# Patient Record
Sex: Female | Born: 1955 | Race: White | Hispanic: No | Marital: Married | Smoking: Current some day smoker
Health system: Southern US, Community
[De-identification: ages and names within clinical notes are randomized; demographics above are authoritative.]

## PROBLEM LIST (undated history)

## (undated) DIAGNOSIS — E785 Hyperlipidemia, unspecified: Secondary | ICD-10-CM

## (undated) DIAGNOSIS — K7460 Unspecified cirrhosis of liver: Secondary | ICD-10-CM

## (undated) DIAGNOSIS — D509 Iron deficiency anemia, unspecified: Secondary | ICD-10-CM

## (undated) DIAGNOSIS — I10 Essential (primary) hypertension: Secondary | ICD-10-CM

## (undated) DIAGNOSIS — D739 Disease of spleen, unspecified: Secondary | ICD-10-CM

## (undated) DIAGNOSIS — K31819 Angiodysplasia of stomach and duodenum without bleeding: Secondary | ICD-10-CM

## (undated) DIAGNOSIS — E119 Type 2 diabetes mellitus without complications: Secondary | ICD-10-CM

## (undated) DIAGNOSIS — G629 Polyneuropathy, unspecified: Secondary | ICD-10-CM

## (undated) DIAGNOSIS — I8500 Esophageal varices without bleeding: Secondary | ICD-10-CM

## (undated) HISTORY — DX: Disease of spleen, unspecified: D73.9

## (undated) HISTORY — DX: Type 2 diabetes mellitus without complications: E11.9

## (undated) HISTORY — DX: Essential (primary) hypertension: I10

## (undated) HISTORY — DX: Esophageal varices without bleeding: I85.00

## (undated) HISTORY — DX: Unspecified cirrhosis of liver: K74.60

## (undated) HISTORY — PX: TRACHEOSTOMY: SUR1362

## (undated) HISTORY — PX: TRACHEOSTOMY CLOSURE: SHX458

## (undated) HISTORY — DX: Angiodysplasia of stomach and duodenum without bleeding: K31.819

## (undated) HISTORY — PX: RECONSTRUCTION OF NOSE: SHX2301

## (undated) HISTORY — DX: Iron deficiency anemia, unspecified: D50.9

---

## 2009-05-17 ENCOUNTER — Emergency Department (HOSPITAL_COMMUNITY): Admission: EM | Admit: 2009-05-17 | Discharge: 2009-05-17 | Payer: Self-pay | Admitting: Emergency Medicine

## 2010-10-22 LAB — POCT I-STAT, CHEM 8
BUN: 10 mg/dL (ref 6–23)
Calcium, Ion: 1.16 mmol/L (ref 1.12–1.32)
Chloride: 101 meq/L (ref 96–112)
Creatinine, Ser: 0.8 mg/dL (ref 0.4–1.2)
Glucose, Bld: 101 mg/dL — ABNORMAL HIGH (ref 70–99)
HCT: 42 % (ref 36.0–46.0)
Hemoglobin: 14.3 g/dL (ref 12.0–15.0)
Potassium: 4.2 mEq/L (ref 3.5–5.1)
Sodium: 137 meq/L (ref 135–145)
TCO2: 29 mmol/L (ref 0–100)

## 2010-10-22 LAB — CBC
HCT: 39.7 % (ref 36.0–46.0)
Hemoglobin: 13.8 g/dL (ref 12.0–15.0)
MCHC: 34.7 g/dL (ref 30.0–36.0)
Platelets: 210 10*3/uL (ref 150–400)
WBC: 9.7 10*3/uL (ref 4.0–10.5)

## 2010-10-22 LAB — POCT CARDIAC MARKERS
CKMB, poc: 2.9 ng/mL (ref 1.0–8.0)
Myoglobin, poc: 67.6 ng/mL (ref 12–200)
Troponin i, poc: <0.05 ng/mL (ref 0.00–0.09)

## 2010-10-22 LAB — D-DIMER, QUANTITATIVE: D-Dimer, Quant: 0.33 ug/mL-FEU (ref 0.00–0.48)

## 2010-10-22 LAB — DIFFERENTIAL: Eosinophils Absolute: 0.3 10*3/uL (ref 0.0–0.7)

## 2013-03-12 ENCOUNTER — Other Ambulatory Visit: Payer: Self-pay | Admitting: Nurse Practitioner

## 2013-03-12 DIAGNOSIS — R1011 Right upper quadrant pain: Secondary | ICD-10-CM

## 2013-03-12 DIAGNOSIS — R748 Abnormal levels of other serum enzymes: Secondary | ICD-10-CM

## 2013-03-20 ENCOUNTER — Ambulatory Visit
Admission: RE | Admit: 2013-03-20 | Discharge: 2013-03-20 | Disposition: A | Discharge status: Home / Self Care | Payer: BC Managed Care – PPO | Source: Ambulatory Visit | Attending: Nurse Practitioner | Admitting: Nurse Practitioner

## 2013-03-20 DIAGNOSIS — R1011 Right upper quadrant pain: Secondary | ICD-10-CM

## 2013-03-20 DIAGNOSIS — R748 Abnormal levels of other serum enzymes: Secondary | ICD-10-CM

## 2014-12-25 ENCOUNTER — Other Ambulatory Visit: Payer: Self-pay | Admitting: Internal Medicine

## 2014-12-25 DIAGNOSIS — Z1231 Encounter for screening mammogram for malignant neoplasm of breast: Secondary | ICD-10-CM

## 2015-01-07 ENCOUNTER — Ambulatory Visit
Admission: RE | Admit: 2015-01-07 | Discharge: 2015-01-07 | Disposition: A | Discharge status: Home / Self Care | Payer: 59 | Source: Ambulatory Visit | Attending: Internal Medicine | Admitting: Internal Medicine

## 2015-01-07 DIAGNOSIS — Z1231 Encounter for screening mammogram for malignant neoplasm of breast: Secondary | ICD-10-CM

## 2018-03-27 ENCOUNTER — Other Ambulatory Visit: Payer: Self-pay | Admitting: Internal Medicine

## 2018-03-27 DIAGNOSIS — Z1231 Encounter for screening mammogram for malignant neoplasm of breast: Secondary | ICD-10-CM

## 2020-08-27 ENCOUNTER — Emergency Department (HOSPITAL_COMMUNITY): Payer: 59

## 2020-08-27 ENCOUNTER — Other Ambulatory Visit: Payer: Self-pay

## 2020-08-27 ENCOUNTER — Emergency Department (HOSPITAL_COMMUNITY)
Admission: EM | Admit: 2020-08-27 | Discharge: 2020-08-27 | Disposition: A | Discharge status: Home / Self Care | Payer: 59 | Source: Home / Self Care

## 2020-08-27 ENCOUNTER — Encounter (HOSPITAL_COMMUNITY): Payer: Self-pay

## 2020-08-27 DIAGNOSIS — R0602 Shortness of breath: Secondary | ICD-10-CM | POA: Insufficient documentation

## 2020-08-27 DIAGNOSIS — Z5321 Procedure and treatment not carried out due to patient leaving prior to being seen by health care provider: Secondary | ICD-10-CM | POA: Insufficient documentation

## 2020-08-27 DIAGNOSIS — R079 Chest pain, unspecified: Secondary | ICD-10-CM | POA: Insufficient documentation

## 2020-08-27 DIAGNOSIS — D735 Infarction of spleen: Secondary | ICD-10-CM | POA: Diagnosis not present

## 2020-08-27 DIAGNOSIS — R1012 Left upper quadrant pain: Secondary | ICD-10-CM | POA: Diagnosis not present

## 2020-08-27 LAB — BASIC METABOLIC PANEL
Anion gap: 14 (ref 5–15)
BUN: 7 mg/dL — ABNORMAL LOW (ref 8–23)
CO2: 24 mmol/L (ref 22–32)
Calcium: 8.9 mg/dL (ref 8.9–10.3)
Chloride: 95 mmol/L — ABNORMAL LOW (ref 98–111)
Creatinine, Ser: 0.63 mg/dL (ref 0.44–1.00)
GFR, Estimated: >60 mL/min (ref >60)
Glucose, Bld: 123 mg/dL — ABNORMAL HIGH (ref 70–99)
Potassium: 4.3 mmol/L (ref 3.5–5.1)
Sodium: 133 mmol/L — ABNORMAL LOW (ref 135–145)

## 2020-08-27 LAB — TROPONIN I (HIGH SENSITIVITY): Troponin I (High Sensitivity): 9 ng/L (ref <18)

## 2020-08-27 LAB — CBC
HCT: 29.2 % — ABNORMAL LOW (ref 36.0–46.0)
Hemoglobin: 8.1 g/dL — ABNORMAL LOW (ref 12.0–15.0)
MCH: 21 pg — ABNORMAL LOW (ref 26.0–34.0)
MCHC: 27.7 g/dL — ABNORMAL LOW (ref 30.0–36.0)
MCV: 75.6 fL — ABNORMAL LOW (ref 80.0–100.0)
Platelets: 196 10*3/uL (ref 150–400)
RBC: 3.86 MIL/uL — ABNORMAL LOW (ref 3.87–5.11)
RDW: 18.7 % — ABNORMAL HIGH (ref 11.5–15.5)
WBC: 11.4 10*3/uL — ABNORMAL HIGH (ref 4.0–10.5)
nRBC: 0 % (ref 0.0–0.2)

## 2020-08-27 NOTE — ED Triage Notes (Signed)
Pt reports chest pain under her left breast and sob for the past 2 days. Tested negative for COVID and is fully vaccinated.  Resp e.u

## 2020-08-28 ENCOUNTER — Other Ambulatory Visit: Payer: Self-pay

## 2020-08-28 ENCOUNTER — Encounter (HOSPITAL_COMMUNITY): Payer: Self-pay | Admitting: Emergency Medicine

## 2020-08-28 ENCOUNTER — Ambulatory Visit (HOSPITAL_COMMUNITY)
Admission: EM | Admit: 2020-08-28 | Discharge: 2020-08-28 | Disposition: A | Discharge status: Home / Self Care | Payer: 59

## 2020-08-28 ENCOUNTER — Inpatient Hospital Stay (HOSPITAL_COMMUNITY)
Admission: EM | Admit: 2020-08-28 | Discharge: 2020-08-31 | DRG: 814 | Disposition: A | Discharge status: Home / Self Care | Payer: 59 | Attending: Internal Medicine | Admitting: Internal Medicine

## 2020-08-28 ENCOUNTER — Emergency Department (HOSPITAL_COMMUNITY): Payer: 59

## 2020-08-28 DIAGNOSIS — R1010 Upper abdominal pain, unspecified: Secondary | ICD-10-CM | POA: Diagnosis not present

## 2020-08-28 DIAGNOSIS — D735 Infarction of spleen: Principal | ICD-10-CM | POA: Diagnosis present

## 2020-08-28 DIAGNOSIS — R600 Localized edema: Secondary | ICD-10-CM | POA: Diagnosis present

## 2020-08-28 DIAGNOSIS — I8510 Secondary esophageal varices without bleeding: Secondary | ICD-10-CM | POA: Diagnosis present

## 2020-08-28 DIAGNOSIS — Z6837 Body mass index (BMI) 37.0-37.9, adult: Secondary | ICD-10-CM

## 2020-08-28 DIAGNOSIS — E669 Obesity, unspecified: Secondary | ICD-10-CM | POA: Diagnosis present

## 2020-08-28 DIAGNOSIS — E785 Hyperlipidemia, unspecified: Secondary | ICD-10-CM | POA: Diagnosis present

## 2020-08-28 DIAGNOSIS — Z20822 Contact with and (suspected) exposure to covid-19: Secondary | ICD-10-CM | POA: Diagnosis present

## 2020-08-28 DIAGNOSIS — R1084 Generalized abdominal pain: Secondary | ICD-10-CM | POA: Diagnosis not present

## 2020-08-28 DIAGNOSIS — K7030 Alcoholic cirrhosis of liver without ascites: Secondary | ICD-10-CM | POA: Diagnosis not present

## 2020-08-28 DIAGNOSIS — Z79899 Other long term (current) drug therapy: Secondary | ICD-10-CM

## 2020-08-28 DIAGNOSIS — R109 Unspecified abdominal pain: Secondary | ICD-10-CM

## 2020-08-28 DIAGNOSIS — J9601 Acute respiratory failure with hypoxia: Secondary | ICD-10-CM | POA: Diagnosis present

## 2020-08-28 DIAGNOSIS — G629 Polyneuropathy, unspecified: Secondary | ICD-10-CM | POA: Diagnosis present

## 2020-08-28 DIAGNOSIS — R0602 Shortness of breath: Secondary | ICD-10-CM

## 2020-08-28 DIAGNOSIS — M79662 Pain in left lower leg: Secondary | ICD-10-CM | POA: Diagnosis present

## 2020-08-28 DIAGNOSIS — D509 Iron deficiency anemia, unspecified: Secondary | ICD-10-CM | POA: Diagnosis present

## 2020-08-28 DIAGNOSIS — F17210 Nicotine dependence, cigarettes, uncomplicated: Secondary | ICD-10-CM | POA: Diagnosis present

## 2020-08-28 DIAGNOSIS — I10 Essential (primary) hypertension: Secondary | ICD-10-CM | POA: Diagnosis present

## 2020-08-28 DIAGNOSIS — R911 Solitary pulmonary nodule: Secondary | ICD-10-CM | POA: Diagnosis present

## 2020-08-28 DIAGNOSIS — K743 Primary biliary cirrhosis: Secondary | ICD-10-CM

## 2020-08-28 DIAGNOSIS — M1990 Unspecified osteoarthritis, unspecified site: Secondary | ICD-10-CM | POA: Diagnosis present

## 2020-08-28 DIAGNOSIS — K7290 Hepatic failure, unspecified without coma: Secondary | ICD-10-CM

## 2020-08-28 DIAGNOSIS — R1012 Left upper quadrant pain: Secondary | ICD-10-CM

## 2020-08-28 DIAGNOSIS — D500 Iron deficiency anemia secondary to blood loss (chronic): Secondary | ICD-10-CM | POA: Diagnosis not present

## 2020-08-28 DIAGNOSIS — K7460 Unspecified cirrhosis of liver: Secondary | ICD-10-CM | POA: Diagnosis present

## 2020-08-28 DIAGNOSIS — E8770 Fluid overload, unspecified: Secondary | ICD-10-CM | POA: Diagnosis present

## 2020-08-28 DIAGNOSIS — D649 Anemia, unspecified: Secondary | ICD-10-CM

## 2020-08-28 DIAGNOSIS — R52 Pain, unspecified: Secondary | ICD-10-CM | POA: Diagnosis not present

## 2020-08-28 HISTORY — DX: Hyperlipidemia, unspecified: E78.5

## 2020-08-28 HISTORY — DX: Polyneuropathy, unspecified: G62.9

## 2020-08-28 LAB — IRON AND TIBC
Iron: 20 ug/dL — ABNORMAL LOW (ref 28–170)
Saturation Ratios: 4 % — ABNORMAL LOW (ref 10.4–31.8)
TIBC: 466 ug/dL — ABNORMAL HIGH (ref 250–450)
UIBC: 446 ug/dL

## 2020-08-28 LAB — COMPREHENSIVE METABOLIC PANEL
ALT: 26 U/L (ref 0–44)
AST: 31 U/L (ref 15–41)
Albumin: 3.3 g/dL — ABNORMAL LOW (ref 3.5–5.0)
Alkaline Phosphatase: 148 U/L — ABNORMAL HIGH (ref 38–126)
Anion gap: 12 (ref 5–15)
BUN: 9 mg/dL (ref 8–23)
CO2: 26 mmol/L (ref 22–32)
Calcium: 9.2 mg/dL (ref 8.9–10.3)
Chloride: 98 mmol/L (ref 98–111)
Creatinine, Ser: 0.65 mg/dL (ref 0.44–1.00)
GFR, Estimated: >60 mL/min (ref >60)
Glucose, Bld: 141 mg/dL — ABNORMAL HIGH (ref 70–99)
Potassium: 3.8 mmol/L (ref 3.5–5.1)
Sodium: 136 mmol/L (ref 135–145)
Total Bilirubin: 0.8 mg/dL (ref 0.3–1.2)
Total Protein: 7 g/dL (ref 6.5–8.1)

## 2020-08-28 LAB — FERRITIN: Ferritin: 8 ng/mL — ABNORMAL LOW (ref 11–307)

## 2020-08-28 LAB — RETICULOCYTES
Immature Retic Fract: 31 % — ABNORMAL HIGH (ref 2.3–15.9)
RBC.: 3.83 MIL/uL — ABNORMAL LOW (ref 3.87–5.11)
Retic Count, Absolute: 125.8 10*3/uL (ref 19.0–186.0)
Retic Ct Pct: 3.3 % — ABNORMAL HIGH (ref 0.4–3.1)

## 2020-08-28 LAB — MONONUCLEOSIS SCREEN: Mono Screen: NEGATIVE

## 2020-08-28 LAB — FOLATE: Folate: 31.2 ng/mL (ref >5.9)

## 2020-08-28 LAB — RESP PANEL BY RT-PCR (FLU A&B, COVID) ARPGX2
Influenza A by PCR: NEGATIVE
Influenza B by PCR: NEGATIVE
SARS Coronavirus 2 by RT PCR: NEGATIVE

## 2020-08-28 LAB — CBC
HCT: 29.6 % — ABNORMAL LOW (ref 36.0–46.0)
Hemoglobin: 8 g/dL — ABNORMAL LOW (ref 12.0–15.0)
MCH: 20.7 pg — ABNORMAL LOW (ref 26.0–34.0)
MCHC: 27 g/dL — ABNORMAL LOW (ref 30.0–36.0)
MCV: 76.5 fL — ABNORMAL LOW (ref 80.0–100.0)
Platelets: 174 10*3/uL (ref 150–400)
RBC: 3.87 MIL/uL (ref 3.87–5.11)
RDW: 18.7 % — ABNORMAL HIGH (ref 11.5–15.5)
WBC: 8.6 10*3/uL (ref 4.0–10.5)
nRBC: 0 % (ref 0.0–0.2)

## 2020-08-28 LAB — VITAMIN B12: Vitamin B-12: 876 pg/mL (ref 180–914)

## 2020-08-28 LAB — TROPONIN I (HIGH SENSITIVITY)
Troponin I (High Sensitivity): 9 ng/L (ref <18)
Troponin I (High Sensitivity): 9 ng/L (ref <18)

## 2020-08-28 LAB — D-DIMER, QUANTITATIVE: D-Dimer, Quant: 2.43 ug/mL-FEU — ABNORMAL HIGH (ref 0.00–0.50)

## 2020-08-28 LAB — POC OCCULT BLOOD, ED: Fecal Occult Bld: NEGATIVE

## 2020-08-28 LAB — ABO/RH: ABO/RH(D): O POS

## 2020-08-28 LAB — BRAIN NATRIURETIC PEPTIDE: B Natriuretic Peptide: 57.4 pg/mL (ref 0.0–100.0)

## 2020-08-28 MED ORDER — IOHEXOL 300 MG/ML  SOLN
100.0000 mL | Freq: Once | INTRAMUSCULAR | Status: AC | PRN
  Administered 2020-08-28: 100 mL via INTRAVENOUS

## 2020-08-28 MED ORDER — IOHEXOL 350 MG/ML SOLN
50.0000 mL | Freq: Once | INTRAVENOUS | Status: AC | PRN
  Administered 2020-08-28: 50 mL via INTRAVENOUS

## 2020-08-28 MED ORDER — MORPHINE SULFATE (PF) 2 MG/ML IV SOLN
2.0000 mg | Freq: Once | INTRAVENOUS | Status: AC
Start: 2020-08-28 — End: 2020-08-28
  Administered 2020-08-28: 2 mg via INTRAVENOUS
  Filled 2020-08-28: qty 1

## 2020-08-28 NOTE — ED Triage Notes (Addendum)
Patient sent from Urgent Care for evaluation of possible GI bleed. Hgb 8.1 a few days ago. Patient also complains of lower extremity swelling. Patient denies having red stools and black tarry stools.

## 2020-08-28 NOTE — Discharge Instructions (Signed)
Please report to the emergency room now as I am concerned that you are having a severe gastrointestinal bleed causing a dramatic drop in your hemoglobin and hematocrit, shortness of breath and severe abdominal pain.

## 2020-08-28 NOTE — ED Triage Notes (Signed)
Pt presents with left side pain and pain under left breast xs 3-4 days. States also SOB that has been going on for " a couple of months"   Went to ED yesterday but LWBS.

## 2020-08-28 NOTE — ED Provider Notes (Signed)
[facility] EMERGENCY DEPARTMENT Provider Note   CSN: [id number] Arrival date & time: 08/28/20  1003     History Chief Complaint  Patient presents with  . Abdominal Pain    Sierra Logan is a 65 y.o. female.  HPI   Patient with significant medical history of PBC, esophagus varices, peptic duodenitis, lower limb edema presents with complaint of left upper quadrant pain and left chest pain.  Patient states symptoms started on Monday, and has stayed consistent.  She describes  intermittent pain which originally started in her left upper quadrant then radiated up into her left shoulder, she states the pain comes and goes, is worsened with movement and breathing, she denies becoming diaphoretic, nauseous, vomiting, lightheaded or dizziness.  Patient has no cardiac history, no history of DVTs or PEs, currently not on hormone therapy.  Abdominal pain is not associated with nausea or vomiting, no urinary symptoms, no constipation but does admit to diarrhea without dark tarry stools or blood in her stool.  Patient has no significant abdominal history. patient denies  alleviating factors.  Patient has headaches, fevers, chills, shortness of breath, nausea, vomiting, diarrhea, pedal edema.  Past Medical History:  Diagnosis Date  . Hyperlipemia   . Neuropathy     Patient Active Problem List   Diagnosis Date Noted  . Iron deficiency anemia 08/28/2020  . 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 Abdominal pain 08/28/2020  . Peripheral neuropathy 08/28/2020  . Cirrhosis of liver ([place]) 08/28/2020  . Lung nodule 08/28/2020    History reviewed. No pertinent surgical history.   OB History   No obstetric history on file.     History reviewed. No pertinent family history.  Social History   Tobacco Use  . Smoking status: Current Every Day Smoker  . Smokeless tobacco: Never Used  Substance Use Topics  . Alcohol use: Not Currently    Home Medications Prior to Admission  medications   Medication Sig Start Date End Date Taking? Authorizing Provider  acetaminophen (TYLENOL) 500 MG tablet Take 500-1,000 mg by mouth every 8 (eight) hours as needed for mild pain or headache.   Yes [provider]  atorvastatin (LIPITOR) 10 MG tablet Take 10 mg by mouth at bedtime. 07/16/20  Yes [provider]  BIOTIN PO Take 1 tablet by mouth daily with breakfast.   Yes [provider]  Calcium Carb-Cholecalciferol (CALCIUM+D3 PO) Take 1 tablet by mouth daily after breakfast.   Yes [provider]  furosemide (LASIX) 20 MG tablet Take 20 mg by mouth 2 (two) times daily. 08/13/20  Yes [provider]  gabapentin (NEURONTIN) 400 MG capsule Take 400-1,200 mg by mouth See admin instructions. Take 400 mg by mouth in the morning and 1,200 mg at bedtime 06/16/20  Yes [provider]  Multiple Vitamins-Calcium (ONE-A-DAY WOMENS FORMULA PO) Take 1 tablet by mouth daily with breakfast.   Yes [provider]  Omega-3 Fatty Acids (FISH OIL PO) Take 1 capsule by mouth daily with breakfast.   Yes [provider]  VITAMIN E PO Take 1 capsule by mouth daily with breakfast.   Yes [provider]    Allergies    Patient has no known allergies.  Review of Systems   Review of Systems  Constitutional: Negative for chills and fever.  HENT: Negative for congestion and sore throat.   Eyes: Negative for visual disturbance.  Respiratory: Positive for shortness of breath. Negative for cough.   Cardiovascular: Positive for chest pain.  Gastrointestinal: Positive for abdominal pain and diarrhea. Negative for blood in stool, nausea and vomiting.  Genitourinary: Negative for difficulty urinating, enuresis, vaginal bleeding and vaginal discharge.  Musculoskeletal: Negative for back pain.  Skin: Negative for rash.  Neurological: Negative for dizziness and headaches.  Hematological: Does not bruise/bleed easily.    Physical  Exam Updated Vital Signs BP (!) 142/47   Pulse (!) 101   Temp 97.9 F (36.6 C)   Resp 17   Ht $R'5\' 2"'Mc$  (1.575 m)   Wt 81.6 kg   SpO2 94%   BMI 32.92 kg/m   Physical Exam Vitals and nursing note reviewed.  Constitutional:      General: She is not in acute distress.    Appearance: She is not ill-appearing.  HENT:     Head: Normocephalic and atraumatic.     Nose: No congestion.  Eyes:     Conjunctiva/sclera: Conjunctivae normal.  Cardiovascular:     Rate and Rhythm: Normal rate and regular rhythm.     Pulses: Normal pulses.     Heart sounds: No murmur heard. No friction rub. No gallop.   Pulmonary:     Effort: No respiratory distress.     Breath sounds: No wheezing, rhonchi or rales.  Abdominal:     General: There is distension.     Palpations: Abdomen is soft.     Tenderness: There is abdominal tenderness. There is no right CVA tenderness or left CVA tenderness.     Comments: Patient abdomen is distended, normoactive bowel sounds, dull to percussion.  Patient had general abdominal tenderness, there is no rebound tenderness, no Murphy sign or McBurney point, no peritoneal sign present.  Negative CVA tenderness.  Musculoskeletal:     Right lower leg: Edema present.     Left lower leg: Edema present.     Comments: Patient has 1+ pitting edema to the shins.  Skin:    General: Skin is warm and dry.     Comments: Patient's lower extremities were visualized there is no lymph edema skin changes, no ulcers,  neurovascular fully intact  Neurological:     Mental Status: She is alert.  Psychiatric:        Mood and Affect: Mood normal.     ED Results / Procedures / Treatments   Labs (all labs ordered are listed, but only abnormal results are displayed) Labs Reviewed  COMPREHENSIVE METABOLIC PANEL - Abnormal; Notable for the following components:      Result Value   Glucose, Bld 141 (*)    Albumin 3.3 (*)    Alkaline Phosphatase 148 (*)    All other components within normal  limits  CBC - Abnormal; Notable for the following components:   Hemoglobin 8.0 (*)    HCT 29.6 (*)    MCV 76.5 (*)    MCH 20.7 (*)    MCHC 27.0 (*)    RDW 18.7 (*)    All other components within normal limits  IRON AND TIBC - Abnormal; Notable for the following components:   Iron 20 (*)    TIBC 466 (*)    Saturation Ratios 4 (*)    All other components within normal limits  FERRITIN - Abnormal; Notable for the following components:   Ferritin 8 (*)    All other components within normal limits  RETICULOCYTES - Abnormal; Notable for the following components:   Retic Ct Pct 3.3 (*)    RBC. 3.83 (*)    Immature Retic Fract 31.0 (*)  All other components within normal limits  D-DIMER, QUANTITATIVE (NOT AT [facility]) - Abnormal; Notable for the following components:   D-Dimer, Quant 2.43 (*)    All other components within normal limits  RESP PANEL BY RT-PCR (FLU A&B, COVID) ARPGX2  VITAMIN B12  FOLATE  BRAIN NATRIURETIC PEPTIDE  MONONUCLEOSIS SCREEN  HEPATITIS PANEL, ACUTE  URINALYSIS, ROUTINE W REFLEX MICROSCOPIC  PROTIME-INR  APTT  POC OCCULT BLOOD, ED  POC OCCULT BLOOD, ED  TYPE AND SCREEN  ABO/RH  TROPONIN I (HIGH SENSITIVITY)  TROPONIN I (HIGH SENSITIVITY)    EKG EKG Interpretation  Date/Time:  Thursday August 28 2020 10:13:39 EST Ventricular Rate:  96 PR Interval:  140 QRS Duration: 82 QT Interval:  374 QTC Calculation: 472 R Axis:   60 Text Interpretation: Normal sinus rhythm Nonspecific ST abnormality Confirmed by Lajean Saver [phone]) on 08/28/2020 5:02:46 PM   Radiology DG Chest 2 View  Result Date: 08/27/2020 CLINICAL DATA:  Chest pain EXAM: CHEST - 2 VIEW COMPARISON:  05/17/2009 FINDINGS: There is mild bibasilar atelectasis. No superimposed focal pulmonary infiltrate. No pneumothorax or pleural effusion. Cardiac size within normal limits. Pulmonary vascularity is normal. No acute bone abnormality. Subacute to remote left ninth rib fracture is identified  laterally. IMPRESSION: No active cardiopulmonary disease. Electronically Signed   By: Fidela Salisbury MD   On: 08/27/2020 13:19   CT Angio Chest PE W and/or Wo Contrast  Result Date: 08/28/2020 CLINICAL DATA:  65 year old female with elevated D-dimer. Concern for pulmonary embolism. EXAM: CT ANGIOGRAPHY CHEST WITH CONTRAST TECHNIQUE: Multidetector CT imaging of the chest was performed using the standard protocol during bolus administration of intravenous contrast. Multiplanar CT image reconstructions and MIPs were obtained to evaluate the vascular anatomy. CONTRAST:  67mL OMNIPAQUE IOHEXOL 350 MG/ML SOLN COMPARISON:  Chest radiograph dated 08/27/2020. FINDINGS: Cardiovascular: There is no cardiomegaly or pericardial effusion. Three-vessel coronary vascular calcification. There is calcification of the mitral annulus. Mild atherosclerotic calcification of the thoracic aorta. No aneurysmal dilatation or dissection. The origins of the great vessels of the aortic arch appear patent as visualized. Evaluation of the pulmonary arteries is limited due to respiratory motion artifact and suboptimal opacification and timing of the contrast. No definite large or central pulmonary artery embolus identified. Mediastinum/Nodes: Mild enlargement of bilateral hilar lymph nodes measuring up to 13 mm in short axis on the right. Subcarinal lymph node measures approximately 11 mm. The esophagus is grossly unremarkable. No mediastinal fluid collection. Lungs/Pleura: Minimal bibasilar dependent atelectasis. There is a 6 mm nodule in the superior segment of the right lower lobe (56/6). No focal consolidation, pleural effusion or pneumothorax. The central airways are patent. Upper Abdomen: Cirrhosis. A 3.4 x 2.7 cm ill-defined low attenuating area in the anterior spleen is not characterized but may represent a mass or a focal area of infarct. Musculoskeletal: Degenerative changes of the spine. No acute osseous pathology. Review of the MIP  images confirms the above findings. IMPRESSION: 1. No acute intrathoracic pathology. No CT evidence of central pulmonary artery embolus. 2. Cirrhosis. 3. A 3.4 x 2.7 cm ill-defined low attenuating area in the anterior spleen may represent a mass or a focal area of infarct. 4. There is a 6 mm nodule in the superior segment of the right lower lobe. Non-contrast chest CT at 6-12 months is recommended. If the nodule is stable at time of repeat CT, then future CT at 18-24 months (from today's scan) is considered optional for low-risk patients, but is recommended for high-risk patients. This  recommendation follows the consensus statement: Guidelines for Management of Incidental Pulmonary Nodules Detected on CT Images: From the Fleischner Society 2017; Radiology 2017; 284:228-243. 5. Aortic Atherosclerosis (ICD10-I70.0). Electronically Signed   By: Anner Crete M.D.   On: 08/28/2020 21:59   CT Abdomen Pelvis W Contrast  Result Date: 08/28/2020 CLINICAL DATA:  Left-sided abdominal pain over the last 3-4 days. Shortness of breath. EXAM: CT ABDOMEN AND PELVIS WITH CONTRAST TECHNIQUE: Multidetector CT imaging of the abdomen and pelvis was performed using the standard protocol following bolus administration of intravenous contrast. CONTRAST:  133mL OMNIPAQUE IOHEXOL 300 MG/ML  SOLN COMPARISON:  Ultrasound 03/20/2013 FINDINGS: Lower chest: Coronary artery calcification is noted. Globular calcification in the region of the mitral valve annulus. Heart size is normal. No pericardial effusion. There is a cluster of prominent lymph nodes the right of the heart in the pericardial fat, the largest node measuring 14 x 17 mm. There is mild atelectasis and or scarring at the left lung base and lingula. Hepatobiliary: Pronounced cirrhosis of the liver. No evidence of a mass lesion. No calcified gallstones. Pancreas: Normal Spleen: Splenomegaly. 4-5 cm hypoperfused area in the anterior superior corner of the spleen presumably  relating to a splenic infarction. This could explain the clinical presentation. Adrenals/Urinary Tract: Adrenal glands are normal. Kidneys are normal. Bladder is normal. Stomach/Bowel: The stomach and small intestine are normal. The appendix is normal. Diverticulosis of the left colon without evidence of diverticulitis. Vascular/Lymphatic: Aortic atherosclerosis. Maximal diameter of the infrarenal abdominal aorta measures 3.2 cm. The IVC is normal. Mild nodal prominence throughout the retroperitoneum and in the porta hepatis. Reproductive: No pelvic mass. Other: No free fluid or air. Musculoskeletal: Chronic degenerative changes at the L5-S1 disc space. IMPRESSION: 1. Advanced cirrhosis of the liver. Splenomegaly. 4-5 cm hypoperfused area in the anterior superior corner of the spleen presumably relating to a splenic infarction. This could explain the clinical presentation. 2. Aortic atherosclerosis. Maximal diameter of the infrarenal abdominal aorta measures 3.2 cm. Recommend follow-up ultrasound every 3 years. This recommendation follows ACR consensus guidelines: White Paper of the ACR Incidental Findings Committee II on Vascular Findings. J Am Coll Radiol 2013; 10:789-794. 3. Nodal prominence in the pericardial station, the upper abdomen and the retroperitoneum, likely secondary to lymphatic congestion associated with cirrhosis. Aortic Atherosclerosis (ICD10-I70.0). Electronically Signed   By: Nelson Chimes M.D.   On: 08/28/2020 20:11    Procedures Procedures   Medications Ordered in ED Medications  iohexol (OMNIPAQUE) 300 MG/ML solution 100 mL (100 mLs Intravenous Contrast Given 08/28/20 1957)  morphine 2 MG/ML injection 2 mg (2 mg Intravenous Given 08/28/20 2115)  iohexol (OMNIPAQUE) 350 MG/ML injection 50 mL (50 mLs Intravenous Contrast Given 08/28/20 2150)    ED Course  I have reviewed the triage vital signs and the nursing notes.  Pertinent labs & imaging results that were available during my care  of the patient were reviewed by me and considered in my medical decision making (see chart for details).    MDM Rules/Calculators/A&P                          Initial impression-patient presents with left upper quadrant pain left chest pain.  She is alert, does not appear in acute stress, vital signs reassuring.  Will obtain chest pain work-up, add on anemia panel, CT abdomen pelvis for further evaluation.  Work-up-CBC negative for leukocytosis, shows normocytic anemia at 8 decreased from baseline of 14, 11 months ago.  CMP shows hyperglycemia 141, elevated alk phos of 148, no anion gap present.  BMP 57.4, D-dimer elevated 2.43.  Hemoccult negative. CT abdomen pelvis shows advanced cirrhosis of the liver, splenomegaly there is a 4.5 cm hypodensity presumably splenic infarction.  Aortic arteriosclerosis measuring 3.2 cm recommends follow-up in 3 years for reevaluation.  EKG sinus without signs of ischemia no ST elevation depression noted.  CT angio negative for PE, does show mass in the anterior aspect of the spleen may represent mass or area of infarct.  Also shows 6 mm nodule in the superior segment of the right lung, recommends noncontrast CT next 6 to 12 months for reevaluation.  Consult due to acute splenic infarction of unclear etiology and worsening anemia will consult with hospitalist for further recommendations.  Spoke with Dr. Tonie Griffith of the hospitalist team he has agreed to accept the patient.  He will come down and assess them.  Spoke with oncology spoke with Dr. Lorenso Courier who recommends CT angio of abdomen for further evaluation, his team will come and evaluate the patient tomorrow.  Reassessment patient was reassessed, continues to have pain wwill provide her with morphine at this time.  Notify patient of findings, recommend admission for further evaluation.  Patient was agreeable to this.  Due to elevated D-dimer concern for possible PE will order CT angio for further evaluation.  Patient  has no pain at this time, vital signs remained stable.  Patient is agreeable for hospital admission.  Rule out-I have low suspicion for ACS as history is atypical, patient has no cardiac history, EKG sinus without signs of ischemia, negative delta troponins.  Low suspicion for PE as CT angio was negative.  Low suspicion for perforated stomach ulcer as abdomen is nontympanic, no peritoneal sign noted on exam, CT imaging negative for pneumoperitoneum.  Low suspicion for gallbladder abnormality  no elevation in liver enzymes, no acute findings seen on CT abdomen pelvis.  Low suspicion for UTI or pyelonephritis as patient denies urinary symptoms, no CVA tenderness on exam, no acute findings seen on CT abdomen pelvis.  Low suspicion for lower GI bleed has Hemoccult is negative.  Plan-patient has splenic infarction with unknown etiology anticipate patient will need further work-up as it is possible patient may have mesenteric infarction and need CT angio of the abdomen pelvis.  Patient care will be transferred to admitting team for further evaluation.     Final Clinical Impression(s) / ED Diagnoses Final diagnoses:  Splenic infarction  Left upper quadrant abdominal pain  Iron deficiency anemia, unspecified iron deficiency anemia type    Rx / DC Orders ED Discharge Orders    None       Marcello Fennel, PA-C 08/29/20 0002    Tegeler, Gwenyth Allegra, MD 09/02/20 0009

## 2020-08-28 NOTE — ED Notes (Signed)
Pt transported to radiology at this time °

## 2020-08-28 NOTE — ED Notes (Signed)
Pt back from CT

## 2020-08-28 NOTE — H&P (Signed)
History and Physical    Sierra Logan QQV:[id number] DOB: 05-11-1956 DOA: 08/28/2020  PCP: Patient, No Pcp Per   Patient coming from: Home  Chief Complaint: Abdominal pain  HPI: Sierra Logan is a 65 y.o. female with medical history significant for cirrhosis, esophagus varices, peptic duodenitis, lower limb edema presents with complaint of left upper quadrant pain and left chest pain.  Patient states symptoms started 4 days ago. Pain has been consistent and unchanging over the past few days. She describes  intermittent pain which originally started in her left upper quadrant then radiated up into her left shoulder, she states the pain comes and goes and is worsened with movement and deep breathing. She denies becoming diaphoretic, nauseous, vomiting, lightheaded or dizziness.  Patient has no cardiac history, no history of DVTs or Pes and is not on hormone therapy. She does smoke but has decreased from a pack a day to 6-7 cigarettes a day for the past week. Abdominal pain is not associated with nausea or vomiting, no urinary symptoms, no constipation.  She reports she has had 3 colonoscopies in her life with the last being 2 years ago in which a few benign polyps were removed.  She states she has been told she does not need to have another colonoscopy for 3 more years. Patient has not had abdominal injury or trauma.  Patient has chronic pedal edema and takes lasix. She has not had change in her medications lately. She reports she took the J&J Covid vaccine 6 months ago. She reports having low energy and gets SOB with exertion for the past year which has slowly gotten more pronounced.    Review of Systems:  General: Denies weakness, fever, chills, weight loss.  Denies dizziness.  Denies change in appetite HENT: Denies head trauma, headache, denies change in hearing, tinnitus.  Denies nasal congestion or bleeding.  Denies sore throat, sores in mouth.  Denies difficulty swallowing Eyes: Denies blurry  vision, pain in eye, drainage.  Denies discoloration of eyes. Neck: Denies pain.  Denies swelling.  Denies pain with movement. Cardiovascular: Denies chest pain, palpitations.  Denies edema.  Denies orthopnea Respiratory: Denies shortness of breath, cough.  Denies wheezing.  Denies sputum production Gastrointestinal: Reports abdominal pain. Denies nausea, vomiting, diarrhea.  Denies melena.  Denies hematemesis. Musculoskeletal: Denies limitation of movement.  Denies deformity or swelling. Denies arthralgias or myalgias. Genitourinary: Denies pelvic pain.  Denies urinary frequency or hesitancy.  Denies dysuria.  Skin: Denies rash.  Denies petechiae, purpura, ecchymosis. Neurological: Denies headache.  Denies syncope.  Denies seizure activity.  Denies weakness or paresthesia.  Denies slurred speech, drooping face.  Denies visual change. Psychiatric: Denies depression, anxiety.  Denies hallucinations.  Past Medical History:  Diagnosis Date  . Hyperlipemia   . Neuropathy     History reviewed. No pertinent surgical history.  Social History  reports that she has been smoking. She has never used smokeless tobacco. She reports previous alcohol use. No history on file for drug use.  No Known Allergies  History reviewed. No pertinent family history.   Prior to Admission medications   Medication Sig Start Date End Date Taking? Authorizing Provider  acetaminophen (TYLENOL) 500 MG tablet Take 500-1,000 mg by mouth every 8 (eight) hours as needed for mild pain or headache.   Yes [provider]  atorvastatin (LIPITOR) 10 MG tablet Take 10 mg by mouth at bedtime. 07/16/20  Yes [provider]  BIOTIN PO Take 1 tablet by mouth daily with  breakfast.   Yes [provider]  Calcium Carb-Cholecalciferol (CALCIUM+D3 PO) Take 1 tablet by mouth daily after breakfast.   Yes [provider]  furosemide (LASIX) 20 MG tablet Take 20 mg by mouth 2 (two) times daily. 08/13/20   Yes [provider]  gabapentin (NEURONTIN) 400 MG capsule Take 400-1,200 mg by mouth See admin instructions. Take 400 mg by mouth in the morning and 1,200 mg at bedtime 06/16/20  Yes [provider]  Multiple Vitamins-Calcium (ONE-A-DAY WOMENS FORMULA PO) Take 1 tablet by mouth daily with breakfast.   Yes [provider]  Omega-3 Fatty Acids (FISH OIL PO) Take 1 capsule by mouth daily with breakfast.   Yes [provider]  VITAMIN E PO Take 1 capsule by mouth daily with breakfast.   Yes [provider]    Physical Exam: Vitals:   08/28/20 1900 08/28/20 1931 08/28/20 2100 08/28/20 2115  BP: (!) 132/51  (!) 143/70 (!) 142/47  Pulse: 87  (!) 102 (!) 101  Resp: (!) 21  17 17   Temp:  98 F (36.7 C) 97.9 F (36.6 C)   TempSrc:  Oral    SpO2: 98%  96% 94%  Weight:      Height:        Constitutional: NAD, calm, comfortable Vitals:   08/28/20 1900 08/28/20 1931 08/28/20 2100 08/28/20 2115  BP: (!) 132/51  (!) 143/70 (!) 142/47  Pulse: 87  (!) 102 (!) 101  Resp: (!) 21  17 17   Temp:  98 F (36.7 C) 97.9 F (36.6 C)   TempSrc:  Oral    SpO2: 98%  96% 94%  Weight:      Height:       General: WDWN, Alert and oriented x3.  Eyes: EOMI, PERRL, conjunctivae normal.  Sclera nonicteric HENT:  /AT, external ears normal.  Nares patent without epistasis.  Mucous membranes are moist. Neck: Soft, normal range of motion, supple, no masses, no thyromegaly. Trachea midline Respiratory: clear to auscultation bilaterally, no wheezing, no crackles. Normal respiratory effort. No accessory muscle use.  Cardiovascular: Regular rate and rhythm, no murmurs / rubs / gallops. Mild pedal edema. 1+ pedal pulses. Abdomen: Soft, LUQ and central abdominal tenderness, nondistended, no rebound or guarding.  No masses palpated. Obese. Bowel sounds normoactive Musculoskeletal: FROM. no cyanosis. No joint deformity upper and lower extremities. Normal muscle tone.  Skin:  Warm, dry, intact no rashes, lesions, ulcers. No induration.  Neurologic:  CN 2-12 grossly intact. Normal speech. Normal sensation to touch in extremities. Strength 5/5 in all extremities.   Psychiatric: Normal judgment and insight.  Normal mood.    Labs on Admission: I have personally reviewed following labs and imaging studies  CBC: Recent Labs  Lab 08/27/20 1344 08/28/20 1033  WBC 11.4* 8.6  HGB 8.1* 8.0*  HCT 29.2* 29.6*  MCV 75.6* 76.5*  PLT 196 035    Basic Metabolic Panel: Recent Labs  Lab 08/27/20 1344 08/28/20 1033  NA 133* 136  K 4.3 3.8  CL 95* 98  CO2 24 26  GLUCOSE 123* 141*  BUN 7* 9  CREATININE 0.63 0.65  CALCIUM 8.9 9.2    GFR: Estimated Creatinine Clearance: 70.3 mL/min (by C-G formula based on SCr of 0.65 mg/dL).  Liver Function Tests: Recent Labs  Lab 08/28/20 1033  AST 31  ALT 26  ALKPHOS 148*  BILITOT 0.8  PROT 7.0  ALBUMIN 3.3*    Urine analysis: No results found for: COLORURINE, APPEARANCEUR, LABSPEC,  PHURINE, GLUCOSEU, HGBUR, BILIRUBINUR, KETONESUR, PROTEINUR, UROBILINOGEN, NITRITE, LEUKOCYTESUR  Radiological Exams on Admission: DG Chest 2 View  Result Date: 08/27/2020 CLINICAL DATA:  Chest pain EXAM: CHEST - 2 VIEW COMPARISON:  05/17/2009 FINDINGS: There is mild bibasilar atelectasis. No superimposed focal pulmonary infiltrate. No pneumothorax or pleural effusion. Cardiac size within normal limits. Pulmonary vascularity is normal. No acute bone abnormality. Subacute to remote left ninth rib fracture is identified laterally. IMPRESSION: No active cardiopulmonary disease. Electronically Signed   By: Fidela Salisbury MD   On: 08/27/2020 13:19   CT Angio Chest PE W and/or Wo Contrast  Result Date: 08/28/2020 CLINICAL DATA:  65 year old female with elevated D-dimer. Concern for pulmonary embolism. EXAM: CT ANGIOGRAPHY CHEST WITH CONTRAST TECHNIQUE: Multidetector CT imaging of the chest was performed using the standard protocol during bolus  administration of intravenous contrast. Multiplanar CT image reconstructions and MIPs were obtained to evaluate the vascular anatomy. CONTRAST:  62mL OMNIPAQUE IOHEXOL 350 MG/ML SOLN COMPARISON:  Chest radiograph dated 08/27/2020. FINDINGS: Cardiovascular: There is no cardiomegaly or pericardial effusion. Three-vessel coronary vascular calcification. There is calcification of the mitral annulus. Mild atherosclerotic calcification of the thoracic aorta. No aneurysmal dilatation or dissection. The origins of the great vessels of the aortic arch appear patent as visualized. Evaluation of the pulmonary arteries is limited due to respiratory motion artifact and suboptimal opacification and timing of the contrast. No definite large or central pulmonary artery embolus identified. Mediastinum/Nodes: Mild enlargement of bilateral hilar lymph nodes measuring up to 13 mm in short axis on the right. Subcarinal lymph node measures approximately 11 mm. The esophagus is grossly unremarkable. No mediastinal fluid collection. Lungs/Pleura: Minimal bibasilar dependent atelectasis. There is a 6 mm nodule in the superior segment of the right lower lobe (56/6). No focal consolidation, pleural effusion or pneumothorax. The central airways are patent. Upper Abdomen: Cirrhosis. A 3.4 x 2.7 cm ill-defined low attenuating area in the anterior spleen is not characterized but may represent a mass or a focal area of infarct. Musculoskeletal: Degenerative changes of the spine. No acute osseous pathology. Review of the MIP images confirms the above findings. IMPRESSION: 1. No acute intrathoracic pathology. No CT evidence of central pulmonary artery embolus. 2. Cirrhosis. 3. A 3.4 x 2.7 cm ill-defined low attenuating area in the anterior spleen may represent a mass or a focal area of infarct. 4. There is a 6 mm nodule in the superior segment of the right lower lobe. Non-contrast chest CT at 6-12 months is recommended. If the nodule is stable at  time of repeat CT, then future CT at 18-24 months (from today's scan) is considered optional for low-risk patients, but is recommended for high-risk patients. This recommendation follows the consensus statement: Guidelines for Management of Incidental Pulmonary Nodules Detected on CT Images: From the Fleischner Society 2017; Radiology 2017; 284:228-243. 5. Aortic Atherosclerosis (ICD10-I70.0). Electronically Signed   By: Anner Crete M.D.   On: 08/28/2020 21:59   CT Abdomen Pelvis W Contrast  Result Date: 08/28/2020 CLINICAL DATA:  Left-sided abdominal pain over the last 3-4 days. Shortness of breath. EXAM: CT ABDOMEN AND PELVIS WITH CONTRAST TECHNIQUE: Multidetector CT imaging of the abdomen and pelvis was performed using the standard protocol following bolus administration of intravenous contrast. CONTRAST:  158mL OMNIPAQUE IOHEXOL 300 MG/ML  SOLN COMPARISON:  Ultrasound 03/20/2013 FINDINGS: Lower chest: Coronary artery calcification is noted. Globular calcification in the region of the mitral valve annulus. Heart size is normal. No pericardial effusion. There is a cluster of prominent  lymph nodes the right of the heart in the pericardial fat, the largest node measuring 14 x 17 mm. There is mild atelectasis and or scarring at the left lung base and lingula. Hepatobiliary: Pronounced cirrhosis of the liver. No evidence of a mass lesion. No calcified gallstones. Pancreas: Normal Spleen: Splenomegaly. 4-5 cm hypoperfused area in the anterior superior corner of the spleen presumably relating to a splenic infarction. This could explain the clinical presentation. Adrenals/Urinary Tract: Adrenal glands are normal. Kidneys are normal. Bladder is normal. Stomach/Bowel: The stomach and small intestine are normal. The appendix is normal. Diverticulosis of the left colon without evidence of diverticulitis. Vascular/Lymphatic: Aortic atherosclerosis. Maximal diameter of the infrarenal abdominal aorta measures 3.2 cm.  The IVC is normal. Mild nodal prominence throughout the retroperitoneum and in the porta hepatis. Reproductive: No pelvic mass. Other: No free fluid or air. Musculoskeletal: Chronic degenerative changes at the L5-S1 disc space. IMPRESSION: 1. Advanced cirrhosis of the liver. Splenomegaly. 4-5 cm hypoperfused area in the anterior superior corner of the spleen presumably relating to a splenic infarction. This could explain the clinical presentation. 2. Aortic atherosclerosis. Maximal diameter of the infrarenal abdominal aorta measures 3.2 cm. Recommend follow-up ultrasound every 3 years. This recommendation follows ACR consensus guidelines: White Paper of the ACR Incidental Findings Committee II on Vascular Findings. J Am Coll Radiol 2013; 10:789-794. 3. Nodal prominence in the pericardial station, the upper abdomen and the retroperitoneum, likely secondary to lymphatic congestion associated with cirrhosis. Aortic Atherosclerosis (ICD10-I70.0). Electronically Signed   By: Nelson Chimes M.D.   On: 08/28/2020 20:11    EKG: Independently reviewed.  EKG shows normal sinus rhythm with no acute ST elevation or depression.  QTc 472  Assessment/Plan Principal Problem:   Splenic infarction Sierra Logan is admitted to Med/Surg floor. Presented with 4 day history of abdominal pain and found to have splenic infarct vs mass on CT scan. CTA abdomen/pelvis will be obtained tomorrow to rule out thrombosis or obstruction in mesenteric vessels as etiology. Pt did have J&J Covid vaccine 6 months ago she states, this vaccine has been associated with thrombosis disorders but thrombosis usually occurs in first few weeks after the vaccine.  Check PT, aPTT Hematology consulted for further workup.   Active Problems:   Iron deficiency anemia Started on Iron supplementation.     Abdominal pain Secondary to splenic infarct. Pain medication ordered overnight.     Cirrhosis Chronic.  Patient reports she had viral hepatitis panel  that was negative when she was diagnosed with cirrhosis last year    Lung nodule 6 mm lung nodule in the right superior lower lobe.  Patient will need repeat CT in 6 to 12 months for further evaluation unless oncology has different recommendation    Peripheral neuropathy Chronic.  Continue home dose of gabapentin.    DVT prophylaxis: Lovenox for DVT prophylaxis Code Status:   Full code Family Communication:  Diagnosis and plan discussed with patient.  Patient verbalized understanding agrees with plan.  Further recommendations to follow as clinically indicated Disposition Plan:   Patient is from:  Home  Anticipated DC to:  Home  Anticipated DC date:  Anticipate 2 midnight stay in the hospital  Anticipated DC barriers: No barriers to discharge identified this time  Consults called:  Hematology/Oncology; Dr. Synetta Fail by ER provider and will see pt in am.  Admission status:  Inpatient  Yevonne Aline Edilia Ghuman MD [facility]  How to contact the [facility] Attending or Consulting provider [place] or  covering provider during after hours [place], for this patient?   1. Check the care team in [facility] and look for a) attending/consulting [facility] provider listed and b) the [facility] team listed 2. Log into [email or web address] and use [place]'s universal password to access. If you do not have the password, please contact the hospital operator. 3. Locate the [facility] provider you are looking for under [facility] and page to a number that you can be directly reached. 4. If you still have difficulty reaching the provider, please page the [facility] (Director on Call) for the Hospitalists listed on amion for assistance.  08/28/2020, 11:10 PM

## 2020-08-28 NOTE — ED Notes (Signed)
Pt transported to CT ?

## 2020-08-28 NOTE — ED Provider Notes (Signed)
[place]   MRN: [id number] DOB: Dec 19, 1955  Subjective:   Sierra Logan is a 65 y.o. female presenting for 4-day history of acute onset left-sided abdominal pain worsening progressing into the lower abdomen and right flank side.  Patient has also had progressively worsening shortness of breath.  She has had some diarrhea but no bloody stools.  Denies fever, nausea, vomiting, constipation, dysuria, hematuria, urinary frequency, falls, trauma.  Patient actually went to the emergency room yesterday and had labs and imaging done but left without being seen.  She is a high risk and has regular colonoscopies.  Reports the last one was done a year ago and is due for follow-up again in 3 years.  States that she is only been told that she has polyps but not diverticulosis.  Denies history of diverticulitis.  No current facility-administered medications for this encounter.  Current Outpatient Medications:  .  atorvastatin (LIPITOR) 10 MG tablet, TAKE 1 TABLET(10 MG) BY MOUTH DAILY, Disp: , Rfl:  .  furosemide (LASIX) 20 MG tablet, TAKE 1 TABLET BY MOUTH EVERY DAY WITH SWELLING PRESENT, Disp: , Rfl:  .  gabapentin (NEURONTIN) 400 MG capsule, TAKE 1 CAPSULE IN THE MORNING AND 2 CAPSULES IN THE EVENING, Disp: , Rfl:    Not on File  Past Medical History:  Diagnosis Date  . Hyperlipemia   . Neuropathy      History reviewed. No pertinent surgical history.  History reviewed. No pertinent family history.  Social History   Tobacco Use  . Smoking status: Current Every Day Smoker  . Smokeless tobacco: Never Used  Substance Use Topics  . Alcohol use: Not Currently    ROS   Objective:   Vitals: BP 124/66 (BP Location: Right Arm)   Pulse 94   Temp 98.1 F (36.7 C) (Oral)   Resp 20   SpO2 96%   Physical Exam Constitutional:      General: She is not in acute distress.    Appearance: Normal appearance. She is well-developed and normal weight. She is not  ill-appearing, toxic-appearing or diaphoretic.  HENT:     Head: Normocephalic and atraumatic.     Right Ear: External ear normal.     Left Ear: External ear normal.     Nose: Nose normal.     Mouth/Throat:     Mouth: Mucous membranes are moist.     Pharynx: Oropharynx is clear.  Eyes:     General: No scleral icterus.    Extraocular Movements: Extraocular movements intact.     Pupils: Pupils are equal, round, and reactive to light.  Cardiovascular:     Rate and Rhythm: Normal rate and regular rhythm.     Heart sounds: Normal heart sounds. No murmur heard. No friction rub. No gallop.   Pulmonary:     Effort: Pulmonary effort is normal. No respiratory distress.     Breath sounds: Normal breath sounds. No stridor. No wheezing, rhonchi or rales.  Abdominal:     General: Bowel sounds are normal. There is distension.     Palpations: Abdomen is soft. There is no mass.     Tenderness: There is generalized abdominal tenderness (Throughout). There is guarding. There is no right CVA tenderness, left CVA tenderness or rebound.  Skin:    General: Skin is warm and dry.     Coloration: Skin is not pale.     Findings: No rash.  Neurological:     General: No focal deficit present.  Mental Status: She is alert and oriented to person, place, and time.  Psychiatric:        Mood and Affect: Mood normal.        Behavior: Behavior normal.        Thought Content: Thought content normal.        Judgment: Judgment normal.     Results for orders placed or performed during the hospital encounter of 08/27/20 (from the past 24 hour(s))  Basic metabolic panel     Status: Abnormal   Collection Time: 08/27/20  1:44 PM  Result Value Ref Range   Sodium 133 (L) 135 - 145 mmol/L   Potassium 4.3 3.5 - 5.1 mmol/L   Chloride 95 (L) 98 - 111 mmol/L   CO2 24 22 - 32 mmol/L   Glucose, Bld 123 (H) 70 - 99 mg/dL   BUN 7 (L) 8 - 23 mg/dL   Creatinine, Ser 0.63 0.44 - 1.00 mg/dL   Calcium 8.9 8.9 - 10.3 mg/dL    GFR, Estimated >60 >60 mL/min   Anion gap 14 5 - 15  CBC     Status: Abnormal   Collection Time: 08/27/20  1:44 PM  Result Value Ref Range   WBC 11.4 (H) 4.0 - 10.5 K/uL   RBC 3.86 (L) 3.87 - 5.11 MIL/uL   Hemoglobin 8.1 (L) 12.0 - 15.0 g/dL   HCT 29.2 (L) 36.0 - 46.0 %   MCV 75.6 (L) 80.0 - 100.0 fL   MCH 21.0 (L) 26.0 - 34.0 pg   MCHC 27.7 (L) 30.0 - 36.0 g/dL   RDW 18.7 (H) 11.5 - 15.5 %   Platelets 196 150 - 400 K/uL   nRBC 0.0 0.0 - 0.2 %  Troponin I (High Sensitivity)     Status: None   Collection Time: 08/27/20  1:44 PM  Result Value Ref Range   Troponin I (High Sensitivity) 9 <18 ng/L   DG Chest 2 View  Result Date: 08/27/2020 CLINICAL DATA:  Chest pain EXAM: CHEST - 2 VIEW COMPARISON:  05/17/2009 FINDINGS: There is mild bibasilar atelectasis. No superimposed focal pulmonary infiltrate. No pneumothorax or pleural effusion. Cardiac size within normal limits. Pulmonary vascularity is normal. No acute bone abnormality. Subacute to remote left ninth rib fracture is identified laterally. IMPRESSION: No active cardiopulmonary disease. Electronically Signed   By: Fidela Salisbury MD   On: 08/27/2020 13:19    Assessment and Plan :   PDMP not reviewed this encounter.  1. Generalized abdominal pain   2. Low hemoglobin and low hematocrit   3. Shortness of breath     Patient has a substantial drop in her hemoglobin and hematocrit compared to previous labs.  Given her abdominal pain I am concerned that she is having a gastrointestinal bleed.  Emphasized need for evaluation in the emergency room.  Low suspicion for cardiac source of her symptoms as she does not have chest pain and her EKG and troponin from yesterday were reassuring.  Patient contracts for safety, will have her husband drive her to the ER now.   Jaynee Eagles, PA-C 08/28/20 1010

## 2020-08-29 ENCOUNTER — Inpatient Hospital Stay (HOSPITAL_COMMUNITY): Payer: 59

## 2020-08-29 DIAGNOSIS — D735 Infarction of spleen: Principal | ICD-10-CM

## 2020-08-29 DIAGNOSIS — D500 Iron deficiency anemia secondary to blood loss (chronic): Secondary | ICD-10-CM

## 2020-08-29 DIAGNOSIS — R1012 Left upper quadrant pain: Secondary | ICD-10-CM

## 2020-08-29 DIAGNOSIS — D509 Iron deficiency anemia, unspecified: Secondary | ICD-10-CM | POA: Diagnosis not present

## 2020-08-29 DIAGNOSIS — R1010 Upper abdominal pain, unspecified: Secondary | ICD-10-CM

## 2020-08-29 LAB — URINALYSIS, ROUTINE W REFLEX MICROSCOPIC
Bilirubin Urine: NEGATIVE
Glucose, UA: NEGATIVE mg/dL
Hgb urine dipstick: NEGATIVE
Ketones, ur: NEGATIVE mg/dL
Leukocytes,Ua: NEGATIVE
Nitrite: NEGATIVE
Protein, ur: NEGATIVE mg/dL
Specific Gravity, Urine: >1.046 — ABNORMAL HIGH (ref 1.005–1.030)
pH: 6 (ref 5.0–8.0)

## 2020-08-29 LAB — CBC
HCT: 26.9 % — ABNORMAL LOW (ref 36.0–46.0)
HCT: 26.7 % — ABNORMAL LOW (ref 36.0–46.0)
Hemoglobin: 7.3 g/dL — ABNORMAL LOW (ref 12.0–15.0)
Hemoglobin: 7.3 g/dL — ABNORMAL LOW (ref 12.0–15.0)
MCH: 20.6 pg — ABNORMAL LOW (ref 26.0–34.0)
MCH: 20.7 pg — ABNORMAL LOW (ref 26.0–34.0)
MCHC: 27.1 g/dL — ABNORMAL LOW (ref 30.0–36.0)
MCHC: 27.3 g/dL — ABNORMAL LOW (ref 30.0–36.0)
MCV: 76 fL — ABNORMAL LOW (ref 80.0–100.0)
MCV: 75.9 fL — ABNORMAL LOW (ref 80.0–100.0)
Platelets: 184 10*3/uL (ref 150–400)
Platelets: 192 10*3/uL (ref 150–400)
RBC: 3.54 MIL/uL — ABNORMAL LOW (ref 3.87–5.11)
RBC: 3.52 MIL/uL — ABNORMAL LOW (ref 3.87–5.11)
RDW: 18.9 % — ABNORMAL HIGH (ref 11.5–15.5)
RDW: 18.8 % — ABNORMAL HIGH (ref 11.5–15.5)
WBC: 9.9 10*3/uL (ref 4.0–10.5)
WBC: 11.8 10*3/uL — ABNORMAL HIGH (ref 4.0–10.5)
nRBC: 0 % (ref 0.0–0.2)
nRBC: 0 % (ref 0.0–0.2)

## 2020-08-29 LAB — PREPARE RBC (CROSSMATCH)

## 2020-08-29 LAB — BASIC METABOLIC PANEL
Anion gap: 8 (ref 5–15)
BUN: 9 mg/dL (ref 8–23)
CO2: 28 mmol/L (ref 22–32)
Calcium: 8.6 mg/dL — ABNORMAL LOW (ref 8.9–10.3)
Chloride: 97 mmol/L — ABNORMAL LOW (ref 98–111)
Creatinine, Ser: 0.69 mg/dL (ref 0.44–1.00)
GFR, Estimated: >60 mL/min (ref >60)
Glucose, Bld: 99 mg/dL (ref 70–99)
Potassium: 4 mmol/L (ref 3.5–5.1)
Sodium: 133 mmol/L — ABNORMAL LOW (ref 135–145)

## 2020-08-29 LAB — CREATININE, SERUM
Creatinine, Ser: 0.75 mg/dL (ref 0.44–1.00)
GFR, Estimated: >60 mL/min (ref >60)

## 2020-08-29 LAB — HEPATITIS PANEL, ACUTE
HCV Ab: NONREACTIVE
Hep A IgM: NONREACTIVE
Hep B C IgM: NONREACTIVE
Hepatitis B Surface Ag: NONREACTIVE

## 2020-08-29 LAB — HIV ANTIBODY (ROUTINE TESTING W REFLEX): HIV Screen 4th Generation wRfx: NONREACTIVE

## 2020-08-29 LAB — LIPASE, BLOOD: Lipase: 41 U/L (ref 11–51)

## 2020-08-29 LAB — PROTIME-INR
INR: 1.1 (ref 0.8–1.2)
Prothrombin Time: 13.6 seconds (ref 11.4–15.2)

## 2020-08-29 LAB — APTT: aPTT: 29 seconds (ref 24–36)

## 2020-08-29 MED ORDER — ATORVASTATIN CALCIUM 10 MG PO TABS
10.0000 mg | ORAL_TABLET | Freq: Every day | ORAL | Status: DC
  Administered 2020-08-29 – 2020-08-30 (×3): 10 mg via ORAL
  Filled 2020-08-29 (×3): qty 1

## 2020-08-29 MED ORDER — GABAPENTIN 400 MG PO CAPS
400.0000 mg | ORAL_CAPSULE | Freq: Every day | ORAL | Status: DC
  Administered 2020-08-29 – 2020-08-31 (×3): 400 mg via ORAL
  Filled 2020-08-29 (×3): qty 1

## 2020-08-29 MED ORDER — ALBUTEROL SULFATE HFA 108 (90 BASE) MCG/ACT IN AERS: 2.0000 | INHALATION_SPRAY | RESPIRATORY_TRACT | Status: DC | PRN

## 2020-08-29 MED ORDER — ADULT MULTIVITAMIN W/MINERALS CH
1.0000 | ORAL_TABLET | Freq: Every day | ORAL | Status: DC
  Administered 2020-08-29 – 2020-08-31 (×3): 1 via ORAL
  Filled 2020-08-29 (×3): qty 1

## 2020-08-29 MED ORDER — ACETAMINOPHEN 325 MG PO TABS: 650.0000 mg | ORAL_TABLET | Freq: Four times a day (QID) | ORAL | Status: DC | PRN

## 2020-08-29 MED ORDER — ONDANSETRON HCL 4 MG PO TABS: 4.0000 mg | ORAL_TABLET | Freq: Four times a day (QID) | ORAL | Status: DC | PRN

## 2020-08-29 MED ORDER — NICOTINE 14 MG/24HR TD PT24
14.0000 mg | MEDICATED_PATCH | Freq: Every day | TRANSDERMAL | Status: DC
  Administered 2020-08-29 – 2020-08-31 (×4): 14 mg via TRANSDERMAL
  Filled 2020-08-29 (×4): qty 1

## 2020-08-29 MED ORDER — GABAPENTIN 400 MG PO CAPS: 400.0000 mg | ORAL_CAPSULE | ORAL | Status: DC

## 2020-08-29 MED ORDER — SODIUM CHLORIDE 0.9% IV SOLUTION: Freq: Once | INTRAVENOUS | Status: DC

## 2020-08-29 MED ORDER — FERROUS SULFATE 325 (65 FE) MG PO TABS
325.0000 mg | ORAL_TABLET | Freq: Two times a day (BID) | ORAL | Status: DC
  Administered 2020-08-29 – 2020-08-31 (×5): 325 mg via ORAL
  Filled 2020-08-29 (×5): qty 1

## 2020-08-29 MED ORDER — ACETAMINOPHEN 650 MG RE SUPP: 650.0000 mg | Freq: Four times a day (QID) | RECTAL | Status: DC | PRN

## 2020-08-29 MED ORDER — GABAPENTIN 400 MG PO CAPS
1200.0000 mg | ORAL_CAPSULE | Freq: Every day | ORAL | Status: DC
  Administered 2020-08-29 – 2020-08-30 (×3): 1200 mg via ORAL
  Filled 2020-08-29 (×3): qty 3

## 2020-08-29 MED ORDER — ENOXAPARIN SODIUM 40 MG/0.4ML ~~LOC~~ SOLN
40.0000 mg | Freq: Every day | SUBCUTANEOUS | Status: DC
  Administered 2020-08-29 – 2020-08-31 (×3): 40 mg via SUBCUTANEOUS
  Filled 2020-08-29 (×3): qty 0.4

## 2020-08-29 MED ORDER — HYDROMORPHONE HCL 1 MG/ML IJ SOLN
1.0000 mg | INTRAMUSCULAR | Status: AC | PRN
  Administered 2020-08-29 (×3): 1 mg via INTRAVENOUS
  Filled 2020-08-29 (×3): qty 1

## 2020-08-29 MED ORDER — LACTATED RINGERS IV SOLN: INTRAVENOUS | Status: DC

## 2020-08-29 MED ORDER — ONDANSETRON HCL 4 MG/2ML IJ SOLN: 4.0000 mg | Freq: Four times a day (QID) | INTRAMUSCULAR | Status: DC | PRN

## 2020-08-29 MED ORDER — FUROSEMIDE 20 MG PO TABS
20.0000 mg | ORAL_TABLET | Freq: Two times a day (BID) | ORAL | Status: DC
  Administered 2020-08-29 (×2): 20 mg via ORAL
  Filled 2020-08-29 (×2): qty 1

## 2020-08-29 NOTE — ED Notes (Signed)
Pt placed in transportation list

## 2020-08-29 NOTE — Progress Notes (Signed)
[facility]                                                                              Patient Demographics  Sierra Logan, is a 65 y.o. female, DOB - July 01, 1956, YDX:[id number]  Admit date - 08/28/2020   Admitting Physician Eben Burow, MD  Outpatient Primary MD for the patient is Patient, No Pcp Per  Outpatient specialists:   LOS - 1  days   Medical records reviewed and are as summarized below:    Chief Complaint  Patient presents with  . Abdominal Pain       Brief summary   Patient is a 65 year old female with history of cirrhosis, esophageal varices, peptic duodenitis, lower extremity edema presented to ED with LUQ pain and left-sided chest pain for 4 days.  Patient reported as intermittent pain, originally started in left upper quadrant, then radiated up to left shoulder.  No nausea vomiting, dizziness.  No history of CAD DVTs or PEs.  Patient takes Lasix for chronic pedal edema, no recent change in meds. Reported that she took the [employer] vaccine 6 months ago.  Has dyspnea on exertion for the past 1 year which has slowly gotten more pronounced. Chest x-ray unremarkable CT angio chest, showed no PE, cirrhosis.  3.4x 2.7 cm ill-defined area in the anterior spleen may represent mass or focal area of infarct.  6 mm nodule in the superior segment of the right lower lobe, noncontrast CT chest recommended 6 to 12 months. CT abdomen pelvis showed splenomegaly with 4 to 5 cm hypoperfused area presumably splenic infarction.  Assessment & Plan    Principal Problem: Acute abdominal pain likely secondary to splenic infarction -Presenting with acute left upper quadrant abdominal pain, left-sided chest pain -CT angio chest, CT abdomen pelvis with possibility of splenic infarct versus mass -Continue pain control, antiemetics, follow hypercoagulable panel, lipase -Hematology oncology consulted, recommended CT angio abdomen pelvis - will follow  recommendations  Active Problems:   Iron deficiency anemia, microcytic anemia -Continue iron supplementation.  Iron profile showed iron 20, ferritin 8, saturation ratio 4 - Hemoglobin 7.3.  Baseline hemoglobin 13-14 - will transfuse 1 unit packed RBCs, will benefit from IV iron, will await hematology recommendations -No reports of GI bleed, hematemesis, hematochezia or melena. last colonoscopy was 2 years ago and was told that next colonoscopy after 3 more years  History of liver cirrhosis -No history of liver cirrhosis with esophageal varices.  Continue to follow outpatient PCP, no decompensated liver cirrhosis currently  Lung nodule -CT angio chest showed 6 mm lung nodule in right lower lobe, patient will need repeat CT in 6 to 12 months for further evaluation  Peripheral neuropathy -Continue gabapentin  Peripheral edema, hypertension -BP currently soft, continue IV fluid hydration, hold Lasix for today  Hyperlipidemia Continue statin  Obesity Estimated body mass index is 32.92 kg/m as calculated from the following:   Height as of this encounter: 5\' 2"  (1.575 m).   Weight as of this encounter: 81.6 kg.  Code Status: Full CODE STATUS DVT Prophylaxis:  enoxaparin (LOVENOX) injection 40 mg Start: 08/29/20 1000   Level of Care:  Level of care: Med-Surg Family Communication: Discussed all imaging results, lab results, explained to the patient    Disposition Plan:     Status is: Inpatient  Remains inpatient appropriate because:Inpatient level of care appropriate due to severity of illness   Dispo: The patient is from: Home              Anticipated d/c is to: Home              Anticipated d/c date is: 2 days              Patient currently is not medically stable to d/c.   Difficult to place patient No      Time Spent in minutes   35 minutes  Procedures:  None  Consultants:   Hematology oncology  Antimicrobials:   Anti-infectives (From admission, onward)    None          Medications  Scheduled Meds: . atorvastatin  10 mg Oral QHS  . enoxaparin (LOVENOX) injection  40 mg Subcutaneous Daily  . ferrous sulfate  325 mg Oral BID WC  . furosemide  20 mg Oral BID  . gabapentin  400 mg Oral Daily   And  . gabapentin  1,200 mg Oral QHS  . multivitamin with minerals  1 tablet Oral Q breakfast  . nicotine  14 mg Transdermal Daily   Continuous Infusions: . lactated ringers 125 mL/hr at 08/29/20 0935   PRN Meds:.acetaminophen **OR** acetaminophen, albuterol, HYDROmorphone (DILAUDID) injection, ondansetron **OR** ondansetron (ZOFRAN) IV      Subjective:   Sierra Logan was seen and examined today.  Currently abdominal pain in the left upper quadrant 3/10, states controlled.  No chest pain.  No nausea vomiting or diarrhea.  Patient denies dizziness, new weakness, numbess, tingling. No acute events overnight.    Objective:   Vitals:   08/29/20 0300 08/29/20 0500 08/29/20 0600 08/29/20 0900  BP: 133/72 106/71 115/80 129/64  Pulse: 94 91 86 96  Resp: 15 (!) 21 19 (!) 24  Temp:      TempSrc:      SpO2: 95% 97% 96% 99%  Weight:      Height:       No intake or output data in the 24 hours ending 08/29/20 1107   Wt Readings from Last 3 Encounters:  08/28/20 81.6 kg     Exam  General: Alert and oriented x 3, NAD  Cardiovascular: S1 S2 auscultated, no murmurs, RRR  Respiratory: Clear to auscultation bilaterally, no wheezing, rales or rhonchi  Gastrointestinal: Soft, mild LUQ and flank TTP, nondistended, + bowel sounds  Ext: no pedal edema bilaterally  Neuro: no new deficits  Musculoskeletal: No digital cyanosis, clubbing  Skin: No rashes  Psych: Normal affect and demeanor, alert and oriented x3    Data Reviewed:  I have personally reviewed following labs and imaging studies  Micro Results Recent Results (from the past 240 hour(s))  Resp Panel by RT-PCR (Flu A&B, Covid) Nasopharyngeal Swab     Status: None    Collection Time: 08/28/20  9:05 PM   Specimen: Nasopharyngeal Swab; Nasopharyngeal(NP) swabs in vial transport medium  Result Value Ref Range Status   SARS Coronavirus 2 by RT PCR NEGATIVE NEGATIVE Final    Comment: (NOTE) SARS-CoV-2 target nucleic acids are NOT DETECTED.  The SARS-CoV-2 RNA is generally detectable in upper respiratory specimens during the acute phase of infection. The lowest concentration of SARS-CoV-2 viral copies this assay can detect is 138  copies/mL. A negative result does not preclude SARS-Cov-2 infection and should not be used as the sole basis for treatment or other patient management decisions. A negative result may occur with  improper specimen collection/handling, submission of specimen other than nasopharyngeal swab, presence of viral mutation(s) within the areas targeted by this assay, and inadequate number of viral copies(<138 copies/mL). A negative result must be combined with clinical observations, patient history, and epidemiological information. The expected result is Negative.  Fact Sheet for Patients:  EntrepreneurPulse.com.au  Fact Sheet for Healthcare Providers:  IncredibleEmployment.be  This test is no t yet approved or cleared by the [place] FDA and  has been authorized for detection and/or diagnosis of SARS-CoV-2 by FDA under an Emergency Use Authorization (EUA). This EUA will remain  in effect (meaning this test can be used) for the duration of the COVID-19 declaration under Section 564(b)(1) of the Act, 21 U.S.C.section 360bbb-3(b)(1), unless the authorization is terminated  or revoked sooner.       Influenza A by PCR NEGATIVE NEGATIVE Final   Influenza B by PCR NEGATIVE NEGATIVE Final    Comment: (NOTE) The Xpert Xpress SARS-CoV-2/FLU/RSV plus assay is intended as an aid in the diagnosis of influenza from Nasopharyngeal swab specimens and should not be used as a sole basis for treatment.  Nasal washings and aspirates are unacceptable for Xpert Xpress SARS-CoV-2/FLU/RSV testing.  Fact Sheet for Patients: EntrepreneurPulse.com.au  Fact Sheet for Healthcare Providers: IncredibleEmployment.be  This test is not yet approved or cleared by the [place] FDA and has been authorized for detection and/or diagnosis of SARS-CoV-2 by FDA under an Emergency Use Authorization (EUA). This EUA will remain in effect (meaning this test can be used) for the duration of the COVID-19 declaration under Section 564(b)(1) of the Act, 21 U.S.C. section 360bbb-3(b)(1), unless the authorization is terminated or revoked.  Performed at [facility], [place] [address], [place], [place] [address]     Radiology Reports DG Chest 2 View  Result Date: 08/27/2020 CLINICAL DATA:  Chest pain EXAM: CHEST - 2 VIEW COMPARISON:  05/17/2009 FINDINGS: There is mild bibasilar atelectasis. No superimposed focal pulmonary infiltrate. No pneumothorax or pleural effusion. Cardiac size within normal limits. Pulmonary vascularity is normal. No acute bone abnormality. Subacute to remote left ninth rib fracture is identified laterally. IMPRESSION: No active cardiopulmonary disease. Electronically Signed   By: Fidela Salisbury MD   On: 08/27/2020 13:19   CT Angio Chest PE W and/or Wo Contrast  Result Date: 08/28/2020 CLINICAL DATA:  65 year old female with elevated D-dimer. Concern for pulmonary embolism. EXAM: CT ANGIOGRAPHY CHEST WITH CONTRAST TECHNIQUE: Multidetector CT imaging of the chest was performed using the standard protocol during bolus administration of intravenous contrast. Multiplanar CT image reconstructions and MIPs were obtained to evaluate the vascular anatomy. CONTRAST:  17mL OMNIPAQUE IOHEXOL 350 MG/ML SOLN COMPARISON:  Chest radiograph dated 08/27/2020. FINDINGS: Cardiovascular: There is no cardiomegaly or pericardial effusion. Three-vessel coronary vascular  calcification. There is calcification of the mitral annulus. Mild atherosclerotic calcification of the thoracic aorta. No aneurysmal dilatation or dissection. The origins of the great vessels of the aortic arch appear patent as visualized. Evaluation of the pulmonary arteries is limited due to respiratory motion artifact and suboptimal opacification and timing of the contrast. No definite large or central pulmonary artery embolus identified. Mediastinum/Nodes: Mild enlargement of bilateral hilar lymph nodes measuring up to 13 mm in short axis on the right. Subcarinal lymph node measures approximately 11 mm. The esophagus is grossly  unremarkable. No mediastinal fluid collection. Lungs/Pleura: Minimal bibasilar dependent atelectasis. There is a 6 mm nodule in the superior segment of the right lower lobe (56/6). No focal consolidation, pleural effusion or pneumothorax. The central airways are patent. Upper Abdomen: Cirrhosis. A 3.4 x 2.7 cm ill-defined low attenuating area in the anterior spleen is not characterized but may represent a mass or a focal area of infarct. Musculoskeletal: Degenerative changes of the spine. No acute osseous pathology. Review of the MIP images confirms the above findings. IMPRESSION: 1. No acute intrathoracic pathology. No CT evidence of central pulmonary artery embolus. 2. Cirrhosis. 3. A 3.4 x 2.7 cm ill-defined low attenuating area in the anterior spleen may represent a mass or a focal area of infarct. 4. There is a 6 mm nodule in the superior segment of the right lower lobe. Non-contrast chest CT at 6-12 months is recommended. If the nodule is stable at time of repeat CT, then future CT at 18-24 months (from today's scan) is considered optional for low-risk patients, but is recommended for high-risk patients. This recommendation follows the consensus statement: Guidelines for Management of Incidental Pulmonary Nodules Detected on CT Images: From the Fleischner Society 2017; Radiology  2017; 284:228-243. 5. Aortic Atherosclerosis (ICD10-I70.0). Electronically Signed   By: Anner Crete M.D.   On: 08/28/2020 21:59   CT Abdomen Pelvis W Contrast  Result Date: 08/28/2020 CLINICAL DATA:  Left-sided abdominal pain over the last 3-4 days. Shortness of breath. EXAM: CT ABDOMEN AND PELVIS WITH CONTRAST TECHNIQUE: Multidetector CT imaging of the abdomen and pelvis was performed using the standard protocol following bolus administration of intravenous contrast. CONTRAST:  160mL OMNIPAQUE IOHEXOL 300 MG/ML  SOLN COMPARISON:  Ultrasound 03/20/2013 FINDINGS: Lower chest: Coronary artery calcification is noted. Globular calcification in the region of the mitral valve annulus. Heart size is normal. No pericardial effusion. There is a cluster of prominent lymph nodes the right of the heart in the pericardial fat, the largest node measuring 14 x 17 mm. There is mild atelectasis and or scarring at the left lung base and lingula. Hepatobiliary: Pronounced cirrhosis of the liver. No evidence of a mass lesion. No calcified gallstones. Pancreas: Normal Spleen: Splenomegaly. 4-5 cm hypoperfused area in the anterior superior corner of the spleen presumably relating to a splenic infarction. This could explain the clinical presentation. Adrenals/Urinary Tract: Adrenal glands are normal. Kidneys are normal. Bladder is normal. Stomach/Bowel: The stomach and small intestine are normal. The appendix is normal. Diverticulosis of the left colon without evidence of diverticulitis. Vascular/Lymphatic: Aortic atherosclerosis. Maximal diameter of the infrarenal abdominal aorta measures 3.2 cm. The IVC is normal. Mild nodal prominence throughout the retroperitoneum and in the porta hepatis. Reproductive: No pelvic mass. Other: No free fluid or air. Musculoskeletal: Chronic degenerative changes at the L5-S1 disc space. IMPRESSION: 1. Advanced cirrhosis of the liver. Splenomegaly. 4-5 cm hypoperfused area in the anterior  superior corner of the spleen presumably relating to a splenic infarction. This could explain the clinical presentation. 2. Aortic atherosclerosis. Maximal diameter of the infrarenal abdominal aorta measures 3.2 cm. Recommend follow-up ultrasound every 3 years. This recommendation follows ACR consensus guidelines: White Paper of the ACR Incidental Findings Committee II on Vascular Findings. J Am Coll Radiol 2013; 10:789-794. 3. Nodal prominence in the pericardial station, the upper abdomen and the retroperitoneum, likely secondary to lymphatic congestion associated with cirrhosis. Aortic Atherosclerosis (ICD10-I70.0). Electronically Signed   By: Nelson Chimes M.D.   On: 08/28/2020 20:11    Lab Data:  CBC:  Recent Labs  Lab 08/27/20 1344 08/28/20 1033 08/29/20 0054 08/29/20 0318  WBC 11.4* 8.6 9.9 11.8*  HGB 8.1* 8.0* 7.3* 7.3*  HCT 29.2* 29.6* 26.9* 26.7*  MCV 75.6* 76.5* 76.0* 75.9*  PLT 196 174 184 161   Basic Metabolic Panel: Recent Labs  Lab 08/27/20 1344 08/28/20 1033 08/29/20 0054 08/29/20 0318  NA 133* 136  --  133*  K 4.3 3.8  --  4.0  CL 95* 98  --  97*  CO2 24 26  --  28  GLUCOSE 123* 141*  --  99  BUN 7* 9  --  9  CREATININE 0.63 0.65 0.75 0.69  CALCIUM 8.9 9.2  --  8.6*   GFR: Estimated Creatinine Clearance: 70.3 mL/min (by C-G formula based on SCr of 0.69 mg/dL). Liver Function Tests: Recent Labs  Lab 08/28/20 1033  AST 31  ALT 26  ALKPHOS 148*  BILITOT 0.8  PROT 7.0  ALBUMIN 3.3*   No results for input(s): LIPASE, AMYLASE in the last 168 hours. No results for input(s): AMMONIA in the last 168 hours. Coagulation Profile: Recent Labs  Lab 08/28/20 2350  INR 1.1   Cardiac Enzymes: No results for input(s): CKTOTAL, CKMB, CKMBINDEX, TROPONINI in the last 168 hours. BNP (last 3 results) No results for input(s): PROBNP in the last 8760 hours. HbA1C: No results for input(s): HGBA1C in the last 72 hours. CBG: No results for input(s): GLUCAP in the last  168 hours. Lipid Profile: No results for input(s): CHOL, HDL, LDLCALC, TRIG, CHOLHDL, LDLDIRECT in the last 72 hours. Thyroid Function Tests: No results for input(s): TSH, T4TOTAL, FREET4, T3FREE, THYROIDAB in the last 72 hours. Anemia Panel: Recent Labs    08/28/20 1706  VITAMINB12 876  FOLATE 31.2  FERRITIN 8*  TIBC 466*  IRON 20*  RETICCTPCT 3.3*   Urine analysis:    Component Value Date/Time   COLORURINE YELLOW 08/29/2020 0020   APPEARANCEUR CLEAR 08/29/2020 0020   LABSPEC >1.046 (H) 08/29/2020 0020   PHURINE 6.0 08/29/2020 0020   GLUCOSEU NEGATIVE 08/29/2020 0020   HGBUR NEGATIVE 08/29/2020 0020   BILIRUBINUR NEGATIVE 08/29/2020 0020   KETONESUR NEGATIVE 08/29/2020 0020   PROTEINUR NEGATIVE 08/29/2020 0020   NITRITE NEGATIVE 08/29/2020 0020   LEUKOCYTESUR NEGATIVE 08/29/2020 0020     Cruz Bong M.D. [facility] 08/29/2020, 11:07 AM   Call night coverage person covering after 7pm

## 2020-08-29 NOTE — ED Notes (Signed)
  Patient placed on 2 L O2 via [place] for desaturations while asleep.  Patient states she has sleep apnea.

## 2020-08-29 NOTE — ED Notes (Signed)
Breakfast ordered 

## 2020-08-29 NOTE — ED Notes (Signed)
Dinner Tray Ordered @ 1710.

## 2020-08-29 NOTE — ED Notes (Signed)
Lunch Tray Ordered @ 1030. 

## 2020-08-29 NOTE — Consult Note (Signed)
[place] [phone]   [phone]  INITIAL CONSULT NOTE  Patient Care Team: Patient, No Pcp Per as PCP - General (General Practice)   CHIEF COMPLAINTS/PURPOSE OF CONSULTATION:  "Splenic Infarct "  HISTORY OF PRESENTING ILLNESS:  Sierra Logan 65 y.o. 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   On review of the previous records Sierra Logan presented to the [facility] Emergency Department on 08/28/2020 with 4 days of acute onset left-sided abdominal pain progressively worsening.  She notes that she had had some diarrhea but no bloody stools.  In emergency department she underwent a CT scan of the abdomen which showed advanced porosis of the liver with splenomegaly and a 4 to 5 cm hyperperfused area in the anterior superior corner of the spleen presumably caused by a splenic infarction.  She was admitted to the general medicine service and due to concern for this splenic infarction hematology was consulted for further evaluation.  On exam today Sierra Logan reports that her symptoms initially began on Monday of this week.  She reports that it is a sharp stabbing pain that is under her left ribs on the left side and shoots up to her left shoulder.  She reports that this has not been associated with any nausea, vomiting, or diarrhea.  She also has not had any issues with fevers, chills, sweats or recent weight loss.  She is never had pain like this before in the past.  She also notes that she has no personal history of VTE, PE, heart attack, or stroke.  Her family history is also not remarkable for any blood clots.  There is also no rheumatological history within the family.  She carries a diagnosis of cirrhosis and has baseline splenomegaly.  A full 10 point ROS is listed below.  MEDICAL HISTORY:  Past Medical History:  Diagnosis Date  . Hyperlipemia   .  Neuropathy     SURGICAL HISTORY: History reviewed. No pertinent surgical history.  SOCIAL HISTORY: Social History   Socioeconomic History  . Marital status: Married    Spouse name: Not on file  . Number of children: Not on file  . Years of education: Not on file  . Highest education level: Not on file  Occupational History  . Not on file  Tobacco Use  . Smoking status: Current Every Day Smoker  . Smokeless tobacco: Never Used  Substance and Sexual Activity  . Alcohol use: Not Currently  . Drug use: Not on file  . Sexual activity: Not on file  Other Topics Concern  . Not on file  Social History Narrative  . Not on file   Social Determinants of Health   Financial Resource Strain: Not on file  Food Insecurity: Not on file  Transportation Needs: Not on file  Physical Activity: Not on file  Stress: Not on file  Social Connections: Not on file  Intimate Partner Violence: Not on file    FAMILY HISTORY: History reviewed. No pertinent family history.  ALLERGIES:  has No Known Allergies.  MEDICATIONS:  Current Facility-Administered Medications  Medication Dose Route Frequency Provider Last Rate Last Admin  . acetaminophen (TYLENOL) tablet 650 mg  650 mg Oral Q6H PRN Chotiner, Yevonne Aline, MD       Or  . acetaminophen (TYLENOL) suppository 650 mg  650 mg Rectal Q6H PRN Chotiner, Yevonne Aline, MD      .  albuterol (VENTOLIN HFA) 108 (90 Base) MCG/ACT inhaler 2 puff  2 puff Inhalation Q4H PRN Chotiner, Yevonne Aline, MD      . atorvastatin (LIPITOR) tablet 10 mg  10 mg Oral QHS Chotiner, Yevonne Aline, MD   10 mg at 08/29/20 0048  . enoxaparin (LOVENOX) injection 40 mg  40 mg Subcutaneous Daily Chotiner, Yevonne Aline, MD   40 mg at 08/29/20 0937  . ferrous sulfate tablet 325 mg  325 mg Oral BID WC Chotiner, Yevonne Aline, MD   325 mg at 08/29/20 0936  . furosemide (LASIX) tablet 20 mg  20 mg Oral BID Chotiner, Yevonne Aline, MD   20 mg at 08/29/20 0937  . gabapentin (NEURONTIN) capsule 400 mg  400  mg Oral Daily Chotiner, Yevonne Aline, MD   400 mg at 08/29/20 1517   And  . gabapentin (NEURONTIN) capsule 1,200 mg  1,200 mg Oral QHS Chotiner, Yevonne Aline, MD   1,200 mg at 08/29/20 0048  . HYDROmorphone (DILAUDID) injection 1 mg  1 mg Intravenous Q4H PRN Chotiner, Yevonne Aline, MD   1 mg at 08/29/20 0109  . lactated ringers infusion   Intravenous Continuous Chotiner, Yevonne Aline, MD 125 mL/hr at 08/29/20 0935 New Bag at 08/29/20 0935  . multivitamin with minerals tablet 1 tablet  1 tablet Oral Q breakfast Chotiner, Yevonne Aline, MD   1 tablet at 08/29/20 0936  . nicotine (NICODERM CQ - dosed in mg/24 hours) patch 14 mg  14 mg Transdermal Daily Chotiner, Yevonne Aline, MD   14 mg at 08/29/20 1055  . ondansetron (ZOFRAN) tablet 4 mg  4 mg Oral Q6H PRN Chotiner, Yevonne Aline, MD       Or  . ondansetron (ZOFRAN) injection 4 mg  4 mg Intravenous Q6H PRN Chotiner, Yevonne Aline, MD       Current Outpatient Medications  Medication Sig Dispense Refill  . acetaminophen (TYLENOL) 500 MG tablet Take 500-1,000 mg by mouth every 8 (eight) hours as needed for mild pain or headache.    Marland Kitchen atorvastatin (LIPITOR) 10 MG tablet Take 10 mg by mouth at bedtime.    Marland Kitchen BIOTIN PO Take 1 tablet by mouth daily with breakfast.    . Calcium Carb-Cholecalciferol (CALCIUM+D3 PO) Take 1 tablet by mouth daily after breakfast.    . furosemide (LASIX) 20 MG tablet Take 20 mg by mouth 2 (two) times daily.    Marland Kitchen gabapentin (NEURONTIN) 400 MG capsule Take 400-1,200 mg by mouth See admin instructions. Take 400 mg by mouth in the morning and 1,200 mg at bedtime    . Multiple Vitamins-Calcium (ONE-A-DAY WOMENS FORMULA PO) Take 1 tablet by mouth daily with breakfast.    . Omega-3 Fatty Acids (FISH OIL PO) Take 1 capsule by mouth daily with breakfast.    . VITAMIN E PO Take 1 capsule by mouth daily with breakfast.      REVIEW OF SYSTEMS:   Constitutional: ( - ) fevers, ( - )  chills , ( - ) night sweats Eyes: ( - ) blurriness of vision, ( - ) double  vision, ( - ) watery eyes Ears, nose, mouth, throat, and face: ( - ) mucositis, ( - ) sore throat Respiratory: ( - ) cough, ( - ) dyspnea, ( - ) wheezes Cardiovascular: ( - ) palpitation, ( - ) chest discomfort, ( - ) lower extremity swelling Gastrointestinal:  ( - ) nausea, ( - ) heartburn, ( - ) change in bowel habits Skin: ( - )  abnormal skin rashes Lymphatics: ( - ) new lymphadenopathy, ( - ) easy bruising Neurological: ( - ) numbness, ( - ) tingling, ( - ) new weaknesses Behavioral/Psych: ( - ) mood change, ( - ) new changes  All other systems were reviewed with the patient and are negative.  PHYSICAL EXAMINATION:  Vitals:   08/29/20 0600 08/29/20 0900  BP: 115/80 129/64  Pulse: 86 96  Resp: 19 (!) 24  Temp:    SpO2: 96% 99%   Filed Weights   08/28/20 1800  Weight: 180 lb (81.6 kg)    GENERAL: well appearing middle aged Caucasian female in NAD  SKIN: skin color, texture, turgor are normal, no rashes or significant lesions EYES: conjunctiva are pink and non-injected, sclera clear LUNGS: clear to auscultation and percussion with normal breathing effort HEART: regular rate & rhythm and no murmurs and no lower extremity edema ABDOMEN: soft, non-tender, non-distended, normal bowel sounds Musculoskeletal: no cyanosis of digits and no clubbing  PSYCH: alert & oriented x 3, fluent speech NEURO: no focal motor/sensory deficits  LABORATORY DATA:  I have reviewed the data as listed CBC Latest Ref Rng & Units 08/29/2020 08/29/2020 08/28/2020  WBC 4.0 - 10.5 K/uL 11.8(H) 9.9 8.6  Hemoglobin 12.0 - 15.0 g/dL 7.3(L) 7.3(L) 8.0(L)  Hematocrit 36.0 - 46.0 % 26.7(L) 26.9(L) 29.6(L)  Platelets 150 - 400 K/uL 192 184 174    CMP Latest Ref Rng & Units 08/29/2020 08/29/2020 08/28/2020  Glucose 70 - 99 mg/dL 99 - 141(H)  BUN 8 - 23 mg/dL 9 - 9  Creatinine 0.44 - 1.00 mg/dL 0.69 0.75 0.65  Sodium 135 - 145 mmol/L 133(L) - 136  Potassium 3.5 - 5.1 mmol/L 4.0 - 3.8  Chloride 98 - 111 mmol/L  97(L) - 98  CO2 22 - 32 mmol/L 28 - 26  Calcium 8.9 - 10.3 mg/dL 8.6(L) - 9.2  Total Protein 6.5 - 8.1 g/dL - - 7.0  Total Bilirubin 0.3 - 1.2 mg/dL - - 0.8  Alkaline Phos 38 - 126 U/L - - 148(H)  AST 15 - 41 U/L - - 31  ALT 0 - 44 U/L - - 26     RADIOGRAPHIC STUDIES: DG Chest 2 View  Result Date: 08/27/2020 CLINICAL DATA:  Chest pain EXAM: CHEST - 2 VIEW COMPARISON:  05/17/2009 FINDINGS: There is mild bibasilar atelectasis. No superimposed focal pulmonary infiltrate. No pneumothorax or pleural effusion. Cardiac size within normal limits. Pulmonary vascularity is normal. No acute bone abnormality. Subacute to remote left ninth rib fracture is identified laterally. IMPRESSION: No active cardiopulmonary disease. Electronically Signed   By: Fidela Salisbury MD   On: 08/27/2020 13:19   CT Angio Chest PE W and/or Wo Contrast  Result Date: 08/28/2020 CLINICAL DATA:  65 year old female with elevated D-dimer. Concern for pulmonary embolism. EXAM: CT ANGIOGRAPHY CHEST WITH CONTRAST TECHNIQUE: Multidetector CT imaging of the chest was performed using the standard protocol during bolus administration of intravenous contrast. Multiplanar CT image reconstructions and MIPs were obtained to evaluate the vascular anatomy. CONTRAST:  54mL OMNIPAQUE IOHEXOL 350 MG/ML SOLN COMPARISON:  Chest radiograph dated 08/27/2020. FINDINGS: Cardiovascular: There is no cardiomegaly or pericardial effusion. Three-vessel coronary vascular calcification. There is calcification of the mitral annulus. Mild atherosclerotic calcification of the thoracic aorta. No aneurysmal dilatation or dissection. The origins of the great vessels of the aortic arch appear patent as visualized. Evaluation of the pulmonary arteries is limited due to respiratory motion artifact and suboptimal opacification and timing of the contrast.  No definite large or central pulmonary artery embolus identified. Mediastinum/Nodes: Mild enlargement of bilateral hilar  lymph nodes measuring up to 13 mm in short axis on the right. Subcarinal lymph node measures approximately 11 mm. The esophagus is grossly unremarkable. No mediastinal fluid collection. Lungs/Pleura: Minimal bibasilar dependent atelectasis. There is a 6 mm nodule in the superior segment of the right lower lobe (56/6). No focal consolidation, pleural effusion or pneumothorax. The central airways are patent. Upper Abdomen: Cirrhosis. A 3.4 x 2.7 cm ill-defined low attenuating area in the anterior spleen is not characterized but may represent a mass or a focal area of infarct. Musculoskeletal: Degenerative changes of the spine. No acute osseous pathology. Review of the MIP images confirms the above findings. IMPRESSION: 1. No acute intrathoracic pathology. No CT evidence of central pulmonary artery embolus. 2. Cirrhosis. 3. A 3.4 x 2.7 cm ill-defined low attenuating area in the anterior spleen may represent a mass or a focal area of infarct. 4. There is a 6 mm nodule in the superior segment of the right lower lobe. Non-contrast chest CT at 6-12 months is recommended. If the nodule is stable at time of repeat CT, then future CT at 18-24 months (from today's scan) is considered optional for low-risk patients, but is recommended for high-risk patients. This recommendation follows the consensus statement: Guidelines for Management of Incidental Pulmonary Nodules Detected on CT Images: From the Fleischner Society 2017; Radiology 2017; 284:228-243. 5. Aortic Atherosclerosis (ICD10-I70.0). Electronically Signed   By: Anner Crete M.D.   On: 08/28/2020 21:59   CT Abdomen Pelvis W Contrast  Result Date: 08/28/2020 CLINICAL DATA:  Left-sided abdominal pain over the last 3-4 days. Shortness of breath. EXAM: CT ABDOMEN AND PELVIS WITH CONTRAST TECHNIQUE: Multidetector CT imaging of the abdomen and pelvis was performed using the standard protocol following bolus administration of intravenous contrast. CONTRAST:  135mL  OMNIPAQUE IOHEXOL 300 MG/ML  SOLN COMPARISON:  Ultrasound 03/20/2013 FINDINGS: Lower chest: Coronary artery calcification is noted. Globular calcification in the region of the mitral valve annulus. Heart size is normal. No pericardial effusion. There is a cluster of prominent lymph nodes the right of the heart in the pericardial fat, the largest node measuring 14 x 17 mm. There is mild atelectasis and or scarring at the left lung base and lingula. Hepatobiliary: Pronounced cirrhosis of the liver. No evidence of a mass lesion. No calcified gallstones. Pancreas: Normal Spleen: Splenomegaly. 4-5 cm hypoperfused area in the anterior superior corner of the spleen presumably relating to a splenic infarction. This could explain the clinical presentation. Adrenals/Urinary Tract: Adrenal glands are normal. Kidneys are normal. Bladder is normal. Stomach/Bowel: The stomach and small intestine are normal. The appendix is normal. Diverticulosis of the left colon without evidence of diverticulitis. Vascular/Lymphatic: Aortic atherosclerosis. Maximal diameter of the infrarenal abdominal aorta measures 3.2 cm. The IVC is normal. Mild nodal prominence throughout the retroperitoneum and in the porta hepatis. Reproductive: No pelvic mass. Other: No free fluid or air. Musculoskeletal: Chronic degenerative changes at the L5-S1 disc space. IMPRESSION: 1. Advanced cirrhosis of the liver. Splenomegaly. 4-5 cm hypoperfused area in the anterior superior corner of the spleen presumably relating to a splenic infarction. This could explain the clinical presentation. 2. Aortic atherosclerosis. Maximal diameter of the infrarenal abdominal aorta measures 3.2 cm. Recommend follow-up ultrasound every 3 years. This recommendation follows ACR consensus guidelines: White Paper of the ACR Incidental Findings Committee II on Vascular Findings. J Am Coll Radiol 2013; 10:789-794. 3. Nodal prominence in the pericardial  station, the upper abdomen and the  retroperitoneum, likely secondary to lymphatic congestion associated with cirrhosis. Aortic Atherosclerosis (ICD10-I70.0). Electronically Signed   By: Nelson Chimes M.D.   On: 08/28/2020 20:11    ASSESSMENT & PLAN DIJON KOHLMAN 65 y.o. 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 --From hematological perspective would recommend ordering flow cytometry, protein C levels, protein levels, lupus anticoagulant panel, and lupus anticoagulant antibodies. --Often in these cases it is not possible to find the etiology causing the patient's splenic infarct. --Recommend supportive care with pain control and hydration.  No clear indication for therapeutic anticoagulation therapy at this time. --Hematology will continue to follow   All questions were answered. The patient knows to call the clinic with any problems, questions or concerns.  A total of more than 50 minutes were spent on this encounter and over half of that time was spent on counseling and coordination of care as outlined above.   Ledell Peoples, MD Department of Hematology/Oncology [place] [facility] Phone: [phone] Pager: [phone] Email: [email or web address]  08/29/2020 10:57 AM

## 2020-08-30 ENCOUNTER — Other Ambulatory Visit (HOSPITAL_COMMUNITY): Payer: 59

## 2020-08-30 DIAGNOSIS — D500 Iron deficiency anemia secondary to blood loss (chronic): Secondary | ICD-10-CM | POA: Diagnosis not present

## 2020-08-30 DIAGNOSIS — K7030 Alcoholic cirrhosis of liver without ascites: Secondary | ICD-10-CM | POA: Diagnosis not present

## 2020-08-30 DIAGNOSIS — D735 Infarction of spleen: Secondary | ICD-10-CM | POA: Diagnosis not present

## 2020-08-30 DIAGNOSIS — D509 Iron deficiency anemia, unspecified: Secondary | ICD-10-CM | POA: Diagnosis not present

## 2020-08-30 DIAGNOSIS — R911 Solitary pulmonary nodule: Secondary | ICD-10-CM

## 2020-08-30 LAB — COMPREHENSIVE METABOLIC PANEL
ALT: 22 U/L (ref 0–44)
AST: 31 U/L (ref 15–41)
Albumin: 2.7 g/dL — ABNORMAL LOW (ref 3.5–5.0)
Alkaline Phosphatase: 115 U/L (ref 38–126)
Anion gap: 9 (ref 5–15)
BUN: 12 mg/dL (ref 8–23)
CO2: 27 mmol/L (ref 22–32)
Calcium: 8.5 mg/dL — ABNORMAL LOW (ref 8.9–10.3)
Chloride: 97 mmol/L — ABNORMAL LOW (ref 98–111)
Creatinine, Ser: 0.67 mg/dL (ref 0.44–1.00)
GFR, Estimated: >60 mL/min (ref >60)
Glucose, Bld: 122 mg/dL — ABNORMAL HIGH (ref 70–99)
Potassium: 3.8 mmol/L (ref 3.5–5.1)
Sodium: 133 mmol/L — ABNORMAL LOW (ref 135–145)
Total Bilirubin: 0.9 mg/dL (ref 0.3–1.2)
Total Protein: 5.9 g/dL — ABNORMAL LOW (ref 6.5–8.1)

## 2020-08-30 LAB — CBC
HCT: 28.5 % — ABNORMAL LOW (ref 36.0–46.0)
Hemoglobin: 8 g/dL — ABNORMAL LOW (ref 12.0–15.0)
MCH: 22 pg — ABNORMAL LOW (ref 26.0–34.0)
MCHC: 28.1 g/dL — ABNORMAL LOW (ref 30.0–36.0)
MCV: 78.5 fL — ABNORMAL LOW (ref 80.0–100.0)
Platelets: 171 10*3/uL (ref 150–400)
RBC: 3.63 MIL/uL — ABNORMAL LOW (ref 3.87–5.11)
RDW: 20.8 % — ABNORMAL HIGH (ref 11.5–15.5)
WBC: 9.1 10*3/uL (ref 4.0–10.5)
nRBC: 0.2 % (ref 0.0–0.2)

## 2020-08-30 LAB — CARDIOLIPIN ANTIBODIES, IGG, IGM, IGA
Anticardiolipin IgA: <9 APL U/mL (ref 0–11)
Anticardiolipin IgG: <9 GPL U/mL (ref 0–14)
Anticardiolipin IgM: <9 MPL U/mL (ref 0–12)

## 2020-08-30 LAB — LUPUS ANTICOAGULANT PANEL
DRVVT: 32.2 s (ref 0.0–47.0)
PTT Lupus Anticoagulant: 40.7 s (ref 0.0–51.9)

## 2020-08-30 LAB — BRAIN NATRIURETIC PEPTIDE: B Natriuretic Peptide: 128.1 pg/mL — ABNORMAL HIGH (ref 0.0–100.0)

## 2020-08-30 LAB — PROTEIN S, TOTAL: Protein S Ag, Total: 73 % (ref 60–150)

## 2020-08-30 MED ORDER — OXYCODONE HCL 5 MG PO TABS
5.0000 mg | ORAL_TABLET | ORAL | Status: DC | PRN
  Administered 2020-08-30 – 2020-08-31 (×4): 5 mg via ORAL
  Filled 2020-08-30 (×4): qty 1

## 2020-08-30 MED ORDER — HYDROCODONE-ACETAMINOPHEN 5-325 MG PO TABS: 1.0000 | ORAL_TABLET | Freq: Four times a day (QID) | ORAL | Status: DC | PRN

## 2020-08-30 MED ORDER — HYDROMORPHONE HCL 1 MG/ML IJ SOLN
1.0000 mg | INTRAMUSCULAR | Status: DC | PRN
  Administered 2020-08-30: 1 mg via INTRAVENOUS
  Filled 2020-08-30: qty 1

## 2020-08-30 MED ORDER — FUROSEMIDE 40 MG PO TABS
40.0000 mg | ORAL_TABLET | Freq: Every day | ORAL | Status: DC
  Administered 2020-08-30 – 2020-08-31 (×2): 40 mg via ORAL
  Filled 2020-08-30 (×2): qty 1

## 2020-08-30 MED ORDER — POLYETHYLENE GLYCOL 3350 17 G PO PACK
17.0000 g | PACK | Freq: Once | ORAL | Status: AC
  Administered 2020-08-30: 17 g via ORAL
  Filled 2020-08-30: qty 1

## 2020-08-30 MED ORDER — HYDROMORPHONE HCL 1 MG/ML IJ SOLN: 1.0000 mg | INTRAMUSCULAR | Status: DC | PRN

## 2020-08-30 MED ORDER — DOCUSATE SODIUM 100 MG PO CAPS
100.0000 mg | ORAL_CAPSULE | Freq: Two times a day (BID) | ORAL | Status: DC
  Administered 2020-08-30 – 2020-08-31 (×3): 100 mg via ORAL
  Filled 2020-08-30 (×3): qty 1

## 2020-08-30 NOTE — Progress Notes (Signed)
Please see in addition to PT note:   SATURATION QUALIFICATIONS: (This note is used to comply with regulatory documentation for home oxygen)  Patient Saturations on Room Air at Rest = 85%  Patient Saturations on Room Air while Ambulating = DNT due to rapid desaturation at rest   Patient Saturations on 2 Liters of oxygen while Ambulating = 88%, able to recover back up to 90%   Please briefly explain why patient needs home oxygen:rapid O2 desaturation at rest and with activity on room air   [name], DPT, [place]    Pager [place] [phone]

## 2020-08-30 NOTE — Evaluation (Signed)
Physical Therapy Evaluation Patient Details Name: Sierra Logan MRN: [id number] DOB: 08/17/1955 Today's Date: 08/30/2020   History of Present Illness  65yo female c/o LLQ pain and L sided chest pain. 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 PMH neuropathy, chronic pedal edema, HLD  Clinical Impression   Patient received in recliner, very pleasant and cooperative with therapy. Quickly desats to 85% on room air at rest and was unable to recover to >90% without 2LPM O2. Able to mobilize on a min guard basis with no device but unsteady and would benefit from some external support- tells me her house is too small to get through door ways with a RW, so may potentially benefit from a cane moving forward. Left up in recliner with all needs met, RN aware of patient status. Will benefit from skilled OP PT f/u moving forward.     Follow Up Recommendations Outpatient PT    Equipment Recommendations  3in1 (PT);Cane (house is too small to use a walker per her report)    Recommendations for Other Services       Precautions / Restrictions Precautions Precautions: Fall;Other (comment) Precaution Comments: watch SPO2 and HR Restrictions Weight Bearing Restrictions: No      Mobility  Bed Mobility               General bed mobility comments: up in recliner upon entry    Transfers Overall transfer level: Needs assistance Equipment used: None Transfers: Sit to/from Stand Sit to Stand: Min guard         General transfer comment: increased time and effort  Ambulation/Gait Ambulation/Gait assistance: Min guard Gait Distance (Feet): 120 Feet Assistive device: None Gait Pattern/deviations: Step-through pattern;Trendelenburg;Antalgic;Drifts right/left;Trunk flexed Gait velocity: decreased   General Gait Details: slow and mildly unsteady without device, intermittently reaching out for railing or counters around her. One standing rest break due to pain. SpO2  drop to 88% on 2LPM but able to recover to 90% and above with rest and PLB on 2LPM O2. HR to as much as 113BPM with mobility.   Stairs            Wheelchair Mobility    Modified Rankin (Stroke Patients Only)       Balance Overall balance assessment: Needs assistance Sitting-balance support: Feet supported;No upper extremity supported Sitting balance-Leahy Scale: Good     Standing balance support: No upper extremity supported;During functional activity   Standing balance comment: mildly unsteady and reaching out for railings and counters around her                             Pertinent Vitals/Pain Pain Assessment: Faces Faces Pain Scale: Hurts little more Pain Location: L sided abdominal pain Pain Descriptors / Indicators: Aching;Discomfort;Sore Pain Intervention(s): Limited activity within patient's tolerance;Monitored during session;Repositioned;Premedicated before session    Home Living Family/patient expects to be discharged to:: Private residence Living Arrangements: Spouse/significant other Available Help at Discharge: Family;Available PRN/intermittently Type of Home: House Home Access: Stairs to enter Entrance Stairs-Rails: Left Entrance Stairs-Number of Steps: 3 small steps with L ascending rail Home Layout: One level Home Equipment: Shower seat Additional Comments: house is too small to use a walker; might have a shower seat but not sure where its at. Does not wear O2 at baseline.    Prior Function Level of Independence: Independent               Hand   Dominance        Extremity/Trunk Assessment   Upper Extremity Assessment Upper Extremity Assessment: Generalized weakness    Lower Extremity Assessment Lower Extremity Assessment: Generalized weakness    Cervical / Trunk Assessment Cervical / Trunk Assessment: Kyphotic  Communication   Communication: No difficulties  Cognition Arousal/Alertness: Awake/alert Behavior During  Therapy: WFL for tasks assessed/performed Overall Cognitive Status: Within Functional Limits for tasks assessed                                        General Comments General comments (skin integrity, edema, etc.): quickly drops to 85% on room air at rest; dropped to 88% on 2LPM with activity but recovered to 90% and above with seated rest/PLB on 2LPM    Exercises     Assessment/Plan    PT Assessment Patient needs continued PT services  PT Problem List Decreased strength;Decreased knowledge of use of DME;Decreased activity tolerance;Decreased balance;Decreased mobility;Cardiopulmonary status limiting activity       PT Treatment Interventions DME instruction;Balance training;Gait training;Stair training;Functional mobility training;Patient/family education;Therapeutic activities;Therapeutic exercise    PT Goals (Current goals can be found in the Care Plan section)  Acute Rehab PT Goals Patient Stated Goal: less pain, go home when medically ready PT Goal Formulation: With patient Time For Goal Achievement: 09/13/20 Potential to Achieve Goals: Good    Frequency Min 3X/week   Barriers to discharge        Co-evaluation               AM-PAC PT "6 Clicks" Mobility  Outcome Measure Help needed turning from your back to your side while in a flat bed without using bedrails?: A Little Help needed moving from lying on your back to sitting on the side of a flat bed without using bedrails?: A Little Help needed moving to and from a bed to a chair (including a wheelchair)?: A Little Help needed standing up from a chair using your arms (e.g., wheelchair or bedside chair)?: A Little Help needed to walk in hospital room?: A Little Help needed climbing 3-5 steps with a railing? : A Little 6 Click Score: 18    End of Session Equipment Utilized During Treatment: Gait belt Activity Tolerance: Patient tolerated treatment well Patient left: in chair;with call bell/phone  within reach Nurse Communication: Mobility status;Other (comment) (ambulatory sats) PT Visit Diagnosis: Unsteadiness on feet (R26.81);Difficulty in walking, not elsewhere classified (R26.2);Muscle weakness (generalized) (M62.81)    Time: 0017-4944 PT Time Calculation (min) (ACUTE ONLY): 23 min   Charges:   PT Evaluation $PT Eval Moderate Complexity: 1 Mod PT Treatments $Gait Training: 8-22 mins        [name], DPT, PN1   Supplemental Physical Therapist [place]    Pager [phone] Acute Rehab Office [phone]

## 2020-08-30 NOTE — Progress Notes (Signed)
[facility]                                                                              Patient Demographics  Sierra Logan, is a 65 y.o. female, DOB - [birth year], VOZ:[id number]  Admit date - 08/28/2020   Admitting Physician Eben Burow, MD  Outpatient Primary MD for the patient is Patient, No Pcp Per  Outpatient specialists:   LOS - 2  days   Medical records reviewed and are as summarized below:    Chief Complaint  Patient presents with  . Abdominal Pain       Brief summary   Patient is a 65 year old female with history of cirrhosis, esophageal varices, peptic duodenitis, lower extremity edema presented to ED with LUQ pain and left-sided chest pain for 4 days.  Patient reported as intermittent pain, originally started in left upper quadrant, then radiated up to left shoulder.  No nausea vomiting, dizziness.  No history of CAD DVTs or PEs.  Patient takes Lasix for chronic pedal edema, no recent change in meds. Reported that she took the [employer] vaccine 6 months ago.  Has dyspnea on exertion for the past 1 year which has slowly gotten more pronounced. Chest x-ray unremarkable CT angio chest, showed no PE, cirrhosis.  3.4x 2.7 cm ill-defined area in the anterior spleen may represent mass or focal area of infarct.  6 mm nodule in the superior segment of the right lower lobe, noncontrast CT chest recommended 6 to 12 months. CT abdomen pelvis showed splenomegaly with 4 to 5 cm hypoperfused area presumably splenic infarction.  Assessment & Plan    Principal Problem: Acute abdominal pain likely secondary to splenic infarction -Presenting with acute left upper quadrant abdominal pain, left-sided chest pain -CT angio chest, CT abdomen pelvis with possibility of splenic infarct versus mass -Continue pain control, patient was placed on IV fluid hydration.  Lipase 41 -Hematology was consulted, appreciate recommendation, seen by Dr. Lorenso Courier, follow hypercoagulable  panel and supportive treatment -Discussed with on-call radiology, Dr. Candise Che, reviewed patient's CT imagings and appears to be splenic infarct, likely due to her history of cirrhosis.  No mesenteric ischemia and CT angiogram of the abdomen and pelvis will not add more value.  Discontinued CT angio abdomen. -Continue pain control.  Increase mobility, out of bed to chair, PT evaluation  Active Problems:   Iron deficiency anemia, microcytic anemia -Continue iron supplementation.  Fe profile showed iron 20, ferritin 8, saturation ratio 4 - Baseline hemoglobin 13-14 -Transfused 1 unit packed RBCs on 2/11 for hemoglobin 7.3 -No reports of GI bleed, hematemesis, hematochezia or melena. last colonoscopy was 2 years ago and was told that next colonoscopy after 3 more years.  FOBT negative -Hemoglobin stable at 8.0 today  Acute respiratory failure with hypoxia likely due to fluid overload -Not on O2 at home.  Currently on 2 L, home O2 evaluation done, O2 sats on room air 85% at rest, recovered up to 90% on 2 L O2 while ambulating -IV fluids discontinued, patient is on Lasix 40 mg daily at home, resume  History of liver cirrhosis -No history of liver cirrhosis with  esophageal varices.  Continue to follow outpatient PCP, no decompensated liver cirrhosis currently  Lung nodule -CT angio chest showed 6 mm lung nodule in right lower lobe, patient will need repeat CT in 6 to 12 months for further evaluation  Peripheral neuropathy -Continue gabapentin  Peripheral edema, hypertension -Resume Lasix  Hyperlipidemia Continue statin  Obesity Estimated body mass index is 37.5 kg/m as calculated from the following:   Height as of this encounter: 5\' 2"  (1.575 m).   Weight as of this encounter: 93 kg.  Code Status: Full CODE STATUS DVT Prophylaxis:  enoxaparin (LOVENOX) injection 40 mg Start: 08/29/20 1000   Level of Care: Level of care: Med-Surg Family Communication: Discussed all imaging  results, lab results, explained to the patient    Disposition Plan:     Status is: Inpatient  Remains inpatient appropriate because:Inpatient level of care appropriate due to severity of illness   Dispo: The patient is from: Home              Anticipated d/c is to: Home              Anticipated d/c date is: 2 days              Patient currently is not medically stable to d/c.  Possible DC home in a.m. if hypoxia improves, pain controlled and close to her baseline   Difficult to place patient No      Time Spent in minutes   35 minutes  Procedures:  None  Consultants:   Hematology oncology  Antimicrobials:   Anti-infectives (From admission, onward)   None         Medications  Scheduled Meds: . sodium chloride   Intravenous Once  . atorvastatin  10 mg Oral QHS  . docusate sodium  100 mg Oral BID  . enoxaparin (LOVENOX) injection  40 mg Subcutaneous Daily  . ferrous sulfate  325 mg Oral BID WC  . furosemide  40 mg Oral Daily  . gabapentin  400 mg Oral Daily   And  . gabapentin  1,200 mg Oral QHS  . multivitamin with minerals  1 tablet Oral Q breakfast  . nicotine  14 mg Transdermal Daily   Continuous Infusions:  PRN Meds:.acetaminophen **OR** acetaminophen, albuterol, HYDROmorphone (DILAUDID) injection, ondansetron **OR** ondansetron (ZOFRAN) IV, oxyCODONE      Subjective:   [place] was seen and examined today.  Left flank pain, LUQ pain 5/10, worse on ambulation, movement.  No nausea or vomiting.  No chest pain.  On O2 2 L at the time of my examination.  Patient denies dizziness, new weakness, numbess, tingling.   Objective:   Vitals:   08/29/20 2021 08/29/20 2335 08/30/20 0416 08/30/20 1215  BP: 126/66 (!) 122/58 (!) 144/69 131/71  Pulse: 91 94 98 100  Resp: 18 16 19 16   Temp: 98.4 F (36.9 C) 98.7 F (37.1 C) 98.6 F (37 C) 98.3 F (36.8 C)  TempSrc: Oral Oral Oral Oral  SpO2: 91% 94% 92% 90%  Weight: 93 kg     Height: 5\' 2"  (1.575 m)        Intake/Output Summary (Last 24 hours) at 08/30/2020 1452 Last data filed at 08/30/2020 0810 Gross per 24 hour  Intake 1241.25 ml  Output -  Net 1241.25 ml     Wt Readings from Last 3 Encounters:  08/29/20 93 kg   Physical Exam  General: Alert and oriented x 3, NAD  Cardiovascular: S1 S2 clear, RRR.  No pedal edema b/l  Respiratory: Decreased breath sound at the bases  Gastrointestinal: Soft, mild TTP in the left flank area, nondistended  Ext: no pedal edema bilaterally  Neuro: no new deficits  Musculoskeletal: No cyanosis, clubbing  Skin: No rashes  Psych: Normal affect and demeanor, alert and oriented x3    Data Reviewed:  I have personally reviewed following labs and imaging studies  Micro Results Recent Results (from the past 240 hour(s))  Resp Panel by RT-PCR (Flu A&B, Covid) Nasopharyngeal Swab     Status: None   Collection Time: 08/28/20  9:05 PM   Specimen: Nasopharyngeal Swab; Nasopharyngeal(NP) swabs in vial transport medium  Result Value Ref Range Status   SARS Coronavirus 2 by RT PCR NEGATIVE NEGATIVE Final    Comment: (NOTE) SARS-CoV-2 target nucleic acids are NOT DETECTED.  The SARS-CoV-2 RNA is generally detectable in upper respiratory specimens during the acute phase of infection. The lowest concentration of SARS-CoV-2 viral copies this assay can detect is 138 copies/mL. A negative result does not preclude SARS-Cov-2 infection and should not be used as the sole basis for treatment or other patient management decisions. A negative result may occur with  improper specimen collection/handling, submission of specimen other than nasopharyngeal swab, presence of viral mutation(s) within the areas targeted by this assay, and inadequate number of viral copies(<138 copies/mL). A negative result must be combined with clinical observations, patient history, and epidemiological information. The expected result is Negative.  Fact Sheet for Patients:   EntrepreneurPulse.com.au  Fact Sheet for Healthcare Providers:  IncredibleEmployment.be  This test is no t yet approved or cleared by the [place] FDA and  has been authorized for detection and/or diagnosis of SARS-CoV-2 by FDA under an Emergency Use Authorization (EUA). This EUA will remain  in effect (meaning this test can be used) for the duration of the COVID-19 declaration under Section 564(b)(1) of the Act, 21 U.S.C.section 360bbb-3(b)(1), unless the authorization is terminated  or revoked sooner.       Influenza A by PCR NEGATIVE NEGATIVE Final   Influenza B by PCR NEGATIVE NEGATIVE Final    Comment: (NOTE) The Xpert Xpress SARS-CoV-2/FLU/RSV plus assay is intended as an aid in the diagnosis of influenza from Nasopharyngeal swab specimens and should not be used as a sole basis for treatment. Nasal washings and aspirates are unacceptable for Xpert Xpress SARS-CoV-2/FLU/RSV testing.  Fact Sheet for Patients: EntrepreneurPulse.com.au  Fact Sheet for Healthcare Providers: IncredibleEmployment.be  This test is not yet approved or cleared by the [place] FDA and has been authorized for detection and/or diagnosis of SARS-CoV-2 by FDA under an Emergency Use Authorization (EUA). This EUA will remain in effect (meaning this test can be used) for the duration of the COVID-19 declaration under Section 564(b)(1) of the Act, 21 U.S.C. section 360bbb-3(b)(1), unless the authorization is terminated or revoked.  Performed at [facility], [place] [address] [place], [place] [address]     Radiology Reports DG Chest 2 View  Result Date: 08/27/2020 CLINICAL DATA:  Chest pain EXAM: CHEST - 2 VIEW COMPARISON:  05/17/2009 FINDINGS: There is mild bibasilar atelectasis. No superimposed focal pulmonary infiltrate. No pneumothorax or pleural effusion. Cardiac size within normal limits. Pulmonary vascularity  is normal. No acute bone abnormality. Subacute to remote left ninth rib fracture is identified laterally. IMPRESSION: No active cardiopulmonary disease. Electronically Signed   By: Fidela Salisbury MD   On: 08/27/2020 13:19   CT Angio Chest PE W and/or Wo Contrast  Result Date: 08/28/2020 CLINICAL DATA:  65 year old female with elevated D-dimer. Concern for pulmonary embolism. EXAM: CT ANGIOGRAPHY CHEST WITH CONTRAST TECHNIQUE: Multidetector CT imaging of the chest was performed using the standard protocol during bolus administration of intravenous contrast. Multiplanar CT image reconstructions and MIPs were obtained to evaluate the vascular anatomy. CONTRAST:  29mL OMNIPAQUE IOHEXOL 350 MG/ML SOLN COMPARISON:  Chest radiograph dated 08/27/2020. FINDINGS: Cardiovascular: There is no cardiomegaly or pericardial effusion. Three-vessel coronary vascular calcification. There is calcification of the mitral annulus. Mild atherosclerotic calcification of the thoracic aorta. No aneurysmal dilatation or dissection. The origins of the great vessels of the aortic arch appear patent as visualized. Evaluation of the pulmonary arteries is limited due to respiratory motion artifact and suboptimal opacification and timing of the contrast. No definite large or central pulmonary artery embolus identified. Mediastinum/Nodes: Mild enlargement of bilateral hilar lymph nodes measuring up to 13 mm in short axis on the right. Subcarinal lymph node measures approximately 11 mm. The esophagus is grossly unremarkable. No mediastinal fluid collection. Lungs/Pleura: Minimal bibasilar dependent atelectasis. There is a 6 mm nodule in the superior segment of the right lower lobe (56/6). No focal consolidation, pleural effusion or pneumothorax. The central airways are patent. Upper Abdomen: Cirrhosis. A 3.4 x 2.7 cm ill-defined low attenuating area in the anterior spleen is not characterized but may represent a mass or a focal area of infarct.  Musculoskeletal: Degenerative changes of the spine. No acute osseous pathology. Review of the MIP images confirms the above findings. IMPRESSION: 1. No acute intrathoracic pathology. No CT evidence of central pulmonary artery embolus. 2. Cirrhosis. 3. A 3.4 x 2.7 cm ill-defined low attenuating area in the anterior spleen may represent a mass or a focal area of infarct. 4. There is a 6 mm nodule in the superior segment of the right lower lobe. Non-contrast chest CT at 6-12 months is recommended. If the nodule is stable at time of repeat CT, then future CT at 18-24 months (from today's scan) is considered optional for low-risk patients, but is recommended for high-risk patients. This recommendation follows the consensus statement: Guidelines for Management of Incidental Pulmonary Nodules Detected on CT Images: From the Fleischner Society 2017; Radiology 2017; 284:228-243. 5. Aortic Atherosclerosis (ICD10-I70.0). Electronically Signed   By: Anner Crete M.D.   On: 08/28/2020 21:59   CT Abdomen Pelvis W Contrast  Result Date: 08/28/2020 CLINICAL DATA:  Left-sided abdominal pain over the last 3-4 days. Shortness of breath. EXAM: CT ABDOMEN AND PELVIS WITH CONTRAST TECHNIQUE: Multidetector CT imaging of the abdomen and pelvis was performed using the standard protocol following bolus administration of intravenous contrast. CONTRAST:  174mL OMNIPAQUE IOHEXOL 300 MG/ML  SOLN COMPARISON:  Ultrasound 03/20/2013 FINDINGS: Lower chest: Coronary artery calcification is noted. Globular calcification in the region of the mitral valve annulus. Heart size is normal. No pericardial effusion. There is a cluster of prominent lymph nodes the right of the heart in the pericardial fat, the largest node measuring 14 x 17 mm. There is mild atelectasis and or scarring at the left lung base and lingula. Hepatobiliary: Pronounced cirrhosis of the liver. No evidence of a mass lesion. No calcified gallstones. Pancreas: Normal Spleen:  Splenomegaly. 4-5 cm hypoperfused area in the anterior superior corner of the spleen presumably relating to a splenic infarction. This could explain the clinical presentation. Adrenals/Urinary Tract: Adrenal glands are normal. Kidneys are normal. Bladder is normal. Stomach/Bowel: The stomach and small intestine are normal. The appendix is normal. Diverticulosis of the left  colon without evidence of diverticulitis. Vascular/Lymphatic: Aortic atherosclerosis. Maximal diameter of the infrarenal abdominal aorta measures 3.2 cm. The IVC is normal. Mild nodal prominence throughout the retroperitoneum and in the porta hepatis. Reproductive: No pelvic mass. Other: No free fluid or air. Musculoskeletal: Chronic degenerative changes at the L5-S1 disc space. IMPRESSION: 1. Advanced cirrhosis of the liver. Splenomegaly. 4-5 cm hypoperfused area in the anterior superior corner of the spleen presumably relating to a splenic infarction. This could explain the clinical presentation. 2. Aortic atherosclerosis. Maximal diameter of the infrarenal abdominal aorta measures 3.2 cm. Recommend follow-up ultrasound every 3 years. This recommendation follows ACR consensus guidelines: White Paper of the ACR Incidental Findings Committee II on Vascular Findings. J Am Coll Radiol 2013; 10:789-794. 3. Nodal prominence in the pericardial station, the upper abdomen and the retroperitoneum, likely secondary to lymphatic congestion associated with cirrhosis. Aortic Atherosclerosis (ICD10-I70.0). Electronically Signed   By: Nelson Chimes M.D.   On: 08/28/2020 20:11    Lab Data:  CBC: Recent Labs  Lab 08/27/20 1344 08/28/20 1033 08/29/20 0054 08/29/20 0318 08/30/20 0252  WBC 11.4* 8.6 9.9 11.8* 9.1  HGB 8.1* 8.0* 7.3* 7.3* 8.0*  HCT 29.2* 29.6* 26.9* 26.7* 28.5*  MCV 75.6* 76.5* 76.0* 75.9* 78.5*  PLT 196 174 184 192 389   Basic Metabolic Panel: Recent Labs  Lab 08/27/20 1344 08/28/20 1033 08/29/20 0054 08/29/20 0318  08/30/20 0252  NA 133* 136  --  133* 133*  K 4.3 3.8  --  4.0 3.8  CL 95* 98  --  97* 97*  CO2 24 26  --  28 27  GLUCOSE 123* 141*  --  99 122*  BUN 7* 9  --  9 12  CREATININE 0.63 0.65 0.75 0.69 0.67  CALCIUM 8.9 9.2  --  8.6* 8.5*   GFR: Estimated Creatinine Clearance: 75.5 mL/min (by C-G formula based on SCr of 0.67 mg/dL). Liver Function Tests: Recent Labs  Lab 08/28/20 1033 08/30/20 0252  AST 31 31  ALT 26 22  ALKPHOS 148* 115  BILITOT 0.8 0.9  PROT 7.0 5.9*  ALBUMIN 3.3* 2.7*   Recent Labs  Lab 08/29/20 1227  LIPASE 41   No results for input(s): AMMONIA in the last 168 hours. Coagulation Profile: Recent Labs  Lab 08/28/20 2350  INR 1.1   Cardiac Enzymes: No results for input(s): CKTOTAL, CKMB, CKMBINDEX, TROPONINI in the last 168 hours. BNP (last 3 results) No results for input(s): PROBNP in the last 8760 hours. HbA1C: No results for input(s): HGBA1C in the last 72 hours. CBG: No results for input(s): GLUCAP in the last 168 hours. Lipid Profile: No results for input(s): CHOL, HDL, LDLCALC, TRIG, CHOLHDL, LDLDIRECT in the last 72 hours. Thyroid Function Tests: No results for input(s): TSH, T4TOTAL, FREET4, T3FREE, THYROIDAB in the last 72 hours. Anemia Panel: Recent Labs    08/28/20 1706  VITAMINB12 876  FOLATE 31.2  FERRITIN 8*  TIBC 466*  IRON 20*  RETICCTPCT 3.3*   Urine analysis:    Component Value Date/Time   COLORURINE YELLOW 08/29/2020 0020   APPEARANCEUR CLEAR 08/29/2020 0020   LABSPEC >1.046 (H) 08/29/2020 0020   PHURINE 6.0 08/29/2020 0020   GLUCOSEU NEGATIVE 08/29/2020 0020   HGBUR NEGATIVE 08/29/2020 0020   BILIRUBINUR NEGATIVE 08/29/2020 0020   KETONESUR NEGATIVE 08/29/2020 0020   PROTEINUR NEGATIVE 08/29/2020 0020   NITRITE NEGATIVE 08/29/2020 0020   LEUKOCYTESUR NEGATIVE 08/29/2020 0020     Sabrine Patchen M.D. [facility] 08/30/2020, 2:52 PM  Call night coverage person covering after 7pm

## 2020-08-31 ENCOUNTER — Inpatient Hospital Stay (HOSPITAL_COMMUNITY): Payer: 59

## 2020-08-31 DIAGNOSIS — R52 Pain, unspecified: Secondary | ICD-10-CM

## 2020-08-31 DIAGNOSIS — K7030 Alcoholic cirrhosis of liver without ascites: Secondary | ICD-10-CM | POA: Diagnosis not present

## 2020-08-31 DIAGNOSIS — D735 Infarction of spleen: Secondary | ICD-10-CM | POA: Diagnosis not present

## 2020-08-31 DIAGNOSIS — D500 Iron deficiency anemia secondary to blood loss (chronic): Secondary | ICD-10-CM | POA: Diagnosis not present

## 2020-08-31 DIAGNOSIS — R1010 Upper abdominal pain, unspecified: Secondary | ICD-10-CM | POA: Diagnosis not present

## 2020-08-31 LAB — CBC
HCT: 28.6 % — ABNORMAL LOW (ref 36.0–46.0)
Hemoglobin: 8.1 g/dL — ABNORMAL LOW (ref 12.0–15.0)
MCH: 22.3 pg — ABNORMAL LOW (ref 26.0–34.0)
MCHC: 28.3 g/dL — ABNORMAL LOW (ref 30.0–36.0)
MCV: 78.6 fL — ABNORMAL LOW (ref 80.0–100.0)
Platelets: 169 10*3/uL (ref 150–400)
RBC: 3.64 MIL/uL — ABNORMAL LOW (ref 3.87–5.11)
RDW: 21.8 % — ABNORMAL HIGH (ref 11.5–15.5)
WBC: 8.7 10*3/uL (ref 4.0–10.5)
nRBC: 0 % (ref 0.0–0.2)

## 2020-08-31 LAB — BASIC METABOLIC PANEL
Anion gap: 9 (ref 5–15)
BUN: 9 mg/dL (ref 8–23)
CO2: 31 mmol/L (ref 22–32)
Calcium: 8.3 mg/dL — ABNORMAL LOW (ref 8.9–10.3)
Chloride: 95 mmol/L — ABNORMAL LOW (ref 98–111)
Creatinine, Ser: 0.75 mg/dL (ref 0.44–1.00)
GFR, Estimated: >60 mL/min (ref >60)
Glucose, Bld: 102 mg/dL — ABNORMAL HIGH (ref 70–99)
Potassium: 3.6 mmol/L (ref 3.5–5.1)
Sodium: 135 mmol/L (ref 135–145)

## 2020-08-31 LAB — BETA-2-GLYCOPROTEIN I ABS, IGG/M/A
Beta-2 Glyco I IgG: <9 GPI IgG units (ref 0–20)
Beta-2-Glycoprotein I IgA: <9 GPI IgA units (ref 0–25)
Beta-2-Glycoprotein I IgM: <9 GPI IgM units (ref 0–32)

## 2020-08-31 MED ORDER — ONDANSETRON 4 MG PO TBDP: 4.0000 mg | ORAL_TABLET | Freq: Three times a day (TID) | ORAL | 0 refills | Status: DC | PRN

## 2020-08-31 MED ORDER — LIDOCAINE 5 % EX PTCH: 1.0000 | MEDICATED_PATCH | CUTANEOUS | 0 refills | Status: DC

## 2020-08-31 MED ORDER — UNABLE TO FIND: 0 refills | Status: DC

## 2020-08-31 MED ORDER — FERROUS SULFATE 325 (65 FE) MG PO TABS: 325.0000 mg | ORAL_TABLET | Freq: Two times a day (BID) | ORAL | 3 refills | Status: DC

## 2020-08-31 MED ORDER — OXYCODONE HCL 5 MG PO TABS: 5.0000 mg | ORAL_TABLET | ORAL | 0 refills | Status: DC | PRN

## 2020-08-31 MED ORDER — UNABLE TO FIND
0 refills | Status: DC
Start: 2020-08-31 — End: 2020-08-31

## 2020-08-31 MED ORDER — SENNOSIDES-DOCUSATE SODIUM 8.6-50 MG PO TABS: 2.0000 | ORAL_TABLET | Freq: Every day | ORAL | 0 refills | Status: DC

## 2020-08-31 MED ORDER — FUROSEMIDE 20 MG PO TABS: 20.0000 mg | ORAL_TABLET | Freq: Two times a day (BID) | ORAL | 3 refills | Status: DC

## 2020-08-31 MED ORDER — LIDOCAINE 5 % EX PTCH
1.0000 | MEDICATED_PATCH | CUTANEOUS | Status: DC
  Administered 2020-08-31: 1 via TRANSDERMAL
  Filled 2020-08-31: qty 1

## 2020-08-31 NOTE — Progress Notes (Signed)
VASCULAR LAB    Bilateral lower extremity venous duplex has been performed.  See CV proc for preliminary results.   Amanie Mcculley, RVT 08/31/2020, 9:14 AM

## 2020-08-31 NOTE — Care Management (Signed)
08-31-20 Case Manager spoke with patient and she declines the cane and 3n1 for home. Patient wanted to be set up with the outpatient physical therapy-agreeable to outpatient at the [facility] location. Office will call the patient to schedule an appointment. No further needs from Case Manager at this time. Graves-Bigelow, Ocie Cornfield, RN, BSN Case Manager

## 2020-08-31 NOTE — TOC Initial Note (Signed)
Transition of Care [facility]) - Initial/Assessment Note    Patient Details  Name: Sierra Logan MRN: [id number] Date of Birth: 1955/12/17  [facility]) CM/SW Contact:    Bethena Roys, RN Phone Number: 08/31/2020, 10:52 AM  Clinical Narrative: Plan for patient to transition home. Patient states she has support of family. Outpatient physical therapy has been arranged and patient is in need of oxygen for home. Case Manager spoke with patient and she is agreeable to Case Manager calling [employer] for durable medical equipment (DME). Oxygen will be delivered to the room prior to transition home. Patient will have transportation home via private vehicle. No further needs from Case Manager at this time.                  Expected Discharge Plan: Home/Self Care (arranged outpatient physical therapy) Barriers to Discharge: No Barriers Identified   Patient Goals and CMS Choice Patient states their goals for this hospitalization and ongoing recovery are:: to return home   Choice offered to / list presented to : NA  Expected Discharge Plan and Services Expected Discharge Plan: Home/Self Care (arranged outpatient physical therapy) In-house Referral: NA Discharge Planning Services: CM Consult Post Acute Care Choice: NA Living arrangements for the past 2 months: Single Family Home Expected Discharge Date: 08/31/20               DME Arranged: Oxygen DME Agency: [employer] Date DME Agency Contacted: 08/31/20 Time DME Agency Contacted: 59 Representative spoke with at DME Agency: [place]: NA          Prior Living Arrangements/Services Living arrangements for the past 2 months: [place] Lives with:: Spouse Patient language and need for interpreter reviewed:: Yes Do you feel safe going back to the place where you live?: Yes      Need for Family Participation in Patient Care: Yes (Comment) Care giver support system in place?: Yes (comment)    Criminal Activity/Legal Involvement Pertinent to Current Situation/Hospitalization: No - Comment as needed  Activities of Daily Living      Permission Sought/Granted Permission sought to share information with : Case Manager,Facility Contact Representative,Family Supports Permission granted to share information with : Yes, Verbal Permission Granted     Permission granted to share info w AGENCY: [employer]        Emotional Assessment Appearance:: Appears stated age Attitude/Demeanor/Rapport: Engaged Affect (typically observed): Appropriate Orientation: : Oriented to  Time,Oriented to Place,Oriented to Self,Oriented to Situation Alcohol / Substance Use: Not Applicable Psych Involvement: No (comment)  Admission diagnosis:  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 Iron deficiency anemia 08/28/2020  . 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 Abdominal pain 08/28/2020  . Peripheral neuropathy 08/28/2020  . Cirrhosis of liver ([place]) 08/28/2020  . Lung nodule 08/28/2020   PCP:  Patient, No Pcp Per Pharmacy:   [facility], [place] [address] [place] [address] Phone: [phone] Fax: [phone]   Readmission Risk Interventions No flowsheet data found.

## 2020-08-31 NOTE — Progress Notes (Signed)
SATURATION QUALIFICATIONS: (This note is used to comply with regulatory documentation for home oxygen)  Patient Saturations on Room Air at Rest = 86%  Patient Saturations on Room Air while Ambulating = 84%  Patient Saturations on 3 Liters of oxygen while Ambulating = 90%  Please briefly explain why patient needs home oxygen: Patient oxygen sats low on RA and when ambulating

## 2020-08-31 NOTE — Discharge Summary (Signed)
Physician Discharge Summary   Patient ID: Sierra Logan MRN: [id number] DOB/AGE: 65-Sep-1957 65 y.o.  Admit date: 08/28/2020 Discharge date: 08/31/2020  Primary Care Physician:  Patient, No Pcp Per   Recommendations for Outpatient Follow-up:  1. Patient is in the process of establishing with new PCP 2. Recommended following up with Dr. Lorenso Courier in next 1 to 2 weeks, ambulatory referral sent 3. CT angio chest showed 6 mm lung nodule in the right lower lobe, will need repeat CT in 6 to 12 months  Home Health: Outpatient PT Equipment/Devices: Patient declined to 3 n1, cane  Discharge Condition: stable  CODE STATUS: FULL  Diet recommendation: Heart healthy diet  SATURATION QUALIFICATIONS: (This note is used to comply with regulatory documentation for home oxygen)  Patient Saturations on Room Air at Rest = 86%  Patient Saturations on Room Air while Ambulating = 84%  Patient Saturations on 3 Liters of oxygen while Ambulating = 90%  Discharge Diagnoses:   Acute abdominal pain secondary to splenic infarction Microcytic, iron deficiency anemia Acute respiratory failure with hypoxia History of liver cirrhosis Right lower lobe lung nodule Peripheral edema Hypertension Peripheral neuropathy Obesity  Consults: Hematology oncology, Dr. Lorenso Courier    Allergies:  No Known Allergies   DISCHARGE MEDICATIONS: Allergies as of 08/31/2020   No Known Allergies     Medication List    TAKE these medications   acetaminophen 500 MG tablet Commonly known as: TYLENOL Take 500-1,000 mg by mouth every 8 (eight) hours as needed for mild pain or headache.   atorvastatin 10 MG tablet Commonly known as: LIPITOR Take 10 mg by mouth at bedtime.   BIOTIN PO Take 1 tablet by mouth daily with breakfast.   CALCIUM+D3 PO Take 1 tablet by mouth daily after breakfast.   ferrous sulfate 325 (65 FE) MG tablet Take 1 tablet (325 mg total) by mouth 2 (two) times daily with a meal.   FISH OIL  PO Take 1 capsule by mouth daily with breakfast.   furosemide 20 MG tablet Commonly known as: LASIX Take 1 tablet (20 mg total) by mouth 2 (two) times daily.   gabapentin 400 MG capsule Commonly known as: NEURONTIN Take 400-1,200 mg by mouth See admin instructions. Take 400 mg by mouth in the morning and 1,200 mg at bedtime   lidocaine 5 % Commonly known as: LIDODERM Place 1 patch onto the skin daily. Remove & Discard patch within 12 hours or as directed by MD   ondansetron 4 MG disintegrating tablet Commonly known as: Zofran ODT Take 1 tablet (4 mg total) by mouth every 8 (eight) hours as needed for nausea or vomiting.   ONE-A-DAY WOMENS FORMULA PO Take 1 tablet by mouth daily with breakfast.   oxyCODONE 5 MG immediate release tablet Commonly known as: Oxy IR/ROXICODONE Take 1 tablet (5 mg total) by mouth every 4 (four) hours as needed for moderate pain or severe pain.   senna-docusate 8.6-50 MG tablet Commonly known as: Senokot S Take 2 tablets by mouth at bedtime. For constipation, also available over-the-counter   UNABLE TO FIND OUTPATIENT PHYSICAL STREET    Diagnosis: generalized debilty, arthritis   VITAMIN E PO Take 1 capsule by mouth daily with breakfast.            Durable Medical Equipment  (From admission, onward)         Start     Ordered   08/31/20 1050  For home use only DME oxygen  Once  Question Answer Comment  Length of Need 6 Months   Mode or (Route) Nasal cannula   Liters per Minute 3   Frequency Continuous (stationary and portable oxygen unit needed)   Oxygen delivery system Gas      08/31/20 1049   08/31/20 0820  For home use only DME 3 n 1  Once        08/31/20 0819   08/31/20 0820  For home use only DME Cane  Once        08/31/20 0819           Brief H and P: For complete details please refer to admission H and P, but in brief Patient is a 65 year old female with history of cirrhosis, esophageal varices, peptic  duodenitis, lower extremity edema presented to ED with LUQ pain and left-sided chest pain for 4 days.  Patient reported as intermittent pain, originally started in left upper quadrant, then radiated up to left shoulder.  No nausea vomiting, dizziness.  No history of CAD DVTs or PEs.  Patient takes Lasix for chronic pedal edema, no recent change in meds. Reported that she took the [employer] vaccine 6 months ago.  Has dyspnea on exertion for the past 1 year which has slowly gotten more pronounced. Chest x-ray unremarkable CT angio chest, showed no PE, cirrhosis.  3.4x 2.7 cm ill-defined area in the anterior spleen may represent mass or focal area of infarct.  6 mm nodule in the superior segment of the right lower lobe, noncontrast CT chest recommended 6 to 12 months. CT abdomen pelvis showed splenomegaly with 4 to 5 cm hypoperfused area presumably splenic infarction.  Hospital Course:  Acute abdominal pain likely secondary to splenic infarction -Patient presented with acute left upper quadrant abdominal pain, left-sided chest pain -CT angio chest, CT abdomen pelvis with possibility of splenic infarct versus mass -Continue pain control, patient was placed on IV fluid hydration.  Lipase 41 -Hematology was consulted, seen by Dr. Lorenso Courier, recommended supportive treatment, pain control. Hypercoagulable panel sent  -Discussed with on-call radiology, Dr. Candise Che, reviewed patient's CT imagings and appears to be splenic infarct, likely due to her history of cirrhosis.  No mesenteric ischemia and CT angiogram of the abdomen and pelvis will not add more value.  -Abdominal pain is improving. Venous Doppler of the lower extremities negative for DVT -Continue pain control outpatient follow-up with Dr. Lorenso Courier     Iron deficiency anemia, microcytic anemia -Continue iron supplementation.  Fe profile showed iron 20, ferritin 8, saturation ratio 4 - Baseline hemoglobin 13-14 -Transfused 1 unit packed RBCs on 2/11  for hemoglobin 7.3 -No reports of GI bleed, hematemesis, hematochezia or melena. last colonoscopy was 2 years ago and was told that next colonoscopy after 3 more years.  FOBT negative -Hemoglobin stable at 8.1  Acute respiratory failure with hypoxia likely due to fluid overload -Not on O2 at home. Possibly due to fluid overload, patient was resumed on Lasix 40 mg daily. -Home O2 sats were checked, needs 3 L O2 via nasal cannula  History of liver cirrhosis -No history of liver cirrhosis with esophageal varices.  Continue to follow outpatient PCP, no decompensated liver cirrhosis currently  Lung nodule -CT angio chest showed 6 mm lung nodule in right lower lobe, patient will need repeat CT in 6 to 12 months for further evaluation  Peripheral neuropathy -Continue gabapentin  Peripheral edema, hypertension -Resume Lasix  Hyperlipidemia Continue statin  Pain in the calf right lower extremity  -Also states  this is a chronic issue with arthritis pain however possibility of hypercoagulability hence Doppler ultrasound of the lower extremities were checked and negative for DVT  Obesity Estimated body mass index is 37.5 kg/m as calculated from the following:   Height as of this encounter: 5\' 2"  (1.575 m).   Weight as of this encounter: 93 kg.  Day of Discharge S: No acute complaints, abdominal pain is improving, no fevers or chills. Ambulating  BP (!) 123/56 (BP Location: Left Wrist)   Pulse 94   Temp 98.4 F (36.9 C) (Oral)   Resp 16   Ht 5\' 2"  (1.575 m)   Wt 93 kg   SpO2 96%   BMI 37.50 kg/m   Physical Exam: General: Alert and awake oriented x3 not in any acute distress. HEENT: anicteric sclera, pupils reactive to light and accommodation CVS: S1-S2 clear no murmur rubs or gallops Chest: clear to auscultation bilaterally, no wheezing rales or rhonchi Abdomen: soft mild TTP left flank nondistended, normal bowel sounds Extremities: no cyanosis, clubbing or edema noted  bilaterally Neuro: Cranial nerves II-XII intact, no focal neurological deficits    Get Medicines reviewed and adjusted: Please take all your medications with you for your next visit with your Primary MD  Please request your Primary MD to go over all hospital tests and procedure/radiological results at the follow up. Please ask your Primary MD to get all Hospital records sent to his/her office.  If you experience worsening of your admission symptoms, develop shortness of breath, life threatening emergency, suicidal or homicidal thoughts you must seek medical attention immediately by calling 911 or calling your MD immediately  if symptoms less severe.  You must read complete instructions/literature along with all the possible adverse reactions/side effects for all the Medicines you take and that have been prescribed to you. Take any new Medicines after you have completely understood and accept all the possible adverse reactions/side effects.   Do not drive when taking pain medications.   Do not take more than prescribed Pain, Sleep and Anxiety Medications  Special Instructions: If you have smoked or chewed Tobacco  in the last 2 yrs please stop smoking, stop any regular Alcohol  and or any Recreational drug use.  Wear Seat belts while driving.  Please note  You were cared for by a hospitalist during your hospital stay. Once you are discharged, your primary care physician will handle any further medical issues. Please note that NO REFILLS for any discharge medications will be authorized once you are discharged, as it is imperative that you return to your primary care physician (or establish a relationship with a primary care physician if you do not have one) for your aftercare needs so that they can reassess your need for medications and monitor your lab values.   The results of significant diagnostics from this hospitalization (including imaging, microbiology, ancillary and laboratory) are  listed below for reference.      Procedures/Studies:  DG Chest 2 View  Result Date: 08/27/2020 CLINICAL DATA:  Chest pain EXAM: CHEST - 2 VIEW COMPARISON:  05/17/2009 FINDINGS: There is mild bibasilar atelectasis. No superimposed focal pulmonary infiltrate. No pneumothorax or pleural effusion. Cardiac size within normal limits. Pulmonary vascularity is normal. No acute bone abnormality. Subacute to remote left ninth rib fracture is identified laterally. IMPRESSION: No active cardiopulmonary disease. Electronically Signed   By: Fidela Salisbury MD   On: 08/27/2020 13:19   CT Angio Chest PE W and/or Wo Contrast  Result Date: 08/28/2020  CLINICAL DATA:  65 year old female with elevated D-dimer. Concern for pulmonary embolism. EXAM: CT ANGIOGRAPHY CHEST WITH CONTRAST TECHNIQUE: Multidetector CT imaging of the chest was performed using the standard protocol during bolus administration of intravenous contrast. Multiplanar CT image reconstructions and MIPs were obtained to evaluate the vascular anatomy. CONTRAST:  46mL OMNIPAQUE IOHEXOL 350 MG/ML SOLN COMPARISON:  Chest radiograph dated 08/27/2020. FINDINGS: Cardiovascular: There is no cardiomegaly or pericardial effusion. Three-vessel coronary vascular calcification. There is calcification of the mitral annulus. Mild atherosclerotic calcification of the thoracic aorta. No aneurysmal dilatation or dissection. The origins of the great vessels of the aortic arch appear patent as visualized. Evaluation of the pulmonary arteries is limited due to respiratory motion artifact and suboptimal opacification and timing of the contrast. No definite large or central pulmonary artery embolus identified. Mediastinum/Nodes: Mild enlargement of bilateral hilar lymph nodes measuring up to 13 mm in short axis on the right. Subcarinal lymph node measures approximately 11 mm. The esophagus is grossly unremarkable. No mediastinal fluid collection. Lungs/Pleura: Minimal bibasilar  dependent atelectasis. There is a 6 mm nodule in the superior segment of the right lower lobe (56/6). No focal consolidation, pleural effusion or pneumothorax. The central airways are patent. Upper Abdomen: Cirrhosis. A 3.4 x 2.7 cm ill-defined low attenuating area in the anterior spleen is not characterized but may represent a mass or a focal area of infarct. Musculoskeletal: Degenerative changes of the spine. No acute osseous pathology. Review of the MIP images confirms the above findings. IMPRESSION: 1. No acute intrathoracic pathology. No CT evidence of central pulmonary artery embolus. 2. Cirrhosis. 3. A 3.4 x 2.7 cm ill-defined low attenuating area in the anterior spleen may represent a mass or a focal area of infarct. 4. There is a 6 mm nodule in the superior segment of the right lower lobe. Non-contrast chest CT at 6-12 months is recommended. If the nodule is stable at time of repeat CT, then future CT at 18-24 months (from today's scan) is considered optional for low-risk patients, but is recommended for high-risk patients. This recommendation follows the consensus statement: Guidelines for Management of Incidental Pulmonary Nodules Detected on CT Images: From the Fleischner Society 2017; Radiology 2017; 284:228-243. 5. Aortic Atherosclerosis (ICD10-I70.0). Electronically Signed   By: Anner Crete M.D.   On: 08/28/2020 21:59   CT Abdomen Pelvis W Contrast  Result Date: 08/28/2020 CLINICAL DATA:  Left-sided abdominal pain over the last 3-4 days. Shortness of breath. EXAM: CT ABDOMEN AND PELVIS WITH CONTRAST TECHNIQUE: Multidetector CT imaging of the abdomen and pelvis was performed using the standard protocol following bolus administration of intravenous contrast. CONTRAST:  168mL OMNIPAQUE IOHEXOL 300 MG/ML  SOLN COMPARISON:  Ultrasound 03/20/2013 FINDINGS: Lower chest: Coronary artery calcification is noted. Globular calcification in the region of the mitral valve annulus. Heart size is normal. No  pericardial effusion. There is a cluster of prominent lymph nodes the right of the heart in the pericardial fat, the largest node measuring 14 x 17 mm. There is mild atelectasis and or scarring at the left lung base and lingula. Hepatobiliary: Pronounced cirrhosis of the liver. No evidence of a mass lesion. No calcified gallstones. Pancreas: Normal Spleen: Splenomegaly. 4-5 cm hypoperfused area in the anterior superior corner of the spleen presumably relating to a splenic infarction. This could explain the clinical presentation. Adrenals/Urinary Tract: Adrenal glands are normal. Kidneys are normal. Bladder is normal. Stomach/Bowel: The stomach and small intestine are normal. The appendix is normal. Diverticulosis of the left colon without evidence  of diverticulitis. Vascular/Lymphatic: Aortic atherosclerosis. Maximal diameter of the infrarenal abdominal aorta measures 3.2 cm. The IVC is normal. Mild nodal prominence throughout the retroperitoneum and in the porta hepatis. Reproductive: No pelvic mass. Other: No free fluid or air. Musculoskeletal: Chronic degenerative changes at the L5-S1 disc space. IMPRESSION: 1. Advanced cirrhosis of the liver. Splenomegaly. 4-5 cm hypoperfused area in the anterior superior corner of the spleen presumably relating to a splenic infarction. This could explain the clinical presentation. 2. Aortic atherosclerosis. Maximal diameter of the infrarenal abdominal aorta measures 3.2 cm. Recommend follow-up ultrasound every 3 years. This recommendation follows ACR consensus guidelines: White Paper of the ACR Incidental Findings Committee II on Vascular Findings. J Am Coll Radiol 2013; 10:789-794. 3. Nodal prominence in the pericardial station, the upper abdomen and the retroperitoneum, likely secondary to lymphatic congestion associated with cirrhosis. Aortic Atherosclerosis (ICD10-I70.0). Electronically Signed   By: Nelson Chimes M.D.   On: 08/28/2020 20:11   VAS [place] LOWER EXTREMITY VENOUS  (DVT)  Result Date: 08/31/2020  Lower Venous DVT Study Indications: Pain.  Comparison Study: No prior study on file Performing Technologist: Sharion Dove RVS  Examination Guidelines: A complete evaluation includes B-mode imaging, spectral Doppler, color Doppler, and power Doppler as needed of all accessible portions of each vessel. Bilateral testing is considered an integral part of a complete examination. Limited examinations for reoccurring indications may be performed as noted. The reflux portion of the exam is performed with the patient in reverse Trendelenburg.  +---------+---------------+---------+-----------+----------+--------------+ RIGHT    CompressibilityPhasicitySpontaneityPropertiesThrombus Aging +---------+---------------+---------+-----------+----------+--------------+ CFV      Full           Yes      Yes                                 +---------+---------------+---------+-----------+----------+--------------+ SFJ      Full                                                        +---------+---------------+---------+-----------+----------+--------------+ FV Prox  Full                                                        +---------+---------------+---------+-----------+----------+--------------+ FV Mid   Full                                                        +---------+---------------+---------+-----------+----------+--------------+ FV DistalFull                                                        +---------+---------------+---------+-----------+----------+--------------+ PFV      Full                                                        +---------+---------------+---------+-----------+----------+--------------+  POP      Full           Yes      Yes                                 +---------+---------------+---------+-----------+----------+--------------+ PTV      Full                                                         +---------+---------------+---------+-----------+----------+--------------+ PERO     Full                                                        +---------+---------------+---------+-----------+----------+--------------+   +---------+---------------+---------+-----------+----------+--------------+ LEFT     CompressibilityPhasicitySpontaneityPropertiesThrombus Aging +---------+---------------+---------+-----------+----------+--------------+ CFV      Full           Yes      Yes                                 +---------+---------------+---------+-----------+----------+--------------+ SFJ      Full                                                        +---------+---------------+---------+-----------+----------+--------------+ FV Prox  Full                                                        +---------+---------------+---------+-----------+----------+--------------+ FV Mid   Full                                                        +---------+---------------+---------+-----------+----------+--------------+ FV DistalFull                                                        +---------+---------------+---------+-----------+----------+--------------+ PFV      Full                                                        +---------+---------------+---------+-----------+----------+--------------+ POP      Full           Yes      Yes                                 +---------+---------------+---------+-----------+----------+--------------+  PTV      Full                                                        +---------+---------------+---------+-----------+----------+--------------+ PERO     Full                                                        +---------+---------------+---------+-----------+----------+--------------+     Summary: BILATERAL: - No evidence of deep vein thrombosis seen in the lower extremities, bilaterally. -   *See table(s)  above for measurements and observations.    Preliminary        LAB RESULTS: Basic Metabolic Panel: Recent Labs  Lab 08/30/20 0252 08/31/20 0618  NA 133* 135  K 3.8 3.6  CL 97* 95*  CO2 27 31  GLUCOSE 122* 102*  BUN 12 9  CREATININE 0.67 0.75  CALCIUM 8.5* 8.3*   Liver Function Tests: Recent Labs  Lab 08/28/20 1033 08/30/20 0252  AST 31 31  ALT 26 22  ALKPHOS 148* 115  BILITOT 0.8 0.9  PROT 7.0 5.9*  ALBUMIN 3.3* 2.7*   Recent Labs  Lab 08/29/20 1227  LIPASE 41   No results for input(s): AMMONIA in the last 168 hours. CBC: Recent Labs  Lab 08/30/20 0252 08/31/20 0618  WBC 9.1 8.7  HGB 8.0* 8.1*  HCT 28.5* 28.6*  MCV 78.5* 78.6*  PLT 171 169   Cardiac Enzymes: No results for input(s): CKTOTAL, CKMB, CKMBINDEX, TROPONINI in the last 168 hours. BNP: Invalid input(s): POCBNP CBG: No results for input(s): GLUCAP in the last 168 hours.     Disposition and Follow-up: Discharge Instructions    Ambulatory referral to Hematology / Oncology   Complete by: As directed    Admitted with splenic infarct, seen by Dr. Lorenso Courier while inpatient.  Need follow-up appointment in 2 weeks   Ambulatory referral to Physical Therapy   Complete by: As directed    Physical Therapy evaluation and treatment   Diet - low sodium heart healthy   Complete by: As directed    Increase activity slowly   Complete by: As directed        DISPOSITION: Home   DISCHARGE FOLLOW-UP  Follow-up Information    Orson Slick, MD. Schedule an appointment as soon as possible for a visit in 2 week(s).   Specialty: Hematology and Oncology Why: referral has been sent Contact information: [address] [address] [phone]        [facility] Follow up.   Specialty: Rehabilitation Why: Office will call you with an appointment- If the office does not call within 2 weeks, please call them.  Contact information: [address] [id number] [facility] Follow up.   Why: Oxygen- portable oxygen tank to be delivered to the room prior to transition home.  Contact information: [place] [address] [phone]                Time coordinating discharge:  40 minutes  Signed:   Estill Cotta M.D. [facility] 08/31/2020, 10:50 AM

## 2020-08-31 NOTE — Progress Notes (Signed)
Nsg Discharge Note  Patient discharging home. Husband present for teaching. Oxygen delivered to room. Script for outpatient PT given.  Admit Date:  08/28/2020 Discharge date: 08/31/2020   Sierra Logan to be D/C'd Home per MD order.  AVS completed.  Copy for chart, and copy for patient signed, and dated. Patient/caregiver able to verbalize understanding.  Discharge Medication: Allergies as of 08/31/2020   No Known Allergies     Medication List    TAKE these medications   acetaminophen 500 MG tablet Commonly known as: TYLENOL Take 500-1,000 mg by mouth every 8 (eight) hours as needed for mild pain or headache.   atorvastatin 10 MG tablet Commonly known as: LIPITOR Take 10 mg by mouth at bedtime.   BIOTIN PO Take 1 tablet by mouth daily with breakfast.   CALCIUM+D3 PO Take 1 tablet by mouth daily after breakfast.   ferrous sulfate 325 (65 FE) MG tablet Take 1 tablet (325 mg total) by mouth 2 (two) times daily with a meal.   FISH OIL PO Take 1 capsule by mouth daily with breakfast.   furosemide 20 MG tablet Commonly known as: LASIX Take 1 tablet (20 mg total) by mouth 2 (two) times daily.   gabapentin 400 MG capsule Commonly known as: NEURONTIN Take 400-1,200 mg by mouth See admin instructions. Take 400 mg by mouth in the morning and 1,200 mg at bedtime   lidocaine 5 % Commonly known as: LIDODERM Place 1 patch onto the skin daily. Remove & Discard patch within 12 hours or as directed by MD   ondansetron 4 MG disintegrating tablet Commonly known as: Zofran ODT Take 1 tablet (4 mg total) by mouth every 8 (eight) hours as needed for nausea or vomiting.   ONE-A-DAY WOMENS FORMULA PO Take 1 tablet by mouth daily with breakfast.   oxyCODONE 5 MG immediate release tablet Commonly known as: Oxy IR/ROXICODONE Take 1 tablet (5 mg total) by mouth every 4 (four) hours as needed for moderate pain or severe pain.   senna-docusate 8.6-50 MG tablet Commonly known as: Senokot  S Take 2 tablets by mouth at bedtime. For constipation, also available over-the-counter   UNABLE TO FIND OUTPATIENT PHYSICAL STREET    Diagnosis: generalized debilty, arthritis   VITAMIN E PO Take 1 capsule by mouth daily with breakfast.            Durable Medical Equipment  (From admission, onward)         Start     Ordered   08/31/20 1050  For home use only DME oxygen  Once       Question Answer Comment  Length of Need 6 Months   Mode or (Route) Nasal cannula   Liters per Minute 3   Frequency Continuous (stationary and portable oxygen unit needed)   Oxygen delivery system Gas      08/31/20 1049   08/31/20 0820  For home use only DME 3 n 1  Once        08/31/20 0819   08/31/20 0820  For home use only DME Cane  Once        08/31/20 0819          Discharge Assessment: Vitals:   08/30/20 2317 08/31/20 0505  BP: 131/68 (!) 123/56  Pulse: 86 94  Resp: 18 16  Temp: 98.6 F (37 C) 98.4 F (36.9 C)  SpO2: 95% 96%   Skin clean, dry and intact without evidence of skin break down, no evidence of  skin tears noted. IV catheter discontinued intact. Site without signs and symptoms of complications - no redness or edema noted at insertion site, patient denies c/o pain - only slight tenderness at site.  Dressing with slight pressure applied.  D/c Instructions-Education: Discharge instructions given to patient/family with verbalized understanding. D/c education completed with patient/family including follow up instructions, medication list, d/c activities limitations if indicated, with other d/c instructions as indicated by MD - patient able to verbalize understanding, all questions fully answered. Patient instructed to return to ED, call 911, or call MD for any changes in condition.  Patient escorted via [place], and D/C home via private auto.  Erasmo Leventhal, RN 08/31/2020 11:59 AM

## 2020-08-31 NOTE — Progress Notes (Signed)
Physical Therapy Treatment Patient Details Name: Sierra Logan MRN: [id number] DOB: 06/11/1956 Today's Date: 08/31/2020    History of Present Illness 65yo female c/o LLQ pain and L sided chest pain. 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 PMH neuropathy, chronic pedal edema, HLD    PT Comments    Pt tolerates treatment well progressing to stair negotiation this session. Pt ambulates with similar gait pattern with use of cane or without device, reports feeling more comfortable without device. Pt will benefit from continued gait and balance training in an effort to restore her prior level of function. PT continues to recommend outpatient PT services, no current DME needs.   Follow Up Recommendations  Outpatient PT     Equipment Recommendations  None recommended by PT    Recommendations for Other Services       Precautions / Restrictions Precautions Precautions: Fall;Other (comment) Precaution Comments: watch SPO2 and HR Restrictions Weight Bearing Restrictions: No    Mobility  Bed Mobility               General bed mobility comments: pt received and left sitting at edge of bed    Transfers Overall transfer level: Needs assistance Equipment used: None Transfers: Sit to/from Stand Sit to Stand: Supervision            Ambulation/Gait Ambulation/Gait assistance: Min guard (progressing to close supervision) Gait Distance (Feet): 120 Feet Assistive device: None Gait Pattern/deviations: Step-through pattern;Wide base of support Gait velocity: decreased Gait velocity interpretation: <1.8 ft/sec, indicate of risk for recurrent falls General Gait Details: pt ambulates initially with use of cane with increased sway, then attempts ambulation without cane, demonstrates similar gait pattern with widened BOS and increased sway but no losses of balance   Stairs Stairs: Yes Stairs assistance: Min guard Stair Management: Step to pattern;One  rail Right Number of Stairs: 3     Wheelchair Mobility    Modified Rankin (Stroke Patients Only)       Balance Overall balance assessment: Needs assistance Sitting-balance support: No upper extremity supported;Feet supported Sitting balance-Leahy Scale: Good     Standing balance support: No upper extremity supported Standing balance-Leahy Scale: Good                              Cognition Arousal/Alertness: Awake/alert Behavior During Therapy: WFL for tasks assessed/performed Overall Cognitive Status: Within Functional Limits for tasks assessed                                        Exercises      General Comments General comments (skin integrity, edema, etc.): pt received on 3L [place], ambulates with sats in mid-90s on 3L. PT reduces O2 to 2L and pt desats to 89% with activity. At rest pt maintains sats in mid 90s on 2L [place].      Pertinent Vitals/Pain Pain Assessment: Faces Faces Pain Scale: Hurts little more Pain Location: L abdominal pain Pain Descriptors / Indicators: Aching Pain Intervention(s): Monitored during session    Home Living                      Prior Function            PT Goals (current goals can now be found in the care plan section) Acute Rehab PT Goals  Patient Stated Goal: less pain, go home when medically ready Progress towards PT goals: Progressing toward goals    Frequency    Min 3X/week      PT Plan Current plan remains appropriate    Co-evaluation              AM-PAC PT "6 Clicks" Mobility   Outcome Measure  Help needed turning from your back to your side while in a flat bed without using bedrails?: A Little Help needed moving from lying on your back to sitting on the side of a flat bed without using bedrails?: A Little Help needed moving to and from a bed to a chair (including a wheelchair)?: A Little Help needed standing up from a chair using your arms (e.g., wheelchair or bedside  chair)?: A Little Help needed to walk in hospital room?: A Little Help needed climbing 3-5 steps with a railing? : A Little 6 Click Score: 18    End of Session   Activity Tolerance: Patient tolerated treatment well Patient left: in bed;with call bell/phone within reach;with family/visitor present Nurse Communication: Mobility status PT Visit Diagnosis: Unsteadiness on feet (R26.81);Difficulty in walking, not elsewhere classified (R26.2);Muscle weakness (generalized) (M62.81)     Time: 2831-5176 PT Time Calculation (min) (ACUTE ONLY): 12 min  Charges:  $Gait Training: 8-22 mins                     Zenaida Niece, PT, DPT Acute Rehabilitation Pager: [phone]    Zenaida Niece 08/31/2020, 12:19 PM

## 2020-09-01 ENCOUNTER — Telehealth: Payer: Self-pay | Admitting: Hematology

## 2020-09-01 LAB — TYPE AND SCREEN
ABO/RH(D): O POS
Antibody Screen: POSITIVE
PT AG Type: NEGATIVE
Unit division: 0
Unit division: 0

## 2020-09-01 LAB — BPAM RBC
Blood Product Expiration Date: 202203122359
Blood Product Expiration Date: 202203122359
ISSUE DATE / TIME: 202202111241
Unit Type and Rh: 5100
Unit Type and Rh: 5100

## 2020-09-01 LAB — FLOW CYTOMETRY REQUEST - FLUID (INPATIENT)

## 2020-09-01 LAB — SURGICAL PATHOLOGY

## 2020-09-01 NOTE — Telephone Encounter (Signed)
Received a new hem referral from Dr. Tana Coast at [facility] for splenic infaction. Sierra Logan has been cld and scheduled to see Dr. Irene Limbo on 2/24 at 1pm. Pt aware to arrive 20 minutes early.

## 2020-09-03 LAB — PROTEIN C, TOTAL: Protein C, Total: 76 % (ref 60–150)

## 2020-09-11 ENCOUNTER — Encounter: Payer: 59 | Admitting: Hematology

## 2020-09-11 NOTE — Progress Notes (Signed)
Sierra [phone]   [phone]  PROGRESS NOTE  Patient Care Team: Patient, No Pcp Per as PCP - General (General Practice)  Hematological/Oncological History # Splenic Infarct  08/27/2020: patient presented to [facility] ED with abdominal pain. CT abdomen showed advanced cirrhosis of the liver. Splenomegaly. 4-5 cm hypoperfused area in the anterior superior corner of the spleen presumably relating to a splenic infarction  #Iron Deficiency Anemia of Unclear Etiology, Likely GI Bleed 08/27/2020: WBC 11.4, Hgb 8.1, MCV 75.6, Plt 196 08/28/2020: Iron 20, TIBC 466, Sat 4%, Ferritin 8  Interval History:  Sierra Logan 65 y.o. female with medical history significant for splenic infarct and iron deficiency anemia who presents for a follow up visit. The patient was last seen on 08/29/2020 in the [facility] ED . In the interim since the last visit she has received 1 unit of packed red blood cells for hemoglobin 7.3 and was discharged from the hospital in stable condition.  On exam today Sierra Logan notes that she has had some residual left-sided discomfort since her discharge from the hospital.  She reports it has an occasional 7 out of 10 in severity.  She also does have some occasional discomfort on the right side of her abdomen.  This is not associated with any nausea, vomiting, or diarrhea.  She also denies any fevers, chills, sweats.  On further discussion she notes that she is never had an issue with bleeding or iron deficiency before in the past.  She was found to have varices on EGD performed at [facility] in the past.  She previously been followed by Dr. Earlean Shawl but is in wristed in transferring care to our GI.  She is currently on 3 L of oxygen.  She is currently taking p.o. iron twice daily and notes that she was having some diarrhea when she initially started this.  A full 10 point ROS is listed below.  MEDICAL HISTORY:  Past Medical  History:  Diagnosis Date  . Hyperlipemia   . Neuropathy     SURGICAL HISTORY: No past surgical history on file.  SOCIAL HISTORY: Social History   Socioeconomic History  . Marital status: Married    Spouse name: Not on file  . Number of children: Not on file  . Years of education: Not on file  . Highest education level: Not on file  Occupational History  . Not on file  Tobacco Use  . Smoking status: Current Every Day Smoker  . Smokeless tobacco: Never Used  Substance and Sexual Activity  . Alcohol use: Not Currently  . Drug use: Not on file  . Sexual activity: Not on file  Other Topics Concern  . Not on file  Social History Narrative  . Not on file   Social Determinants of Health   Financial Resource Strain: Not on file  Food Insecurity: Not on file  Transportation Needs: Not on file  Physical Activity: Not on file  Stress: Not on file  Social Connections: Not on file  Intimate Partner Violence: Not on file    FAMILY HISTORY: No family history on file.  ALLERGIES:  has No Known Allergies.  MEDICATIONS:  Current Outpatient Medications  Medication Sig Dispense Refill  . acetaminophen (TYLENOL) 500 MG tablet Take 500-1,000 mg by mouth every 8 (eight) hours as needed for mild pain or headache.    Marland Kitchen atorvastatin (LIPITOR) 10 MG tablet Take 10 mg by mouth at bedtime.    Marland Kitchen BIOTIN  PO Take 1 tablet by mouth daily with breakfast.    . Calcium Carb-Cholecalciferol (CALCIUM+D3 PO) Take 1 tablet by mouth daily after breakfast.    . ferrous sulfate 325 (65 FE) MG tablet Take 1 tablet (325 mg total) by mouth 2 (two) times daily with a meal. 60 tablet 3  . furosemide (LASIX) 20 MG tablet Take 1 tablet (20 mg total) by mouth 2 (two) times daily. 60 tablet 3  . gabapentin (NEURONTIN) 400 MG capsule Take 400-1,200 mg by mouth See admin instructions. Take 400 mg by mouth in the morning and 1,200 mg at bedtime    . lidocaine (LIDODERM) 5 % Place 1 patch onto the skin daily. Remove  & Discard patch within 12 hours or as directed by MD 30 patch 0  . Multiple Vitamins-Calcium (ONE-A-DAY WOMENS FORMULA PO) Take 1 tablet by mouth daily with breakfast.    . Omega-3 Fatty Acids (FISH OIL PO) Take 1 capsule by mouth daily with breakfast.    . UNABLE TO FIND OUTPATIENT PHYSICAL STREET    Diagnosis: generalized debilty, arthritis 1 Mutually Defined 0  . VITAMIN E PO Take 1 capsule by mouth daily with breakfast.    . ondansetron (ZOFRAN ODT) 4 MG disintegrating tablet Take 1 tablet (4 mg total) by mouth every 8 (eight) hours as needed for nausea or vomiting. (Patient not taking: Reported on 09/12/2020) 20 tablet 0  . oxyCODONE (OXY IR/ROXICODONE) 5 MG immediate release tablet Take 1 tablet (5 mg total) by mouth every 4 (four) hours as needed for moderate pain or severe pain. (Patient not taking: Reported on 09/12/2020) 30 tablet 0  . senna-docusate (SENOKOT S) 8.6-50 MG tablet Take 2 tablets by mouth at bedtime. For constipation, also available over-the-counter (Patient not taking: Reported on 09/12/2020) 60 tablet 0   No current facility-administered medications for this visit.    REVIEW OF SYSTEMS:   Constitutional: ( - ) fevers, ( - )  chills , ( - ) night sweats Eyes: ( - ) blurriness of vision, ( - ) double vision, ( - ) watery eyes Ears, nose, mouth, throat, and face: ( - ) mucositis, ( - ) sore throat Respiratory: ( - ) cough, ( - ) dyspnea, ( - ) wheezes Cardiovascular: ( - ) palpitation, ( - ) chest discomfort, ( - ) lower extremity swelling Gastrointestinal:  ( - ) nausea, ( - ) heartburn, ( - ) change in bowel habits Skin: ( - ) abnormal skin rashes Lymphatics: ( - ) new lymphadenopathy, ( - ) easy bruising Neurological: ( - ) numbness, ( - ) tingling, ( - ) new weaknesses Behavioral/Psych: ( - ) mood change, ( - ) new changes  All other systems were reviewed with the patient and are negative.  PHYSICAL EXAMINATION: ECOG PERFORMANCE STATUS: 1 - Symptomatic but  completely ambulatory  Vitals:   09/12/20 1254  BP: (!) 122/56  Pulse: 97  Resp: 18  Temp: 99.2 F (37.3 C)  SpO2: 100%   Filed Weights   09/12/20 1254  Weight: 202 lb 4.8 oz (91.8 kg)    GENERAL: well appearing  alert, no distress and comfortable SKIN: skin color, texture, turgor are normal, no rashes or significant lesions EYES: conjunctiva are pink and non-injected, sclera clear LUNGS: clear to auscultation and percussion with normal breathing effort HEART: regular rate & rhythm and no murmurs and no lower extremity edema Musculoskeletal: no cyanosis of digits and no clubbing  PSYCH: alert & oriented x 3, fluent  speech NEURO: no focal motor/sensory deficits  LABORATORY DATA:  I have reviewed the data as listed CBC Latest Ref Rng & Units 08/31/2020 08/30/2020 08/29/2020  WBC 4.0 - 10.5 K/uL 8.7 9.1 11.8(H)  Hemoglobin 12.0 - 15.0 g/dL 8.1(L) 8.0(L) 7.3(L)  Hematocrit 36.0 - 46.0 % 28.6(L) 28.5(L) 26.7(L)  Platelets 150 - 400 K/uL 169 171 192    CMP Latest Ref Rng & Units 08/31/2020 08/30/2020 08/29/2020  Glucose 70 - 99 mg/dL 102(H) 122(H) 99  BUN 8 - 23 mg/dL 9 12 9   Creatinine 0.44 - 1.00 mg/dL 0.75 0.67 0.69  Sodium 135 - 145 mmol/L 135 133(L) 133(L)  Potassium 3.5 - 5.1 mmol/L 3.6 3.8 4.0  Chloride 98 - 111 mmol/L 95(L) 97(L) 97(L)  CO2 22 - 32 mmol/L 31 27 28   Calcium 8.9 - 10.3 mg/dL 8.3(L) 8.5(L) 8.6(L)  Total Protein 6.5 - 8.1 g/dL - 5.9(L) -  Total Bilirubin 0.3 - 1.2 mg/dL - 0.9 -  Alkaline Phos 38 - 126 U/L - 115 -  AST 15 - 41 U/L - 31 -  ALT 0 - 44 U/L - 22 -    RADIOGRAPHIC STUDIES: DG Chest 2 View  Result Date: 08/27/2020 CLINICAL DATA:  Chest pain EXAM: CHEST - 2 VIEW COMPARISON:  05/17/2009 FINDINGS: There is mild bibasilar atelectasis. No superimposed focal pulmonary infiltrate. No pneumothorax or pleural effusion. Cardiac size within normal limits. Pulmonary vascularity is normal. No acute bone abnormality. Subacute to remote left ninth rib fracture  is identified laterally. IMPRESSION: No active cardiopulmonary disease. Electronically Signed   By: Fidela Salisbury MD   On: 08/27/2020 13:19   CT Angio Chest PE W and/or Wo Contrast  Result Date: 08/28/2020 CLINICAL DATA:  65 year old female with elevated D-dimer. Concern for pulmonary embolism. EXAM: CT ANGIOGRAPHY CHEST WITH CONTRAST TECHNIQUE: Multidetector CT imaging of the chest was performed using the standard protocol during bolus administration of intravenous contrast. Multiplanar CT image reconstructions and MIPs were obtained to evaluate the vascular anatomy. CONTRAST:  56mL OMNIPAQUE IOHEXOL 350 MG/ML SOLN COMPARISON:  Chest radiograph dated 08/27/2020. FINDINGS: Cardiovascular: There is no cardiomegaly or pericardial effusion. Three-vessel coronary vascular calcification. There is calcification of the mitral annulus. Mild atherosclerotic calcification of the thoracic aorta. No aneurysmal dilatation or dissection. The origins of the great vessels of the aortic arch appear patent as visualized. Evaluation of the pulmonary arteries is limited due to respiratory motion artifact and suboptimal opacification and timing of the contrast. No definite large or central pulmonary artery embolus identified. Mediastinum/Nodes: Mild enlargement of bilateral hilar lymph nodes measuring up to 13 mm in short axis on the right. Subcarinal lymph node measures approximately 11 mm. The esophagus is grossly unremarkable. No mediastinal fluid collection. Lungs/Pleura: Minimal bibasilar dependent atelectasis. There is a 6 mm nodule in the superior segment of the right lower lobe (56/6). No focal consolidation, pleural effusion or pneumothorax. The central airways are patent. Upper Abdomen: Cirrhosis. A 3.4 x 2.7 cm ill-defined low attenuating area in the anterior spleen is not characterized but may represent a mass or a focal area of infarct. Musculoskeletal: Degenerative changes of the spine. No acute osseous pathology.  Review of the MIP images confirms the above findings. IMPRESSION: 1. No acute intrathoracic pathology. No CT evidence of central pulmonary artery embolus. 2. Cirrhosis. 3. A 3.4 x 2.7 cm ill-defined low attenuating area in the anterior spleen may represent a mass or a focal area of infarct. 4. There is a 6 mm nodule in the superior  segment of the right lower lobe. Non-contrast chest CT at 6-12 months is recommended. If the nodule is stable at time of repeat CT, then future CT at 18-24 months (from today's scan) is considered optional for low-risk patients, but is recommended for high-risk patients. This recommendation follows the consensus statement: Guidelines for Management of Incidental Pulmonary Nodules Detected on CT Images: From the Fleischner Society 2017; Radiology 2017; 284:228-243. 5. Aortic Atherosclerosis (ICD10-I70.0). Electronically Signed   By: Anner Crete M.D.   On: 08/28/2020 21:59   CT Abdomen Pelvis W Contrast  Result Date: 08/28/2020 CLINICAL DATA:  Left-sided abdominal pain over the last 3-4 days. Shortness of breath. EXAM: CT ABDOMEN AND PELVIS WITH CONTRAST TECHNIQUE: Multidetector CT imaging of the abdomen and pelvis was performed using the standard protocol following bolus administration of intravenous contrast. CONTRAST:  173mL OMNIPAQUE IOHEXOL 300 MG/ML  SOLN COMPARISON:  Ultrasound 03/20/2013 FINDINGS: Lower chest: Coronary artery calcification is noted. Globular calcification in the region of the mitral valve annulus. Heart size is normal. No pericardial effusion. There is a cluster of prominent lymph nodes the right of the heart in the pericardial fat, the largest node measuring 14 x 17 mm. There is mild atelectasis and or scarring at the left lung base and lingula. Hepatobiliary: Pronounced cirrhosis of the liver. No evidence of a mass lesion. No calcified gallstones. Pancreas: Normal Spleen: Splenomegaly. 4-5 cm hypoperfused area in the anterior superior corner of the spleen  presumably relating to a splenic infarction. This could explain the clinical presentation. Adrenals/Urinary Tract: Adrenal glands are normal. Kidneys are normal. Bladder is normal. Stomach/Bowel: The stomach and small intestine are normal. The appendix is normal. Diverticulosis of the left colon without evidence of diverticulitis. Vascular/Lymphatic: Aortic atherosclerosis. Maximal diameter of the infrarenal abdominal aorta measures 3.2 cm. The IVC is normal. Mild nodal prominence throughout the retroperitoneum and in the porta hepatis. Reproductive: No pelvic mass. Other: No free fluid or air. Musculoskeletal: Chronic degenerative changes at the L5-S1 disc space. IMPRESSION: 1. Advanced cirrhosis of the liver. Splenomegaly. 4-5 cm hypoperfused area in the anterior superior corner of the spleen presumably relating to a splenic infarction. This could explain the clinical presentation. 2. Aortic atherosclerosis. Maximal diameter of the infrarenal abdominal aorta measures 3.2 cm. Recommend follow-up ultrasound every 3 years. This recommendation follows ACR consensus guidelines: White Paper of the ACR Incidental Findings Committee II on Vascular Findings. J Am Coll Radiol 2013; 10:789-794. 3. Nodal prominence in the pericardial station, the upper abdomen and the retroperitoneum, likely secondary to lymphatic congestion associated with cirrhosis. Aortic Atherosclerosis (ICD10-I70.0). Electronically Signed   By: Nelson Chimes M.D.   On: 08/28/2020 20:11   VAS [place] LOWER EXTREMITY VENOUS (DVT)  Result Date: 08/31/2020  Lower Venous DVT Study Indications: Pain.  Comparison Study: No prior study on file Performing Technologist: Sharion Dove RVS  Examination Guidelines: A complete evaluation includes B-mode imaging, spectral Doppler, color Doppler, and power Doppler as needed of all accessible portions of each vessel. Bilateral testing is considered an integral part of a complete examination. Limited examinations for  reoccurring indications may be performed as noted. The reflux portion of the exam is performed with the patient in reverse Trendelenburg.  +---------+---------------+---------+-----------+----------+--------------+ RIGHT    CompressibilityPhasicitySpontaneityPropertiesThrombus Aging +---------+---------------+---------+-----------+----------+--------------+ CFV      Full           Yes      Yes                                 +---------+---------------+---------+-----------+----------+--------------+  SFJ      Full                                                        +---------+---------------+---------+-----------+----------+--------------+ FV Prox  Full                                                        +---------+---------------+---------+-----------+----------+--------------+ FV Mid   Full                                                        +---------+---------------+---------+-----------+----------+--------------+ FV DistalFull                                                        +---------+---------------+---------+-----------+----------+--------------+ PFV      Full                                                        +---------+---------------+---------+-----------+----------+--------------+ POP      Full           Yes      Yes                                 +---------+---------------+---------+-----------+----------+--------------+ PTV      Full                                                        +---------+---------------+---------+-----------+----------+--------------+ PERO     Full                                                        +---------+---------------+---------+-----------+----------+--------------+   +---------+---------------+---------+-----------+----------+--------------+ LEFT     CompressibilityPhasicitySpontaneityPropertiesThrombus Aging  +---------+---------------+---------+-----------+----------+--------------+ CFV      Full           Yes      Yes                                 +---------+---------------+---------+-----------+----------+--------------+ SFJ      Full                                                        +---------+---------------+---------+-----------+----------+--------------+  FV Prox  Full                                                        +---------+---------------+---------+-----------+----------+--------------+ FV Mid   Full                                                        +---------+---------------+---------+-----------+----------+--------------+ FV DistalFull                                                        +---------+---------------+---------+-----------+----------+--------------+ PFV      Full                                                        +---------+---------------+---------+-----------+----------+--------------+ POP      Full           Yes      Yes                                 +---------+---------------+---------+-----------+----------+--------------+ PTV      Full                                                        +---------+---------------+---------+-----------+----------+--------------+ PERO     Full                                                        +---------+---------------+---------+-----------+----------+--------------+     Summary: BILATERAL: - No evidence of deep vein thrombosis seen in the lower extremities, bilaterally. -   *See table(s) above for measurements and observations. Electronically signed by Harold Barban MD on 08/31/2020 at 7:19:20 PM.    Final     ASSESSMENT & PLAN Sierra Logan 65 y.o. female with medical history significant for splenic infarct and iron deficiency anemia who presents for a follow up visit.   After review the labs, the records, schedule the patient the findings most consistent  with a splenic infarct of unclear etiology and iron deficiency anemia.  The most likely etiology of the patient's iron deficiency anemia is GI bleeding.  Given her history of cirrhosis I am concerned that this may be secondary to oozing of the variceal veins in her esophagus.  I have recommended that she establish care with [facility] given that she would like to transfer care to a local GI provider.   In regards to her splenic infarct the etiology is unclear  but may be secondary to the splenomegaly due to cirrhosis.  We performed a hypercoagulable work-up which was negative and therefore adenopathy there is any specific interventions required.  I would recommend continual symptom management.  Patient voiced understanding of this plan moving forward.  # Splenic Infarct  --hypercoagulable workup was negative, no evidence of a hypercoagulable disease --recommend best supportive care at this time --can consider repeat imaging in approximately 3 months time.   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  --RTC for labs in 4 weeks and clinic visit in 3 months.   Orders Placed This Encounter  Procedures  . Ambulatory referral to Gastroenterology    Referral Priority:   Urgent    Referral Type:   Consultation    Referral Reason:   Specialty Services Required    Number of Visits Requested:   1    All questions were answered. The patient knows to call the clinic with any problems, questions or concerns.  A total of more than 30 minutes were spent on this encounter and over half of that time was spent on counseling and coordination of care as outlined above.   Ledell Peoples, MD Department of  Hematology/Oncology [place] [facility] Phone: [phone] Pager: [phone] Email: [email or web address]  09/12/2020 4:34 PM

## 2020-09-12 ENCOUNTER — Encounter: Payer: Self-pay | Admitting: Hematology and Oncology

## 2020-09-12 ENCOUNTER — Other Ambulatory Visit: Payer: Self-pay

## 2020-09-12 ENCOUNTER — Inpatient Hospital Stay: Payer: 59 | Attending: Hematology and Oncology | Admitting: Hematology and Oncology

## 2020-09-12 VITALS — BP 122/56 | HR 97 | Temp 99.2°F | Resp 18 | Ht 62.0 in | Wt 202.3 lb

## 2020-09-12 DIAGNOSIS — D509 Iron deficiency anemia, unspecified: Secondary | ICD-10-CM | POA: Diagnosis present

## 2020-09-12 DIAGNOSIS — D735 Infarction of spleen: Secondary | ICD-10-CM | POA: Insufficient documentation

## 2020-09-12 DIAGNOSIS — D500 Iron deficiency anemia secondary to blood loss (chronic): Secondary | ICD-10-CM

## 2020-09-12 DIAGNOSIS — R911 Solitary pulmonary nodule: Secondary | ICD-10-CM | POA: Diagnosis not present

## 2020-09-12 DIAGNOSIS — F17210 Nicotine dependence, cigarettes, uncomplicated: Secondary | ICD-10-CM | POA: Diagnosis not present

## 2020-09-12 DIAGNOSIS — E785 Hyperlipidemia, unspecified: Secondary | ICD-10-CM | POA: Insufficient documentation

## 2020-09-12 DIAGNOSIS — Z79899 Other long term (current) drug therapy: Secondary | ICD-10-CM | POA: Diagnosis not present

## 2020-09-16 ENCOUNTER — Telehealth: Payer: Self-pay | Admitting: Hematology and Oncology

## 2020-09-16 NOTE — Telephone Encounter (Signed)
Scheduled per 02/25 los, patient has been called and notified.

## 2020-09-18 ENCOUNTER — Telehealth: Payer: Self-pay

## 2020-09-18 NOTE — Telephone Encounter (Signed)
Hey Dr Rush Landmark, this pt is being referred to see [place] for Iron deficiency anemia due to chronic blood loss from the [facility] but it looks like she saw Dr Earlean Shawl [facility]) on 05/2020, records are in epic for review, please advise on scheduling. (She is requesting [place] due to insurance purposes)

## 2020-09-19 NOTE — Telephone Encounter (Signed)
Pt scheduled to see [name] 09/30/20 at 10:50am. Please call pt with appt.

## 2020-09-19 NOTE — Telephone Encounter (Signed)
Patient's chart reviewed. 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  She has been on ursodiol for years it seems. Has had prior endoscopic work-up with an EGD and colonoscopy with last EGD within a year and that showed varices and peptic duodenitis as well as a colonoscopy in 2016 with a TA. Her anemia is significant with iron deficiency. I am happy to meet the patient.  She can be scheduled on one of my upcoming clinic visits in the next 2 to 3 weeks.  She can be scheduled at a 930/1050/250 PM slot that is usually held for me but I am okay with using this. It looks like she is on oxygen, so any endoscopic evaluation including an EGD/colonoscopy will need to be performed in the hospital due to O2 requirements. If she wants to remain with [facility] that is fine as well but let me know what she decides and move forward with getting her scheduled in my clinic.  Thanks. GM

## 2020-09-22 ENCOUNTER — Ambulatory Visit: Payer: 59 | Admitting: Physical Therapy

## 2020-09-24 ENCOUNTER — Ambulatory Visit: Payer: 59 | Attending: Internal Medicine | Admitting: Physical Therapy

## 2020-09-24 ENCOUNTER — Encounter: Payer: Self-pay | Admitting: Physical Therapy

## 2020-09-24 ENCOUNTER — Other Ambulatory Visit: Payer: Self-pay

## 2020-09-24 DIAGNOSIS — Z7409 Other reduced mobility: Secondary | ICD-10-CM | POA: Diagnosis present

## 2020-09-25 NOTE — Patient Instructions (Signed)
Access Code: [email or web address]: [email or web address]: 03/10/2022Prepared by: Anderson Malta PaaExercises  Supine Bridge - 1 x daily - 7 x weekly - 2 sets - 10 reps - 5 hold  Supine Active Straight Leg Raise - 1 x daily - 7 x weekly - 2 sets - 10 reps - 5 hold  Supine Lower Trunk Rotation - 1 x daily - 7 x weekly - 2 sets - 10 reps - 10 hold  Sit to Stand - 1 x daily - 7 x weekly - 3 sets - 10 reps - 5 hold  Ankle Pumps in Elevation - 1 x daily - 7 x weekly - 2 sets - 10 reps - 30 hold

## 2020-09-25 NOTE — Therapy (Signed)
[place], [place], [address] Phone: [phone]   Fax:  [phone]  Physical Therapy Evaluation  Patient Details  Name: Sierra Logan MRN: [id number] Date of Birth: Aug 14, 1955 Referring Provider (PT): Dr. Tana Coast, Ripudeep   Encounter Date: 09/24/2020   PT End of Session - 09/25/20 0740    Visit Number 1    PT Start Time 2947    PT Stop Time 1120    PT Time Calculation (min) 35 min    Equipment Utilized During Treatment Oxygen    Activity Tolerance Patient tolerated treatment well    Behavior During Therapy [facility] for tasks assessed/performed           Past Medical History:  Diagnosis Date  . Hyperlipemia   . Neuropathy     History reviewed. No pertinent surgical history.  There were no vitals filed for this visit.    Subjective Assessment - 09/24/20 1056    Subjective Patient was admitted and discharged patient home.  She is now on O2 at home for a temporary time. She denies weakness, difficulty with ADLs.  She is not quite sure why she is here but she is agreeable to initial PT eval.  She has no pain.  Denies balance issues.    Currently in Pain? No/denies              [facility] PT Assessment - 09/25/20 0001      Assessment   Medical Diagnosis splenic infarction with deconditioning    Referring Provider (PT) Dr. 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    Leisure only 1-2 days work per week, limited  leisure      Cognition   Overall Cognitive Status Within Functional Limits for tasks assessed      Observation/Other Assessments   Focus on Therapeutic Outcomes (FOTO)  NT , 1 time visit      Observation/Other Assessments-Edema    Edema --   ankle swelling, chronic 2 yrs     Sensation   Light Touch Appears Intact    Additional Comments reports neuropathy in feet, swelling      Coordination   Gross Motor Movements are Fluid and Coordinated Not tested      Posture/Postural Control   Posture/Postural Control Postural limitations    Postural Limitations Rounded Shoulders;Forward head;Increased thoracic kyphosis      AROM   Overall AROM Comments WFL in hips, knees, shoulders      Strength   Overall Strength Comments WFL throughout      Flexibility   Soft Tissue Assessment /Muscle Length --   mild stiffness in hips bilateral     Transfers   Five time sit to stand comments  16 sec      6 minute walk test results    Endurance additional comments 2 min walk  test 343, HR 120's, no distress, O2 sats 95-98%                      Objective measurements completed on examination: See above findings.       [facility] Adult PT Treatment/Exercise - 09/25/20 0001      Self-Care   Other Self-Care Comments  HEP, performed 5-10 reps of HEP given today, breathing for recovery , importance of exercise, LE swelling , elevation                  PT Education - 09/25/20 0739    Education Details PT/POC, HEP , basic endurance    Person(s) Educated Patient    Methods Explanation;Handout    Comprehension Verbalized understanding;Returned demonstration            PT Short Term Goals - 09/25/20 0741      PT SHORT TERM GOAL #1   Title Pt will be given HEP and info on basic exercise, endurance    Time 1    Period Days    Status New    Target Date 09/24/20                     Plan - 09/25/20 0741    Clinical Impression Statement Patient here for low  complexity eval due to recent hospitalization from splenic infarction.  She reports she is back to her baseline of mobility prior to this issue.  Her strength is [facility] and has no pain.  She is on O2 at this time but expects to be off that in about 2 weeks.  She was given basic HEP for hip strength and LE swelling.  No further PT warranted at this time.    Personal Factors and Comorbidities Comorbidity 1    Comorbidities neuropathy    Stability/Clinical Decision Making Stable/Uncomplicated    Clinical Decision Making Low    Rehab Potential Excellent    PT Frequency One time visit    PT Next Visit Plan NA    PT Home Exercise Plan Access Code: [email or web address]  URL: [email or web address]  Date: 09/25/2020  Prepared by: Raeford Razor    Exercises  .Supine Bridge - 1 x daily - 7 x weekly - 2 sets - 10 reps - 5 hold  .Supine Active Straight Leg Raise - 1 x daily - 7 x weekly - 2 sets - 10 reps - 5 hold  .Supine Lower Trunk Rotation - 1 x daily - 7 x weekly - 2 sets - 10 reps - 10 hold  .Sit to Stand - 1 x daily - 7 x weekly - 3 sets - 10 reps - 5 hold  .Ankle Pumps in Elevation - 1 x daily - 7 x weekly - 2 sets - 10 reps - 30 hold    Consulted and Agree with Plan of Care Patient           Patient will benefit from skilled therapeutic intervention in order to improve the following deficits and impairments:  Other (comment) (reports no difficulty)  Visit Diagnosis: Decreased functional mobility and endurance     Problem List Patient Active Problem List   Diagnosis Date Noted  . Iron deficiency anemia 08/28/2020  . 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 Abdominal pain 08/28/2020  . Peripheral neuropathy 08/28/2020  . Cirrhosis of liver ([place]) 08/28/2020  . Lung nodule 08/28/2020    Karalynn Cottone 09/25/2020, 7:51 AM  [place]  [place], [place], [address] Phone: [phone]   Fax:  [phone]  Name: NAHDIA DOUCET MRN:  [id number] Date of Birth: [birth year]   Raeford Razor, PT 09/25/20 7:51 AM Phone: [phone] Fax: [phone]

## 2020-09-30 ENCOUNTER — Encounter: Payer: Self-pay | Admitting: Gastroenterology

## 2020-09-30 ENCOUNTER — Other Ambulatory Visit (INDEPENDENT_AMBULATORY_CARE_PROVIDER_SITE_OTHER): Payer: 59

## 2020-09-30 ENCOUNTER — Ambulatory Visit (INDEPENDENT_AMBULATORY_CARE_PROVIDER_SITE_OTHER): Payer: 59 | Admitting: Gastroenterology

## 2020-09-30 VITALS — BP 120/64 | HR 100 | Ht 61.75 in | Wt 205.4 lb

## 2020-09-30 DIAGNOSIS — R0902 Hypoxemia: Secondary | ICD-10-CM

## 2020-09-30 DIAGNOSIS — K7030 Alcoholic cirrhosis of liver without ascites: Secondary | ICD-10-CM

## 2020-09-30 DIAGNOSIS — K743 Primary biliary cirrhosis: Secondary | ICD-10-CM

## 2020-09-30 DIAGNOSIS — K7681 Hepatopulmonary syndrome: Secondary | ICD-10-CM | POA: Diagnosis not present

## 2020-09-30 DIAGNOSIS — R195 Other fecal abnormalities: Secondary | ICD-10-CM

## 2020-09-30 DIAGNOSIS — D509 Iron deficiency anemia, unspecified: Secondary | ICD-10-CM

## 2020-09-30 LAB — PROTIME-INR
INR: 1.2 ratio — ABNORMAL HIGH (ref 0.8–1.0)
Prothrombin Time: 13.3 s — ABNORMAL HIGH (ref 9.6–13.1)

## 2020-09-30 LAB — COMPREHENSIVE METABOLIC PANEL
ALT: 31 U/L (ref 0–35)
AST: 41 U/L — ABNORMAL HIGH (ref 0–37)
Albumin: 3.6 g/dL (ref 3.5–5.2)
Alkaline Phosphatase: 135 U/L — ABNORMAL HIGH (ref 39–117)
BUN: 5 mg/dL — ABNORMAL LOW (ref 6–23)
CO2: 35 mEq/L — ABNORMAL HIGH (ref 19–32)
Calcium: 9.4 mg/dL (ref 8.4–10.5)
Chloride: 96 mEq/L (ref 96–112)
Creatinine, Ser: 0.71 mg/dL (ref 0.40–1.20)
GFR: 89.83 mL/min (ref >60.00)
Glucose, Bld: 102 mg/dL — ABNORMAL HIGH (ref 70–99)
Potassium: 3.4 mEq/L — ABNORMAL LOW (ref 3.5–5.1)
Sodium: 139 mEq/L (ref 135–145)
Total Bilirubin: 0.6 mg/dL (ref 0.2–1.2)
Total Protein: 7.2 g/dL (ref 6.0–8.3)

## 2020-09-30 LAB — CBC
HCT: 35.5 % — ABNORMAL LOW (ref 36.0–46.0)
Hemoglobin: 11.5 g/dL — ABNORMAL LOW (ref 12.0–15.0)
MCHC: 32.4 g/dL (ref 30.0–36.0)
MCV: 81.4 fl (ref 78.0–100.0)
Platelets: 137 10*3/uL — ABNORMAL LOW (ref 150.0–400.0)
RBC: 4.37 Mil/uL (ref 3.87–5.11)
RDW: 24.6 % — ABNORMAL HIGH (ref 11.5–15.5)
WBC: 8.3 10*3/uL (ref 4.0–10.5)

## 2020-09-30 LAB — TSH: TSH: 6.9 u[IU]/mL — ABNORMAL HIGH (ref 0.35–4.50)

## 2020-09-30 LAB — IBC + FERRITIN
Ferritin: 24.7 ng/mL (ref 10.0–291.0)
Iron: 44 ug/dL (ref 42–145)
Saturation Ratios: 12.6 % — ABNORMAL LOW (ref 20.0–50.0)
Transferrin: 249 mg/dL (ref 212.0–360.0)

## 2020-09-30 MED ORDER — SUPREP BOWEL PREP KIT 17.5-3.13-1.6 GM/177ML PO SOLN: 1.0000 | ORAL | 0 refills | Status: DC

## 2020-09-30 NOTE — H&P (View-Only) (Signed)
[place] VISIT   Primary Care Provider Pieter Partridge, [place] [address] [place] [address] [phone]  Referring Provider Dr. Lorenso Courier  Patient Profile: Sierra Logan is a 64 y.o. 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  The patient presents to the [facility] for an evaluation and management of problem(s) noted below:  Problem List 1. Iron deficiency anemia, unspecified iron deficiency anemia type   2. Primary biliary cholangitis ([place])   3. 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 Hypoxemia   5. Concern for possible hepatopulmonary syndrome (HCC)   6. Dark stools     History of Present Illness This is the patient's first visit the [facility].  Patient was previously followed by Dr. Richmond Campbell of [place].  Reading through the chart, the patient has an established diagnosis of PBC based on liver biopsy.  She has evidence of portal hypertension manifested with esophageal varices in the setting of a endoscopy in April 2021.  She was initiated on ursodiol with slight improvement in her alkaline phosphatase and maintained on twice daily ursodiol.  Patient had been following with Dr. Earlean Shawl for abdominal pain that would come and go of unclear etiology.  The plan was to continue ursodiol and repeat evaluation in 1 year and to perform a FibroScan at 1 year to see how she would do.  Plan for colonoscopy follow-up in 2024 per report.  The patient was admitted in February 2 [facility] with a splenic infarct.  She followed up with Dr. Lorenso Courier in hematology for evaluation of the splenic infarct as well as iron deficiency anemia.  She required packed RBC transfusion during  her hospital stay as well as after.  Upon her evaluation the patient was still having abdominal pain in the left side as well as her chronic right-sided abdominal pain.  She is taking twice daily oral iron.  It was felt that the splenic infarct was of an unclear etiology.  Concern for anemia was felt to be GI bleeding in the patient wanted to establish with a new gastroenterologist.  The patient states that she has episodes of abdominal discomfort that occur in the mid epigastrium as well as right upper quadrant at times.  They come and go.  Etiology has been unremarkable for years.  In September and October of last year she developed episodes of black and tarry stools on a few occasions.  She was only recently initiated on iron after her hospital stay.  She denies any bright red blood per rectum.  She has a bowel movement twice daily.  Stool was formed.  The patient denies any issues with jaundice, scleral icterus, generalized pruritus, darkened/amber urine, clay-colored stools, LE edema, hematemesis, coffee-ground emesis, abdominal distention, confusion.  The biggest issue currently is her continued oxygen use for which she remains on 3 L of O2 and has not down titrated that and does not have follow-up with pulmonary either.  GI Review of Systems Positive as above including infrequent episodes of pyrosis Negative for dysphagia, odynophagia, bloating, hematochezia  Review of Systems General: Denies fevers/chills/weight loss unintentionally HEENT: Denies oral lesions Cardiovascular: Denies chest pain/palpitations Pulmonary: Positive for shortness of breath/cough though she feels stronger than when she was hospitalized but remains on oxygen and is not  sure when she will be able to come off of this Gastroenterological: See HPI Genitourinary: Denies darkened urine Hematological: Denies easy bruising/bleeding Endocrine: Denies temperature intolerance Dermatological: Denies jaundice Psychological:  Mood is stable   Medications Current Outpatient Medications  Medication Sig Dispense Refill  . acetaminophen (TYLENOL) 500 MG tablet Take 500-1,000 mg by mouth every 8 (eight) hours as needed for mild pain or headache.    Marland Kitchen atorvastatin (LIPITOR) 10 MG tablet Take 10 mg by mouth at bedtime.    Marland Kitchen BIOTIN PO Take 1 tablet by mouth daily with breakfast.    . Calcium Carb-Cholecalciferol (CALCIUM+D3 PO) Take 1 tablet by mouth daily after breakfast.    . Cholecalciferol (VITAMIN D3 PO) Take 1 tablet by mouth daily.    . ferrous sulfate 325 (65 FE) MG tablet Take 1 tablet (325 mg total) by mouth 2 (two) times daily with a meal. 60 tablet 3  . furosemide (LASIX) 20 MG tablet Take 1 tablet (20 mg total) by mouth 2 (two) times daily. 60 tablet 3  . gabapentin (NEURONTIN) 400 MG capsule Take 400-1,200 mg by mouth See admin instructions. Take 400 mg by mouth in the morning and 1,200 mg at bedtime    . Multiple Vitamins-Calcium (ONE-A-DAY WOMENS FORMULA PO) Take 1 tablet by mouth daily with breakfast.    . Na Sulfate-K Sulfate-Mg Sulf (SUPREP BOWEL PREP KIT) 17.5-3.13-1.6 GM/177ML SOLN Take 1 kit by mouth as directed. For colonoscopy prep 354 mL 0  . Omega-3 Fatty Acids (FISH OIL PO) Take 1 capsule by mouth daily with breakfast.    . ondansetron (ZOFRAN ODT) 4 MG disintegrating tablet Take 1 tablet (4 mg total) by mouth every 8 (eight) hours as needed for nausea or vomiting. 20 tablet 0  . oxyCODONE (OXY IR/ROXICODONE) 5 MG immediate release tablet Take 1 tablet (5 mg total) by mouth every 4 (four) hours as needed for moderate pain or severe pain. 30 tablet 0  . OXYGEN Inhale 3 L/min into the lungs continuous.    Marland Kitchen UNABLE TO FIND OUTPATIENT PHYSICAL STREET    Diagnosis: generalized debilty, arthritis 1 Mutually Defined 0  . VITAMIN E PO Take 1 capsule by mouth daily with breakfast.    . ursodiol (ACTIGALL) 500 MG tablet Take 500 mg by mouth in the morning and at bedtime. (Patient not taking: Reported on  09/30/2020)     No current facility-administered medications for this visit.    Allergies No Known Allergies  Histories Past Medical History:  Diagnosis Date  . Cirrhosis ([place])   . Hyperlipemia   . Hypertension   . Neuropathy   . Spleen disease    Past Surgical History:  Procedure Laterality Date  . RECONSTRUCTION OF NOSE    . TRACHEOSTOMY    . TRACHEOSTOMY CLOSURE     Social History   Socioeconomic History  . Marital status: Married    Spouse name: Not on file  . Number of children: 0  . Years of education: Not on file  . Highest education level: Not on file  Occupational History  . Occupation: part time courier  Tobacco Use  . Smoking status: Former Smoker    Types: Cigarettes    Quit date: 09/02/2020    Years since quitting: 0.0  . Smokeless tobacco: Never Used  Vaping Use  . Vaping Use: Never used  Substance and Sexual Activity  . Alcohol use: Not Currently  . Drug use: Never  . Sexual activity: Not on file  Other Topics  Concern  . Not on file  Social History Narrative  . Not on file   Social Determinants of Health   Financial Resource Strain: Not on file  Food Insecurity: Not on file  Transportation Needs: Not on file  Physical Activity: Not on file  Stress: Not on file  Social Connections: Not on file  Intimate Partner Violence: Not on file   Family History  Problem Relation Age of Onset  . GER disease Mother   . Heart disease Mother   . Diabetes Father   . Hypertension Sister   . Hypertension Brother   . Prostate cancer Maternal Grandfather   . Hypertension Brother   . Hypertension Brother   . Hypertension Brother   . Colon cancer Neg Hx   . Esophageal cancer Neg Hx   . Kidney disease Neg Hx   . Liver disease Neg Hx   . Pancreatic cancer Neg Hx   . Rectal cancer Neg Hx   . Stomach cancer Neg Hx    I have reviewed her medical, social, and family history in detail and updated the electronic medical record as necessary.    PHYSICAL  EXAMINATION  BP 120/64 (BP Location: Left Arm, Patient Position: Sitting, Cuff Size: Normal)   Pulse 100   Ht 5' 1.75" (1.568 m) Comment: height measured without shoes  Wt 205 lb 6 oz (93.2 kg)   BMI 37.87 kg/m  Wt Readings from Last 3 Encounters:  09/30/20 205 lb 6 oz (93.2 kg)  09/12/20 202 lb 4.8 oz (91.8 kg)  08/29/20 205 lb 0.4 oz (93 kg)  GEN: NAD, appears older than stated age and chronically ill, but is nontoxic  PSYCH: Cooperative, without pressured speech EYE: Conjunctivae pink, sclerae anicteric ENT: MMM, without oral ulcers CV: Nontachycardic RESP: No audible wheezing GI: NABS, soft, protuberant abdomen, mild tenderness to palpation in midepigastrium and left upper quadrant, hepatosplenomegaly is appreciated today, no rebound MSK/EXT: Trace bilateral lower extremity edema is present SKIN: Spider angiomata noted on upper thorax; no jaundice NEURO:  Alert & Oriented x 3, no focal deficits, no evidence of asterixis   REVIEW OF DATA  I reviewed the following data at the time of this encounter:  GI Procedures and Studies  Reported 2016 colonoscopy with tubular adenoma with plan for a 5-year interval in 2024 is not clear why that would be the case but is documented in the chart  April 2021 EGD Grade 1 esophageal varices, distal esophagitis, reactive gastropathy, peptic duodenitis Pathology A. DUODENUM, BIOPSY:       Duodenal mucosa with foveolar metaplasia, Brunner gland  hyperplasia and chronic inflammation     consistent with peptic  duodenitis.       Negative for malignancy.  B. STOMACH, ANTRUM, BIOPSY:       Reactive gastropathy.       Negative for Helicobacter Pylori on routine H&E stain.       Negative for intestinal metaplasia or malignancy.   February 2021 liver biopsy FINAL PATHOLOGIC DIAGNOSIS  MICROSCOPIC EXAMINATION AND DIAGNOSIS   LIVER, BIOPSY:       Focal portal chronic inflammation with interface activity,  active bile duct injury, and bile  ductular proliferation.       Mild steatohepatitis.       Focal portal and periportal fibrosis.       See comment and microscopic description.   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   It is noted that the patient also had elevated ANA (1:640,  05/08/2019) raising the possibility of a coexisting autoimmune  component although the smooth muscle antibody was negative  (05/23/2019) and ALT/AST were normal (11/42020).  Correlation with  serum IgG4 and quantitative serum test might be considered to  rule out a coexisting IgG4 disease or autoimmune hepatitis.   Laboratory Studies  Reviewed those in epic and care everywhere  August 2021 labs AST/ALT 29/19 Alk phos 151 Total bili 0.3 Albumin 3.7 Total protein 7.2 Sodium 126  2/21 labs INR 1.0  2/21 labs Hemoglobin/hematocrit 12.1/35.4 MCV 89.4 Platelets 192 WBC 9.21 May 2019 labs ANA 1-6 40 speckled pattern Hepatitis B surface antibody nonreactive Hepatitis B core antibody reactive Hepatitis B surface antigen nonreactive Hepatitis C antibody negative Hepatitis A IgG and IgM negative Copper 1.71 mcg/mL (1.45 upper limit normal) Ceruloplasmin 38.8 (upper limit of normal 51.0) AMA M2 greater than 3 Alpha-1 antitrypsin normal Anti-smooth muscle antibody negative Anti-LK M1 antibody negative  Imaging Studies  February 2022 CT abdomen pelvis with contrast IMPRESSION: 1. Advanced cirrhosis of the liver. Splenomegaly. 4-5 cm hypoperfused area in the anterior superior corner of the spleen presumably relating to a splenic infarction. This could explain the clinical presentation. 2. Aortic atherosclerosis. Maximal diameter of the infrarenal abdominal aorta measures 3.2 cm. Recommend follow-up ultrasound every 3 years. This  recommendation follows ACR consensus guidelines: White Paper of the ACR Incidental Findings Committee II on Vascular Findings. J Am Coll Radiol 2013; 10:789-794. 3. Nodal prominence in the pericardial station, the upper abdomen and the retroperitoneum, likely secondary to lymphatic congestion associated with cirrhosis.  Aortic Atherosclerosis (ICD10-I70.0).  February 2022 CT chest PE protocol IMPRESSION: 1. No acute intrathoracic pathology. No CT evidence of central pulmonary artery embolus. 2. Cirrhosis. 3. A 3.4 x 2.7 cm ill-defined low attenuating area in the anterior spleen may represent a mass or a focal area of infarct. 4. There is a 6 mm nodule in the superior segment of the right lower lobe. Non-contrast chest CT at 6-12 months is recommended. If the nodule is stable at time of repeat CT, then future CT at 18-24 months (from today's scan) is considered optional for low-risk patients, but is recommended for high-risk patients. This recommendation follows the consensus statement: Guidelines for Management of Incidental Pulmonary Nodules Detected on CT Images: From the Fleischner Society 2017; Radiology 2017; 284:228-243. 5. Aortic Atherosclerosis (ICD10-I70.0).   ASSESSMENT  Ms. Pudwill is a 65 y.o. 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  The patient is seen today for evaluation and management of:  1. Iron deficiency anemia, unspecified iron deficiency anemia type   2. Primary biliary cholangitis ([place])   3. 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 Hypoxemia   5. Concern for possible hepatopulmonary syndrome (HCC)   6. Dark stools    The patient is hemodynamically stable at this time.  However, she does have evidence of advanced liver disease.  When  speaking with her, she states this is the first time that anyone has truly discussed and discussed it her underlying liver disease to this extent.  Patient feels like she has a better understanding but is also very concerned.  Patient has significant issues in regards  to anemia and iron deficiency.  With known prior esophageal varices and portal hypertension she is at risk for etiologies of iron deficiency anemia occurring.  She may be at risk of bleeding from EVs or from portal gastropathy or the development of GAVE or AVMs.  The potential for other underlying etiologies in the colon and small bowel must also be considered.  Diagnostic upper and lower endoscopy at minimum are to be considered.  We will need to do these procedures in the hospital-based setting because of the persistence of her hypoxemia.  During her clinic visit, we had her stop her oxygen for 10 minutes and she did have mild stability in the low 90-91 range but as soon as she got up to walk to the chair from the examination table her O2 sats dropped into the 80s.  No overt platypnea or orthopnea were noted.  However, the significant persistence of hypoxemia is concerning.  A TTE with bubble study to rule out hepatopulmonary syndrome should be considered and is ordered.  She will continue her ursodiol at this time but if her alkaline phosphatase remains elevated we may need to consider other therapies or potential referral to one of the quaternary centers.  It is not clear that she is a candidate for liver transplantation nor is she sure she would ever want that at this time but is we are barely establishing her currently that is something we will need to think about in the future.  We will get laboratories from an anemia perspective as well and see how her liver tests look currently.  The risks and benefits of endoscopic evaluation were discussed with the patient; these include but are not limited to the risk of perforation, infection, bleeding,  missed lesions, lack of diagnosis, severe illness requiring hospitalization, as well as anesthesia and sedation related illnesses.  The patient is agreeable to proceed.  All patient questions were answered to the best of my ability, and the patient agrees to the aforementioned plan of action with follow-up as indicated.   PLAN  Laboratories as outlined below TTE with bubble study to be obtained Pulmonary referral to be placed Diagnostic EGD/colonoscopy to be performed in hospital-based setting  #ESLD Management Volume -No diuretics currently -Obtain daily standing weight -1500-2000 mg Na diet Infection -No evidence of SBP currently Bleeding -We will plan to proceed with EGD for variceal evaluation/follow-up Encephalopathy -No evidence of at this time Screening -Up-to-date based on 2/22 CT abdomen/pelvis and will need repeat evaluation by August 2022 Transplant -Not clear that she needs or has a high enough meld or even wants at this time Vaccination -She will be recommended HAV and HBV vaccination/immunization in the near future after follow-up Other -Promote intake of 1.5 g/kg/day of Protein (Ensures/Boosts at each meal)    Orders Placed This Encounter  Procedures  . Procedural/ Surgical Case Request: COLONOSCOPY WITH PROPOFOL, ESOPHAGOGASTRODUODENOSCOPY (EGD) WITH PROPOFOL  . CBC  . Comp Met (CMET)  . TSH  . IBC + Ferritin  . Tissue transglutaminase, IgA  . INR/PT  . Mitochondrial antibodies  . IgA  . Ambulatory referral to Gastroenterology  . Ambulatory referral to Pulmonology  . ECHOCARDIOGRAM COMPLETE BUBBLE STUDY    New Prescriptions   NA SULFATE-K SULFATE-MG SULF (SUPREP BOWEL PREP KIT) 17.5-3.13-1.6 GM/177ML SOLN    Take 1 kit by mouth as directed. For colonoscopy prep   Modified Medications   No medications on file    Planned Follow Up No follow-ups on file.  Total Time in Face-to-Face and in Coordination of Care for patient including  independent/personal interpretation/review of prior testing, medical history, examination, medication adjustment, communicating results with the patient directly, and documentation with the EHR is 50 minutes.   Justice Britain, MD [facility] Office # [phone]

## 2020-09-30 NOTE — Patient Instructions (Signed)
If you are age 65 or older, your body mass index should be between 23-30. Your Body mass index is 37.87 kg/m. If this is out of the aforementioned range listed, please consider follow up with your Primary Care Provider.  If you are age 7 or younger, your body mass index should be between 19-25. Your Body mass index is 37.87 kg/m. If this is out of the aformentioned range listed, please consider follow up with your Primary Care Provider.    You have been scheduled for an endoscopy and colonoscopy. Please follow the written instructions given to you at your visit today. Please pick up your prep supplies at the pharmacy within the next 1-3 days. If you use inhalers (even only as needed), please bring them with you on the day of your procedure.   We have sent the following medications to your pharmacy for you to pick up at your convenience: [place] provider has requested that you go to the basement level for lab work before leaving today. Press "B" on the elevator. The lab is located at the first door on the left as you exit the elevator.  Due to recent changes in healthcare laws, you may see the results of your imaging and laboratory studies on MyChart before your provider has had a chance to review them.  We understand that in some cases there may be results that are confusing or concerning to you. Not all laboratory results come back in the same time frame and the provider may be waiting for multiple results in order to interpret others.  Please give [place] 48 hours in order for your provider to thoroughly review all the results before contacting the office for clarification of your results.   You have been referred to [facility]- Their office will contact you to scheduled an appointment. If you have not heard from them within 1 week, please contact their office.   We have also placed referral for TEE with Bubble.Office will contact you with an appointment. IF you have not heard from  office within 1 week, please call [phone] ask for Russell Quinney.   Thank you for choosing me and [facility].  [name]

## 2020-09-30 NOTE — Progress Notes (Signed)
[facility] VISIT   Primary Care Provider Pieter Partridge, [place] [address] [place] [address] [phone]  Referring Provider Dr. Lorenso Courier  Patient Profile: Sierra Logan is a 65 y.o. 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  The patient presents to the [facility] for an evaluation and management of problem(s) noted below:  Problem List 1. Iron deficiency anemia, unspecified iron deficiency anemia type   2. Primary biliary cholangitis ([place])   3. 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 Hypoxemia   5. Concern for possible hepatopulmonary syndrome (HCC)   6. Dark stools     History of Present Illness This is the patient's first visit the outpatient [facility] clinic.  Patient was previously followed by Dr. Richmond Campbell of [facility].  Reading through the chart, the patient has an established diagnosis of PBC based on liver biopsy.  She has evidence of portal hypertension manifested with esophageal varices in the setting of a endoscopy in April 2021.  She was initiated on ursodiol with slight improvement in her alkaline phosphatase and maintained on twice daily ursodiol.  Patient had been following with Dr. Earlean Shawl for abdominal pain that would come and go of unclear etiology.  The plan was to continue ursodiol and repeat evaluation in 1 year and to perform a FibroScan at 1 year to see how she would do.  Plan for colonoscopy follow-up in 2024 per report.  The patient was admitted in February 2 [facility] with a splenic infarct.  She followed up with Dr. Lorenso Courier in hematology for evaluation of the splenic infarct as well as iron deficiency anemia.  She required packed RBC transfusion during  her hospital stay as well as after.  Upon her evaluation the patient was still having abdominal pain in the left side as well as her chronic right-sided abdominal pain.  She is taking twice daily oral iron.  It was felt that the splenic infarct was of an unclear etiology.  Concern for anemia was felt to be GI bleeding in the patient wanted to establish with a new gastroenterologist.  The patient states that she has episodes of abdominal discomfort that occur in the mid epigastrium as well as right upper quadrant at times.  They come and go.  Etiology has been unremarkable for years.  In September and October of last year she developed episodes of black and tarry stools on a few occasions.  She was only recently initiated on iron after her hospital stay.  She denies any bright red blood per rectum.  She has a bowel movement twice daily.  Stool was formed.  The patient denies any issues with jaundice, scleral icterus, generalized pruritus, darkened/amber urine, clay-colored stools, LE edema, hematemesis, coffee-ground emesis, abdominal distention, confusion.  The biggest issue currently is her continued oxygen use for which she remains on 3 L of O2 and has not down titrated that and does not have follow-up with pulmonary either.  GI Review of Systems Positive as above including infrequent episodes of pyrosis Negative for dysphagia, odynophagia, bloating, hematochezia  Review of Systems General: Denies fevers/chills/weight loss unintentionally HEENT: Denies oral lesions Cardiovascular: Denies chest pain/palpitations Pulmonary: Positive for shortness of breath/cough though she feels stronger than when she was hospitalized but remains on oxygen and is not  sure when she will be able to come off of this Gastroenterological: See HPI Genitourinary: Denies darkened urine Hematological: Denies easy bruising/bleeding Endocrine: Denies temperature intolerance Dermatological: Denies jaundice Psychological:  Mood is stable   Medications Current Outpatient Medications  Medication Sig Dispense Refill   acetaminophen (TYLENOL) 500 MG tablet Take 500-1,000 mg by mouth every 8 (eight) hours as needed for mild pain or headache.     atorvastatin (LIPITOR) 10 MG tablet Take 10 mg by mouth at bedtime.     BIOTIN PO Take 1 tablet by mouth daily with breakfast.     Calcium Carb-Cholecalciferol (CALCIUM+D3 PO) Take 1 tablet by mouth daily after breakfast.     Cholecalciferol (VITAMIN D3 PO) Take 1 tablet by mouth daily.     ferrous sulfate 325 (65 FE) MG tablet Take 1 tablet (325 mg total) by mouth 2 (two) times daily with a meal. 60 tablet 3   furosemide (LASIX) 20 MG tablet Take 1 tablet (20 mg total) by mouth 2 (two) times daily. 60 tablet 3   gabapentin (NEURONTIN) 400 MG capsule Take 400-1,200 mg by mouth See admin instructions. Take 400 mg by mouth in the morning and 1,200 mg at bedtime     Multiple Vitamins-Calcium (ONE-A-DAY WOMENS FORMULA PO) Take 1 tablet by mouth daily with breakfast.     Na Sulfate-K Sulfate-Mg Sulf (SUPREP BOWEL PREP KIT) 17.5-3.13-1.6 GM/177ML SOLN Take 1 kit by mouth as directed. For colonoscopy prep 354 mL 0   Omega-3 Fatty Acids (FISH OIL PO) Take 1 capsule by mouth daily with breakfast.     ondansetron (ZOFRAN ODT) 4 MG disintegrating tablet Take 1 tablet (4 mg total) by mouth every 8 (eight) hours as needed for nausea or vomiting. 20 tablet 0   oxyCODONE (OXY IR/ROXICODONE) 5 MG immediate release tablet Take 1 tablet (5 mg total) by mouth every 4 (four) hours as needed for moderate pain or severe pain. 30 tablet 0   OXYGEN Inhale 3 L/min into the lungs continuous.     UNABLE TO FIND OUTPATIENT PHYSICAL STREET    Diagnosis: generalized debilty, arthritis 1 Mutually Defined 0   VITAMIN E PO Take 1 capsule by mouth daily with breakfast.     ursodiol (ACTIGALL) 500 MG tablet Take 500 mg by mouth in the morning and at bedtime. (Patient not taking: Reported on  09/30/2020)     No current facility-administered medications for this visit.    Allergies No Known Allergies  Histories Past Medical History:  Diagnosis Date   Cirrhosis (HCC)    Hyperlipemia    Hypertension    Neuropathy    Spleen disease    Past Surgical History:  Procedure Laterality Date   RECONSTRUCTION OF NOSE     TRACHEOSTOMY     TRACHEOSTOMY CLOSURE     Social History   Socioeconomic History   Marital status: Married    Spouse name: Not on file   Number of children: 0   Years of education: Not on file   Highest education level: Not on file  Occupational History   Occupation: part time courier  Tobacco Use   Smoking status: Former Smoker    Types: Cigarettes    Quit date: 09/02/2020    Years since quitting: 0.0   Smokeless tobacco: Never Used  Vaping Use   Vaping Use: Never used  Substance and Sexual Activity   Alcohol use: Not Currently   Drug use: Never   Sexual activity: Not on file  Other Topics  Concern   Not on file  Social History Narrative   Not on file   Social Determinants of Health   Financial Resource Strain: Not on file  Food Insecurity: Not on file  Transportation Needs: Not on file  Physical Activity: Not on file  Stress: Not on file  Social Connections: Not on file  Intimate Partner Violence: Not on file   Family History  Problem Relation Age of Onset   GER disease Mother    Heart disease Mother    Diabetes Father    Hypertension Sister    Hypertension Brother    Prostate cancer Maternal Grandfather    Hypertension Brother    Hypertension Brother    Hypertension Brother    Colon cancer Neg Hx    Esophageal cancer Neg Hx    Kidney disease Neg Hx    Liver disease Neg Hx    Pancreatic cancer Neg Hx    Rectal cancer Neg Hx    Stomach cancer Neg Hx    I have reviewed her medical, social, and family history in detail and updated the electronic medical record as necessary.    PHYSICAL  EXAMINATION  BP 120/64 (BP Location: Left Arm, Patient Position: Sitting, Cuff Size: Normal)    Pulse 100    Ht 5' 1.75" (1.568 m) Comment: height measured without shoes   Wt 205 lb 6 oz (93.2 kg)    BMI 37.87 kg/m  Wt Readings from Last 3 Encounters:  09/30/20 205 lb 6 oz (93.2 kg)  09/12/20 202 lb 4.8 oz (91.8 kg)  08/29/20 205 lb 0.4 oz (93 kg)  GEN: NAD, appears older than stated age and chronically ill, but is nontoxic  PSYCH: Cooperative, without pressured speech EYE: Conjunctivae pink, sclerae anicteric ENT: MMM, without oral ulcers CV: Nontachycardic RESP: No audible wheezing GI: NABS, soft, protuberant abdomen, mild tenderness to palpation in midepigastrium and left upper quadrant, hepatosplenomegaly is appreciated today, no rebound MSK/EXT: Trace bilateral lower extremity edema is present SKIN: Spider angiomata noted on upper thorax; no jaundice NEURO:  Alert & Oriented x 3, no focal deficits, no evidence of asterixis   REVIEW OF DATA  I reviewed the following data at the time of this encounter:  GI Procedures and Studies  Reported 2016 colonoscopy with tubular adenoma with plan for a 5-year interval in 2024 is not clear why that would be the case but is documented in the chart  April 2021 EGD Grade 1 esophageal varices, distal esophagitis, reactive gastropathy, peptic duodenitis Pathology A. DUODENUM, BIOPSY:       Duodenal mucosa with foveolar metaplasia, Brunner gland  hyperplasia and chronic inflammation     consistent with peptic  duodenitis.       Negative for malignancy.  B. STOMACH, ANTRUM, BIOPSY:       Reactive gastropathy.       Negative for Helicobacter Pylori on routine H&E stain.       Negative for intestinal metaplasia or malignancy.   February 2021 liver biopsy FINAL PATHOLOGIC DIAGNOSIS  MICROSCOPIC EXAMINATION AND DIAGNOSIS   LIVER, BIOPSY:       Focal portal chronic inflammation with interface activity,  active bile duct injury, and bile  ductular proliferation.       Mild steatohepatitis.       Focal portal and periportal fibrosis.       See comment and microscopic description.   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   It is noted that the patient also had elevated ANA (1:640,  05/08/2019) raising the possibility of a coexisting autoimmune  component although the smooth muscle antibody was negative  (05/23/2019) and ALT/AST were normal (11/42020).  Correlation with  serum IgG4 and quantitative serum test might be considered to  rule out a coexisting IgG4 disease or autoimmune hepatitis.   Laboratory Studies  Reviewed those in epic and care everywhere  August 2021 labs AST/ALT 29/19 Alk phos 151 Total bili 0.3 Albumin 3.7 Total protein 7.2 Sodium 126  2/21 labs INR 1.0  2/21 labs Hemoglobin/hematocrit 12.1/35.4 MCV 89.4 Platelets 192 WBC 9.21 May 2019 labs ANA 1-6 40 speckled pattern Hepatitis B surface antibody nonreactive Hepatitis B core antibody reactive Hepatitis B surface antigen nonreactive Hepatitis C antibody negative Hepatitis A IgG and IgM negative Copper 1.71 mcg/mL (1.45 upper limit normal) Ceruloplasmin 38.8 (upper limit of normal 51.0) AMA M2 greater than 3 Alpha-1 antitrypsin normal Anti-smooth muscle antibody negative Anti-LK M1 antibody negative  Imaging Studies  February 2022 CT abdomen pelvis with contrast IMPRESSION: 1. Advanced cirrhosis of the liver. Splenomegaly. 4-5 cm hypoperfused area in the anterior superior corner of the spleen presumably relating to a splenic infarction. This could explain the clinical presentation. 2. Aortic atherosclerosis. Maximal diameter of the infrarenal abdominal aorta measures 3.2 cm. Recommend follow-up ultrasound every 3 years. This  recommendation follows ACR consensus guidelines: White Paper of the ACR Incidental Findings Committee II on Vascular Findings. J Am Coll Radiol 2013; 10:789-794. 3. Nodal prominence in the pericardial station, the upper abdomen and the retroperitoneum, likely secondary to lymphatic congestion associated with cirrhosis.  Aortic Atherosclerosis (ICD10-I70.0).  February 2022 CT chest PE protocol IMPRESSION: 1. No acute intrathoracic pathology. No CT evidence of central pulmonary artery embolus. 2. Cirrhosis. 3. A 3.4 x 2.7 cm ill-defined low attenuating area in the anterior spleen may represent a mass or a focal area of infarct. 4. There is a 6 mm nodule in the superior segment of the right lower lobe. Non-contrast chest CT at 6-12 months is recommended. If the nodule is stable at time of repeat CT, then future CT at 18-24 months (from today's scan) is considered optional for low-risk patients, but is recommended for high-risk patients. This recommendation follows the consensus statement: Guidelines for Management of Incidental Pulmonary Nodules Detected on CT Images: From the Fleischner Society 2017; Radiology 2017; 284:228-243. 5. Aortic Atherosclerosis (ICD10-I70.0).   ASSESSMENT  Ms. Brownley is a 65 y.o. 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  The patient is seen today for evaluation and management of:  1. Iron deficiency anemia, unspecified iron deficiency anemia type   2. Primary biliary cholangitis ([place])   3. 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 Hypoxemia   5. Concern for possible hepatopulmonary syndrome (HCC)   6. Dark stools    The patient is hemodynamically stable at this time.  However, she does have evidence of advanced liver disease.  When  speaking with her, she states this is the first time that anyone has truly discussed and discussed it her underlying liver disease to this extent.  Patient feels like she has a better understanding but is also very concerned.  Patient has  significant issues in regards to anemia and iron deficiency.  With known prior esophageal varices and portal hypertension she is at risk for etiologies of iron deficiency anemia occurring.  She may be at risk of bleeding from EVs or from portal gastropathy or the development of GAVE or AVMs.  The potential for other underlying etiologies in the colon and small bowel must also be considered.  Diagnostic upper and lower endoscopy at minimum are to be considered.  We will need to do these procedures in the hospital-based setting because of the persistence of her hypoxemia.  During her clinic visit, we had her stop her oxygen for 10 minutes and she did have mild stability in the low 90-91 range but as soon as she got up to walk to the chair from the examination table her O2 sats dropped into the 80s.  No overt platypnea or orthopnea were noted.  However, the significant persistence of hypoxemia is concerning.  A TTE with bubble study to rule out hepatopulmonary syndrome should be considered and is ordered.  She will continue her ursodiol at this time but if her alkaline phosphatase remains elevated we may need to consider other therapies or potential referral to one of the quaternary centers.  It is not clear that she is a candidate for liver transplantation nor is she sure she would ever want that at this time but is we are barely establishing her currently that is something we will need to think about in the future.  We will get laboratories from an anemia perspective as well and see how her liver tests look currently.  The risks and benefits of endoscopic evaluation were discussed with the patient; these include but are not limited to the risk of perforation, infection, bleeding,  missed lesions, lack of diagnosis, severe illness requiring hospitalization, as well as anesthesia and sedation related illnesses.  The patient is agreeable to proceed.  All patient questions were answered to the best of my ability, and the patient agrees to the aforementioned plan of action with follow-up as indicated.   PLAN  Laboratories as outlined below TTE with bubble study to be obtained Pulmonary referral to be placed Diagnostic EGD/colonoscopy to be performed in hospital-based setting  #ESLD Management Volume -No diuretics currently -Obtain daily standing weight -1500-2000 mg Na diet Infection -No evidence of SBP currently Bleeding -We will plan to proceed with EGD for variceal evaluation/follow-up Encephalopathy -No evidence of at this time Screening -Up-to-date based on 2/22 CT abdomen/pelvis and will need repeat evaluation by August 2022 Transplant -Not clear that she needs or has a high enough meld or even wants at this time Vaccination -She will be recommended HAV and HBV vaccination/immunization in the near future after follow-up Other -Promote intake of 1.5 g/kg/day of Protein (Ensures/Boosts at each meal)    Orders Placed This Encounter  Procedures   Procedural/ Surgical Case Request: COLONOSCOPY WITH PROPOFOL, ESOPHAGOGASTRODUODENOSCOPY (EGD) WITH PROPOFOL   CBC   Comp Met (CMET)   TSH   IBC + Ferritin   Tissue transglutaminase, IgA   INR/PT   Mitochondrial antibodies   IgA   Ambulatory referral to Gastroenterology   Ambulatory referral to Pulmonology   ECHOCARDIOGRAM COMPLETE BUBBLE STUDY    New Prescriptions   NA SULFATE-K SULFATE-MG SULF (SUPREP BOWEL PREP KIT) 17.5-3.13-1.6 GM/177ML SOLN    Take 1 kit by mouth as directed. For colonoscopy prep   Modified Medications   No medications on file    Planned Follow Up No follow-ups  on file.   Total Time in Face-to-Face and in Coordination of Care for patient including  independent/personal interpretation/review of prior testing, medical history, examination, medication adjustment, communicating results with the patient directly, and documentation with the EHR is 50 minutes.   Justice Britain, MD [facility] Advanced Endoscopy Office # [phone]

## 2020-10-01 ENCOUNTER — Encounter: Payer: Self-pay | Admitting: Gastroenterology

## 2020-10-03 LAB — TISSUE TRANSGLUTAMINASE, IGA: (tTG) Ab, IgA: <1 U/mL

## 2020-10-03 LAB — IGA: Immunoglobulin A: 200 mg/dL (ref 70–320)

## 2020-10-03 LAB — MITOCHONDRIAL ANTIBODIES: Mitochondrial M2 Ab, IgG: 140.2 U — ABNORMAL HIGH

## 2020-10-07 DIAGNOSIS — R0902 Hypoxemia: Secondary | ICD-10-CM | POA: Insufficient documentation

## 2020-10-07 DIAGNOSIS — K7681 Hepatopulmonary syndrome: Secondary | ICD-10-CM | POA: Insufficient documentation

## 2020-10-07 DIAGNOSIS — R195 Other fecal abnormalities: Secondary | ICD-10-CM | POA: Insufficient documentation

## 2020-10-07 DIAGNOSIS — J9611 Chronic respiratory failure with hypoxia: Secondary | ICD-10-CM | POA: Insufficient documentation

## 2020-10-07 DIAGNOSIS — K743 Primary biliary cirrhosis: Secondary | ICD-10-CM | POA: Insufficient documentation

## 2020-10-08 ENCOUNTER — Telehealth: Payer: Self-pay

## 2020-10-08 NOTE — Telephone Encounter (Signed)
Dr Neita Garnet available appointment for TTE with bubble study is 11/11/20 with [facility]. Pt is scheduled for endoscopies at the hospital on 10/20/20. Please advise.

## 2020-10-09 ENCOUNTER — Telehealth: Payer: Self-pay | Admitting: *Deleted

## 2020-10-09 NOTE — Telephone Encounter (Signed)
TCT patient regarding lab appt for tomorrow. No answer but was able to leave message for patient that her lab appt tomorrow is cancelled. Dr. Lorenso Courier has recent lab results from her GI doctor.

## 2020-10-09 NOTE — Telephone Encounter (Signed)
-----   Message from Orson Slick, MD sent at 10/09/2020  8:14 AM EDT ----- Regarding: FW: no lab order Please call patient to cancel this lab visit. GI ordered all the labs I need about 1 week ago. No need for repeat. Hgb is improving nicely.   ----- Message ----- From: Docia Chuck Sent: 10/08/2020  12:18 PM EDT To: Orson Slick, MD, [facility] Subject: no lab order                                   This patient does not have lab orders in Epic for his/her next lab appointment. Please enter orders before 3/25 @1130

## 2020-10-09 NOTE — Telephone Encounter (Signed)
Since procedures are scheduled in the hospital-based setting, she will have full anesthesia services available to her.  Since her O2 is not at a severely elevated level, I am okay date and is available by cardiology.  Thanks. GM

## 2020-10-10 ENCOUNTER — Inpatient Hospital Stay: Payer: 59

## 2020-10-13 NOTE — Telephone Encounter (Signed)
Pt returned call. Pt has been informed of TTE w/ bubble study on 10/17/20 @ [place]. Pt will be contacted by hospital with further instructions. Pt voiced understanding.

## 2020-10-13 NOTE — Telephone Encounter (Addendum)
I was able to get pt scheduled prior to endoscopies. Pt scheduled for TEE w/ bubble study on 10/17/20 @ [place] 12:30pm. Called and left detailed message for pt.

## 2020-10-14 ENCOUNTER — Other Ambulatory Visit: Payer: 59

## 2020-10-15 ENCOUNTER — Other Ambulatory Visit: Payer: Self-pay

## 2020-10-15 ENCOUNTER — Telehealth: Payer: Self-pay | Admitting: Gastroenterology

## 2020-10-15 ENCOUNTER — Encounter (HOSPITAL_COMMUNITY): Payer: Self-pay | Admitting: Gastroenterology

## 2020-10-15 NOTE — Progress Notes (Signed)
Attempted to obtain medical history via telephone, unable to reach at this time. I left a voicemail to return pre surgical testing department's phone call.  

## 2020-10-16 ENCOUNTER — Other Ambulatory Visit (HOSPITAL_COMMUNITY)
Admission: RE | Admit: 2020-10-16 | Discharge: 2020-10-16 | Disposition: A | Discharge status: Home / Self Care | Payer: 59 | Source: Ambulatory Visit | Attending: Gastroenterology | Admitting: Gastroenterology

## 2020-10-16 DIAGNOSIS — Z01812 Encounter for preprocedural laboratory examination: Secondary | ICD-10-CM | POA: Insufficient documentation

## 2020-10-16 DIAGNOSIS — Z20822 Contact with and (suspected) exposure to covid-19: Secondary | ICD-10-CM | POA: Diagnosis not present

## 2020-10-16 LAB — SARS CORONAVIRUS 2 (TAT 6-24 HRS): SARS Coronavirus 2: NEGATIVE

## 2020-10-17 ENCOUNTER — Ambulatory Visit (HOSPITAL_BASED_OUTPATIENT_CLINIC_OR_DEPARTMENT_OTHER): Payer: 59

## 2020-10-17 ENCOUNTER — Other Ambulatory Visit (HOSPITAL_COMMUNITY): Payer: Self-pay | Admitting: Internal Medicine

## 2020-10-17 ENCOUNTER — Encounter (HOSPITAL_COMMUNITY): Payer: Self-pay | Admitting: Certified Registered Nurse Anesthetist

## 2020-10-17 ENCOUNTER — Ambulatory Visit (HOSPITAL_COMMUNITY)
Admission: RE | Admit: 2020-10-17 | Discharge: 2020-10-17 | Disposition: A | Discharge status: Home / Self Care | Payer: 59 | Attending: Internal Medicine | Admitting: Internal Medicine

## 2020-10-17 ENCOUNTER — Encounter (HOSPITAL_COMMUNITY)
Admission: RE | Disposition: A | Discharge status: Home / Self Care | Payer: Self-pay | Source: Home / Self Care | Attending: Internal Medicine

## 2020-10-17 DIAGNOSIS — R9431 Abnormal electrocardiogram [ECG] [EKG]: Secondary | ICD-10-CM

## 2020-10-17 DIAGNOSIS — K7681 Hepatopulmonary syndrome: Secondary | ICD-10-CM | POA: Insufficient documentation

## 2020-10-17 DIAGNOSIS — R0602 Shortness of breath: Secondary | ICD-10-CM | POA: Diagnosis not present

## 2020-10-17 HISTORY — PX: INTRAOPERATIVE TRANSTHORACIC ECHOCARDIOGRAM: SHX6523

## 2020-10-17 SURGERY — ECHOCARDIOGRAM, TRANSTHORACIC

## 2020-10-17 NOTE — Progress Notes (Signed)
Pt scheduled incorrectly for TEE when pt supposed to be scheduled for outpatient TTE.  2D Echo performed by Rich L at bedside in Endoscopy.  Patient discharged home with family.  Vista Lawman, RN

## 2020-10-19 ENCOUNTER — Encounter (HOSPITAL_COMMUNITY): Payer: Self-pay | Admitting: Internal Medicine

## 2020-10-19 NOTE — Anesthesia Preprocedure Evaluation (Addendum)
Anesthesia Evaluation  Patient identified by MRN, date of birth, ID band Patient awake    Reviewed: Allergy & Precautions, NPO status , Patient's Chart, lab work & pertinent test results  Airway Mallampati: III  TM Distance: >3 FB Neck ROM: Full    Dental no notable dental hx. (+) Teeth Intact, Dental Advisory Given   Pulmonary shortness of breath, Patient abstained from smoking., former smoker,  ? hepatopulm syndrome  On 2L [place]   Pulmonary exam normal breath sounds clear to auscultation       Cardiovascular hypertension, Pt. on medications Normal cardiovascular exam Rhythm:Regular Rate:Normal     Neuro/Psych    GI/Hepatic (+) Cirrhosis       , Primary billiary cirrhosis   Endo/Other    Renal/GU      Musculoskeletal negative musculoskeletal ROS (+)   Abdominal (+) + obese,   Peds  Hematology  (+) anemia , Lab Results      Component                Value               Date                      WBC                      8.3                 09/30/2020                HGB                      11.5 (L)            09/30/2020                HCT                      35.5 (L)            09/30/2020                MCV                      81.4                09/30/2020                PLT                      137.0 (L)           09/30/2020           Hx of fe deficiecy anenia   Anesthesia Other Findings   Reproductive/Obstetrics                            Anesthesia Physical Anesthesia Plan  ASA: IV  Anesthesia Plan: MAC   Post-op Pain Management:    Induction: Intravenous  PONV Risk Score and Plan: Treatment may vary due to age or medical condition  Airway Management Planned: Nasal Cannula and Natural Airway  Additional Equipment: None  Intra-op Plan:   Post-operative Plan:   Informed Consent: I have reviewed the patients History and Physical, chart, labs and discussed the procedure  including the risks, benefits and alternatives for the proposed anesthesia with the  patient or authorized representative who has indicated his/her understanding and acceptance.     Dental advisory given  Plan Discussed with: CRNA  Anesthesia Plan Comments: (For colonoscopy)       Anesthesia Quick Evaluation

## 2020-10-20 ENCOUNTER — Ambulatory Visit (HOSPITAL_COMMUNITY): Payer: 59 | Admitting: Certified Registered"

## 2020-10-20 ENCOUNTER — Encounter (HOSPITAL_COMMUNITY)
Admission: RE | Disposition: A | Discharge status: Home / Self Care | Payer: Self-pay | Source: Home / Self Care | Attending: Gastroenterology

## 2020-10-20 ENCOUNTER — Ambulatory Visit (HOSPITAL_COMMUNITY)
Admission: RE | Admit: 2020-10-20 | Discharge: 2020-10-20 | Disposition: A | Discharge status: Home / Self Care | Payer: 59 | Attending: Gastroenterology | Admitting: Gastroenterology

## 2020-10-20 ENCOUNTER — Other Ambulatory Visit: Payer: Self-pay

## 2020-10-20 ENCOUNTER — Encounter (HOSPITAL_COMMUNITY): Payer: Self-pay | Admitting: Gastroenterology

## 2020-10-20 DIAGNOSIS — D123 Benign neoplasm of transverse colon: Secondary | ICD-10-CM | POA: Insufficient documentation

## 2020-10-20 DIAGNOSIS — I8510 Secondary esophageal varices without bleeding: Secondary | ICD-10-CM

## 2020-10-20 DIAGNOSIS — Z9981 Dependence on supplemental oxygen: Secondary | ICD-10-CM | POA: Diagnosis not present

## 2020-10-20 DIAGNOSIS — L539 Erythematous condition, unspecified: Secondary | ICD-10-CM | POA: Diagnosis not present

## 2020-10-20 DIAGNOSIS — I700 Atherosclerosis of aorta: Secondary | ICD-10-CM | POA: Insufficient documentation

## 2020-10-20 DIAGNOSIS — I8500 Esophageal varices without bleeding: Secondary | ICD-10-CM | POA: Diagnosis not present

## 2020-10-20 DIAGNOSIS — I10 Essential (primary) hypertension: Secondary | ICD-10-CM | POA: Diagnosis not present

## 2020-10-20 DIAGNOSIS — K5730 Diverticulosis of large intestine without perforation or abscess without bleeding: Secondary | ICD-10-CM | POA: Diagnosis not present

## 2020-10-20 DIAGNOSIS — Z79899 Other long term (current) drug therapy: Secondary | ICD-10-CM | POA: Insufficient documentation

## 2020-10-20 DIAGNOSIS — K31811 Angiodysplasia of stomach and duodenum with bleeding: Secondary | ICD-10-CM | POA: Insufficient documentation

## 2020-10-20 DIAGNOSIS — D509 Iron deficiency anemia, unspecified: Secondary | ICD-10-CM

## 2020-10-20 DIAGNOSIS — Z87891 Personal history of nicotine dependence: Secondary | ICD-10-CM | POA: Diagnosis not present

## 2020-10-20 DIAGNOSIS — K641 Second degree hemorrhoids: Secondary | ICD-10-CM | POA: Diagnosis not present

## 2020-10-20 DIAGNOSIS — K31A19 Gastric intestinal metaplasia without dysplasia, unspecified site: Secondary | ICD-10-CM | POA: Diagnosis not present

## 2020-10-20 DIAGNOSIS — K743 Primary biliary cirrhosis: Secondary | ICD-10-CM | POA: Diagnosis not present

## 2020-10-20 DIAGNOSIS — K644 Residual hemorrhoidal skin tags: Secondary | ICD-10-CM | POA: Diagnosis not present

## 2020-10-20 DIAGNOSIS — E785 Hyperlipidemia, unspecified: Secondary | ICD-10-CM | POA: Diagnosis not present

## 2020-10-20 DIAGNOSIS — K7581 Nonalcoholic steatohepatitis (NASH): Secondary | ICD-10-CM | POA: Insufficient documentation

## 2020-10-20 DIAGNOSIS — R0902 Hypoxemia: Secondary | ICD-10-CM

## 2020-10-20 DIAGNOSIS — K635 Polyp of colon: Secondary | ICD-10-CM

## 2020-10-20 DIAGNOSIS — R109 Unspecified abdominal pain: Secondary | ICD-10-CM | POA: Diagnosis present

## 2020-10-20 DIAGNOSIS — K3189 Other diseases of stomach and duodenum: Secondary | ICD-10-CM | POA: Insufficient documentation

## 2020-10-20 DIAGNOSIS — Z8719 Personal history of other diseases of the digestive system: Secondary | ICD-10-CM | POA: Diagnosis not present

## 2020-10-20 DIAGNOSIS — K2980 Duodenitis without bleeding: Secondary | ICD-10-CM | POA: Diagnosis not present

## 2020-10-20 DIAGNOSIS — K766 Portal hypertension: Secondary | ICD-10-CM | POA: Diagnosis not present

## 2020-10-20 HISTORY — PX: ESOPHAGOGASTRODUODENOSCOPY (EGD) WITH PROPOFOL: SHX5813

## 2020-10-20 HISTORY — PX: RADIOFREQUENCY ABLATION: SHX2290

## 2020-10-20 HISTORY — PX: COLONOSCOPY WITH PROPOFOL: SHX5780

## 2020-10-20 HISTORY — PX: POLYPECTOMY: SHX5525

## 2020-10-20 HISTORY — PX: BIOPSY: SHX5522

## 2020-10-20 SURGERY — COLONOSCOPY WITH PROPOFOL
Anesthesia: Monitor Anesthesia Care

## 2020-10-20 MED ORDER — PROPOFOL 500 MG/50ML IV EMUL
INTRAVENOUS | Status: DC | PRN
  Administered 2020-10-20: 125 ug/kg/min via INTRAVENOUS

## 2020-10-20 MED ORDER — LIDOCAINE 2% (20 MG/ML) 5 ML SYRINGE
INTRAMUSCULAR | Status: DC | PRN
  Administered 2020-10-20: 60 mg via INTRAVENOUS

## 2020-10-20 MED ORDER — LACTATED RINGERS IV SOLN
INTRAVENOUS | Status: AC | PRN
  Administered 2020-10-20: 1000 mL via INTRAVENOUS

## 2020-10-20 MED ORDER — SODIUM CHLORIDE 0.9 % IV SOLN
INTRAVENOUS | Status: DC
Start: 2020-10-20 — End: 2020-10-20

## 2020-10-20 MED ORDER — PROPOFOL 1000 MG/100ML IV EMUL
INTRAVENOUS | Status: AC
  Filled 2020-10-20: qty 100

## 2020-10-20 MED ORDER — OMEPRAZOLE 40 MG PO CPDR: 40.0000 mg | DELAYED_RELEASE_CAPSULE | Freq: Two times a day (BID) | ORAL | 6 refills | Status: DC

## 2020-10-20 MED ORDER — URSODIOL 300 MG PO CAPS: 600.0000 mg | ORAL_CAPSULE | Freq: Two times a day (BID) | ORAL | 6 refills | Status: AC

## 2020-10-20 MED ORDER — SUCRALFATE 1 GM/10ML PO SUSP: 1.0000 g | Freq: Three times a day (TID) | ORAL | 2 refills | Status: DC

## 2020-10-20 MED ORDER — PROPOFOL 10 MG/ML IV BOLUS
INTRAVENOUS | Status: DC | PRN
  Administered 2020-10-20: 10 mg via INTRAVENOUS
  Administered 2020-10-20: 20 mg via INTRAVENOUS

## 2020-10-20 SURGICAL SUPPLY — 25 items

## 2020-10-20 NOTE — Op Note (Addendum)
[facility] Patient Name: Sierra Logan Procedure Date: 10/20/2020 MRN: [id number] Attending MD: Justice Britain , MD Date of Birth: Oct 31, 1955 CSN: [id number] Age: 65 Admit Type: Outpatient Procedure:                Colonoscopy Indications:              Abdominal pain, Iron deficiency anemia Providers:                Justice Britain, MD, Kary Kos RN, RN, Cletis Athens, Technician, Jefm Miles CRNA Referring MD:             Real Cons. Sharlet Salina MD, MD, Ledell Peoples Iv Medicines:                Monitored Anesthesia Care Complications:            No immediate complications. Estimated Blood Loss:     Estimated blood loss was minimal. Procedure:                Pre-Anesthesia Assessment:                           - Prior to the procedure, a History and Physical                            was performed, and patient medications and                            allergies were reviewed. The patient's tolerance of                            previous anesthesia was also reviewed. The risks                            and benefits of the procedure and the sedation                            options and risks were discussed with the patient.                            All questions were answered, and informed consent                            was obtained. Prior Anticoagulants: The patient has                            taken no previous anticoagulant or antiplatelet                            agents. ASA Grade Assessment: III - A patient with                            severe systemic disease. After reviewing the risks  and benefits, the patient was deemed in                            satisfactory condition to undergo the procedure.                           After obtaining informed consent, the colonoscope                            was passed under direct vision. Throughout the                            procedure,  the patient's blood pressure, pulse, and                            oxygen saturations were monitored continuously. The                            [id number]-H190DL ([id number] Olympus pediatric colonscope                            was introduced through the anus and advanced to the                            the cecum, identified by appendiceal orifice and                            ileocecal valve. The colonoscopy was performed                            without difficulty. The patient tolerated the                            procedure. The quality of the bowel preparation was                            fair. The ileocecal valve, appendiceal orifice, and                            rectum were photographed. Scope In: 1:35:38 PM Scope Out: 1:52:37 PM Scope Withdrawal Time: 0 hours 13 minutes 13 seconds  Total Procedure Duration: 0 hours 16 minutes 59 seconds  Findings:      The perianal exam findings include perianal erythema.      The digital rectal exam findings include hemorrhoids. Pertinent       negatives include no palpable rectal lesions.      A 6 mm polyp was found in the transverse colon. The polyp was sessile.       The polyp was removed with a cold snare. Resection and retrieval were       complete.      Extensive amounts of liquid and semi-liquid stool was found in the       entire colon, interfering with visualization. Lavage of the area was       performed using copious amounts, resulting in clearance with fair  visualization.      Multiple small-mouthed diverticula were found in the recto-sigmoid       colon, sigmoid colon and descending colon.      Non-bleeding non-thrombosed external and internal hemorrhoids were found       during retroflexion, during perianal exam and during digital exam. The       hemorrhoids were Grade II (internal hemorrhoids that prolapse but reduce       spontaneously). Impression:               - Preparation of the colon was fair even after                             copious lavage.                           - Perianal erythema found on perianal exam.                            Hemorrhoids found on digital rectal exam.                           - One 6 mm polyp in the transverse colon, removed                            with a cold snare. Resected and retrieved.                           - Stool in the entire examined colon.                           - Diverticulosis in the recto-sigmoid colon, in the                            sigmoid colon and in the descending colon.                           - Non-bleeding non-thrombosed external and internal                            hemorrhoids. Moderate Sedation:      Not Applicable - Patient had care per Anesthesia. Recommendation:           - The patient will be observed post-procedure,                            until all discharge criteria are met.                           - Discharge patient to home.                           - Patient has a contact number available for                            emergencies. The signs and symptoms of potential  delayed complications were discussed with the                            patient. Return to normal activities tomorrow.                            Written discharge instructions were provided to the                            patient.                           - High fiber diet.                           - Continue present medications.                           - Await pathology results.                           - Repeat colonoscopy within 1 year because the                            bowel preparation was poor (will need a 2-day                            preparation but could consider with upcoming follow                            up EGD if she desires).                           - The findings and recommendations were discussed                            with the patient.                           - The  findings and recommendations were discussed                            with the patient's family. Procedure Code(s):        --- Professional ---                           [phone], Colonoscopy, flexible; with removal of                            tumor(s), polyp(s), or other lesion(s) by snare                            technique Diagnosis Code(s):        --- Professional ---                           K64.1, Second  degree hemorrhoids                           R21, Rash and other nonspecific skin eruption                           K63.5, Polyp of colon                           R10.9, Unspecified abdominal pain                           D50.9, Iron deficiency anemia, unspecified                           K57.30, Diverticulosis of large intestine without                            perforation or abscess without bleeding CPT copyright 2019 American [employer] Association. All rights reserved. The codes documented in this report are preliminary and upon coder review may  be revised to meet current compliance requirements. Justice Britain, MD 10/20/2020 2:19:59 PM Number of Addenda: 0

## 2020-10-20 NOTE — Transfer of Care (Signed)
Immediate Anesthesia Transfer of Care Note  Patient: Sierra Logan  Procedure(s) Performed: COLONOSCOPY WITH PROPOFOL (N/A ) ESOPHAGOGASTRODUODENOSCOPY (EGD) WITH PROPOFOL (N/A ) BIOPSY RADIO FREQUENCY ABLATION POLYPECTOMY  Patient Location: PACU and Endoscopy Unit  Anesthesia Type:MAC  Level of Consciousness: awake, alert  and patient cooperative  Airway & Oxygen Therapy: Patient Spontanous Breathing and Patient connected to face mask oxygen  Post-op Assessment: Report given to RN and Post -op Vital signs reviewed and stable  Post vital signs: Reviewed and stable  Last Vitals:  Vitals Value Taken Time  BP 110/51 10/20/20 1401  Temp    Pulse 94 10/20/20 1404  Resp 16 10/20/20 1405  SpO2 98 % 10/20/20 1404  Vitals shown include unvalidated device data.  Last Pain:  Vitals:   10/20/20 1146  TempSrc: Oral  PainSc: 0-No pain         Complications: No complications documented.

## 2020-10-20 NOTE — Anesthesia Postprocedure Evaluation (Signed)
Anesthesia Post Note  Patient: Sierra Logan  Procedure(s) Performed: COLONOSCOPY WITH PROPOFOL (N/A ) ESOPHAGOGASTRODUODENOSCOPY (EGD) WITH PROPOFOL (N/A ) BIOPSY RADIO FREQUENCY ABLATION POLYPECTOMY     Patient location during evaluation: Endoscopy Anesthesia Type: MAC Level of consciousness: awake and alert Pain management: pain level controlled Vital Signs Assessment: post-procedure vital signs reviewed and stable Respiratory status: spontaneous breathing, nonlabored ventilation, respiratory function stable and patient connected to nasal cannula oxygen Cardiovascular status: blood pressure returned to baseline and stable Postop Assessment: no apparent nausea or vomiting Anesthetic complications: no   No complications documented.  Last Vitals:  Vitals:   10/20/20 1417 10/20/20 1421  BP: (!) 113/51 (!) 112/51  Pulse: 94 87  Resp: 16 15  Temp:    SpO2: (!) 87% 92%    Last Pain:  Vitals:   10/20/20 1402  TempSrc: Oral  PainSc: 0-No pain                 Barnet Glasgow

## 2020-10-20 NOTE — Interval H&P Note (Signed)
History and Physical Interval Note:  10/20/2020 11:53 AM  Sierra Logan  has presented today for surgery, with the diagnosis of IDA, Cirrhosis, Hypoxemic Resp Failure.  The various methods of treatment have been discussed with the patient and family. After consideration of risks, benefits and other options for treatment, the patient has consented to  Procedure(s): COLONOSCOPY WITH PROPOFOL (N/A) ESOPHAGOGASTRODUODENOSCOPY (EGD) WITH PROPOFOL (N/A) as a surgical intervention.  The patient's history has been reviewed, patient examined, no change in status, stable for surgery.  I have reviewed the patient's chart and labs.  Questions were answered to the patient's satisfaction.     [employer]

## 2020-10-20 NOTE — Discharge Instructions (Signed)
YOU HAD AN ENDOSCOPIC PROCEDURE TODAY: Refer to the procedure report and other information in the discharge instructions given to you for any specific questions about what was found during the examination. If this information does not answer your questions, please call [place] office at [phone] to clarify.  ° °YOU SHOULD EXPECT: Some feelings of bloating in the abdomen. Passage of more gas than usual. Walking can help get rid of the air that was put into your GI tract during the procedure and reduce the bloating. If you had a lower endoscopy (such as a colonoscopy or flexible sigmoidoscopy) you may notice spotting of blood in your stool or on the toilet paper. Some abdominal soreness may be present for a day or two, also. ° °DIET: Your first meal following the procedure should be a light meal and then it is ok to progress to your normal diet. A half-sandwich or bowl of soup is an example of a good first meal. Heavy or fried foods are harder to digest and may make you feel nauseous or bloated. Drink plenty of fluids but you should avoid alcoholic beverages for 24 hours. If you had a esophageal dilation, please see attached instructions for diet.   ° °ACTIVITY: Your care partner should take you home directly after the procedure. You should plan to take it easy, moving slowly for the rest of the day. You can resume normal activity the day after the procedure however YOU SHOULD NOT DRIVE, use power tools, machinery or perform tasks that involve climbing or major physical exertion for 24 hours (because of the sedation medicines used during the test).  ° °SYMPTOMS TO REPORT IMMEDIATELY: °A gastroenterologist can be reached at any hour. Please call [phone]  for any of the following symptoms:  °Following lower endoscopy (colonoscopy, flexible sigmoidoscopy) °Excessive amounts of blood in the stool  °Significant tenderness, worsening of abdominal pains  °Swelling of the abdomen that is new, acute  °Fever of 100° or  higher  °Following upper endoscopy (EGD, EUS, ERCP, esophageal dilation) °Vomiting of blood or coffee ground material  °New, significant abdominal pain  °New, significant chest pain or pain under the shoulder blades  °Painful or persistently difficult swallowing  °New shortness of breath  °Black, tarry-looking or red, bloody stools ° °FOLLOW UP:  °If any biopsies were taken you will be contacted by phone or by letter within the next 1-3 weeks. Call [phone]  if you have not heard about the biopsies in 3 weeks.  °Please also call with any specific questions about appointments or follow up tests. ° °

## 2020-10-20 NOTE — Anesthesia Procedure Notes (Signed)
Procedure Name: MAC Date/Time: 10/20/2020 12:47 PM Performed by: Eben Burow, CRNA Pre-anesthesia Checklist: Patient identified, Emergency Drugs available, Suction available, Patient being monitored and Timeout performed Oxygen Delivery Method: Simple face mask Placement Confirmation: positive ETCO2

## 2020-10-20 NOTE — Op Note (Signed)
[facility] Patient Name: Sierra Logan Procedure Date: 10/20/2020 MRN: [id number] Attending MD: Justice Britain , MD Date of Birth: May 09, 1956 CSN: [id number] Age: 65 Admit Type: Outpatient Procedure:                Upper GI endoscopy Indications:              Diagnostic procedure, Iron deficiency anemia Providers:                Justice Britain, MD, Kary Kos RN, RN, Cletis Athens, Technician, Jefm Miles CRNA Referring MD:             Sherlon Handing Crawford MD, MD Medicines:                Monitored Anesthesia Care Complications:            No immediate complications. Estimated Blood Loss:     Estimated blood loss was minimal. Procedure:                Pre-Anesthesia Assessment:                           - Prior to the procedure, a History and Physical                            was performed, and patient medications and                            allergies were reviewed. The patient's tolerance of                            previous anesthesia was also reviewed. The risks                            and benefits of the procedure and the sedation                            options and risks were discussed with the patient.                            All questions were answered, and informed consent                            was obtained. Prior Anticoagulants: The patient has                            taken no previous anticoagulant or antiplatelet                            agents except for aspirin. ASA Grade Assessment:                            III - A patient with severe systemic disease. After  reviewing the risks and benefits, the patient was                            deemed in satisfactory condition to undergo the                            procedure.                           After obtaining informed consent, the endoscope was                            passed under direct  vision. Throughout the                            procedure, the patient's blood pressure, pulse, and                            oxygen saturations were monitored continuously. The                            GIF-H190 ([id number]) was introduced through the                            mouth, and advanced to the second part of duodenum.                            The upper GI endoscopy was accomplished without                            difficulty. The patient tolerated the procedure. Scope In: Scope Out: Findings:      No gross lesions were noted in the proximal esophagus and in the mid       esophagus.      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      The Z-line was regular and was found 40 cm from the incisors.      Diffuse moderately erythematous mucosa without bleeding was found in the       cardia.      Mild portal hypertensive gastropathy was found in the entire examined       stomach.      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 After the       rest of the EGD was complete, focal radiofrequency ablation of gastric       antral vascular ectasia in the gastric antrum/prepyloric region of the       stomach was performed. With the endoscope in place, the position and       extent of the abnormal mucosa and appropriate anatomic landmarks were       noted. The abnormal mucosa was irrigated with water. Gastric contents       were suctioned. The endoscope was then removed from the patient. The       Barrx-90 radiofrequency ablation catheter was attached to the tip of the  endoscope. The endoscope with the attached radiofrequency ablation       catheter was then passed under direct vision and advanced to the areas       of abnormal mucosa. The radiofrequency ablation catheter was placed in       contact with the surface of the abnormal mucosa under direct       visualization and  energy was applied twice at 12 J/cm2. Ablation was       repeated in a likewise fashion to all visible abnormal mucosa. The areas       where abnormal mucosa had been ablated were examined. Areas of abnormal       mucosa appeared completely ablated. Whitish changes of ablated mucosa       were present. The total number of energy applications for all mucosal       sites treated was 44.      Biopsies were taken with a cold forceps in the cardia, on the greater       curvature of the gastric body, on the lesser curvature of the gastric       body, at the incisura and in the gastric antrum for histology and       Helicobacter pylori testing.      Nodular mucosa was found in the duodenal bulb, in the first portion of       the duodenum and in the second portion of the duodenum - not clearly       adenomatous (query peptic duodenitis). Biopsies were taken with a cold       forceps for histology. Impression:               - No gross lesions in esophagus proximally. Grade                            II esophageal varices distally.                           - Z-line regular, 40 cm from the incisors.                           - Erythematous mucosa in the cardia. Portal                            hypertensive gastropathy throughout.                           - Gastric antral vascular ectasia with bleeding.                            Treated with radiofrequency ablation.                           - Biopsies were taken with a cold forceps for                            histology and Helicobacter pylori testing.                           - Nodular mucosa in the duodenal bulb and in the  second portion of the duodenum. Biopsied. Moderate Sedation:      Not Applicable - Patient had care per Anesthesia. Recommendation:           - Proceed to scheduled colonoscopy.                           - Initiate Omeprazole 40 mg twice daily                            (indefinitely  currently).                           - Start Carafate 1 g with each meal and at bedtime                            (take no other medications within 1 hour of taking                            Carafate) - should continue to take this for at                            least 1 month.                           - Await pathology results.                           - Repeat upper endoscopy in 6-8 weeks for                            retreatment of any remaining GAVE.                           - If GAVE persists after 3-4 treatments, then will                            need to consider TIPS.                           - If BP allows, then would like to initiate                            Carvedilol or Nadolol or Propanolol. We will have                            patient take BPs over the next 2-weeks and if she                            has sustained HRs>60 and BPs>100 then would                            initiate low-dose beta-blockade in effort of  decreasing risk of bleeding in future from EVs and                            PHG and GAVE.                           - The findings and recommendations were discussed                            with the patient.                           - The findings and recommendations were discussed                            with the patient's family. Procedure Code(s):        --- Professional ---                           [id number], Esophagogastroduodenoscopy, flexible,                            transoral; with biopsy, single or multiple Diagnosis Code(s):        --- Professional ---                           I85.00, Esophageal varices without bleeding                           K31.89, Other diseases of stomach and duodenum                           K76.6, Portal hypertension                           K31.811, Angiodysplasia of stomach and duodenum                            with bleeding                           D50.9, Iron  deficiency anemia, unspecified CPT copyright 2019 American Medical Association. All rights reserved. The codes documented in this report are preliminary and upon coder review may  be revised to meet current compliance requirements. Justice Britain, MD 10/20/2020 2:12:43 PM Number of Addenda: 0

## 2020-10-21 LAB — SURGICAL PATHOLOGY

## 2020-10-22 ENCOUNTER — Encounter (HOSPITAL_COMMUNITY): Payer: Self-pay | Admitting: Gastroenterology

## 2020-10-24 ENCOUNTER — Encounter: Payer: Self-pay | Admitting: Gastroenterology

## 2020-10-27 ENCOUNTER — Telehealth: Payer: Self-pay | Admitting: Gastroenterology

## 2020-10-27 MED ORDER — SUCRALFATE 1 GM/10ML PO SUSP: 1.0000 g | Freq: Three times a day (TID) | ORAL | 2 refills | Status: DC

## 2020-10-27 NOTE — Telephone Encounter (Signed)
Prescription resent pt aware

## 2020-10-27 NOTE — Telephone Encounter (Signed)
Patient called to advise that [facility] does not have her Carafate medication please resend.

## 2020-10-30 ENCOUNTER — Telehealth: Payer: Self-pay | Admitting: Gastroenterology

## 2020-11-03 MED ORDER — SUCRALFATE 1 G PO TABS: 1.0000 g | ORAL_TABLET | Freq: Three times a day (TID) | ORAL | 0 refills | Status: DC

## 2020-11-03 NOTE — Telephone Encounter (Signed)
I have sent the tablet form and advised the pt to make a "slurry".  She will call if insurance is still not covering it.

## 2020-11-05 ENCOUNTER — Telehealth: Payer: Self-pay | Admitting: Gastroenterology

## 2020-11-05 NOTE — Telephone Encounter (Signed)
Inbound call from patient stating her legs and ankles are swollen/red and believe it could be digestion problem. Patient is requesting a call back at [phone].

## 2020-11-05 NOTE — Telephone Encounter (Signed)
The pt states she has some edema of the legs and ankles.  She states that they are red/warm. She has been advised to call her PCP and make them aware and get evaluated. If she is not able I advised her to go to urgent care.  The pt has been advised of the information and verbalized understanding.

## 2020-11-19 ENCOUNTER — Other Ambulatory Visit: Payer: Self-pay

## 2020-11-19 ENCOUNTER — Encounter: Payer: Self-pay | Admitting: Internal Medicine

## 2020-11-19 ENCOUNTER — Ambulatory Visit: Payer: 59 | Admitting: Internal Medicine

## 2020-11-19 DIAGNOSIS — R911 Solitary pulmonary nodule: Secondary | ICD-10-CM | POA: Diagnosis not present

## 2020-11-19 DIAGNOSIS — K7681 Hepatopulmonary syndrome: Secondary | ICD-10-CM

## 2020-11-19 DIAGNOSIS — K743 Primary biliary cirrhosis: Secondary | ICD-10-CM | POA: Diagnosis not present

## 2020-11-19 DIAGNOSIS — J9611 Chronic respiratory failure with hypoxia: Secondary | ICD-10-CM

## 2020-11-19 DIAGNOSIS — I700 Atherosclerosis of aorta: Secondary | ICD-10-CM

## 2020-11-19 MED ORDER — FUROSEMIDE 40 MG PO TABS: 40.0000 mg | ORAL_TABLET | Freq: Two times a day (BID) | ORAL | 3 refills | Status: DC

## 2020-11-19 NOTE — Progress Notes (Signed)
Subjective:   Patient ID: Sierra Logan, female    DOB: [birth year], 65 y.o.   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   PMH, [facility], social history reviewed and updated  Review of Systems  Constitutional: Positive for activity change, appetite change and fatigue.  HENT: Negative.   Eyes: Negative.   Respiratory: Positive for shortness of breath. Negative for cough, chest tightness and wheezing.   Cardiovascular: Positive for leg swelling. Negative for chest pain and palpitations.  Gastrointestinal: Positive for abdominal distention and abdominal pain. Negative for anal bleeding, blood in stool, constipation, diarrhea, nausea, rectal pain and vomiting.  Musculoskeletal: Negative.   Skin: Negative.   Neurological: Negative.   Psychiatric/Behavioral: Negative.      Objective:  Physical Exam Constitutional:      Appearance: She is well-developed. She is obese.  HENT:     Head: Normocephalic and atraumatic.  Cardiovascular:     Rate and Rhythm: Normal rate and regular rhythm.  Pulmonary:     Effort: Pulmonary effort is normal. No respiratory distress.     Breath sounds: Rhonchi present. No wheezing or rales.     Comments: Nasal canula on but she has turned off oxygen during our visit, O2 normal with sitting without oxygen, smelling of tobacco smoke Abdominal:     General: Bowel sounds are normal. There is distension.     Palpations: Abdomen is soft.     Tenderness: There is abdominal tenderness. There is no rebound.     Comments: Pain left upper abdomen, without rebound, distended, hepatosplenomegaly noted  Musculoskeletal:     Cervical back: Normal range of motion.     Right lower leg: Edema present.     Left lower leg: Edema present.     Comments: 2+ edema pitting to the knees, no weeping or ulcers  Skin:    General: Skin is warm and dry.  Neurological:     Mental Status: She is alert and oriented to person, place, and time.     Coordination: Coordination normal.     Vitals:   11/19/20 1043  BP: 132/76  Pulse: (!) 57  Temp: 97.9 F (36.6 C)  TempSrc: Oral  SpO2: 99%  Weight: 202 lb 9.6 oz (91.9 kg)  Height: 5\' 2"  (1.575 m)  This visit occurred during the SARS-CoV-2 public health emergency.  Safety protocols were in place, including screening questions prior to the visit, additional usage of staff PPE, and extensive cleaning of exam room while observing appropriate contact time as indicated for disinfecting solutions.   Assessment & Plan:

## 2020-11-19 NOTE — Patient Instructions (Addendum)
We will have you take 2 lasix in the morning and 1 in the evening. We have sent in a new prescription for 40 mg lasix so when you get the new one take 1 pill twice a day.   Call us in 2-3 days and let us know how this is working for the swelling.  I will check on the ultrasound of the heart to see why this has not been read.

## 2020-11-20 ENCOUNTER — Ambulatory Visit (INDEPENDENT_AMBULATORY_CARE_PROVIDER_SITE_OTHER): Payer: 59 | Admitting: Pulmonary Disease

## 2020-11-20 ENCOUNTER — Encounter: Payer: Self-pay | Admitting: Pulmonary Disease

## 2020-11-20 DIAGNOSIS — R911 Solitary pulmonary nodule: Secondary | ICD-10-CM | POA: Diagnosis not present

## 2020-11-20 DIAGNOSIS — Z720 Tobacco use: Secondary | ICD-10-CM | POA: Diagnosis not present

## 2020-11-20 DIAGNOSIS — I700 Atherosclerosis of aorta: Secondary | ICD-10-CM | POA: Insufficient documentation

## 2020-11-20 DIAGNOSIS — J9611 Chronic respiratory failure with hypoxia: Secondary | ICD-10-CM

## 2020-11-20 NOTE — Assessment & Plan Note (Signed)
We did discuss this finding and that recent imaging did not evaluate severity of plaque/blockages. We talked about how smoking cessation can help lower risk for heart attack and stroke. She is taking lipitor which we will continue.

## 2020-11-20 NOTE — Assessment & Plan Note (Signed)
Seeing GI and taking ursidiol. Explained to patient the rationale behind this as she did seem a little confused about the significance of a lot of her recent diagnoses.

## 2020-11-20 NOTE — Progress Notes (Signed)
Subjective:    Patient ID: Sierra Logan, female    DOB: 1955-10-30, 65 y.o.   MRN: [id number]  HPI  65 year old smoker presents to establish care for chronic hypoxic respiratory failure and evaluate for the possibility of hepatopulmonary syndrome.  Reviewed GI and primary care evaluation. 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  She underwent TTE with bubble study  She was hospitalized 2/22 for abdominal pain, which was attributed to splenic infarct, she was noted to be hypoxic and discharged on 3 L oxygen.  Subsequently she has weaned herself to 2 L which he uses only during ambulation and sleep, she does not use this while resting. Interestingly, she denies shortness of breath, wheezing, frequent chest colds. Her main complaint is bipedal edema, she was on 20 mg of Lasix and this was increased by PCP to 40 mg  I note that her weight was 205 on 3/15 and is 201 today Labs from 3/15 [place] normal LFTs, low ferritin with low iron saturation No leukocytosis, hemoglobin improved from 8 to 11 .5, platelets 137K  Significant tests/ events reviewed 05/2019 ANA 1-6 40 speckled pattern AMA M2 greater than 3 Alpha-1 antitrypsin normal Anti-smooth muscle antibody negative Anti-LK M1 antibody negative  10/2020 echo >> LVEF nml, RV not well visualised, no PFO on bubble study  CTA chest 08/2020 >> neg PE ,6 mm nodule in the superior segment of the right lower lobe, Mild enlargement of bilateral hilar lymph nodes measuring up to 13 mm in short axis on the right. Subcarinal lymph node measures approximately 11 mm.   Past Medical History:  Diagnosis Date  . Cirrhosis ([place])   . Hyperlipemia   . Hypertension   . Neuropathy   . Spleen disease    Past Surgical  History:  Procedure Laterality Date  . BIOPSY  10/20/2020   Procedure: BIOPSY;  Surgeon: Irving Copas., MD;  Location: [facility];  Service: Gastroenterology;;  . COLONOSCOPY WITH PROPOFOL N/A 10/20/2020   Procedure: COLONOSCOPY WITH PROPOFOL;  Surgeon: Irving Copas., MD;  Location: [facility];  Service: Gastroenterology;  Laterality: N/A;  . ESOPHAGOGASTRODUODENOSCOPY (EGD) WITH PROPOFOL N/A 10/20/2020   Procedure: ESOPHAGOGASTRODUODENOSCOPY (EGD) WITH PROPOFOL;  Surgeon: Rush Landmark Telford Nab., MD;  Location: [facility];  Service: Gastroenterology;  Laterality: N/A;  . INTRAOPERATIVE TRANSTHORACIC ECHOCARDIOGRAM  10/17/2020   Procedure: TRANSTHORACIC ECHOCARDIOGRAM;  Surgeon: Fay Records, MD;  Location: [facility];  Service: Cardiovascular;;  . POLYPECTOMY  10/20/2020   Procedure: POLYPECTOMY;  Surgeon: Irving Copas., MD;  Location: [facility];  Service: Gastroenterology;;  . RADIOFREQUENCY ABLATION  10/20/2020   Procedure: RADIO FREQUENCY ABLATION;  Surgeon: Irving Copas., MD;  Location: [facility];  Service: Gastroenterology;;  . RECONSTRUCTION OF NOSE    . TRACHEOSTOMY    . TRACHEOSTOMY CLOSURE      No Known Allergies  Social History   Socioeconomic History  . Marital status: Married    Spouse name: Not on file  . Number of children: 0  . Years of education: Not on file  . Highest education level: Not on file  Occupational History  . Occupation: part time courier  Tobacco Use  . Smoking status: Current Every Day Smoker    Types: Cigarettes  . Smokeless tobacco: Never Used  . Tobacco comment: smokes  6 cigarettes per day 11/20/20  Vaping Use  . Vaping Use: Never used  Substance and Sexual Activity  . Alcohol use: Not Currently  . Drug use: Never  . Sexual activity: Not on file  Other Topics Concern  . Not on file  Social History Narrative  . Not on file   Social Determinants of Health   Financial Resource Strain: Not on file   Food Insecurity: Not on file  Transportation Needs: Not on file  Physical Activity: Not on file  Stress: Not on file  Social Connections: Not on file  Intimate Partner Violence: Not on file     Family History  Problem Relation Age of Onset  . GER disease Mother   . Heart disease Mother   . Diabetes Father   . Hypertension Sister   . Hypertension Brother   . Prostate cancer Maternal Grandfather   . Hypertension Brother   . Hypertension Brother   . Hypertension Brother   . Colon cancer Neg Hx   . Esophageal cancer Neg Hx   . Kidney disease Neg Hx   . Liver disease Neg Hx   . Pancreatic cancer Neg Hx   . Rectal cancer Neg Hx   . Stomach cancer Neg Hx      Review of Systems  Constitutional: negative for anorexia, fevers and sweats  Eyes: negative for irritation, redness and visual disturbance  Ears, nose, mouth, throat, and face: negative for earaches, epistaxis, nasal congestion and sore throat  Respiratory: negative for cough, dyspnea on exertion, sputum and wheezing  Cardiovascular: negative for chest pain, dyspnea, lower extremity edema, orthopnea, palpitations and syncope  Gastrointestinal: negative for abdominal pain, constipation, diarrhea, melena, nausea and vomiting  Genitourinary:negative for dysuria, frequency and hematuria  Hematologic/lymphatic: negative for bleeding, easy bruising and lymphadenopathy  Musculoskeletal:negative for arthralgias, muscle weakness and stiff joints  Neurological: negative for coordination problems, gait problems, headaches and weakness  Endocrine: negative for diabetic symptoms including polydipsia, polyuria and weight loss     Objective:   Physical Exam  Gen. Pleasant, obese, in no distress, normal affect ENT - no pallor,icterus, no post nasal drip, class 2 airway Neck: No JVD, no thyromegaly, no carotid bruits Lungs: no use of accessory muscles, no dullness to percussion, decreased without rales or rhonchi  Cardiovascular:  Rhythm regular, heart sounds  normal, no murmurs or gallops, 2+ peripheral edema Abdomen: soft and non-tender, no hepatosplenomegaly, BS normal. Musculoskeletal: No deformities, no cyanosis or clubbing Neuro:  alert, non focal, no tremors       Assessment & Plan:

## 2020-11-20 NOTE — Patient Instructions (Addendum)
Schedule PFTs. This will tell us whether you have COPD related to smoking.  You have to quit cigarettes completely.  You can try to stay off oxygen for longer periods We will assess nocturnal oximetry  Ct chest WO COn aug 2022

## 2020-11-20 NOTE — Assessment & Plan Note (Addendum)
No signs of encephalopathy. GI is following and explained to her the etiology of her leg fluid and need for low sodium diet to help. Increase lasix to 40 mg BID to see if this will help with diuresis. She was aware of the varices but not the GAVE and explained to her reason for repeat EGD.

## 2020-11-20 NOTE — Addendum Note (Signed)
Addended by: Merrilee Seashore on: 11/20/2020 02:56 PM   Modules accepted: Orders

## 2020-11-20 NOTE — Assessment & Plan Note (Signed)
6 mm detected Feb 2022 and given ongoing smoking I would recommend follow up 6 months.

## 2020-11-20 NOTE — Assessment & Plan Note (Signed)
I have had our referrals department call cardiology to get the echo read as this was taken but never read back on 10/17/20. It is unclear if her hypoxia is related to this or perhaps undetected COPD given rhonchi on lung exam and likely ongoing smoking.

## 2020-11-20 NOTE — Assessment & Plan Note (Signed)
Seeing pulmonary tomorrow and echo not read which we did contact cardiology to read this. My suspicion is that she could have underlying COPD/lung disease which may be also impacted by her relatively new cirrhosis.

## 2020-11-20 NOTE — Assessment & Plan Note (Signed)
On ambulation oxygen saturation dropped from 97 to 94%, heart rate increased from 96-112  We will schedule PFTs to assess for COPD this is the most likely cause of her transient hypoxia which seems to have improved. We will schedule nocturnal oximetry to look for nocturnal desaturation and also to check for sleep disordered breathing

## 2020-11-20 NOTE — Assessment & Plan Note (Addendum)
We will arrange for a 64-month follow-up CT chest without contrast in this high-risk patient -

## 2020-11-20 NOTE — Assessment & Plan Note (Signed)
Smoking cessation was emphasized as the most important intervention

## 2020-11-21 ENCOUNTER — Telehealth: Payer: Self-pay | Admitting: Pulmonary Disease

## 2020-11-21 NOTE — Telephone Encounter (Signed)
ATC Patient.  LM to call back when available.  

## 2020-11-21 NOTE — Telephone Encounter (Signed)
Pt stated that a nurse called her yesterday in regards to a sleeping device to monitor her breathing. Did not see a note made in reference but I do see that from the [place] the pt had yesterday Dr. Elsworth Soho wrote: " We will schedule nocturnal oximetry to look for nocturnal desaturation and also to check for sleep disordered breathing" Not sure if that is what the pt is referencing. Pls regard; [phone]

## 2020-11-21 NOTE — Telephone Encounter (Signed)
Patient returned call.  Patient asked if Dr. Elsworth Soho ordered her a ONO test and when that would happen?  Advised Patient ONO was ordered at [place] 11/20/20, with Dr.Alva, and someone should contact her in the next week or two for scheduling.  Understanding stated.  Nothing further at this time.

## 2020-11-24 ENCOUNTER — Telehealth: Payer: Self-pay

## 2020-11-24 NOTE — Telephone Encounter (Signed)
-----   Message from Timothy Lasso, RN sent at 11/21/2020 10:50 AM EDT ----- Needs to set up EUS 2 weeks after MRI make sure MRI has been scheduled

## 2020-11-25 NOTE — Telephone Encounter (Signed)
The pt has not been set up for MRI as of yet. Message has been sent to the schedulers to set up.  Call placed to pt to make her aware and left a message to have her call our office.

## 2020-11-25 NOTE — Telephone Encounter (Signed)
The pt will call schedulers to set up MRI appt.  I will then set up EUS

## 2020-11-27 NOTE — Telephone Encounter (Signed)
Dr Rush Landmark can you please help me figure out why this pt needs MRI and EUS? I think I may have mistakenly sent myself a message to set MRI up and then 2 weeks later EUS on this pt in error.I believe it was another pt that I did in fact set up EUS 2 weeks after MRI.   Does this pt need MRI or EUS?  Sorry for the confusion

## 2020-11-28 ENCOUNTER — Other Ambulatory Visit: Payer: Self-pay

## 2020-11-28 DIAGNOSIS — D509 Iron deficiency anemia, unspecified: Secondary | ICD-10-CM

## 2020-11-28 NOTE — Telephone Encounter (Signed)
Left message on machine to call back  The pt has an appt on 5/17 Labs entered will try to reach pt to set up procedure and have her come in for labs

## 2020-11-28 NOTE — Telephone Encounter (Signed)
Sierra Logan, No worries. Do not recall an EUS or MRI be necessary. She does need an EGD and follow-up from her last for further GAVE treatment.  So we can move forward and make sure she has that scheduled that would be great. Lets have the patient come in for a CBC/iron/TIBC/ferritin the day prior to her clinic visit next week. Thanks. GM

## 2020-12-01 ENCOUNTER — Other Ambulatory Visit (INDEPENDENT_AMBULATORY_CARE_PROVIDER_SITE_OTHER): Payer: 59

## 2020-12-01 DIAGNOSIS — D509 Iron deficiency anemia, unspecified: Secondary | ICD-10-CM

## 2020-12-01 LAB — CBC WITH DIFFERENTIAL/PLATELET
Basophils Absolute: 0 10*3/uL (ref 0.0–0.1)
Basophils Relative: 0.5 % (ref 0.0–3.0)
Eosinophils Absolute: 0.1 10*3/uL (ref 0.0–0.7)
Eosinophils Relative: 1.4 % (ref 0.0–5.0)
HCT: 38.4 % (ref 36.0–46.0)
Hemoglobin: 13.1 g/dL (ref 12.0–15.0)
Lymphocytes Relative: 23.7 % (ref 12.0–46.0)
Lymphs Abs: 2 10*3/uL (ref 0.7–4.0)
MCHC: 34.2 g/dL (ref 30.0–36.0)
MCV: 87.8 fl (ref 78.0–100.0)
Monocytes Absolute: 0.6 10*3/uL (ref 0.1–1.0)
Monocytes Relative: 7.2 % (ref 3.0–12.0)
Neutro Abs: 5.6 10*3/uL (ref 1.4–7.7)
Neutrophils Relative %: 67.2 % (ref 43.0–77.0)
Platelets: 156 10*3/uL (ref 150.0–400.0)
RBC: 4.37 Mil/uL (ref 3.87–5.11)
RDW: 16.3 % — ABNORMAL HIGH (ref 11.5–15.5)
WBC: 8.3 10*3/uL (ref 4.0–10.5)

## 2020-12-01 LAB — IBC + FERRITIN
Ferritin: 27.2 ng/mL (ref 10.0–291.0)
Iron: 45 ug/dL (ref 42–145)
Saturation Ratios: 10.8 % — ABNORMAL LOW (ref 20.0–50.0)
Transferrin: 299 mg/dL (ref 212.0–360.0)

## 2020-12-01 NOTE — Telephone Encounter (Signed)
Spoke with the pt and she has been advised of the appt tomorrow with Dr Rush Landmark. She will try and come in for labs today.  The order has been entered.

## 2020-12-02 ENCOUNTER — Telehealth: Payer: Self-pay | Admitting: *Deleted

## 2020-12-02 ENCOUNTER — Ambulatory Visit (INDEPENDENT_AMBULATORY_CARE_PROVIDER_SITE_OTHER): Payer: 59 | Admitting: Gastroenterology

## 2020-12-02 ENCOUNTER — Other Ambulatory Visit: Payer: Self-pay

## 2020-12-02 ENCOUNTER — Encounter: Payer: Self-pay | Admitting: Gastroenterology

## 2020-12-02 VITALS — BP 120/60 | HR 100 | Ht 61.5 in | Wt 197.0 lb

## 2020-12-02 DIAGNOSIS — D509 Iron deficiency anemia, unspecified: Secondary | ICD-10-CM

## 2020-12-02 DIAGNOSIS — K31819 Angiodysplasia of stomach and duodenum without bleeding: Secondary | ICD-10-CM

## 2020-12-02 DIAGNOSIS — R197 Diarrhea, unspecified: Secondary | ICD-10-CM

## 2020-12-02 DIAGNOSIS — K7460 Unspecified cirrhosis of liver: Secondary | ICD-10-CM

## 2020-12-02 DIAGNOSIS — R0902 Hypoxemia: Secondary | ICD-10-CM

## 2020-12-02 DIAGNOSIS — I8510 Secondary esophageal varices without bleeding: Secondary | ICD-10-CM | POA: Diagnosis not present

## 2020-12-02 DIAGNOSIS — K743 Primary biliary cirrhosis: Secondary | ICD-10-CM | POA: Diagnosis not present

## 2020-12-02 DIAGNOSIS — K7681 Hepatopulmonary syndrome: Secondary | ICD-10-CM

## 2020-12-02 NOTE — Telephone Encounter (Signed)
Received vm message from patient . She had some labs done @ Dr. Donneta Romberg office on 12/01/20- CBC , IBC and Ferritin. She wants to know if you will still need labs when she sees you on 12/04/20.  Please advise

## 2020-12-02 NOTE — Patient Instructions (Signed)
If you are age 65 or younger, your body mass index should be between 19-25. Your Body mass index is 36.62 kg/m. If this is out of the aformentioned range listed, please consider follow up with your Primary Care Provider.   You have been scheduled for an endoscopy. Please follow written instructions given to you at your visit today. If you use inhalers (even only as needed), please bring them with you on the day of your procedure.  Due to recent changes in healthcare laws, you may see the results of your imaging and laboratory studies on MyChart before your provider has had a chance to review them.  We understand that in some cases there may be results that are confusing or concerning to you. Not all laboratory results come back in the same time frame and the provider may be waiting for multiple results in order to interpret others.  Please give [place] 48 hours in order for your provider to thoroughly review all the results before contacting the office for clarification of your results.   You may take Iodium 1mg  tablet by mouth 1-2 times daily as needed for acute diarrhea.   Your provider has requested that you go to the basement level for lab work before leaving today. Press "B" on the elevator. The lab is located at the first door on the left as you exit the elevator.  Due to recent changes in healthcare laws, you may see the results of your imaging and laboratory studies on MyChart before your provider has had a chance to review them.  We understand that in some cases there may be results that are confusing or concerning to you. Not all laboratory results come back in the same time frame and the provider may be waiting for multiple results in order to interpret others.  Please give [place] 48 hours in order for your provider to thoroughly review all the results before contacting the office for clarification of your results.   Thank you for choosing me and [facility].  [name]

## 2020-12-02 NOTE — Progress Notes (Signed)
[name] VISIT   Primary Care Provider Sierra Koch, MD [address] [place] [address] [phone]   Patient Profile: Sierra Logan is a 65 y.o. 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  The patient presents to the [facility] for an evaluation and management of problem(s) noted below:  Problem List 1. Primary biliary cholangitis ([place])   2. 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 Secondary esophageal varices without bleeding ([place])   4. GAVE (gastric antral vascular ectasia)   5. Iron deficiency anemia, unspecified iron deficiency anemia type     History of Present Illness Please see initial consultation note for full details of HPI.  Interval History The patient returns for scheduled follow-up.  Since our last visit we underwent endoscopic evaluation from above and below.  She was found to have stable esophageal varices without stigmata as well as evidence of gastritis and GAVE which was treated with RFA therapy.  Tubular adenomas were found on colonoscopy but the preparation was inadequate for true screening purposes.  Patient has undergone repeat labs showing improvement in blood counts though iron deficiency does still persist.  Patient has felt stronger since her last procedure.  She remains on ursodiol at this time.  She remains on PPI therapy at this time.   The patient denies any issues with jaundice, scleral icterus, generalized pruritus, darkened/amber urine, clay-colored stools, LE edema, hematemesis, coffee-ground emesis, abdominal distention, confusion.  The patient is followed up with our referral to our pulmonary colleagues and she is now off oxygen.  She is happy about this.  She  is ready for the next steps in her evaluation and she just wants to remain healthy and not have any progression of her liver disease if possible.  She does have some chronic loose bowel movements that occurs at times.  No blood in her stools.  No incontinence.  GI Review of Systems Positive as above including at times darker stool which she attributes to her iron Negative for odynophagia, dysphagia, bloating, hematochezia   Review of Systems General: Denies fevers/chills/weight loss unintentionally Cardiovascular: Denies chest pain/palpitations Pulmonary: Shortness of breath continues to improve Gastroenterological: See HPI Genitourinary: Denies darkened urine Hematological: Denies easy bruising/bleeding Dermatological: Denies jaundice Psychological: Mood is stable   Medications Current Outpatient Medications  Medication Sig Dispense Refill  . acetaminophen (TYLENOL) 500 MG tablet Take 500-1,000 mg by mouth every 8 (eight) hours as needed for mild pain or headache.    Marland Kitchen atorvastatin (LIPITOR) 10 MG tablet Take 10 mg by mouth at bedtime.    Marland Kitchen BIOTIN PO Take 1 tablet by mouth daily with breakfast.    . Calcium Carb-Cholecalciferol (CALCIUM+D3 PO) Take 1 tablet by mouth daily after breakfast.    . Cholecalciferol (VITAMIN D3 PO) Take 1 tablet by mouth daily.    . ferrous sulfate 325 (65 FE) MG tablet Take 1 tablet (325 mg total) by mouth 2 (two) times daily with a meal. 60 tablet 3  . furosemide (LASIX) 40 MG tablet Take 1 tablet (40 mg total) by mouth 2 (two) times daily. 60 tablet 3  . gabapentin (NEURONTIN) 400 MG capsule Take 400-800 mg by mouth See admin instructions. Take 400 mg by mouth in the morning and 800 mg at bedtime    . Omega-3 Fatty  Acids (FISH OIL PO) Take 1 capsule by mouth daily with breakfast.    . omeprazole (PRILOSEC) 40 MG capsule Take 1 capsule (40 mg total) by mouth 2 (two) times daily before a meal. 60 capsule 6  . sucralfate (CARAFATE) 1 g tablet Take 1 tablet (1  g total) by mouth 4 (four) times daily -  with meals and at bedtime. 120 tablet 0  . ursodiol (ACTIGALL) 300 MG capsule Take 2 capsules (600 mg total) by mouth 2 (two) times daily before a meal. 120 capsule 6  . VITAMIN E PO Take 1 capsule by mouth daily with breakfast.    . sucralfate (CARAFATE) 1 GM/10ML suspension Take 10 mLs (1 g total) by mouth 4 (four) times daily -  with meals and at bedtime. 420 mL 2   No current facility-administered medications for this visit.    Allergies No Known Allergies  Histories Past Medical History:  Diagnosis Date  . Cirrhosis ([place])   . Hyperlipemia   . Hypertension   . Neuropathy   . Spleen disease    Past Surgical History:  Procedure Laterality Date  . BIOPSY  10/20/2020   Procedure: BIOPSY;  Surgeon: Irving Copas., MD;  Location: [facility];  Service: Gastroenterology;;  . COLONOSCOPY WITH PROPOFOL N/A 10/20/2020   Procedure: COLONOSCOPY WITH PROPOFOL;  Surgeon: Irving Copas., MD;  Location: [facility];  Service: Gastroenterology;  Laterality: N/A;  . ESOPHAGOGASTRODUODENOSCOPY (EGD) WITH PROPOFOL N/A 10/20/2020   Procedure: ESOPHAGOGASTRODUODENOSCOPY (EGD) WITH PROPOFOL;  Surgeon: Rush Landmark Telford Nab., MD;  Location: [facility];  Service: Gastroenterology;  Laterality: N/A;  . INTRAOPERATIVE TRANSTHORACIC ECHOCARDIOGRAM  10/17/2020   Procedure: TRANSTHORACIC ECHOCARDIOGRAM;  Surgeon: Fay Records, MD;  Location: [facility];  Service: Cardiovascular;;  . POLYPECTOMY  10/20/2020   Procedure: POLYPECTOMY;  Surgeon: Irving Copas., MD;  Location: [facility];  Service: Gastroenterology;;  . RADIOFREQUENCY ABLATION  10/20/2020   Procedure: RADIO FREQUENCY ABLATION;  Surgeon: Irving Copas., MD;  Location: [facility];  Service: Gastroenterology;;  . RECONSTRUCTION OF NOSE    . TRACHEOSTOMY    . TRACHEOSTOMY CLOSURE     Social History   Socioeconomic History  . Marital status: Married    Spouse name:  Not on file  . Number of children: 0  . Years of education: Not on file  . Highest education level: Not on file  Occupational History  . Occupation: part time courier  Tobacco Use  . Smoking status: Current Every Day Smoker    Types: Cigarettes  . Smokeless tobacco: Never Used  . Tobacco comment: smokes 6 cigarettes per day 11/20/20  Vaping Use  . Vaping Use: Never used  Substance and Sexual Activity  . Alcohol use: Not Currently  . Drug use: Never  . Sexual activity: Not on file  Other Topics Concern  . Not on file  Social History Narrative  . Not on file   Social Determinants of Health   Financial Resource Strain: Not on file  Food Insecurity: Not on file  Transportation Needs: Not on file  Physical Activity: Not on file  Stress: Not on file  Social Connections: Not on file  Intimate Partner Violence: Not on file   Family History  Problem Relation Age of Onset  . GER disease Mother   . Heart disease Mother   . Diabetes Father   . Hypertension Sister   . Hypertension Brother   . Prostate cancer Maternal Grandfather   . Hypertension Brother   .  Hypertension Brother   . Hypertension Brother   . Colon cancer Neg Hx   . Esophageal cancer Neg Hx   . Kidney disease Neg Hx   . Liver disease Neg Hx   . Pancreatic cancer Neg Hx   . Rectal cancer Neg Hx   . Stomach cancer Neg Hx    I have reviewed her medical, social, and family history in detail and updated the electronic medical record as necessary.    PHYSICAL EXAMINATION  BP 120/60   Pulse 100   Ht 5' 1.5" (1.562 m)   Wt 197 lb (89.4 kg)   BMI 36.62 kg/m  Wt Readings from Last 3 Encounters:  12/02/20 197 lb (89.4 kg)  11/20/20 201 lb 3.2 oz (91.3 kg)  11/19/20 202 lb 9.6 oz (91.9 kg)  GEN: NAD, appears stated age, does not appear chronically ill as she has previously, nontoxic PSYCH: Cooperative, without pressured speech EYE: Conjunctivae pink, sclerae anicteric ENT: Masked CV: Nontachycardic RESP: No  audible wheezing GI: NABS, soft, protuberant abdomen, nontender, no rebound or guarding MSK/EXT: Trace bilateral lower extremity edema is present SKIN: Spider angiomata noted on upper thorax; no jaundice NEURO:  Alert & Oriented x 3, no focal deficits, no evidence of asterixis   REVIEW OF DATA  I reviewed the following data at the time of this encounter:  GI Procedures and Studies  April 2022 EGD - No gross lesions in esophagus proximally. Grade II esophageal varices distally. - Z-line regular, 40 cm from the incisors. - Erythematous mucosa in the cardia. Portal hypertensive gastropathy throughout. - Gastric antral vascular ectasia with bleeding. Treated with radiofrequency ablation. - Biopsies were taken with a cold forceps for histology and Helicobacter pylori testing. - Nodular mucosa in the duodenal bulb and in the second portion of the duodenum. Biopsied.  April 2022 colonoscopy - Preparation of the colon was fair even after copious lavage. - Perianal erythema found on perianal exam. Hemorrhoids found on digital rectal exam. - One 6 mm polyp in the transverse colon, removed with a cold snare. Resected and retrieved. - Stool in the entire examined colon. - Diverticulosis in the recto-sigmoid colon, in the sigmoid colon and in the descending colon. - Non-bleeding non-thrombosed external and internal hemorrhoids.  Pathology FINAL MICROSCOPIC DIAGNOSIS:  A. STOMACH, BIOPSY:  - Gastric antral mucosa showing marked nonspecific reactive gastropathy  with changes suggestive of adjacent erosion  - Gastric oxyntic mucosa with no specific histopathologic changes  - Warthin Starry stain is negative for Helicobacter pylori  B. DUODENUM, NODULES, BIOPSY:  - Polypoid duodenal mucosa showing chronic duodenitis with extensive  surface gastric foveolar metaplasia, see comment  - Negative for dysplasia or malignancy  C. COLON, TRANSVERSE, POLYPECTOMY:  - Tubular adenoma  - Negative for  high-grade dysplasia or malignancy   Laboratory Studies  Reviewed those in epic Imaging Studies  No new imaging studies to review   ASSESSMENT  Ms. Bakken is a 65 y.o. 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  The patient is seen today for evaluation and management of:  1. Primary biliary cholangitis ([place])   2. 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 Secondary esophageal varices without bleeding ([place])   4. GAVE (gastric antral vascular ectasia)   5. Iron deficiency anemia, unspecified iron deficiency anemia type    The patient  is hemodynamically and clinically stable at this time.  It looks like we have made some progress in her iron deficiency with her recent endoscopy and findings of GAVE status post RFA treatment.  We need to repeat this to see if any additional therapy is required.  If the GAVE is being driven by portal hypertension then potential initiation of beta-blockade could help [place] as well.  I have asked the patient to take her blood pressure and heart rates over the course of the next few weeks and then update [place] with a journal log to see if we can initiate a nonselective beta-blocker or carvedilol for primary prophylaxis against variceal bleeding versus if she has too low of a blood pressure or heart rate consideration of endoscopic band ligation therapy.  The patient will continue on her ursodiol.  We will monitor her alkaline phosphatase and liver biochemical testing.  If alkaline phosphatase does not normalize we will need to consider other medications or referral to a quaternary center for further evaluation.  It is not clear that she is a candidate for liver transplantation nor is she sure she would ever want that we will need to keep this in her mind.   The risks and benefits of endoscopic evaluation were discussed with the patient; these include but are not limited to the risk of perforation, infection, bleeding, missed lesions, lack of diagnosis, severe illness requiring hospitalization, as well as anesthesia and sedation related illnesses.  I offered the patient to undergo repeat colonoscopy with 2-day preparation at time of next EGD but she defers on this (she has a recall for within a 1 year time period).  The patient is agreeable to proceed.  All patient questions were answered to the best of my ability, and the patient agrees to the aforementioned plan of action with follow-up as indicated.   PLAN  Laboratories as outlined below Stool studies as outlined below Fecal elastase to be obtained Consider SIBO breath testing in future EGD with repeat RFA availability in coming weeks Colonoscopy within 1 year time.  For true colon cancer screening with 2-day preparation Appreciate follow-up with hematology in regards to any additional therapies or infusions that may be required though hopefully she is stable and further RFA if necessary will help prevent her from having recurrence Check heart rate and blood pressures a few times per week and bring journal so that we may decide whether you may be candidate for beta-blockade  #ESLD Management Volume -No diuretics currently -Obtain daily standing weight -2000 mg Na diet Infection -No evidence of SBP currently Bleeding -We will see if will be able to initiate beta-blockade versus need for EVL Encephalopathy -No evidence of at this time Screening -Up-to-date based on 2/22 CT abdomen/pelvis and will need repeat evaluation by August 2022 Transplant -Not clear that she needs or has a high enough meld or even wants at this time Vaccination -Recommended HAV and HBV vaccination/immunization Other -Promote intake of 1.5 g/kg/day of Protein (Ensures/Boosts at each meal)   Orders Placed This  Encounter  Procedures  . Procedural/ Surgical Case Request: ESOPHAGOGASTRODUODENOSCOPY (EGD), GI RADIOFREQUENCY ABLATION  . Stool Culture  . Ova and parasite examination  . Clostridium difficile Toxin B, Qualitative, Real-Time PCR  . Pancreatic elastase, fecal  . Ambulatory referral to Gastroenterology    New Prescriptions   No medications on file   Modified Medications   No medications on file    Planned Follow Up No follow-ups on file.   Total Time  in [place] and in [place] for patient including independent/personal interpretation/review of prior testing, medical history, examination, medication adjustment, communicating results with the patient directly, and documentation with the EHR is 30 minutes.   Justice Britain, MD [facility] Office # [phone]

## 2020-12-02 NOTE — H&P (View-Only) (Signed)
[name]ISIT   Primary Care Provider Hoyt Koch, MD [address] [place] [address] [phone]   Patient Profile: Sierra Logan is a 65 y.o. 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  The patient presents to the [facility] for an evaluation and management of problem(s) noted below:  Problem List 1. Primary biliary cholangitis ([place])   2. 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 Secondary esophageal varices without bleeding ([place])   4. GAVE (gastric antral vascular ectasia)   5. Iron deficiency anemia, unspecified iron deficiency anemia type     History of Present Illness Please see initial consultation note for full details of HPI.  Interval History The patient returns for scheduled follow-up.  Since our last visit we underwent endoscopic evaluation from above and below.  She was found to have stable esophageal varices without stigmata as well as evidence of gastritis and GAVE which was treated with RFA therapy.  Tubular adenomas were found on colonoscopy but the preparation was inadequate for true screening purposes.  Patient has undergone repeat labs showing improvement in blood counts though iron deficiency does still persist.  Patient has felt stronger since her last procedure.  She remains on ursodiol at this time.  She remains on PPI therapy at this time.   The patient denies any issues with jaundice, scleral icterus, generalized pruritus, darkened/amber urine, clay-colored stools, LE edema, hematemesis, coffee-ground emesis, abdominal distention, confusion.  The patient is followed up with our referral to our pulmonary colleagues and she is now off oxygen.  She is happy about this.  She  is ready for the next steps in her evaluation and she just wants to remain healthy and not have any progression of her liver disease if possible.  She does have some chronic loose bowel movements that occurs at times.  No blood in her stools.  No incontinence.  GI Review of Systems Positive as above including at times darker stool which she attributes to her iron Negative for odynophagia, dysphagia, bloating, hematochezia   Review of Systems General: Denies fevers/chills/weight loss unintentionally Cardiovascular: Denies chest pain/palpitations Pulmonary: Shortness of breath continues to improve Gastroenterological: See HPI Genitourinary: Denies darkened urine Hematological: Denies easy bruising/bleeding Dermatological: Denies jaundice Psychological: Mood is stable   Medications Current Outpatient Medications  Medication Sig Dispense Refill  . acetaminophen (TYLENOL) 500 MG tablet Take 500-1,000 mg by mouth every 8 (eight) hours as needed for mild pain or headache.    Marland Kitchen atorvastatin (LIPITOR) 10 MG tablet Take 10 mg by mouth at bedtime.    Marland Kitchen BIOTIN PO Take 1 tablet by mouth daily with breakfast.    . Calcium Carb-Cholecalciferol (CALCIUM+D3 PO) Take 1 tablet by mouth daily after breakfast.    . Cholecalciferol (VITAMIN D3 PO) Take 1 tablet by mouth daily.    . ferrous sulfate 325 (65 FE) MG tablet Take 1 tablet (325 mg total) by mouth 2 (two) times daily with a meal. 60 tablet 3  . furosemide (LASIX) 40 MG tablet Take 1 tablet (40 mg total) by mouth 2 (two) times daily. 60 tablet 3  . gabapentin (NEURONTIN) 400 MG capsule Take 400-800 mg by mouth See admin instructions. Take 400 mg by mouth in the morning and 800 mg at bedtime    . Omega-3 Fatty  Acids (FISH OIL PO) Take 1 capsule by mouth daily with breakfast.    . omeprazole (PRILOSEC) 40 MG capsule Take 1 capsule (40 mg total) by mouth 2 (two) times daily before a meal. 60 capsule 6  . sucralfate (CARAFATE) 1 g tablet Take 1 tablet (1  g total) by mouth 4 (four) times daily -  with meals and at bedtime. 120 tablet 0  . ursodiol (ACTIGALL) 300 MG capsule Take 2 capsules (600 mg total) by mouth 2 (two) times daily before a meal. 120 capsule 6  . VITAMIN E PO Take 1 capsule by mouth daily with breakfast.    . sucralfate (CARAFATE) 1 GM/10ML suspension Take 10 mLs (1 g total) by mouth 4 (four) times daily -  with meals and at bedtime. 420 mL 2   No current facility-administered medications for this visit.    Allergies No Known Allergies  Histories Past Medical History:  Diagnosis Date  . Cirrhosis ([place])   . Hyperlipemia   . Hypertension   . Neuropathy   . Spleen disease    Past Surgical History:  Procedure Laterality Date  . BIOPSY  10/20/2020   Procedure: BIOPSY;  Surgeon: Irving Copas., MD;  Location: [facility];  Service: Gastroenterology;;  . COLONOSCOPY WITH PROPOFOL N/A 10/20/2020   Procedure: COLONOSCOPY WITH PROPOFOL;  Surgeon: Irving Copas., MD;  Location: [facility];  Service: Gastroenterology;  Laterality: N/A;  . ESOPHAGOGASTRODUODENOSCOPY (EGD) WITH PROPOFOL N/A 10/20/2020   Procedure: ESOPHAGOGASTRODUODENOSCOPY (EGD) WITH PROPOFOL;  Surgeon: Rush Landmark Telford Nab., MD;  Location: [facility];  Service: Gastroenterology;  Laterality: N/A;  . INTRAOPERATIVE TRANSTHORACIC ECHOCARDIOGRAM  10/17/2020   Procedure: TRANSTHORACIC ECHOCARDIOGRAM;  Surgeon: Fay Records, MD;  Location: [place];  Service: Cardiovascular;;  . POLYPECTOMY  10/20/2020   Procedure: POLYPECTOMY;  Surgeon: Irving Copas., MD;  Location: [facility];  Service: Gastroenterology;;  . RADIOFREQUENCY ABLATION  10/20/2020   Procedure: RADIO FREQUENCY ABLATION;  Surgeon: Irving Copas., MD;  Location: [facility];  Service: Gastroenterology;;  . RECONSTRUCTION OF NOSE    . TRACHEOSTOMY    . TRACHEOSTOMY CLOSURE     Social History   Socioeconomic History  . Marital status: Married    Spouse name:  Not on file  . Number of children: 0  . Years of education: Not on file  . Highest education level: Not on file  Occupational History  . Occupation: part time courier  Tobacco Use  . Smoking status: Current Every Day Smoker    Types: Cigarettes  . Smokeless tobacco: Never Used  . Tobacco comment: smokes 6 cigarettes per day 11/20/20  Vaping Use  . Vaping Use: Never used  Substance and Sexual Activity  . Alcohol use: Not Currently  . Drug use: Never  . Sexual activity: Not on file  Other Topics Concern  . Not on file  Social History Narrative  . Not on file   Social Determinants of Health   Financial Resource Strain: Not on file  Food Insecurity: Not on file  Transportation Needs: Not on file  Physical Activity: Not on file  Stress: Not on file  Social Connections: Not on file  Intimate Partner Violence: Not on file   Family History  Problem Relation Age of Onset  . GER disease Mother   . Heart disease Mother   . Diabetes Father   . Hypertension Sister   . Hypertension Brother   . Prostate cancer Maternal Grandfather   . Hypertension Brother   .  Hypertension Brother   . Hypertension Brother   . Colon cancer Neg Hx   . Esophageal cancer Neg Hx   . Kidney disease Neg Hx   . Liver disease Neg Hx   . Pancreatic cancer Neg Hx   . Rectal cancer Neg Hx   . Stomach cancer Neg Hx    I have reviewed her medical, social, and family history in detail and updated the electronic medical record as necessary.    PHYSICAL EXAMINATION  BP 120/60   Pulse 100   Ht 5' 1.5" (1.562 m)   Wt 197 lb (89.4 kg)   BMI 36.62 kg/m  Wt Readings from Last 3 Encounters:  12/02/20 197 lb (89.4 kg)  11/20/20 201 lb 3.2 oz (91.3 kg)  11/19/20 202 lb 9.6 oz (91.9 kg)  GEN: NAD, appears stated age, does not appear chronically ill as she has previously, nontoxic PSYCH: Cooperative, without pressured speech EYE: Conjunctivae pink, sclerae anicteric ENT: Masked CV: Nontachycardic RESP: No  audible wheezing GI: NABS, soft, protuberant abdomen, nontender, no rebound or guarding MSK/EXT: Trace bilateral lower extremity edema is present SKIN: Spider angiomata noted on upper thorax; no jaundice NEURO:  Alert & Oriented x 3, no focal deficits, no evidence of asterixis   REVIEW OF DATA  I reviewed the following data at the time of this encounter:  GI Procedures and Studies  April 2022 EGD - No gross lesions in esophagus proximally. Grade II esophageal varices distally. - Z-line regular, 40 cm from the incisors. - Erythematous mucosa in the cardia. Portal hypertensive gastropathy throughout. - Gastric antral vascular ectasia with bleeding. Treated with radiofrequency ablation. - Biopsies were taken with a cold forceps for histology and Helicobacter pylori testing. - Nodular mucosa in the duodenal bulb and in the second portion of the duodenum. Biopsied.  April 2022 colonoscopy - Preparation of the colon was fair even after copious lavage. - Perianal erythema found on perianal exam. Hemorrhoids found on digital rectal exam. - One 6 mm polyp in the transverse colon, removed with a cold snare. Resected and retrieved. - Stool in the entire examined colon. - Diverticulosis in the recto-sigmoid colon, in the sigmoid colon and in the descending colon. - Non-bleeding non-thrombosed external and internal hemorrhoids.  Pathology FINAL MICROSCOPIC DIAGNOSIS:  A. STOMACH, BIOPSY:  - Gastric antral mucosa showing marked nonspecific reactive gastropathy  with changes suggestive of adjacent erosion  - Gastric oxyntic mucosa with no specific histopathologic changes  - Warthin Starry stain is negative for Helicobacter pylori  B. DUODENUM, NODULES, BIOPSY:  - Polypoid duodenal mucosa showing chronic duodenitis with extensive  surface gastric foveolar metaplasia, see comment  - Negative for dysplasia or malignancy  C. COLON, TRANSVERSE, POLYPECTOMY:  - Tubular adenoma  - Negative for  high-grade dysplasia or malignancy   Laboratory Studies  Reviewed those in epic Imaging Studies  No new imaging studies to review   ASSESSMENT  Ms. Bakken is a 65 y.o. 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  The patient is seen today for evaluation and management of:  1. Primary biliary cholangitis ([place])   2. 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 Secondary esophageal varices without bleeding ([place])   4. GAVE (gastric antral vascular ectasia)   5. Iron deficiency anemia, unspecified iron deficiency anemia type    The patient  is hemodynamically and clinically stable at this time.  It looks like we have made some progress in her iron deficiency with her recent endoscopy and findings of GAVE status post RFA treatment.  We need to repeat this to see if any additional therapy is required.  If the GAVE is being driven by portal hypertension then potential initiation of beta-blockade could help [place] as well.  I have asked the patient to take her blood pressure and heart rates over the course of the next few weeks and then update [place] with a journal log to see if we can initiate a nonselective beta-blocker or carvedilol for primary prophylaxis against variceal bleeding versus if she has too low of a blood pressure or heart rate consideration of endoscopic band ligation therapy.  The patient will continue on her ursodiol.  We will monitor her alkaline phosphatase and liver biochemical testing.  If alkaline phosphatase does not normalize we will need to consider other medications or referral to a quaternary center for further evaluation.  It is not clear that she is a candidate for liver transplantation nor is she sure she would ever want that we will need to keep this in her mind.   The risks and benefits of endoscopic evaluation were discussed with the patient; these include but are not limited to the risk of perforation, infection, bleeding, missed lesions, lack of diagnosis, severe illness requiring hospitalization, as well as anesthesia and sedation related illnesses.  I offered the patient to undergo repeat colonoscopy with 2-day preparation at time of next EGD but she defers on this (she has a recall for within a 1 year time period).  The patient is agreeable to proceed.  All patient questions were answered to the best of my ability, and the patient agrees to the aforementioned plan of action with follow-up as indicated.   PLAN  Laboratories as outlined below Stool studies as outlined below Fecal elastase to be obtained Consider SIBO breath testing in future EGD with repeat RFA availability in coming weeks Colonoscopy within 1 year time.  For true colon cancer screening with 2-day preparation Appreciate follow-up with hematology in regards to any additional therapies or infusions that may be required though hopefully she is stable and further RFA if necessary will help prevent her from having recurrence Check heart rate and blood pressures a few times per week and bring journal so that we may decide whether you may be candidate for beta-blockade  #ESLD Management Volume -No diuretics currently -Obtain daily standing weight -2000 mg Na diet Infection -No evidence of SBP currently Bleeding -We will see if will be able to initiate beta-blockade versus need for EVL Encephalopathy -No evidence of at this time Screening -Up-to-date based on 2/22 CT abdomen/pelvis and will need repeat evaluation by August 2022 Transplant -Not clear that she needs or has a high enough meld or even wants at this time Vaccination -Recommended HAV and HBV vaccination/immunization Other -Promote intake of 1.5 g/kg/day of Protein (Ensures/Boosts at each meal)   Orders Placed This  Encounter  Procedures  . Procedural/ Surgical Case Request: ESOPHAGOGASTRODUODENOSCOPY (EGD), GI RADIOFREQUENCY ABLATION  . Stool Culture  . Ova and parasite examination  . Clostridium difficile Toxin B, Qualitative, Real-Time PCR  . Pancreatic elastase, fecal  . Ambulatory referral to Gastroenterology    New Prescriptions   No medications on file   Modified Medications   No medications on file    Planned Follow Up No follow-ups on file.   Total Time  in [place] and in [place] for patient including independent/personal interpretation/review of prior testing, medical history, examination, medication adjustment, communicating results with the patient directly, and documentation with the EHR is 30 minutes.   Justice Britain, MD [facility] Office # [phone]

## 2020-12-02 NOTE — Progress Notes (Signed)
Order release - no longer needed.

## 2020-12-03 ENCOUNTER — Encounter: Payer: Self-pay | Admitting: Gastroenterology

## 2020-12-03 DIAGNOSIS — K31819 Angiodysplasia of stomach and duodenum without bleeding: Secondary | ICD-10-CM | POA: Insufficient documentation

## 2020-12-03 DIAGNOSIS — I8510 Secondary esophageal varices without bleeding: Secondary | ICD-10-CM | POA: Insufficient documentation

## 2020-12-04 ENCOUNTER — Encounter: Payer: Self-pay | Admitting: Hematology and Oncology

## 2020-12-04 ENCOUNTER — Ambulatory Visit: Payer: 59 | Admitting: Hematology and Oncology

## 2020-12-04 ENCOUNTER — Other Ambulatory Visit: Payer: Self-pay

## 2020-12-04 ENCOUNTER — Inpatient Hospital Stay: Payer: 59

## 2020-12-04 ENCOUNTER — Inpatient Hospital Stay: Payer: 59 | Attending: Hematology and Oncology | Admitting: Hematology and Oncology

## 2020-12-04 ENCOUNTER — Other Ambulatory Visit: Payer: 59

## 2020-12-04 VITALS — BP 128/63 | HR 99 | Temp 99.3°F | Resp 18 | Ht 61.0 in | Wt 195.2 lb

## 2020-12-04 DIAGNOSIS — D500 Iron deficiency anemia secondary to blood loss (chronic): Secondary | ICD-10-CM | POA: Diagnosis not present

## 2020-12-04 DIAGNOSIS — Z79899 Other long term (current) drug therapy: Secondary | ICD-10-CM | POA: Insufficient documentation

## 2020-12-04 DIAGNOSIS — D735 Infarction of spleen: Secondary | ICD-10-CM

## 2020-12-04 DIAGNOSIS — F17210 Nicotine dependence, cigarettes, uncomplicated: Secondary | ICD-10-CM | POA: Insufficient documentation

## 2020-12-04 DIAGNOSIS — D509 Iron deficiency anemia, unspecified: Secondary | ICD-10-CM | POA: Insufficient documentation

## 2020-12-04 NOTE — Progress Notes (Signed)
[place] [phone]   [phone]  PROGRESS NOTE  Patient Care Team: Hoyt Koch, MD as PCP - General (Internal Medicine)  Hematological/Oncological History # Splenic Infarct  08/27/2020: patient presented to [facility] ED with abdominal pain. CT abdomen showed advanced cirrhosis of the liver. Splenomegaly. 4-5 cm hypoperfused area in the anterior superior corner of the spleen presumably relating to a splenic infarction  #Iron Deficiency Anemia of Unclear Etiology, Likely GI Bleed 08/27/2020: WBC 11.4, Hgb 8.1, MCV 75.6, Plt 196 08/28/2020: Iron 20, TIBC 466, Sat 4%, Ferritin 8 12/01/2020: WBC 8.3, Hgb 13.1, MCV 87.8, Plt 156. Ferritin 27.2  Interval History:  Sierra Logan 65 y.o. female with medical history significant for splenic infarct and iron deficiency anemia who presents for a follow up visit. The patient was last seen on 09/12/2020. In the interim since the last visit she has established with GI was diagnosed with primary biliary cirrhosis.  On exam today Ms.Davern reports she has been well in the interim since her last visit.  She reports that her energy is "exactly the same as it was".  She still remains tired though she reports she does not sleep well.  She has not been having any issues with bleeding, bruising, or dark stools.  She also denies having issues with lightheadedness or dizziness.  She is been tolerating the p.o. iron pills well without any constipation or stomach upset.  She also denies any fevers, chills, sweats, nausea, vomiting, or diarrhea.  A full 10 point ROS is listed below.  MEDICAL HISTORY:  Past Medical History:  Diagnosis Date  . Cirrhosis ([place])   . Hyperlipemia   . Hypertension   . Neuropathy   . Spleen disease     SURGICAL HISTORY: Past Surgical History:  Procedure Laterality Date  . BIOPSY  10/20/2020   Procedure: BIOPSY;  Surgeon: Irving Copas., MD;  Location: [facility];  Service:  Gastroenterology;;  . COLONOSCOPY WITH PROPOFOL N/A 10/20/2020   Procedure: COLONOSCOPY WITH PROPOFOL;  Surgeon: Irving Copas., MD;  Location: [facility];  Service: Gastroenterology;  Laterality: N/A;  . ESOPHAGOGASTRODUODENOSCOPY (EGD) WITH PROPOFOL N/A 10/20/2020   Procedure: ESOPHAGOGASTRODUODENOSCOPY (EGD) WITH PROPOFOL;  Surgeon: Rush Landmark Telford Nab., MD;  Location: [facility];  Service: Gastroenterology;  Laterality: N/A;  . INTRAOPERATIVE TRANSTHORACIC ECHOCARDIOGRAM  10/17/2020   Procedure: TRANSTHORACIC ECHOCARDIOGRAM;  Surgeon: Fay Records, MD;  Location: [place];  Service: Cardiovascular;;  . POLYPECTOMY  10/20/2020   Procedure: POLYPECTOMY;  Surgeon: Irving Copas., MD;  Location: [facility];  Service: Gastroenterology;;  . RADIOFREQUENCY ABLATION  10/20/2020   Procedure: RADIO FREQUENCY ABLATION;  Surgeon: Irving Copas., MD;  Location: [facility];  Service: Gastroenterology;;  . RECONSTRUCTION OF NOSE    . TRACHEOSTOMY    . TRACHEOSTOMY CLOSURE      SOCIAL HISTORY: Social History   Socioeconomic History  . Marital status: Married    Spouse name: Not on file  . Number of children: 0  . Years of education: Not on file  . Highest education level: Not on file  Occupational History  . Occupation: part time courier  Tobacco Use  . Smoking status: Current Every Day Smoker    Types: Cigarettes  . Smokeless tobacco: Never Used  . Tobacco comment: smokes 6 cigarettes per day 11/20/20  Vaping Use  . Vaping Use: Never used  Substance and Sexual Activity  . Alcohol use: Not Currently  . Drug use: Never  . Sexual activity:  Not on file  Other Topics Concern  . Not on file  Social History Narrative  . Not on file   Social Determinants of Health   Financial Resource Strain: Not on file  Food Insecurity: Not on file  Transportation Needs: Not on file  Physical Activity: Not on file  Stress: Not on file  Social Connections: Not on file   Intimate Partner Violence: Not on file    FAMILY HISTORY: Family History  Problem Relation Age of Onset  . GER disease Mother   . Heart disease Mother   . Diabetes Father   . Hypertension Sister   . Hypertension Brother   . Prostate cancer Maternal Grandfather   . Hypertension Brother   . Hypertension Brother   . Hypertension Brother   . Colon cancer Neg Hx   . Esophageal cancer Neg Hx   . Kidney disease Neg Hx   . Liver disease Neg Hx   . Pancreatic cancer Neg Hx   . Rectal cancer Neg Hx   . Stomach cancer Neg Hx     ALLERGIES:  has No Known Allergies.  MEDICATIONS:  Current Outpatient Medications  Medication Sig Dispense Refill  . acetaminophen (TYLENOL) 500 MG tablet Take 500-1,000 mg by mouth every 8 (eight) hours as needed for mild pain or headache.    Marland Kitchen atorvastatin (LIPITOR) 10 MG tablet Take 10 mg by mouth at bedtime.    Marland Kitchen BIOTIN PO Take 1 tablet by mouth daily with breakfast.    . Calcium Carb-Cholecalciferol (CALCIUM+D3 PO) Take 1 tablet by mouth daily after breakfast.    . Cholecalciferol (VITAMIN D3 PO) Take 1 tablet by mouth daily.    . ferrous sulfate 325 (65 FE) MG tablet Take 1 tablet (325 mg total) by mouth 2 (two) times daily with a meal. 60 tablet 3  . furosemide (LASIX) 40 MG tablet Take 1 tablet (40 mg total) by mouth 2 (two) times daily. 60 tablet 3  . gabapentin (NEURONTIN) 400 MG capsule Take 400-800 mg by mouth See admin instructions. Take 400 mg by mouth in the morning and 800 mg at bedtime    . Omega-3 Fatty Acids (FISH OIL PO) Take 1 capsule by mouth daily with breakfast.    . omeprazole (PRILOSEC) 40 MG capsule Take 1 capsule (40 mg total) by mouth 2 (two) times daily before a meal. 60 capsule 6  . sucralfate (CARAFATE) 1 g tablet Take 1 tablet (1 g total) by mouth 4 (four) times daily -  with meals and at bedtime. 120 tablet 0  . sucralfate (CARAFATE) 1 GM/10ML suspension Take 10 mLs (1 g total) by mouth 4 (four) times daily -  with meals and at  bedtime. 420 mL 2  . ursodiol (ACTIGALL) 300 MG capsule Take 2 capsules (600 mg total) by mouth 2 (two) times daily before a meal. 120 capsule 6  . VITAMIN E PO Take 1 capsule by mouth daily with breakfast.     No current facility-administered medications for this visit.    REVIEW OF SYSTEMS:   Constitutional: ( - ) fevers, ( - )  chills , ( - ) night sweats Eyes: ( - ) blurriness of vision, ( - ) double vision, ( - ) watery eyes Ears, nose, mouth, throat, and face: ( - ) mucositis, ( - ) sore throat Respiratory: ( - ) cough, ( - ) dyspnea, ( - ) wheezes Cardiovascular: ( - ) palpitation, ( - ) chest discomfort, ( - )  lower extremity swelling Gastrointestinal:  ( - ) nausea, ( - ) heartburn, ( - ) change in bowel habits Skin: ( - ) abnormal skin rashes Lymphatics: ( - ) new lymphadenopathy, ( - ) easy bruising Neurological: ( - ) numbness, ( - ) tingling, ( - ) new weaknesses Behavioral/Psych: ( - ) mood change, ( - ) new changes  All other systems were reviewed with the patient and are negative.  PHYSICAL EXAMINATION: ECOG PERFORMANCE STATUS: 1 - Symptomatic but completely ambulatory  Vitals:   12/04/20 1443  BP: 128/63  Pulse: 99  Resp: 18  Temp: 99.3 F (37.4 C)  SpO2: 94%   Filed Weights   12/04/20 1443  Weight: 195 lb 3.2 oz (88.5 kg)    GENERAL: well appearing  alert, no distress and comfortable SKIN: skin color, texture, turgor are normal, no rashes or significant lesions EYES: conjunctiva are pink and non-injected, sclera clear LUNGS: clear to auscultation and percussion with normal breathing effort HEART: regular rate & rhythm and no murmurs and no lower extremity edema Musculoskeletal: no cyanosis of digits and no clubbing  PSYCH: alert & oriented x 3, fluent speech NEURO: no focal motor/sensory deficits  LABORATORY DATA:  I have reviewed the data as listed CBC Latest Ref Rng & Units 12/01/2020 09/30/2020 08/31/2020  WBC 4.0 - 10.5 K/uL 8.3 8.3 8.7  Hemoglobin  12.0 - 15.0 g/dL 13.1 11.5(L) 8.1(L)  Hematocrit 36.0 - 46.0 % 38.4 35.5(L) 28.6(L)  Platelets 150.0 - 400.0 K/uL 156.0 137.0(L) 169    CMP Latest Ref Rng & Units 09/30/2020 08/31/2020 08/30/2020  Glucose 70 - 99 mg/dL 102(H) 102(H) 122(H)  BUN 6 - 23 mg/dL 5(L) 9 12  Creatinine 0.40 - 1.20 mg/dL 0.71 0.75 0.67  Sodium 135 - 145 mEq/L 139 135 133(L)  Potassium 3.5 - 5.1 mEq/L 3.4(L) 3.6 3.8  Chloride 96 - 112 mEq/L 96 95(L) 97(L)  CO2 19 - 32 mEq/L 35(H) 31 27  Calcium 8.4 - 10.5 mg/dL 9.4 8.3(L) 8.5(L)  Total Protein 6.0 - 8.3 g/dL 7.2 - 5.9(L)  Total Bilirubin 0.2 - 1.2 mg/dL 0.6 - 0.9  Alkaline Phos 39 - 117 U/L 135(H) - 115  AST 0 - 37 U/L 41(H) - 31  ALT 0 - 35 U/L 31 - 22    RADIOGRAPHIC STUDIES: No results found.  ASSESSMENT & PLAN Sierra Logan 65 y.o. female with medical history significant for splenic infarct and iron deficiency anemia who presents for a follow up visit.   After review the labs, the records, schedule the patient the findings most consistent with a splenic infarct of unclear etiology and iron deficiency anemia.  The most likely etiology of the patient's iron deficiency anemia is GI bleeding.  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   The patient has responded markedly well to PO iron therapy. Her Hgb has risen to normal levels but her iron stores remain low. The most likely reason for this is her body's rapid use of the ingested iron. With continued PO therapy we anticipate her iron stores will improve.   In regards to her splenic infarct the etiology is unclear but may be secondary to the splenomegaly due to cirrhosis.  We performed a hypercoagulable work-up which was negative and therefore there is not any specific interventions required.  I would recommend continual symptom management.  Patient voiced understanding of this plan moving forward.  # Splenic Infarct   --  hypercoagulable workup was negative, no evidence of a hypercoagulable disease --recommend best supportive care at this time  # Iron Deficiency Anemia of Unclear Etiology #Esophageal Varices in the Setting of Cirrhosis --most likely 2/2 to GI bleed, potentially oozing from the varices noted during prior GI workup --continue PO ferrous sulfate 325mg  daily with a source of vitamin C --patient currently connected with [facility] for management of her cirrhosis.  --last iron studies show Ferritin 27.2, Sat 10.8%. This is a modest improvement from prior -- Hgb improved robustly to 13.1 from 8.1 at prior visit. --RTC in 3 months.   No orders of the defined types were placed in this encounter.   All questions were answered. The patient knows to call the clinic with any problems, questions or concerns.  A total of more than 30 minutes were spent on this encounter and over half of that time was spent on counseling and coordination of care as outlined above.   Ledell Peoples, MD Department of Hematology/Oncology [place] [facility] Phone: [phone] Pager: [phone] Email: [email or web address]  12/04/2020 5:23 PM

## 2020-12-07 ENCOUNTER — Other Ambulatory Visit: Payer: Self-pay | Admitting: Gastroenterology

## 2020-12-08 ENCOUNTER — Telehealth: Payer: Self-pay | Admitting: Hematology and Oncology

## 2020-12-08 NOTE — Telephone Encounter (Signed)
Scheduled per los. Called and left msg. Mailed printout  °

## 2020-12-10 ENCOUNTER — Telehealth: Payer: Self-pay | Admitting: Adult Health

## 2020-12-10 ENCOUNTER — Ambulatory Visit (HOSPITAL_COMMUNITY): Payer: 59 | Admitting: Physician Assistant

## 2020-12-10 ENCOUNTER — Other Ambulatory Visit: Payer: Self-pay | Admitting: Gastroenterology

## 2020-12-10 NOTE — Anesthesia Preprocedure Evaluation (Deleted)
Anesthesia Evaluation  Patient identified by MRN, date of birth, ID band Patient awake    Reviewed: Allergy & Precautions, H&P , NPO status , Patient's Chart, lab work & pertinent test results  Airway Mallampati: III  TM Distance: >3 FB Neck ROM: Full    Dental no notable dental hx. (+) Dental Advisory Given, Teeth Intact   Pulmonary Current Smoker and Patient abstained from smoking.,    Pulmonary exam normal breath sounds clear to auscultation       Cardiovascular hypertension,  Rhythm:Regular Rate:Normal     Neuro/Psych negative neurological ROS  negative psych ROS   GI/Hepatic negative GI ROS, (+) Cirrhosis       , Hepatitis -  Endo/Other  Morbid obesity  Renal/GU negative Renal ROS  negative genitourinary   Musculoskeletal   Abdominal   Peds  Hematology  (+) Blood dyscrasia, anemia ,   Anesthesia Other Findings   Reproductive/Obstetrics negative OB ROS                            Anesthesia Physical Anesthesia Plan  ASA: III  Anesthesia Plan: MAC   Post-op Pain Management:    Induction: Intravenous  PONV Risk Score and Plan: 1 and Propofol infusion  Airway Management Planned: Nasal Cannula  Additional Equipment:   Intra-op Plan:   Post-operative Plan:   Informed Consent: I have reviewed the patients History and Physical, chart, labs and discussed the procedure including the risks, benefits and alternatives for the proposed anesthesia with the patient or authorized representative who has indicated his/her understanding and acceptance.     Dental advisory given  Plan Discussed with: CRNA  Anesthesia Plan Comments: (PAT note written 12/10/2020 by Myra Gianotti, PA-C. Case cancelled due to hypokalemia. )      Anesthesia Quick Evaluation

## 2020-12-10 NOTE — Telephone Encounter (Signed)
Obtain ONO report please

## 2020-12-10 NOTE — Telephone Encounter (Signed)
Pt called today and is requesting that an order be sent over to [employer] so that she can turn in her oxygen.  Pt stated that she is not using this now and needs to get the tanks returned.  RA please advise. Thanks

## 2020-12-10 NOTE — Progress Notes (Signed)
After several attempts, unable to reach patient via phone.  Left a detailed message on machine with instructions for DOS.  PCP - Adrienne Mocha, PA-C Cardiologist - n/a  CT Chest x-ray - 08/28/20 EKG - 08/27/20 Stress Test - n/a ECHO - 12/02/20 Cardiac Cath - n/a  Anesthesia review: Yes  STOP now taking any Aspirin (unless otherwise instructed by your surgeon), Aleve, Naproxen, Ibuprofen, Motrin, Advil, Goody's, BC's, all herbal medications, fish oil, and all vitamins.   Coronavirus Screening - assess on DOS.

## 2020-12-10 NOTE — Progress Notes (Signed)
Anesthesia Chart Review: Sierra Logan   Case: [id number] Date/Time: 12/11/20 0915   Procedures:      ESOPHAGOGASTRODUODENOSCOPY (EGD) (N/A )     GI RADIOFREQUENCY ABLATION (N/A )   Anesthesia type: Monitor Anesthesia Care   Pre-op diagnosis: Cirrhosis, PBCholangitis, Acute Diarrhea   Location: [facility]   Surgeons: Mansouraty, Telford Nab., MD      DISCUSSION: Patient is a 65 year old female scheduled for the above procedure. S/p EGD 10/20/20 with GAVE with bleeding s/p radiofrequency ablation  History includes smoking, HLD, neuropathy, HTN, splenic infarct (08/2020), cirrhosis (attributed to primary biliary cholangitis; grade II esophageal varices & GAVE with bleeding s/p radiofrequency ablation 10/20/20), LE edema, prior tracheostomy (decannulated), obesity.  - [facility] Admission 08/28/20-08/31/20 for LUQ pain radiating to left shoulder, worse with movement and deep breathing. CT imaging showed splenic infarct versus mass. Also reported worsening dyspnea for past year. CTA negative for PE but incidental finding of RLL lung nodule with repeat chest CT in 6-12 months recommended. CT Required O2 3L for ambulation and home O2 arranged. Hematology consulted for probable splenic infarct with differential including "hypercoagulable states, thromboembolic disorders, infectious etiologies, and hematological malignancies" and often no cause is found. Hypercoagulable work-up recommended along with supportive care and hydration. Out-patient follow-up arrianged.   Last HEM-ONC follow-up with Dr. Lorenso Courier was on 12/04/20 for follow-up IDA and splenic infarct. Anemia felt likely due to GI bleed in setting of varices/cirrhosis with good results from iron and ongoing GI evaluateion. In regards to splenic infarct, he wrote, "the etiology is unclear but may be secondary to the splenomegaly due to cirrhosis.  We performed a hypercoagulable work-up which was negative and therefore there is not any  specific interventions required.  I would recommend continual symptom management."  Following February 2022 admission, she was referred to GI [name]. (Previous GI Dr. Earlean Shawl with [facility], but she requested transfer due to insurance purposes.) By notes, cirrhosis attributed to primary biliary cholangitis that had been treated ursodiol for years. First evaluated by [name] on 09/30/20. He arranged for EGD and colonoscopy (done 10/20/20, see below OTHER). He also referred her to pulmonology and ordered an echo with bubble study to evaluate for hepatopulmonary syndrome. Per cardiologist Dorris Carnes, MD (who did not see patient, but reviewed findings), "No obvious intracardiac shunt on TTE.Marland KitchenMarland KitchenWould need TEE to full exclude   Defer to PCP". [name] said he would hold off on repeat TTE or TEE with bubble study unless she is unable to come off oxygen in the coming months.    Last pulmonology evaluation with Dr. Elsworth Soho on 11/20/20. He notes, on ambulation O2 dropped from 97->94%. PFTs ordered "to assess for COPD this is the most likely cause of her transient hypoxia which seems to have improved." Nocturnal oximetry also planned for evaluate for nocturnal desaturation. Follow-up chest CT planned in six months.   Anesthesia team to evaluate on the day of procedure.    VS:  BP Readings from Last 3 Encounters:  12/04/20 128/63  12/02/20 120/60  11/20/20 122/68   Pulse Readings from Last 3 Encounters:  12/04/20 99  12/02/20 100  11/20/20 95    PROVIDERS: Hoyt Koch, MD is PCP  Sheran Spine, MD is HEM-ONC Mansouraty, Valarie Merino, MD is GI Kara Mead, MD is pulmonology   LABS: Day of procedure as indicated. As of 12/01/20, H/H 13.1/38.4, PLT 156K. Cr 0.71, AST 41, ALT 31, PT 13.3 and INR 1.2  on 09/30/20.    OTHER: Colonoscopy 10/20/20: Impression: - Preparation of the colon was fair even after copious lavage. - Perianal erythema found on perianal exam. Hemorrhoids found on  digital rectal exam. - One 6 mm polyp in the transverse colon, removed with a cold snare. Resected and retrieved. - Stool in the entire examined colon. - Diverticulosis in the recto-sigmoid colon, in the sigmoid colon and in the descending colon. - Non-bleeding non-thrombosed external and internal hemorrhoids. (Tranverse polyp + tubular adenoma, negative for high grade dysplasia or malignancy. Repeat colonoscopy in 1 year recommended due to poor bowel preparation.)  EGD 10/20/20: Impression: - No gross lesions in esophagus proximally. Grade II esophageal varices distally. - Z-line regular, 40 cm from the incisors. - Erythematous mucosa in the cardia. Portal hypertensive gastropathy throughout. - Gastric antral vascular ectasia with bleeding. Treated with radiofrequency ablation. - Biopsies were taken with a cold forceps for histology and Helicobacter pylori testing. [pathology: gastric antral mucosa with marked nonspecific reactive gastropathy suggestive of adjacent erosion, nonspecific histopathologic changed, negative for H. Pylori] - Nodular mucosa in the duodenal bulb and in the second portion of the duodenum. Biopsied. [pathology: Polypoid duodenal mucosa showing chronic duodenitis with extensive surface gastric foveolar metaplasia, negative for dysplasia or malignancy]  Recommendations included:  - Initiate Omeprazole 40 mg twice daily - Start Carafate 1 g with each meal and at bedtime  - Repeat upper endoscopy in 6-8 weeks for retreatment of any remaining GAVE. If GAVE persists after 3-4 treatments, then will need to consider TIPS. - If BP allows, then would like to initiate Carvedilol or Nadolol or Propanolol.   IMAGES: CTA Chest/Abd/Pelvis 08/28/20: IMPRESSION: 1. No acute intrathoracic pathology. No CT evidence of central pulmonary artery embolus. 2. Cirrhosis. 3. A 3.4 x 2.7 cm ill-defined low attenuating area in the anterior spleen may represent a mass or a focal area of  infarct. 4. There is a 6 mm nodule in the superior segment of the right lower lobe. Non-contrast chest CT at 6-12 months is recommended. If the nodule is stable at time of repeat CT, then future CT at 18-24 months (from today's scan) is considered optional for low-risk patients, but is recommended for high-risk patients. This recommendation follows the consensus statement: Guidelines for Management of Incidental Pulmonary Nodules Detected on CT Images: From the Fleischner Society 2017; Radiology 2017; 284:228-243. 5. Aortic Atherosclerosis (ICD10-I70.0).   EKG: 08/28/20: Normal sinus rhythm Nonspecific ST abnormality Confirmed by Lajean Saver [phone]) on 08/28/2020 5:02:46 PM   CV: Echo (limited Bubble Study) 10/17/20: IMPRESSIONS  1. Left ventricular ejection fraction, by estimation, is 65 to 70%. The  left ventricle has normal function.  2. Right ventricular systolic function was not well visualized.  3. The mitral valve was not well visualized. No evidence of mitral valve  regurgitation. Moderate mitral annular calcification.  4. The aortic valve was not well visualized.  - Conclusion(s)/Recommendation(s): No intracardiac source of embolism  detected on this transthoracic study. A transesophageal echocardiogram is  recommended to exclude cardiac source of embolism if clinically indicated.    BLE Venous US 08/31/20: Summary:  BILATERAL:  - No evidence of deep vein thrombosis seen in the lower extremities,  bilaterally.    Past Medical History:  Diagnosis Date  . Cirrhosis ([place])   . Hyperlipemia   . Hypertension   . Neuropathy   . Spleen disease     Past Surgical History:  Procedure Laterality Date  . BIOPSY  10/20/2020   Procedure: BIOPSY;  Surgeon: Irving Copas., MD;  Location: [facility];  Service: Gastroenterology;;  . COLONOSCOPY WITH PROPOFOL N/A 10/20/2020   Procedure: COLONOSCOPY WITH PROPOFOL;  Surgeon: Irving Copas., MD;  Location: [facility];  Service: Gastroenterology;  Laterality: N/A;  . ESOPHAGOGASTRODUODENOSCOPY (EGD) WITH PROPOFOL N/A 10/20/2020   Procedure: ESOPHAGOGASTRODUODENOSCOPY (EGD) WITH PROPOFOL;  Surgeon: Rush Landmark Telford Nab., MD;  Location: [facility];  Service: Gastroenterology;  Laterality: N/A;  . INTRAOPERATIVE TRANSTHORACIC ECHOCARDIOGRAM  10/17/2020   Procedure: TRANSTHORACIC ECHOCARDIOGRAM;  Surgeon: Fay Records, MD;  Location: [facility];  Service: Cardiovascular;;  . POLYPECTOMY  10/20/2020   Procedure: POLYPECTOMY;  Surgeon: Irving Copas., MD;  Location: [facility];  Service: Gastroenterology;;  . RADIOFREQUENCY ABLATION  10/20/2020   Procedure: RADIO FREQUENCY ABLATION;  Surgeon: Irving Copas., MD;  Location: [facility];  Service: Gastroenterology;;  . RECONSTRUCTION OF NOSE    . TRACHEOSTOMY    . TRACHEOSTOMY CLOSURE      MEDICATIONS: No current facility-administered medications for this encounter.   Marland Kitchen acetaminophen (TYLENOL) 500 MG tablet  . atorvastatin (LIPITOR) 10 MG tablet  . BIOTIN PO  . Calcium Carb-Cholecalciferol (CALCIUM+D3 PO)  . ferrous sulfate 325 (65 FE) MG tablet  . furosemide (LASIX) 40 MG tablet  . gabapentin (NEURONTIN) 400 MG capsule  . nicotine (NICODERM CQ - DOSED IN MG/24 HOURS) 21 mg/24hr patch  . Omega-3 Fatty Acids (FISH OIL PO)  . omeprazole (PRILOSEC) 40 MG capsule  . sucralfate (CARAFATE) 1 GM/10ML suspension  . ursodiol (ACTIGALL) 300 MG capsule  . VITAMIN E PO  . sucralfate (CARAFATE) 1 g tablet    Myra Gianotti, PA-C Surgical Short Stay/Anesthesiology [facility] Phone [phone] [facility] Phone [phone] 12/10/2020 5:41 PM

## 2020-12-11 ENCOUNTER — Encounter (HOSPITAL_COMMUNITY)
Admission: RE | Disposition: A | Discharge status: Home / Self Care | Payer: Self-pay | Source: Home / Self Care | Attending: Emergency Medicine

## 2020-12-11 ENCOUNTER — Ambulatory Visit (HOSPITAL_COMMUNITY)
Admission: RE | Admit: 2020-12-11 | Discharge: 2020-12-11 | Disposition: A | Discharge status: Home / Self Care | Payer: 59 | Attending: Emergency Medicine | Admitting: Emergency Medicine

## 2020-12-11 ENCOUNTER — Encounter (HOSPITAL_COMMUNITY): Payer: Self-pay | Admitting: Gastroenterology

## 2020-12-11 ENCOUNTER — Telehealth: Payer: Self-pay

## 2020-12-11 ENCOUNTER — Other Ambulatory Visit: Payer: Self-pay

## 2020-12-11 DIAGNOSIS — Z86010 Personal history of colonic polyps: Secondary | ICD-10-CM | POA: Insufficient documentation

## 2020-12-11 DIAGNOSIS — Z8639 Personal history of other endocrine, nutritional and metabolic disease: Secondary | ICD-10-CM

## 2020-12-11 DIAGNOSIS — I10 Essential (primary) hypertension: Secondary | ICD-10-CM | POA: Diagnosis not present

## 2020-12-11 DIAGNOSIS — K648 Other hemorrhoids: Secondary | ICD-10-CM | POA: Diagnosis not present

## 2020-12-11 DIAGNOSIS — Z79899 Other long term (current) drug therapy: Secondary | ICD-10-CM | POA: Diagnosis not present

## 2020-12-11 DIAGNOSIS — K31811 Angiodysplasia of stomach and duodenum with bleeding: Secondary | ICD-10-CM | POA: Diagnosis not present

## 2020-12-11 DIAGNOSIS — E876 Hypokalemia: Secondary | ICD-10-CM | POA: Insufficient documentation

## 2020-12-11 DIAGNOSIS — K5730 Diverticulosis of large intestine without perforation or abscess without bleeding: Secondary | ICD-10-CM | POA: Insufficient documentation

## 2020-12-11 DIAGNOSIS — Z8379 Family history of other diseases of the digestive system: Secondary | ICD-10-CM | POA: Insufficient documentation

## 2020-12-11 DIAGNOSIS — R0902 Hypoxemia: Secondary | ICD-10-CM

## 2020-12-11 DIAGNOSIS — Z833 Family history of diabetes mellitus: Secondary | ICD-10-CM | POA: Insufficient documentation

## 2020-12-11 DIAGNOSIS — K743 Primary biliary cirrhosis: Secondary | ICD-10-CM | POA: Insufficient documentation

## 2020-12-11 DIAGNOSIS — I8510 Secondary esophageal varices without bleeding: Secondary | ICD-10-CM | POA: Diagnosis not present

## 2020-12-11 DIAGNOSIS — K644 Residual hemorrhoidal skin tags: Secondary | ICD-10-CM | POA: Diagnosis not present

## 2020-12-11 DIAGNOSIS — F17210 Nicotine dependence, cigarettes, uncomplicated: Secondary | ICD-10-CM | POA: Diagnosis not present

## 2020-12-11 DIAGNOSIS — D509 Iron deficiency anemia, unspecified: Secondary | ICD-10-CM | POA: Diagnosis not present

## 2020-12-11 DIAGNOSIS — K766 Portal hypertension: Secondary | ICD-10-CM | POA: Diagnosis not present

## 2020-12-11 DIAGNOSIS — Z5309 Procedure and treatment not carried out because of other contraindication: Secondary | ICD-10-CM | POA: Insufficient documentation

## 2020-12-11 DIAGNOSIS — Z8042 Family history of malignant neoplasm of prostate: Secondary | ICD-10-CM | POA: Diagnosis not present

## 2020-12-11 DIAGNOSIS — Z8249 Family history of ischemic heart disease and other diseases of the circulatory system: Secondary | ICD-10-CM | POA: Diagnosis not present

## 2020-12-11 LAB — POCT I-STAT, CHEM 8
BUN: 6 mg/dL — ABNORMAL LOW (ref 8–23)
BUN: 6 mg/dL — ABNORMAL LOW (ref 8–23)
Calcium, Ion: 0.99 mmol/L — ABNORMAL LOW (ref 1.15–1.40)
Calcium, Ion: 1.03 mmol/L — ABNORMAL LOW (ref 1.15–1.40)
Chloride: 87 mmol/L — ABNORMAL LOW (ref 98–111)
Chloride: 88 mmol/L — ABNORMAL LOW (ref 98–111)
Creatinine, Ser: 0.7 mg/dL (ref 0.44–1.00)
Creatinine, Ser: 0.7 mg/dL (ref 0.44–1.00)
Glucose, Bld: 135 mg/dL — ABNORMAL HIGH (ref 70–99)
Glucose, Bld: 137 mg/dL — ABNORMAL HIGH (ref 70–99)
HCT: 36 % (ref 36.0–46.0)
HCT: 36 % (ref 36.0–46.0)
Hemoglobin: 12.2 g/dL (ref 12.0–15.0)
Hemoglobin: 12.2 g/dL (ref 12.0–15.0)
Potassium: 2.1 mmol/L — CL (ref 3.5–5.1)
Potassium: 2.3 mmol/L — CL (ref 3.5–5.1)
Sodium: 135 mmol/L (ref 135–145)
Sodium: 136 mmol/L (ref 135–145)
TCO2: 36 mmol/L — ABNORMAL HIGH (ref 22–32)
TCO2: 38 mmol/L — ABNORMAL HIGH (ref 22–32)

## 2020-12-11 LAB — CBC WITH DIFFERENTIAL/PLATELET
Abs Immature Granulocytes: 0.02 10*3/uL (ref 0.00–0.07)
Basophils Absolute: 0.1 10*3/uL (ref 0.0–0.1)
Basophils Relative: 1 %
Eosinophils Absolute: 0 10*3/uL (ref 0.0–0.5)
Eosinophils Relative: 0 %
HCT: 38.9 % (ref 36.0–46.0)
Hemoglobin: 12.6 g/dL (ref 12.0–15.0)
Immature Granulocytes: 0 %
Lymphocytes Relative: 27 %
Lymphs Abs: 2 10*3/uL (ref 0.7–4.0)
MCH: 29.9 pg (ref 26.0–34.0)
MCHC: 32.4 g/dL (ref 30.0–36.0)
MCV: 92.4 fL (ref 80.0–100.0)
Monocytes Absolute: 0.6 10*3/uL (ref 0.1–1.0)
Monocytes Relative: 8 %
Neutro Abs: 4.8 10*3/uL (ref 1.7–7.7)
Neutrophils Relative %: 64 %
Platelets: 145 10*3/uL — ABNORMAL LOW (ref 150–400)
RBC: 4.21 MIL/uL (ref 3.87–5.11)
RDW: 15.1 % (ref 11.5–15.5)
WBC: 7.4 10*3/uL (ref 4.0–10.5)
nRBC: 0 % (ref 0.0–0.2)

## 2020-12-11 LAB — BASIC METABOLIC PANEL
Anion gap: 12 (ref 5–15)
BUN: 7 mg/dL — ABNORMAL LOW (ref 8–23)
CO2: 35 mmol/L — ABNORMAL HIGH (ref 22–32)
Calcium: 8.5 mg/dL — ABNORMAL LOW (ref 8.9–10.3)
Chloride: 88 mmol/L — ABNORMAL LOW (ref 98–111)
Creatinine, Ser: 0.69 mg/dL (ref 0.44–1.00)
GFR, Estimated: >60 mL/min (ref >60)
Glucose, Bld: 121 mg/dL — ABNORMAL HIGH (ref 70–99)
Potassium: 2.5 mmol/L — CL (ref 3.5–5.1)
Sodium: 135 mmol/L (ref 135–145)

## 2020-12-11 LAB — MAGNESIUM: Magnesium: 1.8 mg/dL (ref 1.7–2.4)

## 2020-12-11 SURGERY — CANCELLED PROCEDURE
Anesthesia: Monitor Anesthesia Care

## 2020-12-11 MED ORDER — SUCRALFATE 1 GM/10ML PO SUSP: 1.0000 g | Freq: Two times a day (BID) | ORAL | 2 refills | Status: DC

## 2020-12-11 MED ORDER — POTASSIUM CHLORIDE CRYS ER 20 MEQ PO TBCR
40.0000 meq | EXTENDED_RELEASE_TABLET | ORAL | Status: AC
  Administered 2020-12-11: 40 meq via ORAL
  Filled 2020-12-11: qty 2

## 2020-12-11 MED ORDER — POTASSIUM CHLORIDE CRYS ER 20 MEQ PO TBCR
40.0000 meq | EXTENDED_RELEASE_TABLET | Freq: Once | ORAL | Status: AC
  Administered 2020-12-11: 40 meq via ORAL
  Filled 2020-12-11: qty 2

## 2020-12-11 MED ORDER — POTASSIUM CHLORIDE CRYS ER 20 MEQ PO TBCR: 40.0000 meq | EXTENDED_RELEASE_TABLET | Freq: Every day | ORAL | 0 refills | Status: DC

## 2020-12-11 MED ORDER — POTASSIUM CHLORIDE 10 MEQ/100ML IV SOLN
10.0000 meq | Freq: Once | INTRAVENOUS | Status: AC
  Administered 2020-12-11: 10 meq via INTRAVENOUS
  Filled 2020-12-11: qty 100

## 2020-12-11 MED ORDER — FUROSEMIDE 40 MG PO TABS: 40.0000 mg | ORAL_TABLET | Freq: Every day | ORAL | 3 refills | Status: DC

## 2020-12-11 MED ORDER — SODIUM CHLORIDE 0.9 % IV SOLN: INTRAVENOUS | Status: DC

## 2020-12-11 MED ORDER — SPIRONOLACTONE 25 MG PO TABS: 25.0000 mg | ORAL_TABLET | Freq: Every day | ORAL | 11 refills | Status: DC

## 2020-12-11 MED ORDER — POTASSIUM CHLORIDE 10 MEQ/100ML IV SOLN: 10.0000 meq | INTRAVENOUS | Status: DC

## 2020-12-11 NOTE — Telephone Encounter (Signed)
ATC, no answer, left VM. Patient is currently in ED. Will try again later

## 2020-12-11 NOTE — Progress Notes (Signed)
Patient received 40 mEQ KCl PO and transferred to ED for additional tx for Hypokalemia. See Dr.Mansourati note.Dr.Mansourati spoke to patient and explained POC. Patient verbalized understanding

## 2020-12-11 NOTE — Discharge Instructions (Signed)
Start taking the potassium supplements prescribed to you. Decreased her Lasix from twice daily to once daily. Start taking the spironolactone prescribed to you. Follow-up with your normal doctor and nephrology. Follow-up with gastroenterology to reschedule scope.

## 2020-12-11 NOTE — ED Triage Notes (Signed)
Pt arrives from endoscopy lab. Was about to have upper endoscopy and labs showed low potassium of 2.1. Pt denies pain, weakness, SOB or CP. VSS. NAD at present.

## 2020-12-11 NOTE — ED Provider Notes (Signed)
[facility] Provider Note   CSN: [id number] Arrival date & time: 12/11/20  1013     History Chief Complaint  Patient presents with  . Abnormal Lab    Sierra Logan is a 65 y.o. female.  HPI 65 year old female with history of hypertension, hyperlipidemia, cirrhosis, GAVE, PBC presents emergency department for hypokalemia.  Patient presented to the endoscopy lab today for evaluation of GAVE with gastroenterology.  Preop labs showed potassium of 2.1.  This was repeated and still found to be 2.3.  Patient was given 40 mg of oral potassium.  Additionally, gastroenterologist sent oral potassium replacement and spironolactone prescription to outpatient pharmacy.  He also lowered Lasix from twice daily to once daily.  Patient was sent here for IV potassium and monitoring.  EKG in the preoperative setting showed new acute changes.  Patient does states she has a history of hypokalemia and is supposed to be on oral potassium supplements.  States she was unable to find the potassium she normally takes, so has not been taking it for an unknown period of time.  Nothing is made this better.  States she is in her normal state of health, no acute symptoms or concerns at this time.    Past Medical History:  Diagnosis Date  . Cirrhosis ([place])   . Hyperlipemia   . Hypertension   . Neuropathy   . Spleen disease     Patient Active Problem List   Diagnosis Date Noted  . 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  . Secondary esophageal varices without bleeding ([place]) 12/03/2020  . Aortic atherosclerosis ([place]) 11/20/2020  . Tobacco abuse 11/20/2020  . Concern for possible hepatopulmonary syndrome ([place]) 10/07/2020  . Chronic respiratory failure with hypoxia ([place]) 10/07/2020  . 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 Dark stools 10/07/2020  . Iron deficiency anemia 08/28/2020  . 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 Abdominal pain 08/28/2020  .  Peripheral neuropathy 08/28/2020  . Cirrhosis of liver ([place]) 08/28/2020  . Lung nodule 08/28/2020    Past Surgical History:  Procedure Laterality Date  . BIOPSY  10/20/2020   Procedure: BIOPSY;  Surgeon: Irving Copas., MD;  Location: [facility];  Service: Gastroenterology;;  . COLONOSCOPY WITH PROPOFOL N/A 10/20/2020   Procedure: COLONOSCOPY WITH PROPOFOL;  Surgeon: Irving Copas., MD;  Location: [facility];  Service: Gastroenterology;  Laterality: N/A;  . ESOPHAGOGASTRODUODENOSCOPY (EGD) WITH PROPOFOL N/A 10/20/2020   Procedure: ESOPHAGOGASTRODUODENOSCOPY (EGD) WITH PROPOFOL;  Surgeon: Rush Landmark Telford Nab., MD;  Location: [facility];  Service: Gastroenterology;  Laterality: N/A;  . INTRAOPERATIVE TRANSTHORACIC ECHOCARDIOGRAM  10/17/2020   Procedure: TRANSTHORACIC ECHOCARDIOGRAM;  Surgeon: Fay Records, MD;  Location: [facility];  Service: Cardiovascular;;  . POLYPECTOMY  10/20/2020   Procedure: POLYPECTOMY;  Surgeon: Irving Copas., MD;  Location: [facility];  Service: Gastroenterology;;  . RADIOFREQUENCY ABLATION  10/20/2020   Procedure: RADIO FREQUENCY ABLATION;  Surgeon: Irving Copas., MD;  Location: [facility];  Service: Gastroenterology;;  . RECONSTRUCTION OF NOSE    . TRACHEOSTOMY    . TRACHEOSTOMY CLOSURE       OB History   No obstetric history on file.     Family History  Problem Relation Age of Onset  . GER disease Mother   . Heart disease Mother   . Diabetes Father   . Hypertension Sister   . Hypertension Brother   . Prostate cancer Maternal Grandfather   . Hypertension Brother   . Hypertension Brother   .  Hypertension Brother   . Colon cancer Neg Hx   . Esophageal cancer Neg Hx   . Kidney disease Neg Hx   . Liver disease Neg Hx   . Pancreatic cancer Neg Hx   . Rectal cancer Neg Hx   . Stomach cancer Neg Hx     Social History   Tobacco Use  . Smoking status: Current Every Day Smoker    Types: Cigarettes  .  Smokeless tobacco: Never Used  . Tobacco comment: smokes 6 cigarettes per day 11/20/20  Vaping Use  . Vaping Use: Never used  Substance Use Topics  . Alcohol use: Not Currently  . Drug use: Never    Home Medications Prior to Admission medications   Medication Sig Start Date End Date Taking? Authorizing Provider  acetaminophen (TYLENOL) 500 MG tablet Take 1,000 mg by mouth every 8 (eight) hours as needed for moderate pain or headache.   Yes [provider]  atorvastatin (LIPITOR) 10 MG tablet Take 10 mg by mouth at bedtime. 07/16/20  Yes [provider]  BIOTIN PO Take 1 tablet by mouth daily with breakfast.   Yes [provider]  Calcium Carb-Cholecalciferol (CALCIUM+D3 PO) Take 1 tablet by mouth daily after breakfast.   Yes [provider]  ferrous sulfate 325 (65 FE) MG tablet Take 1 tablet (325 mg total) by mouth 2 (two) times daily with a meal. Patient taking differently: Take 325 mg by mouth daily. 08/31/20  Yes Rai, Ripudeep K, MD  gabapentin (NEURONTIN) 400 MG capsule Take 400-800 mg by mouth See admin instructions. Take 400 mg by mouth in the morning and 800 mg at bedtime 06/16/20  Yes [provider]  nicotine (NICODERM CQ - DOSED IN MG/24 HOURS) 21 mg/24hr patch Place 21 mg onto the skin daily.   Yes [provider]  Omega-3 Fatty Acids (FISH OIL PO) Take 1 capsule by mouth daily with breakfast.   Yes [provider]  omeprazole (PRILOSEC) 40 MG capsule Take 1 capsule (40 mg total) by mouth 2 (two) times daily before a meal. 10/20/20  Yes Mansouraty, Telford Nab., MD  potassium chloride SA (KLOR-CON) 20 MEQ tablet Take 2 tablets (40 mEq total) by mouth daily. 12/11/20  Yes Mansouraty, Telford Nab., MD  spironolactone (ALDACTONE) 25 MG tablet Take 1 tablet (25 mg total) by mouth daily. 12/11/20 12/11/21 Yes Mansouraty, Telford Nab., MD  sucralfate (CARAFATE) 1 g tablet TAKE 1 TABLET(1 GRAM) BY MOUTH FOUR TIMES DAILY AT BEDTIME WITH  MEALS 12/10/20  Yes Mansouraty, Telford Nab., MD  ursodiol (ACTIGALL) 300 MG capsule Take 2 capsules (600 mg total) by mouth 2 (two) times daily before a meal. 10/20/20 05/18/21 Yes Mansouraty, Telford Nab., MD  VITAMIN E PO Take 1 capsule by mouth daily with breakfast.   Yes [provider]  furosemide (LASIX) 40 MG tablet Take 1 tablet (40 mg total) by mouth daily. 12/11/20   Mansouraty, Telford Nab., MD  sucralfate (CARAFATE) 1 GM/10ML suspension Take 10 mLs (1 g total) by mouth 2 (two) times daily. 12/11/20 01/12/21  Mansouraty, Telford Nab., MD    Allergies    Patient has no known allergies.  Review of Systems   Review of Systems  Constitutional: Negative for chills and fever.  HENT: Negative for ear pain and sore throat.   Eyes: Negative for pain and visual disturbance.  Respiratory: Negative for cough and shortness of breath.   Cardiovascular: Negative for chest pain and palpitations.  Gastrointestinal: Negative for abdominal pain  and vomiting.  Genitourinary: Negative for dysuria and hematuria.  Musculoskeletal: Negative for arthralgias and back pain.  Skin: Negative for color change and rash.  Neurological: Negative for seizures and syncope.  All other systems reviewed and are negative.   Physical Exam Updated Vital Signs BP (!) 94/58   Pulse 84   Temp 98.4 F (36.9 C) (Oral)   Resp 14   Ht 5\' 1"  (1.549 m)   Wt 87.1 kg   SpO2 96%   BMI 36.28 kg/m   Physical Exam Vitals and nursing note reviewed.  Constitutional:      General: She is not in acute distress.    Appearance: She is well-developed.  HENT:     Head: Normocephalic and atraumatic.  Eyes:     Extraocular Movements: Extraocular movements intact.     Conjunctiva/sclera: Conjunctivae normal.  Cardiovascular:     Rate and Rhythm: Normal rate and regular rhythm.     Heart sounds: Normal heart sounds. No murmur heard.   Pulmonary:     Effort: Pulmonary effort is normal. No respiratory distress.      Breath sounds: Normal breath sounds.  Abdominal:     Palpations: Abdomen is soft.     Tenderness: There is no guarding.  Musculoskeletal:        General: Normal range of motion.     Cervical back: Neck supple.     Right lower leg: No edema.     Left lower leg: No edema.  Skin:    General: Skin is warm and dry.     Capillary Refill: Capillary refill takes less than 2 seconds.  Neurological:     General: No focal deficit present.     Mental Status: She is alert. Mental status is at baseline.  Psychiatric:        Mood and Affect: Mood normal.        Behavior: Behavior normal.     ED Results / Procedures / Treatments   Labs (all labs ordered are listed, but only abnormal results are displayed) Labs Reviewed  CBC WITH DIFFERENTIAL/PLATELET - Abnormal; Notable for the following components:      Result Value   Platelets 145 (*)    All other components within normal limits  BASIC METABOLIC PANEL - Abnormal; Notable for the following components:   Potassium 2.5 (*)    Chloride 88 (*)    CO2 35 (*)    Glucose, Bld 121 (*)    BUN 7 (*)    Calcium 8.5 (*)    All other components within normal limits  POCT I-STAT, CHEM 8 - Abnormal; Notable for the following components:   Potassium 2.1 (*)    Chloride 87 (*)    BUN 6 (*)    Glucose, Bld 135 (*)    Calcium, Ion 0.99 (*)    TCO2 36 (*)    All other components within normal limits  POCT I-STAT, CHEM 8 - Abnormal; Notable for the following components:   Potassium 2.3 (*)    Chloride 88 (*)    BUN 6 (*)    Glucose, Bld 137 (*)    Calcium, Ion 1.03 (*)    TCO2 38 (*)    All other components within normal limits  MAGNESIUM    EKG EKG Interpretation  Date/Time:  Thursday Dec 11 2020 10:36:04 EDT Ventricular Rate:  95 PR Interval:  142 QRS Duration: 90 QT Interval:  414 QTC Calculation: 520 R Axis:   13 Text Interpretation:  Normal sinus rhythm Anterior infarct , age undetermined Prolonged QT Abnormal ECG Confirmed by  Carmin Muskrat [phone]) on 12/11/2020 11:03:52 AM   Radiology No results found.  Procedures Procedures   Medications Ordered in ED Medications  potassium chloride SA (KLOR-CON) CR tablet 40 mEq (40 mEq Oral Given 12/11/20 0958)  potassium chloride 10 mEq in 100 mL IVPB (0 mEq Intravenous Stopped 12/11/20 1403)  potassium chloride 10 mEq in 100 mL IVPB (10 mEq Intravenous New Bag/Given 12/11/20 1415)  potassium chloride SA (KLOR-CON) CR tablet 40 mEq (40 mEq Oral Given 12/11/20 1415)    ED Course  I have reviewed the triage vital signs and the nursing notes.  Pertinent labs & imaging results that were available during my care of the patient were reviewed by me and considered in my medical decision making (see chart for details).    MDM Rules/Calculators/A&P                         65 year old female presents emergency department for hypokalemia.  Upon evaluation, she is asymptomatic and vital signs are stable.  Not in acute distress  Exam with no acute findings.  Abdomen is soft with no guarding or rebound.  Heart regular rate and rhythm.  No peripheral edema.  She is mentating normally.  Reviewed GI note and i-STAT results.  We will repeat with BMP and CBC for further evaluation.  Patient given oral potassium, will give IV potassium here in the emergency department.  Suspect she is truly hypokalemic as she has not been taking her potassium supplementation as prescribed and is on Lasix.  Thankfully, EKG shows no signs of hyperkalemia.  BMP does show potassium of 2.5.  Given second dose of IV potassium 10 milligrams and 40 mg potassium tablet.  CBC shows stable hemoglobin.  Upon reassessment, patient is still asymptomatic and feels well.  Patient feels comfortable going home.  We discussed symptomatic management and return precautions.  Recommended close PCP follow-up.  She states she will also follow-up with gastroenterology to reschedule procedure.  Plan is to decrease Lasix dose, start  taking oral potassium at home, and start spironolactone.  She voiced understanding and agreement with plan.  Discharged in stable condition.  Final Clinical Impression(s) / ED Diagnoses Final diagnoses:  Hypokalemia    Rx / DC Orders ED Discharge Orders         Ordered    potassium chloride SA (KLOR-CON) 20 MEQ tablet  Daily        12/11/20 0934    spironolactone (ALDACTONE) 25 MG tablet  Daily        12/11/20 0934    furosemide (LASIX) 40 MG tablet  Daily        12/11/20 0934    sucralfate (CARAFATE) 1 GM/10ML suspension  2 times daily        12/11/20 [place], Marielle Mantione, DO 12/11/20 1541    Carmin Muskrat, MD 12/16/20 [phone]

## 2020-12-11 NOTE — Interval H&P Note (Signed)
History and Physical Interval Note:  12/11/2020 8:40 AM  Delana Meyer  has presented today for surgery, with the diagnosis of Cirrhosis, PBCholangitis, Acute Diarrhea.  The various methods of treatment have been discussed with the patient and family. After consideration of risks, benefits and other options for treatment, the patient has consented to  Procedure(s): ESOPHAGOGASTRODUODENOSCOPY (EGD) (N/A) GI RADIOFREQUENCY ABLATION (N/A) as a surgical intervention.  The patient's history has been reviewed, patient examined, no change in status, stable for surgery.  I have reviewed the patient's chart and labs.  Questions were answered to the patient's satisfaction.     [employer]

## 2020-12-11 NOTE — Progress Notes (Addendum)
Unfortunately, the patient was found on preprocedure labs to have hypokalemia down to 2.1. Case discussed with anesthesia and it is felt from a safety perspective that she should not have procedure today which I agree with. Seeing that her hemoglobin had stabilized, we have time to wait on repeat EGD with RFA ablation of GAVE at this time so we will work on rescheduling this.  I am going to adjust the diuretics that she is on and have her PCP updated so that labs can be obtained to ensure that she does not become more significantly hypokalemic.  Today she will receive 10 mEq IV potassium before leaving the unit. She will be given 40 mill equivalents oral potassium before leaving the unit. I am changing her Lasix from twice daily 40 mg to once daily 40 mg. I am initiating her on spironolactone 25 mg daily as a potassium sparing diuretic. I am going to give her 10 days worth of Lasix at a dose of 40 mEq (only 20 mEq potassium pills are available for prescription).  I will have my team try to have the patient come in for labs early next week (Tuesday or Wednesday to see how her potassium is doing.  Ideally patient's potassium will need to be 2.8 or higher prior to any endoscopic evaluation.  I will forward this to her PCP as well so that she will be able to work on her potassium as well in the near future.  After discussion with my endoscopy team, it is felt that due to staffing issues we would be unable to adequately give her IV potassium and monitor her.  As such she will be sent to the emergency department so that she can get her IV potassium and have it rechecked as per emergency department protocol.  I have already sent the prescription for her oral potassium and sent a message to my team as an outpatient to have her have her labs rechecked next week.   Justice Britain, MD [facility] Office # [phone]

## 2020-12-11 NOTE — Telephone Encounter (Signed)
Sierra Logan, Sierra Nab., MD  Timothy Lasso, RN; Hoyt Koch, MD Alencia Gordon,  I had to cancel our procedure today due to the patient's significant hypokalemia.  I have sent a prescription for 40 mEq daily x 10-days.  She should have repeat labs on Tuesday or Wednesday to see and ensure she is doing better overall (I suspect she will).  She may need to go to the ED for discharge due to the hypokalemia, but we will see.   Dr. Thurston Pounds, if you would be able to follow up on this potassium issue in the coming weeks for our patient in case she needs daily supplemental Potassium moving forward.   Thanks GM

## 2020-12-16 ENCOUNTER — Other Ambulatory Visit: Payer: Self-pay

## 2020-12-16 ENCOUNTER — Other Ambulatory Visit (INDEPENDENT_AMBULATORY_CARE_PROVIDER_SITE_OTHER): Payer: 59

## 2020-12-16 DIAGNOSIS — Z8639 Personal history of other endocrine, nutritional and metabolic disease: Secondary | ICD-10-CM | POA: Diagnosis not present

## 2020-12-16 DIAGNOSIS — R0902 Hypoxemia: Secondary | ICD-10-CM | POA: Diagnosis not present

## 2020-12-16 LAB — BASIC METABOLIC PANEL
BUN: 7 mg/dL (ref 6–23)
CO2: 33 mEq/L — ABNORMAL HIGH (ref 19–32)
Calcium: 9.1 mg/dL (ref 8.4–10.5)
Chloride: 97 mEq/L (ref 96–112)
Creatinine, Ser: 0.66 mg/dL (ref 0.40–1.20)
GFR: 92.54 mL/min (ref >60.00)
Glucose, Bld: 122 mg/dL — ABNORMAL HIGH (ref 70–99)
Potassium: 2.9 mEq/L — ABNORMAL LOW (ref 3.5–5.1)
Sodium: 138 mEq/L (ref 135–145)

## 2020-12-16 LAB — I-STAT CHEM 8, ED
BUN: 6 mg/dL — ABNORMAL LOW (ref 8–23)
Calcium, Ion: 1.03 mmol/L — ABNORMAL LOW (ref 1.15–1.40)
Chloride: 88 mmol/L — ABNORMAL LOW (ref 98–111)
Creatinine, Ser: 0.7 mg/dL (ref 0.44–1.00)
Glucose, Bld: 137 mg/dL — ABNORMAL HIGH (ref 70–99)
HCT: 36 % (ref 36.0–46.0)
Hemoglobin: 12.2 g/dL (ref 12.0–15.0)
Potassium: 2.3 mmol/L — CL (ref 3.5–5.1)
Sodium: 135 mmol/L (ref 135–145)
TCO2: 38 mmol/L — ABNORMAL HIGH (ref 22–32)

## 2020-12-16 NOTE — Progress Notes (Signed)
Lab order entered for today. Pt will go to lab this afternoon to have repeat BMET.

## 2020-12-16 NOTE — Telephone Encounter (Signed)
Patient came by office this afternoon. Order placed in epic for repeat BMET today, pt went to basement for labs. Pt states that she did start k+ over the weekend. Pt will be called with results.

## 2020-12-17 ENCOUNTER — Other Ambulatory Visit: Payer: Self-pay

## 2020-12-17 MED ORDER — SPIRONOLACTONE 25 MG PO TABS: 50.0000 mg | ORAL_TABLET | Freq: Every day | ORAL | 0 refills | Status: DC

## 2020-12-17 MED ORDER — POTASSIUM CHLORIDE CRYS ER 20 MEQ PO TBCR
40.0000 meq | EXTENDED_RELEASE_TABLET | Freq: Every day | ORAL | 0 refills | Status: DC
Start: 2020-12-17 — End: 2021-01-16

## 2020-12-19 ENCOUNTER — Other Ambulatory Visit: Payer: Self-pay | Admitting: Gastroenterology

## 2020-12-22 ENCOUNTER — Other Ambulatory Visit: Payer: Self-pay

## 2020-12-23 ENCOUNTER — Encounter: Payer: Self-pay | Admitting: Internal Medicine

## 2020-12-23 ENCOUNTER — Ambulatory Visit: Payer: 59 | Admitting: Internal Medicine

## 2020-12-23 VITALS — BP 124/80 | HR 84 | Temp 98.7°F | Resp 18 | Ht 61.0 in | Wt 196.0 lb

## 2020-12-23 DIAGNOSIS — D500 Iron deficiency anemia secondary to blood loss (chronic): Secondary | ICD-10-CM

## 2020-12-23 DIAGNOSIS — E876 Hypokalemia: Secondary | ICD-10-CM | POA: Diagnosis not present

## 2020-12-23 LAB — COMPREHENSIVE METABOLIC PANEL
ALT: 36 U/L — ABNORMAL HIGH (ref 0–35)
AST: 78 U/L — ABNORMAL HIGH (ref 0–37)
Albumin: 3.5 g/dL (ref 3.5–5.2)
Alkaline Phosphatase: 156 U/L — ABNORMAL HIGH (ref 39–117)
BUN: 7 mg/dL (ref 6–23)
CO2: 31 mEq/L (ref 19–32)
Calcium: 8.5 mg/dL (ref 8.4–10.5)
Chloride: 93 mEq/L — ABNORMAL LOW (ref 96–112)
Creatinine, Ser: 0.85 mg/dL (ref 0.40–1.20)
GFR: 72.26 mL/min (ref >60.00)
Glucose, Bld: 166 mg/dL — ABNORMAL HIGH (ref 70–99)
Potassium: 3.1 mEq/L — ABNORMAL LOW (ref 3.5–5.1)
Sodium: 134 mEq/L — ABNORMAL LOW (ref 135–145)
Total Bilirubin: 0.7 mg/dL (ref 0.2–1.2)
Total Protein: 7.2 g/dL (ref 6.0–8.3)

## 2020-12-23 LAB — CBC
HCT: 36.9 % (ref 36.0–46.0)
Hemoglobin: 12.4 g/dL (ref 12.0–15.0)
MCHC: 33.6 g/dL (ref 30.0–36.0)
MCV: 89.4 fl (ref 78.0–100.0)
Platelets: 150 10*3/uL (ref 150.0–400.0)
RBC: 4.13 Mil/uL (ref 3.87–5.11)
RDW: 16.3 % — ABNORMAL HIGH (ref 11.5–15.5)
WBC: 6.2 10*3/uL (ref 4.0–10.5)

## 2020-12-23 NOTE — Progress Notes (Signed)
   Subjective:   Patient ID: Sierra Logan, female    DOB: [birth year], 65 y.o.   MRN: [id number]  HPI The patient is a 65 YO female coming in for concerns about low potassium level. This did cause cancellation of her EGD with GI. She was sent to ER and given treatment. She is taking 40 mEq K daily and they did add spironolactone and recently increased dose to 50 mg daily (about 1 week ago). They did also reduce her lasix to 40 mg daily (from BID previous to that).   Review of Systems  Constitutional: Negative.   HENT:  Positive for congestion.   Eyes: Negative.   Respiratory:  Negative for cough, chest tightness and shortness of breath.   Cardiovascular:  Negative for chest pain, palpitations and leg swelling.  Gastrointestinal:  Negative for abdominal distention, abdominal pain, constipation, diarrhea, nausea and vomiting.  Musculoskeletal: Negative.   Skin: Negative.   Neurological: Negative.   Psychiatric/Behavioral: Negative.     Objective:  Physical Exam Constitutional:      Appearance: She is well-developed.  HENT:     Head: Normocephalic and atraumatic.  Cardiovascular:     Rate and Rhythm: Normal rate and regular rhythm.  Pulmonary:     Effort: Pulmonary effort is normal. No respiratory distress.     Breath sounds: Normal breath sounds. No wheezing or rales.  Abdominal:     General: Bowel sounds are normal. There is no distension.     Palpations: Abdomen is soft.     Tenderness: There is no abdominal tenderness. There is no rebound.  Musculoskeletal:     Cervical back: Normal range of motion.  Skin:    General: Skin is warm and dry.  Neurological:     Mental Status: She is alert and oriented to person, place, and time.     Coordination: Coordination normal.    Vitals:   12/23/20 1111  BP: 124/80  Pulse: 84  Resp: 18  Temp: 98.7 F (37.1 C)  TempSrc: Oral  SpO2: 94%  Weight: 196 lb (88.9 kg)  Height: 5\' 1"  (1.549 m)    This visit occurred during the  SARS-CoV-2 public health emergency.  Safety protocols were in place, including screening questions prior to the visit, additional usage of staff PPE, and extensive cleaning of exam room while observing appropriate contact time as indicated for disinfecting solutions.   Assessment & Plan:

## 2020-12-23 NOTE — Patient Instructions (Signed)
We will check the blood work today and check the potassium levels.

## 2020-12-24 NOTE — Telephone Encounter (Signed)
Message sent to [facility] and [facility] asking for the ONO results. Per pt's chart an ONO was ordered in May and sent to [facility].

## 2020-12-25 DIAGNOSIS — E876 Hypokalemia: Secondary | ICD-10-CM | POA: Insufficient documentation

## 2020-12-25 NOTE — Assessment & Plan Note (Signed)
Checking potassium level and has switched to spironolactone 50 mg only a few days ago so if >3 will keep the same for now and then reassess in a few weeks.

## 2020-12-25 NOTE — Telephone Encounter (Signed)
ONO results are being faxed to the main fax. Will forward to triage to look out for the result.

## 2020-12-25 NOTE — Telephone Encounter (Signed)
Herbert Deaner, RN; Skeet Latch; Miquel Dunn   I will reach out to the RT team for them to fax. Thank you!

## 2020-12-25 NOTE — Assessment & Plan Note (Signed)
Checking CBC for follow up. No dark stools at present.

## 2020-12-29 ENCOUNTER — Other Ambulatory Visit: Payer: Self-pay | Admitting: Internal Medicine

## 2020-12-29 DIAGNOSIS — E876 Hypokalemia: Secondary | ICD-10-CM

## 2020-12-30 ENCOUNTER — Other Ambulatory Visit: Payer: Self-pay | Admitting: Gastroenterology

## 2021-01-13 ENCOUNTER — Other Ambulatory Visit: Payer: Self-pay

## 2021-01-13 ENCOUNTER — Other Ambulatory Visit (INDEPENDENT_AMBULATORY_CARE_PROVIDER_SITE_OTHER): Payer: 59

## 2021-01-13 DIAGNOSIS — E876 Hypokalemia: Secondary | ICD-10-CM

## 2021-01-13 LAB — COMPREHENSIVE METABOLIC PANEL
ALT: 15 U/L (ref 0–35)
AST: 29 U/L (ref 0–37)
Albumin: 3.7 g/dL (ref 3.5–5.2)
Alkaline Phosphatase: 155 U/L — ABNORMAL HIGH (ref 39–117)
BUN: 6 mg/dL (ref 6–23)
CO2: 34 mEq/L — ABNORMAL HIGH (ref 19–32)
Calcium: 9.2 mg/dL (ref 8.4–10.5)
Chloride: 95 mEq/L — ABNORMAL LOW (ref 96–112)
Creatinine, Ser: 0.8 mg/dL (ref 0.40–1.20)
GFR: 77.69 mL/min (ref >60.00)
Glucose, Bld: 174 mg/dL — ABNORMAL HIGH (ref 70–99)
Potassium: 3.1 mEq/L — ABNORMAL LOW (ref 3.5–5.1)
Sodium: 138 mEq/L (ref 135–145)
Total Bilirubin: 0.5 mg/dL (ref 0.2–1.2)
Total Protein: 7.4 g/dL (ref 6.0–8.3)

## 2021-01-16 ENCOUNTER — Other Ambulatory Visit: Payer: Self-pay | Admitting: Internal Medicine

## 2021-01-16 ENCOUNTER — Other Ambulatory Visit: Payer: Self-pay | Admitting: Gastroenterology

## 2021-01-16 MED ORDER — POTASSIUM CHLORIDE CRYS ER 20 MEQ PO TBCR: 20.0000 meq | EXTENDED_RELEASE_TABLET | Freq: Every day | ORAL | 3 refills | Status: DC

## 2021-02-10 ENCOUNTER — Ambulatory Visit: Payer: 59 | Admitting: Adult Health

## 2021-02-12 ENCOUNTER — Ambulatory Visit: Payer: 59 | Admitting: Adult Health

## 2021-02-16 ENCOUNTER — Ambulatory Visit: Payer: 59 | Admitting: Adult Health

## 2021-02-16 ENCOUNTER — Other Ambulatory Visit: Payer: 59

## 2021-02-17 ENCOUNTER — Ambulatory Visit (INDEPENDENT_AMBULATORY_CARE_PROVIDER_SITE_OTHER)
Admission: RE | Admit: 2021-02-17 | Discharge: 2021-02-17 | Disposition: A | Discharge status: Home / Self Care | Payer: 59 | Source: Ambulatory Visit | Attending: Pulmonary Disease | Admitting: Pulmonary Disease

## 2021-02-17 ENCOUNTER — Other Ambulatory Visit: Payer: Self-pay

## 2021-02-17 DIAGNOSIS — R911 Solitary pulmonary nodule: Secondary | ICD-10-CM | POA: Diagnosis not present

## 2021-02-17 DIAGNOSIS — Z720 Tobacco use: Secondary | ICD-10-CM | POA: Diagnosis not present

## 2021-02-17 DIAGNOSIS — J9611 Chronic respiratory failure with hypoxia: Secondary | ICD-10-CM | POA: Diagnosis not present

## 2021-02-19 ENCOUNTER — Encounter: Payer: Self-pay | Admitting: Hematology and Oncology

## 2021-02-19 ENCOUNTER — Other Ambulatory Visit: Payer: Self-pay

## 2021-02-19 ENCOUNTER — Encounter: Payer: Self-pay | Admitting: Adult Health

## 2021-02-19 ENCOUNTER — Inpatient Hospital Stay: Payer: 59 | Attending: Hematology and Oncology

## 2021-02-19 ENCOUNTER — Ambulatory Visit (INDEPENDENT_AMBULATORY_CARE_PROVIDER_SITE_OTHER): Payer: 59 | Admitting: Adult Health

## 2021-02-19 ENCOUNTER — Other Ambulatory Visit: Payer: Self-pay | Admitting: Hematology and Oncology

## 2021-02-19 ENCOUNTER — Inpatient Hospital Stay (HOSPITAL_BASED_OUTPATIENT_CLINIC_OR_DEPARTMENT_OTHER): Payer: 59 | Admitting: Hematology and Oncology

## 2021-02-19 VITALS — BP 123/63 | HR 100 | Temp 97.5°F | Resp 18 | Wt 193.1 lb

## 2021-02-19 VITALS — BP 120/78 | HR 98 | Temp 98.1°F | Ht 62.0 in | Wt 193.0 lb

## 2021-02-19 DIAGNOSIS — J9611 Chronic respiratory failure with hypoxia: Secondary | ICD-10-CM

## 2021-02-19 DIAGNOSIS — D500 Iron deficiency anemia secondary to blood loss (chronic): Secondary | ICD-10-CM

## 2021-02-19 DIAGNOSIS — Z79899 Other long term (current) drug therapy: Secondary | ICD-10-CM | POA: Insufficient documentation

## 2021-02-19 DIAGNOSIS — F17210 Nicotine dependence, cigarettes, uncomplicated: Secondary | ICD-10-CM

## 2021-02-19 DIAGNOSIS — D509 Iron deficiency anemia, unspecified: Secondary | ICD-10-CM | POA: Diagnosis present

## 2021-02-19 DIAGNOSIS — R161 Splenomegaly, not elsewhere classified: Secondary | ICD-10-CM | POA: Insufficient documentation

## 2021-02-19 DIAGNOSIS — K743 Primary biliary cirrhosis: Secondary | ICD-10-CM | POA: Insufficient documentation

## 2021-02-19 DIAGNOSIS — D735 Infarction of spleen: Secondary | ICD-10-CM

## 2021-02-19 DIAGNOSIS — R911 Solitary pulmonary nodule: Secondary | ICD-10-CM | POA: Diagnosis not present

## 2021-02-19 DIAGNOSIS — Z720 Tobacco use: Secondary | ICD-10-CM | POA: Diagnosis not present

## 2021-02-19 LAB — CMP (CANCER CENTER ONLY)
ALT: 22 U/L (ref 0–44)
AST: 43 U/L — ABNORMAL HIGH (ref 15–41)
Albumin: 3.3 g/dL — ABNORMAL LOW (ref 3.5–5.0)
Alkaline Phosphatase: 152 U/L — ABNORMAL HIGH (ref 38–126)
Anion gap: 12 (ref 5–15)
BUN: 9 mg/dL (ref 8–23)
CO2: 26 mmol/L (ref 22–32)
Calcium: 9.3 mg/dL (ref 8.9–10.3)
Chloride: 101 mmol/L (ref 98–111)
Creatinine: 0.89 mg/dL (ref 0.44–1.00)
GFR, Estimated: >60 mL/min (ref >60)
Glucose, Bld: 208 mg/dL — ABNORMAL HIGH (ref 70–99)
Potassium: 3.8 mmol/L (ref 3.5–5.1)
Sodium: 139 mmol/L (ref 135–145)
Total Bilirubin: 0.5 mg/dL (ref 0.3–1.2)
Total Protein: 7.6 g/dL (ref 6.5–8.1)

## 2021-02-19 LAB — RETIC PANEL
Immature Retic Fract: 19.9 % — ABNORMAL HIGH (ref 2.3–15.9)
RBC.: 4.07 MIL/uL (ref 3.87–5.11)
Retic Count, Absolute: 193.3 10*3/uL — ABNORMAL HIGH (ref 19.0–186.0)
Retic Ct Pct: 4.8 % — ABNORMAL HIGH (ref 0.4–3.1)
Reticulocyte Hemoglobin: 34.5 pg (ref >27.9)

## 2021-02-19 LAB — CBC WITH DIFFERENTIAL (CANCER CENTER ONLY)
Abs Immature Granulocytes: 0.03 10*3/uL (ref 0.00–0.07)
Basophils Absolute: 0 10*3/uL (ref 0.0–0.1)
Basophils Relative: 1 %
Eosinophils Absolute: 0.3 10*3/uL (ref 0.0–0.5)
Eosinophils Relative: 4 %
HCT: 38.6 % (ref 36.0–46.0)
Hemoglobin: 13 g/dL (ref 12.0–15.0)
Immature Granulocytes: 0 %
Lymphocytes Relative: 25 %
Lymphs Abs: 2 10*3/uL (ref 0.7–4.0)
MCH: 32.1 pg (ref 26.0–34.0)
MCHC: 33.7 g/dL (ref 30.0–36.0)
MCV: 95.3 fL (ref 80.0–100.0)
Monocytes Absolute: 0.7 10*3/uL (ref 0.1–1.0)
Monocytes Relative: 9 %
Neutro Abs: 5.1 10*3/uL (ref 1.7–7.7)
Neutrophils Relative %: 61 %
Platelet Count: 141 10*3/uL — ABNORMAL LOW (ref 150–400)
RBC: 4.05 MIL/uL (ref 3.87–5.11)
RDW: 14.9 % (ref 11.5–15.5)
WBC Count: 8.3 10*3/uL (ref 4.0–10.5)
nRBC: 0 % (ref 0.0–0.2)

## 2021-02-19 LAB — IRON AND TIBC
Iron: 82 ug/dL (ref 41–142)
Saturation Ratios: 24 % (ref 21–57)
TIBC: 344 ug/dL (ref 236–444)
UIBC: 262 ug/dL (ref 120–384)

## 2021-02-19 LAB — FERRITIN: Ferritin: 42 ng/mL (ref 11–307)

## 2021-02-19 MED ORDER — SUCRALFATE 1 GM/10ML PO SUSP: ORAL | 2 refills | Status: DC

## 2021-02-19 NOTE — Progress Notes (Signed)
$'@Patient'w$  ID: Sierra Logan, female    DOB: [birth year], 65 y.o.   MRN: [id number]  Chief Complaint  Patient presents with   Follow-up    Referring provider: Hoyt Koch, *  HPI: Is 65 year old female active smoker seen for pulmonary consult Nov 20, 2020 for shortness of breath and chronic hypoxic respiratory failure. Medical history significant for primary biliary cirrhosis, diagnosed on liver biopsy, known portal hypertension with esophageal varices.  Admission February 2022 with splenic infarct EGD and colonoscopy with grade 2 varices, portal gastropathy, G AVE status post RF ablation. During this admission patient was started on oxygen at 2 to 3 L.   TEST/EVENTS :  05/2019 ANA 1-6 40 speckled pattern AMA M2 greater than 3 Alpha-1 antitrypsin normal Anti-smooth muscle antibody negative Anti-LK M1 antibody negative   10/2020 echo >> LVEF nml, RV not well visualised, no PFO on bubble study, moderate mitral annular calcification.   CTA chest 08/2020 >> neg PE ,6 mm nodule in the superior segment of the right lower lobe, Mild enlargement of bilateral hilar lymph nodes measuring up to 13 mm in short axis on the right. Subcarinal lymph node measures approximately 11 mm.  02/19/2021 Follow up : Dyspnea , O2 RF  Patient returns for a 24-monthfollow-up.  Patient has a complex medical history with primary biliary cirrhosis with associated portal hypertension-esophageal varices.  She was admitted to the hospital in February 2022 with abdominal pain.  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  Endoscopy showed grade 2 varices, portal gastropathy.  G AVE status post RF ablation.  She was felt to have a splenic infarct from hypoxia.  She was started on oxygen at 2 to 3 L.  She has weaned herself off of daytime oxygen. O2 saturations today on room air are 96%.  She had an overnight oximetry test which results are pending.  We are contacting the homecare company to get these results.  She was  recommended to have pulmonary function testing to evaluate for possible underlying COPD as she has a long history of smoking. Patient says she has smoked for around 52 years.  On average about a pack a day.  She is currently smoking 1/2 to 1 pack daily.  We did discuss smoking cessation with helpful tips on quitting. Patient was set up for a CT chest completed February 17, 2021 showing a 5 mm right lower lobe nodule, several small right-sided nodules measuring 3 mm or less.  No suspicious pulmonary nodules or masses.  Mild emphysema.  Advanced cirrhosis.  Atherosclerosis and coronary valve calcifications.  We discussed her CT scan results.  We discussed a referral to the low-dose CT lung cancer screening program.  She is in agreement. Since last visit patient says she is doing okay.  She gets winded with heavy activities.  She has a sedentary lifestyle.  She denies any flare of cough or wheezing.  No Known Allergies  Immunization History  Administered Date(s) Administered   Influenza,inj,Quad PF,6-35 Mos 05/02/2019   PFIZER(Purple Top)SARS-COV-2 Vaccination 10/23/2019, 11/20/2019    Past Medical History:  Diagnosis Date   [place]    Hyperlipemia    Hypertension    Neuropathy    Spleen disease     Tobacco History: Social History   Tobacco Use  Smoking Status Some Days   Packs/day: 0.50   Types: Cigarettes   Start date: 07/20/1967  Smokeless Tobacco Never  Tobacco Comments   smokes 1 per day.   Ready to quit:  No Counseling given: Yes Tobacco comments: smokes 1 per day.   Outpatient Medications Prior to Visit  Medication Sig Dispense Refill   acetaminophen (TYLENOL) 500 MG tablet Take 1,000 mg by mouth every 8 (eight) hours as needed for moderate pain or headache.     atorvastatin (LIPITOR) 10 MG tablet Take 10 mg by mouth at bedtime.     BIOTIN PO Take 1 tablet by mouth daily with breakfast.     Calcium Carb-Cholecalciferol (CALCIUM+D3 PO) Take 1 tablet by mouth daily  after breakfast.     ferrous sulfate 325 (65 FE) MG tablet Take 1 tablet (325 mg total) by mouth 2 (two) times daily with a meal. 60 tablet 3   furosemide (LASIX) 40 MG tablet Take 1 tablet (40 mg total) by mouth daily. 60 tablet 3   gabapentin (NEURONTIN) 400 MG capsule Take 400-800 mg by mouth See admin instructions. Take 400 mg by mouth in the morning and 800 mg at bedtime     nicotine (NICODERM CQ - DOSED IN MG/24 HOURS) 21 mg/24hr patch Place 21 mg onto the skin daily.     Omega-3 Fatty Acids (FISH OIL PO) Take 1 capsule by mouth daily with breakfast.     omeprazole (PRILOSEC) 40 MG capsule Take 1 capsule (40 mg total) by mouth 2 (two) times daily before a meal. 60 capsule 6   potassium chloride SA (KLOR-CON) 20 MEQ tablet Take 1 tablet (20 mEq total) by mouth daily. 90 tablet 3   spironolactone (ALDACTONE) 25 MG tablet Take 2 tablets (50 mg total) by mouth daily. 720 tablet 0   sucralfate (CARAFATE) 1 GM/10ML suspension SHAKE LIQUID AND TAKE 10 ML(1 GRAM) BY MOUTH TWICE DAILY 420 mL 2   ursodiol (ACTIGALL) 300 MG capsule Take 2 capsules (600 mg total) by mouth 2 (two) times daily before a meal. 120 capsule 6   VITAMIN E PO Take 1 capsule by mouth daily with breakfast.     No facility-administered medications prior to visit.     Review of Systems:   Constitutional:   No  weight loss, night sweats,  Fevers, chills, fatigue, or  lassitude.  HEENT:   No headaches,  Difficulty swallowing,  Tooth/dental problems, or  Sore throat,                No sneezing, itching, ear ache, nasal congestion, post nasal drip,   CV:  No chest pain,  Orthopnea, PND, swelling in lower extremities, anasarca, dizziness, palpitations, syncope.   GI  No heartburn, indigestion, abdominal pain, nausea, vomiting, diarrhea, change in bowel habits, loss of appetite, bloody stools.   Resp: No shortness of breath with exertion or at rest.  No excess mucus, no productive cough,  No non-productive cough,  No coughing up  of blood.  No change in color of mucus.  No wheezing.  No chest wall deformity  Skin: no rash or lesions.  GU: no dysuria, change in color of urine, no urgency or frequency.  No flank pain, no hematuria   MS:  No joint pain or swelling.  No decreased range of motion.  No back pain.    Physical Exam  BP 120/78 (BP Location: Left Arm, Patient Position: Sitting, Cuff Size: Normal)   Pulse 98   Temp 98.1 F (36.7 C) (Oral)   Ht '5\' 2"'$  (1.575 m)   Wt 193 lb (87.5 kg)   SpO2 96%   BMI 35.30 kg/m   GEN: A/Ox3; pleasant , NAD, well nourished  HEENT:  [place]/AT,   NOSE-clear, THROAT-clear, no lesions, no postnasal drip or exudate noted.   NECK:  Supple w/ fair ROM; no JVD; normal carotid impulses w/o bruits; no thyromegaly or nodules palpated; no lymphadenopathy.    RESP  Clear  P & A; w/o, wheezes/ rales/ or rhonchi. no accessory muscle use, no dullness to percussion  CARD:  RRR, no m/r/g, tr  peripheral edema, pulses intact, no cyanosis or clubbing.  GI:   Soft & nt; general tenderness nml bowel sounds; no organomegaly or masses detected.   Musco: Warm bil, no deformities or joint swelling noted.   Neuro: alert, no focal deficits noted.    Skin: Warm, no lesions or rashes    Lab Results:  CBC   Imaging: CT Chest Wo Contrast  Result Date: 02/18/2021 CLINICAL DATA:  65 year old female with history of pulmonary nodule. Followup study. EXAM: CT CHEST WITHOUT CONTRAST TECHNIQUE: Multidetector CT imaging of the chest was performed following the standard protocol without IV contrast. COMPARISON:  Chest CTA 08/28/2020. FINDINGS: Cardiovascular: Heart size is normal. There is no significant pericardial fluid, thickening or pericardial calcification. There is aortic atherosclerosis, as well as atherosclerosis of the great vessels of the mediastinum and the coronary arteries, including calcified atherosclerotic plaque in the left main, left anterior descending, left circumflex and right  coronary arteries. Mild calcifications of the aortic valve. Severe calcifications of the mitral annulus. Mediastinum/Nodes: As with the prior study there are several prominent borderline enlarged prevascular lymph nodes, juxta pericardiac lymph nodes and hilar lymph nodes which appear grossly stable compared to the prior study (nonspecific, but presumably benign). Esophagus is unremarkable in appearance. No axillary lymphadenopathy. Lungs/Pleura: 5 mm nodule in the superior segment of the right lower lobe associated with the major fissure (axial image 60 of series 3), stable compared to the prior study. Several other small right-sided pulmonary nodules measuring 3 mm or less in size are also unchanged. No suspicious pulmonary nodules or masses otherwise noted. No acute consolidative airspace disease. No pleural effusions. Very mild paraseptal emphysema. Upper Abdomen: Liver has a shrunken appearance and nodular contour, indicative of advanced cirrhosis. Spleen is incompletely imaged, but appears likely enlarged. Musculoskeletal: There are no aggressive appearing lytic or blastic lesions noted in the visualized portions of the skeleton. IMPRESSION: 1. Small pulmonary nodules measuring 5 mm or less in size are stable compared to the prior study, nonspecific but statistically likely benign. No follow-up needed if patient is low-risk (and has no known or suspected primary neoplasm). Non-contrast chest CT can be considered in 12 months if patient is high-risk. This recommendation follows the consensus statement: Guidelines for Management of Incidental Pulmonary Nodules Detected on CT Images: From the Fleischner Society 2017; Radiology 2017; 284:228-243. 2. Aortic atherosclerosis, in addition to left main and 3 vessel coronary artery disease. Please note that although the presence of coronary artery calcium documents the presence of coronary artery disease, the severity of this disease and any potential stenosis cannot be  assessed on this non-gated CT examination. Assessment for potential risk factor modification, dietary therapy or pharmacologic therapy may be warranted, if clinically indicated. 3. There are calcifications of the aortic valve (mild) and mitral annulus (severe). Echocardiographic correlation for evaluation of potential valvular dysfunction may be warranted if clinically indicated. 4. Mild paraseptal emphysema. 5. Advanced cirrhosis. 6. Probable splenomegaly. Aortic Atherosclerosis (ICD10-I70.0) and Emphysema (ICD10-J43.9). Electronically Signed   By: Vinnie Langton M.D.   On: 02/18/2021 09:53      No flowsheet data  found.  No results found for: NITRICOXIDE      Assessment & Plan:   Chronic respiratory failure with hypoxia (HCC) Chronic hypoxic respiratory failure.  Patient is weaned off of daytime oxygen.  O2 saturations today in office are 96% on room air.  Overnight oximetry test results are pending.  We are contacting the local DME to get these results.  If these are negative and not show any nocturnal desaturations will DC oxygen.  Tobacco abuse Smoking cessation discussed in detail.  PFTs are pending.  Lung nodule Small scattered pulmonary nodules.  Patient is at high risk due to her smoking history.  Refer to the lung cancer screening program.  For yearly CT scans  Cirrhosis of liver ([place]) Continue follow-up with GI.     Rexene Edison, NP 02/19/2021

## 2021-02-19 NOTE — Progress Notes (Signed)
[place] [phone]   [phone]  PROGRESS NOTE  Patient Care Team: Hoyt Koch, MD as PCP - General (Internal Medicine)  Hematological/Oncological History # Splenic Infarct  08/27/2020: patient presented to [facility] ED with abdominal pain. CT abdomen showed advanced cirrhosis of the liver. Splenomegaly. 4-5 cm hypoperfused area in the anterior superior corner of the spleen presumably relating to a splenic infarction  #Iron Deficiency Anemia of Unclear Etiology, Likely GI Bleed 08/27/2020: WBC 11.4, Hgb 8.1, MCV 75.6, Plt 196 08/28/2020: Iron 20, TIBC 466, Sat 4%, Ferritin 8 12/01/2020: WBC 8.3, Hgb 13.1, MCV 87.8, Plt 156. Ferritin 27.2 02/19/2021: Hgb 13.0, Iron 82, Iron sat 24%, ferritin 42  Interval History:  Sierra Logan 65 y.o. female with medical history significant for splenic infarct and iron deficiency anemia who presents for a follow up visit. The patient was last seen on 12/04/2020. In the interim since the last visit she has had no major changes in her health.  On exam today Ms.Danzey reports she has been well in the interim since her last visit.  She notes that her energy levels are still quite low and she rates them as a 3 or 4 out of 10.  She notes that she eats red meat about once per day.  She is not having any issues with shortness of breath or any overt signs of bleeding.  She is been tolerating the p.o. iron pills well without any constipation or stomach upset.  She also denies any fevers, chills, sweats, nausea, vomiting, or diarrhea.  A full 10 point ROS is listed below.  MEDICAL HISTORY:  Past Medical History:  Diagnosis Date   Cirrhosis ([place])    Hyperlipemia    Hypertension    Neuropathy    Spleen disease     SURGICAL HISTORY: Past Surgical History:  Procedure Laterality Date   BIOPSY  10/20/2020   Procedure: BIOPSY;  Surgeon: Rush Landmark Telford Nab., MD;  Location: [facility];  Service: Gastroenterology;;    COLONOSCOPY WITH PROPOFOL N/A 10/20/2020   Procedure: COLONOSCOPY WITH PROPOFOL;  Surgeon: Irving Copas., MD;  Location: [facility];  Service: Gastroenterology;  Laterality: N/A;   ESOPHAGOGASTRODUODENOSCOPY (EGD) WITH PROPOFOL N/A 10/20/2020   Procedure: ESOPHAGOGASTRODUODENOSCOPY (EGD) WITH PROPOFOL;  Surgeon: Rush Landmark Telford Nab., MD;  Location: [facility];  Service: Gastroenterology;  Laterality: N/A;   INTRAOPERATIVE TRANSTHORACIC ECHOCARDIOGRAM  10/17/2020   Procedure: TRANSTHORACIC ECHOCARDIOGRAM;  Surgeon: Fay Records, MD;  Location: [place];  Service: Cardiovascular;;   POLYPECTOMY  10/20/2020   Procedure: POLYPECTOMY;  Surgeon: Irving Copas., MD;  Location: [facility];  Service: Gastroenterology;;   RADIOFREQUENCY ABLATION  10/20/2020   Procedure: RADIO FREQUENCY ABLATION;  Surgeon: Irving Copas., MD;  Location: [facility];  Service: Gastroenterology;;   RECONSTRUCTION OF NOSE     TRACHEOSTOMY     TRACHEOSTOMY CLOSURE      SOCIAL HISTORY: Social History   Socioeconomic History   Marital status: Married    Spouse name: Not on file   Number of children: 0   Years of education: Not on file   Highest education level: Not on file  Occupational History   Occupation: part time courier  Tobacco Use   Smoking status: Some Days    Packs/day: 0.50    Types: Cigarettes    Start date: 07/20/1967   Smokeless tobacco: Never   Tobacco comments:    smokes 1 per day.  Vaping Use   Vaping Use: Never used  Substance and Sexual Activity   Alcohol use: Not Currently   Drug use: Never   Sexual activity: Not on file  Other Topics Concern   Not on file  Social History Narrative   Not on file   Social Determinants of Health   Financial Resource Strain: Not on file  Food Insecurity: Not on file  Transportation Needs: Not on file  Physical Activity: Not on file  Stress: Not on file  Social Connections: Not on file  Intimate Partner Violence: Not  on file    FAMILY HISTORY: Family History  Problem Relation Age of Onset   GER disease Mother    Heart disease Mother    Diabetes Father    Hypertension Sister    Hypertension Brother    Prostate cancer Maternal Grandfather    Hypertension Brother    Hypertension Brother    Hypertension Brother    Colon cancer Neg Hx    Esophageal cancer Neg Hx    Kidney disease Neg Hx    Liver disease Neg Hx    Pancreatic cancer Neg Hx    Rectal cancer Neg Hx    Stomach cancer Neg Hx     ALLERGIES:  has No Known Allergies.  MEDICATIONS:  Current Outpatient Medications  Medication Sig Dispense Refill   acetaminophen (TYLENOL) 500 MG tablet Take 1,000 mg by mouth every 8 (eight) hours as needed for moderate pain or headache.     atorvastatin (LIPITOR) 10 MG tablet Take 10 mg by mouth at bedtime.     BIOTIN PO Take 1 tablet by mouth daily with breakfast.     Calcium Carb-Cholecalciferol (CALCIUM+D3 PO) Take 1 tablet by mouth daily after breakfast.     ferrous sulfate 325 (65 FE) MG tablet Take 1 tablet (325 mg total) by mouth 2 (two) times daily with a meal. 60 tablet 3   furosemide (LASIX) 40 MG tablet Take 1 tablet (40 mg total) by mouth daily. 60 tablet 3   gabapentin (NEURONTIN) 400 MG capsule Take 400-800 mg by mouth See admin instructions. Take 400 mg by mouth in the morning and 800 mg at bedtime     nicotine (NICODERM CQ - DOSED IN MG/24 HOURS) 21 mg/24hr patch Place 21 mg onto the skin daily.     Omega-3 Fatty Acids (FISH OIL PO) Take 1 capsule by mouth daily with breakfast.     omeprazole (PRILOSEC) 40 MG capsule Take 1 capsule (40 mg total) by mouth 2 (two) times daily before a meal. 60 capsule 6   potassium chloride SA (KLOR-CON) 20 MEQ tablet Take 1 tablet (20 mEq total) by mouth daily. 90 tablet 3   spironolactone (ALDACTONE) 25 MG tablet Take 2 tablets (50 mg total) by mouth daily. 720 tablet 0   sucralfate (CARAFATE) 1 GM/10ML suspension SHAKE LIQUID AND TAKE 10 ML(1 GRAM) BY  MOUTH TWICE DAILY 420 mL 2   ursodiol (ACTIGALL) 300 MG capsule Take 2 capsules (600 mg total) by mouth 2 (two) times daily before a meal. 120 capsule 6   VITAMIN E PO Take 1 capsule by mouth daily with breakfast.     No current facility-administered medications for this visit.    REVIEW OF SYSTEMS:   Constitutional: ( - ) fevers, ( - )  chills , ( - ) night sweats Eyes: ( - ) blurriness of vision, ( - ) double vision, ( - ) watery eyes Ears, nose, mouth, throat, and face: ( - ) mucositis, ( - ) sore  throat Respiratory: ( - ) cough, ( - ) dyspnea, ( - ) wheezes Cardiovascular: ( - ) palpitation, ( - ) chest discomfort, ( - ) lower extremity swelling Gastrointestinal:  ( - ) nausea, ( - ) heartburn, ( - ) change in bowel habits Skin: ( - ) abnormal skin rashes Lymphatics: ( - ) new lymphadenopathy, ( - ) easy bruising Neurological: ( - ) numbness, ( - ) tingling, ( - ) new weaknesses Behavioral/Psych: ( - ) mood change, ( - ) new changes  All other systems were reviewed with the patient and are negative.  PHYSICAL EXAMINATION: ECOG PERFORMANCE STATUS: 1 - Symptomatic but completely ambulatory  Vitals:   02/19/21 1439  BP: 123/63  Pulse: 100  Resp: 18  Temp: (!) 97.5 F (36.4 C)  SpO2: 94%   Filed Weights   02/19/21 1439  Weight: 193 lb 1 oz (87.6 kg)    GENERAL: well appearing  alert, no distress and comfortable SKIN: skin color, texture, turgor are normal, no rashes or significant lesions EYES: conjunctiva are pink and non-injected, sclera clear LUNGS: clear to auscultation and percussion with normal breathing effort HEART: regular rate & rhythm and no murmurs and no lower extremity edema Musculoskeletal: no cyanosis of digits and no clubbing  PSYCH: alert & oriented x 3, fluent speech NEURO: no focal motor/sensory deficits  LABORATORY DATA:  I have reviewed the data as listed CBC Latest Ref Rng & Units 02/19/2021 12/23/2020 12/11/2020  WBC 4.0 - 10.5 K/uL 8.3 6.2 7.4   Hemoglobin 12.0 - 15.0 g/dL 13.0 12.4 12.6  Hematocrit 36.0 - 46.0 % 38.6 36.9 38.9  Platelets 150 - 400 K/uL 141(L) 150.0 145(L)    CMP Latest Ref Rng & Units 02/19/2021 01/13/2021 12/23/2020  Glucose 70 - 99 mg/dL 208(H) 174(H) 166(H)  BUN 8 - 23 mg/dL '9 6 7  '$ Creatinine 0.44 - 1.00 mg/dL 0.89 0.80 0.85  Sodium 135 - 145 mmol/L 139 138 134(L)  Potassium 3.5 - 5.1 mmol/L 3.8 3.1(L) 3.1(L)  Chloride 98 - 111 mmol/L 101 95(L) 93(L)  CO2 22 - 32 mmol/L 26 34(H) 31  Calcium 8.9 - 10.3 mg/dL 9.3 9.2 8.5  Total Protein 6.5 - 8.1 g/dL 7.6 7.4 7.2  Total Bilirubin 0.3 - 1.2 mg/dL 0.5 0.5 0.7  Alkaline Phos 38 - 126 U/L 152(H) 155(H) 156(H)  AST 15 - 41 U/L 43(H) 29 78(H)  ALT 0 - 44 U/L 22 15 36(H)    RADIOGRAPHIC STUDIES: CT Chest Wo Contrast  Result Date: 02/18/2021 CLINICAL DATA:  65 year old female with history of pulmonary nodule. Followup study. EXAM: CT CHEST WITHOUT CONTRAST TECHNIQUE: Multidetector CT imaging of the chest was performed following the standard protocol without IV contrast. COMPARISON:  Chest CTA 08/28/2020. FINDINGS: Cardiovascular: Heart size is normal. There is no significant pericardial fluid, thickening or pericardial calcification. There is aortic atherosclerosis, as well as atherosclerosis of the great vessels of the mediastinum and the coronary arteries, including calcified atherosclerotic plaque in the left main, left anterior descending, left circumflex and right coronary arteries. Mild calcifications of the aortic valve. Severe calcifications of the mitral annulus. Mediastinum/Nodes: As with the prior study there are several prominent borderline enlarged prevascular lymph nodes, juxta pericardiac lymph nodes and hilar lymph nodes which appear grossly stable compared to the prior study (nonspecific, but presumably benign). Esophagus is unremarkable in appearance. No axillary lymphadenopathy. Lungs/Pleura: 5 mm nodule in the superior segment of the right lower lobe  associated with the major fissure (axial  image 60 of series 3), stable compared to the prior study. Several other small right-sided pulmonary nodules measuring 3 mm or less in size are also unchanged. No suspicious pulmonary nodules or masses otherwise noted. No acute consolidative airspace disease. No pleural effusions. Very mild paraseptal emphysema. Upper Abdomen: Liver has a shrunken appearance and nodular contour, indicative of advanced cirrhosis. Spleen is incompletely imaged, but appears likely enlarged. Musculoskeletal: There are no aggressive appearing lytic or blastic lesions noted in the visualized portions of the skeleton. IMPRESSION: 1. Small pulmonary nodules measuring 5 mm or less in size are stable compared to the prior study, nonspecific but statistically likely benign. No follow-up needed if patient is low-risk (and has no known or suspected primary neoplasm). Non-contrast chest CT can be considered in 12 months if patient is high-risk. This recommendation follows the consensus statement: Guidelines for Management of Incidental Pulmonary Nodules Detected on CT Images: From the Fleischner Society 2017; Radiology 2017; 284:228-243. 2. Aortic atherosclerosis, in addition to left main and 3 vessel coronary artery disease. Please note that although the presence of coronary artery calcium documents the presence of coronary artery disease, the severity of this disease and any potential stenosis cannot be assessed on this non-gated CT examination. Assessment for potential risk factor modification, dietary therapy or pharmacologic therapy may be warranted, if clinically indicated. 3. There are calcifications of the aortic valve (mild) and mitral annulus (severe). Echocardiographic correlation for evaluation of potential valvular dysfunction may be warranted if clinically indicated. 4. Mild paraseptal emphysema. 5. Advanced cirrhosis. 6. Probable splenomegaly. Aortic Atherosclerosis (ICD10-I70.0) and  Emphysema (ICD10-J43.9). Electronically Signed   By: Vinnie Langton M.D.   On: 02/18/2021 09:53    ASSESSMENT & PLAN Sierra Logan 65 y.o. female with medical history significant for splenic infarct and iron deficiency anemia who presents for a follow up visit.   After review the labs, the records, schedule the patient the findings most consistent with a splenic infarct of unclear etiology and iron deficiency anemia.  The most likely etiology of the patient's iron deficiency anemia is GI bleeding.  She has established care with [facility] physicians who found she has primary biliary cholangitis, GAVE, and esophageal varices.   The patient has responded markedly well to PO iron therapy. Her Hgb has risen to normal levels and how her iron stores are improving. We will continue with PO therapy to improve her iron stores.    In regards to her splenic infarct the etiology is unclear but may be secondary to the splenomegaly due to cirrhosis.  We performed a hypercoagulable work-up which was negative and therefore there is not any specific interventions required.  I would recommend continual symptom management.  Patient voiced understanding of this plan moving forward.  # Splenic Infarct  --hypercoagulable workup was negative, no evidence of a hypercoagulable disease --recommend best supportive care at this time  # Iron Deficiency Anemia of Unclear Etiology #Esophageal Varices in the Setting of Cirrhosis --most likely 2/2 to GI bleed, potentially oozing from the varices noted during prior GI workup --continue PO ferrous sulfate '325mg'$  daily with a source of vitamin C --patient currently connected with  GI for management of her cirrhosis.  --last iron studies show Ferritin 42, Sat 24%. This is a solid improvement from prior -- Hgb stable at 13.0 --RTC in 6 months.   No orders of the defined types were placed in this encounter.   All questions were answered. The patient knows to call the  clinic with  any problems, questions or concerns.  A total of more than 30 minutes were spent on this encounter and over half of that time was spent on counseling and coordination of care as outlined above.   Ledell Peoples, MD Department of Hematology/Oncology [place] [facility] Phone: [phone] Pager: [phone] Email: [email or web address]  02/19/2021 4:16 PM

## 2021-02-19 NOTE — Assessment & Plan Note (Signed)
Smoking cessation discussed in detail.  PFTs are pending.

## 2021-02-19 NOTE — Patient Instructions (Addendum)
Set up for PFT  Work on not smoking .  Activity as tolerated.  Refer to Lung cancer screening program.  Overnight oximetry test results .  Follow up with Dr. Elsworth Soho  in 3 -4 months and As needed

## 2021-02-19 NOTE — Assessment & Plan Note (Signed)
Chronic hypoxic respiratory failure.  Patient is weaned off of daytime oxygen.  O2 saturations today in office are 96% on room air.  Overnight oximetry test results are pending.  We are contacting the local DME to get these results.  If these are negative and not show any nocturnal desaturations will DC oxygen.

## 2021-02-19 NOTE — Assessment & Plan Note (Signed)
Small scattered pulmonary nodules.  Patient is at high risk due to her smoking history.  Refer to the lung cancer screening program.  For yearly CT scans

## 2021-02-19 NOTE — Assessment & Plan Note (Signed)
Continue follow up with GI

## 2021-02-20 ENCOUNTER — Telehealth: Payer: Self-pay | Admitting: Hematology and Oncology

## 2021-02-20 ENCOUNTER — Telehealth: Payer: Self-pay | Admitting: *Deleted

## 2021-02-20 NOTE — Telephone Encounter (Signed)
Scheduled per los. Called and left msg. Mailed printout  °

## 2021-02-20 NOTE — Telephone Encounter (Signed)
TCT patient regarding recent lab results. No answer but was able to leave vm message for pt to return call at her earliest convenience @ [phone]

## 2021-02-20 NOTE — Telephone Encounter (Signed)
-----   Message from Orson Slick, MD sent at 02/20/2021  8:58 AM EDT ----- Please let Sierra Logan know that her iron stores are building up nicely. I would recommend she continue the PO iron therapy. We will see her back in 6 months time to assure she continues boosting her levels.   ----- Message ----- From: Buel Ream, Lab In [place] Sent: 02/19/2021   2:42 PM EDT To: Orson Slick, MD

## 2021-02-25 ENCOUNTER — Other Ambulatory Visit: Payer: Self-pay | Admitting: Adult Health

## 2021-02-25 NOTE — Progress Notes (Signed)
Spoke with pt and notified of results per Dr. Elsworth Soho. Pt verbalized understanding and denied any questions. Copy faxed to Dr Sharlet Salina.

## 2021-02-26 LAB — SARS CORONAVIRUS 2 (TAT 6-24 HRS): SARS Coronavirus 2: NEGATIVE

## 2021-02-27 ENCOUNTER — Ambulatory Visit (INDEPENDENT_AMBULATORY_CARE_PROVIDER_SITE_OTHER): Payer: 59 | Admitting: Pulmonary Disease

## 2021-02-27 ENCOUNTER — Other Ambulatory Visit: Payer: Self-pay

## 2021-02-27 DIAGNOSIS — R911 Solitary pulmonary nodule: Secondary | ICD-10-CM

## 2021-02-27 DIAGNOSIS — Z720 Tobacco use: Secondary | ICD-10-CM

## 2021-02-27 DIAGNOSIS — J9611 Chronic respiratory failure with hypoxia: Secondary | ICD-10-CM

## 2021-02-27 LAB — PULMONARY FUNCTION TEST
DL/VA % pred: 77 %
DL/VA: 3.28 ml/min/mmHg/L
DLCO cor % pred: 59 %
DLCO cor: 11.13 ml/min/mmHg
DLCO unc % pred: 59 %
DLCO unc: 10.99 ml/min/mmHg
FEF 25-75 Post: 1.04 L/sec
FEF 25-75 Pre: 0.97 L/sec
FEF2575-%Change-Post: 6 %
FEF2575-%Pred-Post: 50 %
FEF2575-%Pred-Pre: 47 %
FEV1-%Change-Post: 0 %
FEV1-%Pred-Post: 54 %
FEV1-%Pred-Pre: 54 %
FEV1-Post: 1.23 L
FEV1-Pre: 1.22 L
FEV1FVC-%Change-Post: 7 %
FEV1FVC-%Pred-Pre: 100 %
FEV6-%Change-Post: -6 %
FEV6-%Pred-Post: 51 %
FEV6-%Pred-Pre: 55 %
FEV6-Post: 1.46 L
FEV6-Pre: 1.57 L
FEV6FVC-%Pred-Post: 104 %
FEV6FVC-%Pred-Pre: 104 %
FVC-%Change-Post: -6 %
FVC-%Pred-Post: 49 %
FVC-%Pred-Pre: 53 %
FVC-Post: 1.46 L
FVC-Pre: 1.57 L
Post FEV1/FVC ratio: 84 %
Post FEV6/FVC ratio: 100 %
Pre FEV1/FVC ratio: 78 %
Pre FEV6/FVC Ratio: 100 %
RV % pred: 115 %
RV: 2.28 L
TLC % pred: 85 %
TLC: 4.07 L

## 2021-02-27 NOTE — Progress Notes (Signed)
PFT done today. 

## 2021-03-03 ENCOUNTER — Telehealth: Payer: Self-pay

## 2021-03-03 NOTE — Telephone Encounter (Signed)
Notified Patient of denial of Prior Authorization request for Sucralfate 1 gram/10 ml Suspension. Appeal filed. Awaiting response

## 2021-03-04 ENCOUNTER — Telehealth: Payer: Self-pay | Admitting: Internal Medicine

## 2021-03-04 NOTE — Telephone Encounter (Signed)
  Patient states she no l onger uses oxygen  Patient requesting order to discontinue oxygen use and pick up tanks Fax to [employer] at [phone]

## 2021-03-06 ENCOUNTER — Other Ambulatory Visit: Payer: 59

## 2021-03-06 ENCOUNTER — Ambulatory Visit: Payer: 59 | Admitting: Hematology and Oncology

## 2021-03-06 NOTE — Progress Notes (Signed)
Attempted to reach patient with results and left a message for patient to call back.

## 2021-03-09 ENCOUNTER — Telehealth: Payer: Self-pay | Admitting: Pulmonary Disease

## 2021-03-09 NOTE — Telephone Encounter (Signed)
Pulmonary would do this. They were still waiting on results of overnight test so she can call them to see if they got this.

## 2021-03-09 NOTE — Telephone Encounter (Signed)
See below

## 2021-03-09 NOTE — Telephone Encounter (Signed)
Sierra Noel, MD  02/28/2021  2:52 PM EDT     Lung function is low She has to QUIT smoking!   Spoke with pt and notified of results per Dr. Elsworth Soho. Pt verbalized understanding and denied any questions.

## 2021-03-11 NOTE — Telephone Encounter (Signed)
Called patient. LDVM letting her know that she will need to contact her pulmonologist in regards to discontinuing her oxygen. Office number was provided in case she has additional questions or concerns.

## 2021-04-06 ENCOUNTER — Telehealth: Payer: Self-pay | Admitting: Gastroenterology

## 2021-04-06 NOTE — Telephone Encounter (Signed)
Returned pt call to inquire further. States she believes her itching is related to Omeprazole. Asked when her itching started. States it began when she began taking K+ and is only affecting her lower extremities. Denies starting any new medications EXCEPT K+. Also denies using any new lotions, creams, powders or detergents. Denies being outside working in any flower beds that may be contributing to rash/itching. Given the length of time she has been on Omeprazole, advised she contact her PCP for further evaluation/treatment/care of her itching. Verbalized acceptance and understanding.

## 2021-04-06 NOTE — Telephone Encounter (Signed)
Inbound call from patient. States she is experiencing itching all over reaction from medications.

## 2021-04-07 ENCOUNTER — Other Ambulatory Visit: Payer: Self-pay

## 2021-04-07 ENCOUNTER — Encounter: Payer: Self-pay | Admitting: Emergency Medicine

## 2021-04-07 ENCOUNTER — Ambulatory Visit (INDEPENDENT_AMBULATORY_CARE_PROVIDER_SITE_OTHER): Payer: PPO | Admitting: Emergency Medicine

## 2021-04-07 VITALS — BP 132/72 | HR 100 | Ht 62.0 in | Wt 191.0 lb

## 2021-04-07 DIAGNOSIS — Z23 Encounter for immunization: Secondary | ICD-10-CM

## 2021-04-07 DIAGNOSIS — K743 Primary biliary cirrhosis: Secondary | ICD-10-CM | POA: Diagnosis not present

## 2021-04-07 DIAGNOSIS — L299 Pruritus, unspecified: Secondary | ICD-10-CM | POA: Diagnosis not present

## 2021-04-07 LAB — CBC WITH DIFFERENTIAL/PLATELET
Basophils Absolute: 0.1 10*3/uL (ref 0.0–0.1)
Basophils Relative: 1 % (ref 0.0–3.0)
Eosinophils Absolute: 0.1 10*3/uL (ref 0.0–0.7)
Eosinophils Relative: 1.3 % (ref 0.0–5.0)
HCT: 39.8 % (ref 36.0–46.0)
Hemoglobin: 13.4 g/dL (ref 12.0–15.0)
Lymphocytes Relative: 24.4 % (ref 12.0–46.0)
Lymphs Abs: 1.8 10*3/uL (ref 0.7–4.0)
MCHC: 33.7 g/dL (ref 30.0–36.0)
MCV: 94.1 fl (ref 78.0–100.0)
Monocytes Absolute: 0.6 10*3/uL (ref 0.1–1.0)
Monocytes Relative: 7.7 % (ref 3.0–12.0)
Neutro Abs: 4.7 10*3/uL (ref 1.4–7.7)
Neutrophils Relative %: 65.6 % (ref 43.0–77.0)
Platelets: 152 10*3/uL (ref 150.0–400.0)
RBC: 4.23 Mil/uL (ref 3.87–5.11)
RDW: 14.1 % (ref 11.5–15.5)
WBC: 7.2 10*3/uL (ref 4.0–10.5)

## 2021-04-07 LAB — COMPREHENSIVE METABOLIC PANEL
ALT: 24 U/L (ref 0–35)
AST: 48 U/L — ABNORMAL HIGH (ref 0–37)
Albumin: 3.9 g/dL (ref 3.5–5.2)
Alkaline Phosphatase: 149 U/L — ABNORMAL HIGH (ref 39–117)
BUN: 9 mg/dL (ref 6–23)
CO2: 33 mEq/L — ABNORMAL HIGH (ref 19–32)
Calcium: 9.4 mg/dL (ref 8.4–10.5)
Chloride: 95 mEq/L — ABNORMAL LOW (ref 96–112)
Creatinine, Ser: 0.74 mg/dL (ref 0.40–1.20)
GFR: 85.17 mL/min (ref >60.00)
Glucose, Bld: 199 mg/dL — ABNORMAL HIGH (ref 70–99)
Potassium: 3.8 mEq/L (ref 3.5–5.1)
Sodium: 136 mEq/L (ref 135–145)
Total Bilirubin: 0.6 mg/dL (ref 0.2–1.2)
Total Protein: 8.1 g/dL (ref 6.0–8.3)

## 2021-04-07 NOTE — Patient Instructions (Signed)
Pruritus Pruritus is an itchy feeling on the skin. One of the most common causes is dry skin, but many different things can cause itching. Most cases of itching do notrequire medical attention. Sometimes itchy skin can turn into a rash. Follow these instructions at home: Skin care  Apply moisturizing lotion to your skin as needed. Lotion that contains petroleum jelly is best. Take medicines or apply medicated creams only as told by your health care provider. This may include: Corticosteroid cream. Anti-itch lotions. Oral antihistamines. Apply a cool, wet cloth (cool compress) to the affected areas. Take baths with one of the following: Epsom salts. You can get these at your local pharmacy or grocery store. Follow the instructions on the packaging. Baking soda. Pour a small amount into the bath as told by your health care provider. Colloidal oatmeal. You can get this at your local pharmacy or grocery store. Follow the instructions on the packaging. Apply baking soda paste to your skin. To make the paste, stir water into a small amount of baking soda until it reaches a paste-like consistency. Do not scratch your skin. Do not take hot showers or baths, which can make itching worse. A cool shower may help with itching as long as you apply moisturizing lotion after the shower. Do not use scented soaps, detergents, perfumes, and cosmetic products. Instead, use gentle, unscented versions of these items.  General instructions Avoid wearing tight clothes. Keep a journal to help find out what is causing your itching. Write down: What you eat and drink. What cosmetic products you use. What soaps or detergents you use. What you wear, including jewelry. Use a humidifier. This keeps the air moist, which helps to prevent dry skin. Be aware of any changes in your itchiness. Contact a health care provider if: The itching does not go away after several days. You are unusually thirsty or urinating more  than normal. Your skin tingles or feels numb. Your skin or the white parts of your eyes turn yellow (jaundice). You feel weak. You have any of the following: Night sweats. Tiredness (fatigue). Weight loss. Abdominal pain. Summary Pruritus is an itchy feeling on the skin. One of the most common causes is dry skin, but many different conditions and factors can cause itching. Apply moisturizing lotion to your skin as needed. Lotion that contains petroleum jelly is best. Take medicines or apply medicated creams only as told by your health care provider. Do not take hot showers or baths. Do not use scented soaps, detergents, perfumes, or cosmetic products. This information is not intended to replace advice given to you by your health care provider. Make sure you discuss any questions you have with your healthcare provider. Document Revised: 07/19/2017 Document Reviewed: 07/19/2017 Elsevier Patient Education  2022 [employer]vier Inc.  

## 2021-04-07 NOTE — Progress Notes (Signed)
Sierra Logan 65 y.o.   Chief Complaint  Patient presents with   itching    All over body, better now then a few months ago when started.    HISTORY OF PRESENT ILLNESS: This is a 65 y.o. female here for an acute visit.  Patient of Dr. Sharlet Salina. Patient has history of liver cirrhosis and primary biliary cholangitis complaining of generalized itching for 1 month. Patient stopped Actigall and ferrous sulfate and is doing much better. No other associated symptoms. Still smoking but less according to her. No other new medications.  HPI   Prior to Admission medications   Medication Sig Start Date End Date Taking? Authorizing Provider  acetaminophen (TYLENOL) 500 MG tablet Take 1,000 mg by mouth every 8 (eight) hours as needed for moderate pain or headache.    [provider]  atorvastatin (LIPITOR) 10 MG tablet Take 10 mg by mouth at bedtime. 07/16/20   [provider]  BIOTIN PO Take 1 tablet by mouth daily with breakfast.    [provider]  Calcium Carb-Cholecalciferol (CALCIUM+D3 PO) Take 1 tablet by mouth daily after breakfast.    [provider]  ferrous sulfate 325 (65 FE) MG tablet Take 1 tablet (325 mg total) by mouth 2 (two) times daily with a meal. Patient not taking: Reported on 04/07/2021 08/31/20   Rai, Vernelle Emerald, MD  furosemide (LASIX) 40 MG tablet Take 1 tablet (40 mg total) by mouth daily. 12/11/20   Mansouraty, Telford Nab., MD  gabapentin (NEURONTIN) 400 MG capsule Take 400-800 mg by mouth See admin instructions. Take 400 mg by mouth in the morning and 800 mg at bedtime 06/16/20   [provider]  nicotine (NICODERM CQ - DOSED IN MG/24 HOURS) 21 mg/24hr patch Place 21 mg onto the skin daily.    [provider]  Omega-3 Fatty Acids (FISH OIL PO) Take 1 capsule by mouth daily with breakfast.    [provider]  omeprazole (PRILOSEC) 40 MG capsule Take 1 capsule (40 mg total) by mouth 2 (two) times daily before a  meal. 10/20/20   Mansouraty, Telford Nab., MD  potassium chloride SA (KLOR-CON) 20 MEQ tablet Take 1 tablet (20 mEq total) by mouth daily. 01/16/21   Hoyt Koch, MD  spironolactone (ALDACTONE) 25 MG tablet Take 2 tablets (50 mg total) by mouth daily. 12/17/20 12/17/21  Mansouraty, Telford Nab., MD  sucralfate (CARAFATE) 1 GM/10ML suspension SHAKE LIQUID AND TAKE 10 ML(1 GRAM) BY MOUTH TWICE DAILY 02/19/21   Orson Slick, MD  ursodiol (ACTIGALL) 300 MG capsule Take 2 capsules (600 mg total) by mouth 2 (two) times daily before a meal. Patient not taking: Reported on 04/07/2021 10/20/20 05/18/21  Mansouraty, Telford Nab., MD  VITAMIN E PO Take 1 capsule by mouth daily with breakfast.    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) 08/28/2020   Lung nodule 08/28/2020  Past Medical History:  Diagnosis Date   Cirrhosis ([place])    Hyperlipemia    Hypertension    Neuropathy    Spleen disease     Past Surgical History:  Procedure Laterality Date   BIOPSY  10/20/2020   Procedure: BIOPSY;  Surgeon: Rush Landmark Telford Nab., MD;  Location: [facility];  Service: Gastroenterology;;   COLONOSCOPY WITH PROPOFOL N/A 10/20/2020   Procedure: COLONOSCOPY WITH PROPOFOL;  Surgeon: Irving Copas., MD;  Location: [facility];  Service: Gastroenterology;  Laterality: N/A;    ESOPHAGOGASTRODUODENOSCOPY (EGD) WITH PROPOFOL N/A 10/20/2020   Procedure: ESOPHAGOGASTRODUODENOSCOPY (EGD) WITH PROPOFOL;  Surgeon: Rush Landmark Telford Nab., MD;  Location: [facility];  Service: Gastroenterology;  Laterality: N/A;   INTRAOPERATIVE TRANSTHORACIC ECHOCARDIOGRAM  10/17/2020   Procedure: TRANSTHORACIC ECHOCARDIOGRAM;  Surgeon: Fay Records, MD;  Location: [place];  Service: Cardiovascular;;   POLYPECTOMY  10/20/2020   Procedure: POLYPECTOMY;  Surgeon: Irving Copas., MD;  Location: [facility];  Service: Gastroenterology;;   RADIOFREQUENCY ABLATION  10/20/2020   Procedure: RADIO FREQUENCY ABLATION;  Surgeon: Irving Copas., MD;  Location: [facility];  Service: Gastroenterology;;   RECONSTRUCTION OF NOSE     TRACHEOSTOMY     TRACHEOSTOMY CLOSURE      Social History   Socioeconomic History   Marital status: Married    Spouse name: Not on file   Number of children: 0   Years of education: Not on file   Highest education level: Not on file  Occupational History   Occupation: part time courier  Tobacco Use   Smoking status: Some Days    Packs/day: 0.50    Types: Cigarettes    Start date: 07/20/1967   Smokeless tobacco: Never   Tobacco comments:    smokes 1 per day.  Vaping Use   Vaping Use: Never used  Substance and Sexual Activity   Alcohol use: Not Currently   Drug use: Never   Sexual activity: Not on file  Other Topics Concern   Not on file  Social History Narrative   Not on file   Social Determinants of Health   Financial Resource Strain: Not on file  Food Insecurity: Not on file  Transportation Needs: Not on file  Physical Activity: Not on file  Stress: Not on file  Social Connections: Not on file  Intimate Partner Violence: Not on file    Family History  Problem Relation Age of Onset   GER disease Mother    Heart disease Mother    Diabetes Father    Hypertension Sister    Hypertension Brother    Prostate cancer Maternal  Grandfather    Hypertension Brother    Hypertension Brother    Hypertension Brother    Colon cancer Neg Hx    Esophageal cancer Neg Hx    Kidney disease Neg Hx    Liver disease Neg Hx    Pancreatic cancer Neg Hx    Rectal cancer Neg Hx    Stomach cancer Neg Hx      Review of Systems  Constitutional: Negative.  Negative for chills and fever.  HENT: Negative.  Negative for congestion and sore throat.   Respiratory: Negative.  Negative for cough and shortness of breath.   Cardiovascular: Negative.  Negative for chest pain and palpitations.  Gastrointestinal:  Negative for abdominal pain, diarrhea, nausea and vomiting.  Genitourinary: Negative.  Negative for dysuria and hematuria.  Skin:  Positive for itching. Negative for rash.  Neurological:  Negative for dizziness and  headaches.  All other systems reviewed and are negative.  Today's Vitals   04/07/21 1319  Weight: 191 lb (86.6 kg)  Height: 5\' 2"  (1.575 m)   Body mass index is 34.93 kg/m.  Physical Exam Vitals reviewed.  Constitutional:      Appearance: Normal appearance. She is obese.  HENT:     Head: Normocephalic.  Eyes:     Extraocular Movements: Extraocular movements intact.     Pupils: Pupils are equal, round, and reactive to light.  Cardiovascular:     Rate and Rhythm: Normal rate and regular rhythm.     Pulses: Normal pulses.     Heart sounds: Normal heart sounds.  Pulmonary:     Effort: Pulmonary effort is normal.     Breath sounds: Normal breath sounds.  Abdominal:     Palpations: Abdomen is soft.     Tenderness: There is no abdominal tenderness.  Musculoskeletal:     Cervical back: Normal range of motion.     Right lower leg: No edema.     Left lower leg: No edema.  Skin:    General: Skin is warm and dry.     Capillary Refill: Capillary refill takes less than 2 seconds.     Findings: No rash.  Neurological:     General: No focal deficit present.     Mental Status: She is alert and oriented to  person, place, and time.     Comments: No signs of hepatic encephalopathy  Psychiatric:        Mood and Affect: Mood normal.        Behavior: Behavior normal.     ASSESSMENT & PLAN: Pruritus Could be secondary to use of Actigall but could also be a symptom of her underlying primary biliary cholangitis and subsequent liver cirrhosis. May take low-dose Benadryl as needed. Must follow-up with GI doctor and PCP in the next several weeks. Sierra Logan was seen today for itching.  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 One of the most common causes is dry skin, but many different things can cause itching. Most cases of itching do not require medical attention. Sometimes itchy skin can turn into a rash. Follow these instructions at home: Skin care  Apply moisturizing lotion to your skin as needed. Lotion that contains petroleum jelly is best. Take medicines or apply medicated creams only as told by your health care provider. This may include: Corticosteroid cream. Anti-itch lotions. Oral antihistamines. Apply a cool, wet cloth (cool compress) to the affected areas. Take baths with one of the following: Epsom salts. You can get these at your local pharmacy or grocery store. Follow the instructions on the packaging. Baking soda. Pour a small amount into the bath as told by your health care provider. Colloidal oatmeal. You can get this at your local pharmacy or grocery store. Follow the instructions on the packaging. Apply baking soda paste to your skin. To make the paste, stir water into a small amount of baking soda until it reaches a  paste-like consistency. Do not scratch your skin. Do not take hot showers or baths, which can make  itching worse. A cool shower may help with itching as long as you apply moisturizing lotion after the shower. Do not use scented soaps, detergents, perfumes, and cosmetic products. Instead, use gentle, unscented versions of these items. General instructions Avoid wearing tight clothes. Keep a journal to help find out what is causing your itching. Write down: What you eat and drink. What cosmetic products you use. What soaps or detergents you use. What you wear, including jewelry. Use a humidifier. This keeps the air moist, which helps to prevent dry skin. Be aware of any changes in your itchiness. Contact a health care provider if: The itching does not go away after several days. You are unusually thirsty or urinating more than normal. Your skin tingles or feels numb. Your skin or the white parts of your eyes turn yellow (jaundice). You feel weak. You have any of the following: Night sweats. Tiredness (fatigue). Weight loss. Abdominal pain. Summary Pruritus is an itchy feeling on the skin. One of the most common causes is dry skin, but many different conditions and factors can cause itching. Apply moisturizing lotion to your skin as needed. Lotion that contains petroleum jelly is best. Take medicines or apply medicated creams only as told by your health care provider. Do not take hot showers or baths. Do not use scented soaps, detergents, perfumes, or cosmetic products. This information is not intended to replace advice given to you by your health care provider. Make sure you discuss any questions you have with your health care provider. Document Revised: 07/19/2017 Document Reviewed: 07/19/2017 Elsevier Patient Education  2022 [place], MD [facility]

## 2021-04-07 NOTE — Assessment & Plan Note (Signed)
Could be secondary to use of Actigall but could also be a symptom of her underlying primary biliary cholangitis and subsequent liver cirrhosis. May take low-dose Benadryl as needed. Must follow-up with GI doctor and PCP in the next several weeks.

## 2021-04-13 NOTE — Telephone Encounter (Signed)
Inbound call from patient stating she did go to PCP and is requesting another call from nurse to discuss please.

## 2021-04-13 NOTE — Telephone Encounter (Signed)
Left message on machine to call back  

## 2021-04-14 NOTE — Telephone Encounter (Signed)
Left message on machine to call back  

## 2021-04-15 NOTE — Telephone Encounter (Signed)
Left message on machine to call back  

## 2021-04-15 NOTE — Telephone Encounter (Signed)
I have been unable to reach pt by phone will await further communication from the pt

## 2021-04-30 NOTE — Telephone Encounter (Signed)
Patient states she visited her PCP. Patient states she was informed to take oatmeal baths, not helping with itching. Please advise thanks Contact number [phone].Marland Kitchen

## 2021-04-30 NOTE — Telephone Encounter (Signed)
The patient has been on omeprazole since 2021.   The likelihood that this is causing her itching is low. If she feels uncomfortable with continuing omeprazole then I think it is okay that she can to stop it and we can see what happens in regards to her symptoms. If she has not had a recent hepatic function panel to ensure that alkaline phosphatase has not increased, then that should be performed. Thanks. GM

## 2021-04-30 NOTE — Telephone Encounter (Signed)
Dr Rush Landmark please review the pt believes she began itching due to omeprazole.  She stopped taking it and saw her PCP.  She has been using oatmeal baths but still continues to itch.  Should she follow up with PCP again?  Do you have any further recommendations?

## 2021-05-01 ENCOUNTER — Other Ambulatory Visit: Payer: Self-pay

## 2021-05-01 DIAGNOSIS — L299 Pruritus, unspecified: Secondary | ICD-10-CM

## 2021-05-01 NOTE — Telephone Encounter (Signed)
The pt has been advised and will come in for labs next week.  She will stop omeprazole and see if the itching subsides.

## 2021-05-01 NOTE — Telephone Encounter (Signed)
Left message on machine to call back  

## 2021-05-04 ENCOUNTER — Other Ambulatory Visit (INDEPENDENT_AMBULATORY_CARE_PROVIDER_SITE_OTHER): Payer: PPO

## 2021-05-04 DIAGNOSIS — L299 Pruritus, unspecified: Secondary | ICD-10-CM | POA: Diagnosis not present

## 2021-05-04 DIAGNOSIS — Z8639 Personal history of other endocrine, nutritional and metabolic disease: Secondary | ICD-10-CM

## 2021-05-05 ENCOUNTER — Other Ambulatory Visit: Payer: PPO

## 2021-05-05 DIAGNOSIS — K743 Primary biliary cirrhosis: Secondary | ICD-10-CM

## 2021-05-05 DIAGNOSIS — K31819 Angiodysplasia of stomach and duodenum without bleeding: Secondary | ICD-10-CM

## 2021-05-05 LAB — HEPATIC FUNCTION PANEL
ALT: 24 U/L (ref 0–35)
AST: 37 U/L (ref 0–37)
Albumin: 4 g/dL (ref 3.5–5.2)
Alkaline Phosphatase: 174 U/L — ABNORMAL HIGH (ref 39–117)
Bilirubin, Direct: 0.2 mg/dL (ref 0.0–0.3)
Total Bilirubin: 0.6 mg/dL (ref 0.2–1.2)
Total Protein: 7.9 g/dL (ref 6.0–8.3)

## 2021-05-05 LAB — BASIC METABOLIC PANEL
BUN: 11 mg/dL (ref 6–23)
CO2: 31 mEq/L (ref 19–32)
Calcium: 9.9 mg/dL (ref 8.4–10.5)
Chloride: 91 mEq/L — ABNORMAL LOW (ref 96–112)
Creatinine, Ser: 0.68 mg/dL (ref 0.40–1.20)
GFR: 91.63 mL/min (ref >60.00)
Glucose, Bld: 313 mg/dL — ABNORMAL HIGH (ref 70–99)
Potassium: 3.5 mEq/L (ref 3.5–5.1)
Sodium: 131 mEq/L — ABNORMAL LOW (ref 135–145)

## 2021-05-07 ENCOUNTER — Telehealth: Payer: Self-pay | Admitting: Gastroenterology

## 2021-05-07 NOTE — Telephone Encounter (Signed)
The pt has been given her lab results and notified that the stool test have not resulted.  She will be contacted when available.

## 2021-05-07 NOTE — Telephone Encounter (Signed)
Patient called wanting to know the results of her labwork.  Please call.  Thank you.

## 2021-05-09 LAB — STOOL CULTURE: E coli, Shiga toxin Assay: NEGATIVE

## 2021-05-15 ENCOUNTER — Telehealth: Payer: Self-pay | Admitting: Gastroenterology

## 2021-05-15 NOTE — Telephone Encounter (Signed)
The pt has been advised that our office did not call. We discussed that we spoke a week ago about results and nothing further is needed.

## 2021-05-15 NOTE — Telephone Encounter (Signed)
Patient returning your call.

## 2021-05-16 LAB — OVA AND PARASITE EXAMINATION
CONCENTRATE RESULT:: NONE SEEN
MICRO NUMBER:: 12517279
SPECIMEN QUALITY:: ADEQUATE
TRICHROME RESULT:: NONE SEEN

## 2021-05-16 LAB — PANCREATIC ELASTASE, FECAL: Pancreatic Elastase-1, Stool: >500 mcg/g

## 2021-05-16 LAB — CLOSTRIDIUM DIFFICILE TOXIN B, QUALITATIVE, REAL-TIME PCR: Toxigenic C. Difficile by PCR: NOT DETECTED

## 2021-08-24 ENCOUNTER — Other Ambulatory Visit: Payer: Self-pay | Admitting: Hematology and Oncology

## 2021-08-24 ENCOUNTER — Inpatient Hospital Stay: Payer: PPO | Attending: Hematology and Oncology | Admitting: Hematology and Oncology

## 2021-08-24 ENCOUNTER — Inpatient Hospital Stay: Payer: PPO

## 2021-08-24 DIAGNOSIS — D500 Iron deficiency anemia secondary to blood loss (chronic): Secondary | ICD-10-CM

## 2021-11-09 ENCOUNTER — Encounter: Payer: Self-pay | Admitting: Internal Medicine

## 2021-11-09 ENCOUNTER — Ambulatory Visit (INDEPENDENT_AMBULATORY_CARE_PROVIDER_SITE_OTHER): Payer: PPO | Admitting: Internal Medicine

## 2021-11-09 VITALS — BP 130/80 | HR 93 | Resp 18 | Ht 62.0 in | Wt 185.0 lb

## 2021-11-09 DIAGNOSIS — I700 Atherosclerosis of aorta: Secondary | ICD-10-CM

## 2021-11-09 DIAGNOSIS — Z23 Encounter for immunization: Secondary | ICD-10-CM | POA: Diagnosis not present

## 2021-11-09 DIAGNOSIS — E2839 Other primary ovarian failure: Secondary | ICD-10-CM

## 2021-11-09 DIAGNOSIS — Z0001 Encounter for general adult medical examination with abnormal findings: Secondary | ICD-10-CM | POA: Diagnosis not present

## 2021-11-09 DIAGNOSIS — Z720 Tobacco use: Secondary | ICD-10-CM | POA: Diagnosis not present

## 2021-11-09 DIAGNOSIS — L299 Pruritus, unspecified: Secondary | ICD-10-CM | POA: Diagnosis not present

## 2021-11-09 DIAGNOSIS — E876 Hypokalemia: Secondary | ICD-10-CM

## 2021-11-09 DIAGNOSIS — R739 Hyperglycemia, unspecified: Secondary | ICD-10-CM | POA: Diagnosis not present

## 2021-11-09 DIAGNOSIS — K743 Primary biliary cirrhosis: Secondary | ICD-10-CM | POA: Diagnosis not present

## 2021-11-09 LAB — CBC
HCT: 43 % (ref 36.0–46.0)
Hemoglobin: 14.5 g/dL (ref 12.0–15.0)
MCHC: 33.8 g/dL (ref 30.0–36.0)
MCV: 86.2 fl (ref 78.0–100.0)
Platelets: 140 10*3/uL — ABNORMAL LOW (ref 150.0–400.0)
RBC: 4.99 Mil/uL (ref 3.87–5.11)
RDW: 17.2 % — ABNORMAL HIGH (ref 11.5–15.5)
WBC: 8.4 10*3/uL (ref 4.0–10.5)

## 2021-11-09 MED ORDER — OMEPRAZOLE 40 MG PO CPDR: 40.0000 mg | DELAYED_RELEASE_CAPSULE | Freq: Every day | ORAL | 3 refills | Status: DC

## 2021-11-09 MED ORDER — ATORVASTATIN CALCIUM 10 MG PO TABS: 10.0000 mg | ORAL_TABLET | Freq: Every day | ORAL | 3 refills | Status: DC

## 2021-11-09 MED ORDER — GABAPENTIN 400 MG PO CAPS: 400.0000 mg | ORAL_CAPSULE | ORAL | 1 refills | Status: DC

## 2021-11-09 MED ORDER — FUROSEMIDE 40 MG PO TABS
40.0000 mg | ORAL_TABLET | Freq: Every day | ORAL | 3 refills | Status: DC
Start: 2021-11-09 — End: 2023-11-08

## 2021-11-09 NOTE — Patient Instructions (Signed)
We will check the labs today. ? ?We have given you the pneumonia shot and will get the bone density ordered. ? ? ?

## 2021-11-09 NOTE — Progress Notes (Signed)
? ?Subjective:  ? ?Patient ID: Sierra Logan, female    DOB: [birth year], 66 y.o.   MRN: [id number] ? ?HPI ?Here for welcome to medicare wellness and physical, no new complaints. Please see A/P for status and treatment of chronic medical problems.  ? ?Diet: heart healthy ?Physical activity: sedentary ?Depression/mood screen: negative ?Hearing: intact to whispered voice, mild loss bilaterally ?Visual acuity: grossly normal, performs annual eye exam  ?ADLs: capable ?Fall risk: low ?Home safety: good ?Cognitive evaluation: intact to orientation, naming, recall and repetition ?EOL planning: adv directives discussed, not in place, does not want heroic measures ? ?[place] Office Visit from 11/09/2021 in [facility] [place]  ?PHQ-2 Total Score 0  ? ?  ?  ? ? ?  08/31/2020  ?  7:38 AM 10/20/2020  ? 11:45 AM 12/04/2020  ?  2:52 PM 12/11/2020  ?  8:46 AM 11/09/2021  ?  3:54 PM  ?Fall Risk  ?Falls in the past year?     0  ?Patient Fall Risk Level Moderate fall risk High fall risk Low fall risk Moderate fall risk Low fall risk  ? ?Vision Screening  ? Right eye Left eye Both eyes  ?Without correction     ?With correction  20/30   ?Comments: Pt stated that she cannot see out of her right eye  ? ?Functional Status Survey: ?Is the patient deaf or have difficulty hearing?: No ?Does the patient have difficulty seeing, even when wearing glasses/contacts?: No ?Does the patient have difficulty concentrating, remembering, or making decisions?: No ?Does the patient have difficulty walking or climbing stairs?: No ?Does the patient have difficulty dressing or bathing?: No ?Does the patient have difficulty doing errands alone such as visiting a doctor's office or shopping?: No ? ?I have personally reviewed and have noted ?1. The patient's medical and social history - reviewed today no changes ?2. Their use of alcohol, tobacco or illicit drugs ?3. Their current medications and supplements ?4. The patient's functional ability  including ADL's, fall risks, home safety risks and hearing or visual impairment. ?5. Diet and physical activities ?6. Evidence for depression or mood disorders ?7. Care team reviewed and updated ?8.  The patient is not on an opioid pain medication  ? ?Patient Care Team: ?Hoyt Koch, MD as PCP - General (Internal Medicine) ?Past Medical History:  ?Diagnosis Date  ? Cirrhosis ([place])   ? Hyperlipemia   ? Hypertension   ? Neuropathy   ? Spleen disease   ? ?Past Surgical History:  ?Procedure Laterality Date  ? BIOPSY  10/20/2020  ? Procedure: BIOPSY;  Surgeon: Irving Copas., MD;  Location: [facility];  Service: Gastroenterology;;  ? COLONOSCOPY WITH PROPOFOL N/A 10/20/2020  ? Procedure: COLONOSCOPY WITH PROPOFOL;  Surgeon: Mansouraty, Telford Nab., MD;  Location: [facility];  Service: Gastroenterology;  Laterality: N/A;  ? ESOPHAGOGASTRODUODENOSCOPY (EGD) WITH PROPOFOL N/A 10/20/2020  ? Procedure: ESOPHAGOGASTRODUODENOSCOPY (EGD) WITH PROPOFOL;  Surgeon: Rush Landmark Telford Nab., MD;  Location: [facility];  Service: Gastroenterology;  Laterality: N/A;  ? INTRAOPERATIVE TRANSTHORACIC ECHOCARDIOGRAM  10/17/2020  ? Procedure: TRANSTHORACIC ECHOCARDIOGRAM;  Surgeon: Fay Records, MD;  Location: [facility];  Service: Cardiovascular;;  ? POLYPECTOMY  10/20/2020  ? Procedure: POLYPECTOMY;  Surgeon: Irving Copas., MD;  Location: [facility];  Service: Gastroenterology;;  ? RADIOFREQUENCY ABLATION  10/20/2020  ? Procedure: RADIO FREQUENCY ABLATION;  Surgeon: Rush Landmark Telford Nab., MD;  Location: [facility];  Service: Gastroenterology;;  ? RECONSTRUCTION OF NOSE    ?  TRACHEOSTOMY    ? TRACHEOSTOMY CLOSURE    ? ?Family History  ?Problem Relation Age of Onset  ? GER disease Mother   ? Heart disease Mother   ? Diabetes Father   ? Hypertension Sister   ? Hypertension Brother   ? Prostate cancer Maternal Grandfather   ? Hypertension Brother   ? Hypertension Brother   ? Hypertension Brother   ? Colon cancer  Neg Hx   ? Esophageal cancer Neg Hx   ? Kidney disease Neg Hx   ? Liver disease Neg Hx   ? Pancreatic cancer Neg Hx   ? Rectal cancer Neg Hx   ? Stomach cancer Neg Hx   ? ?Review of Systems  ?Constitutional:  Positive for fatigue.  ?HENT: Negative.    ?Eyes:  Positive for visual disturbance.  ?     Stable from prior  ?Respiratory:  Negative for cough, chest tightness and shortness of breath.   ?Cardiovascular:  Negative for chest pain, palpitations and leg swelling.  ?Gastrointestinal:  Negative for abdominal distention, abdominal pain, constipation, diarrhea, nausea and vomiting.  ?Musculoskeletal: Negative.   ?Skin: Negative.   ?     itching  ?Neurological: Negative.   ?Psychiatric/Behavioral: Negative.    ? ?Objective:  ?Physical Exam ?Constitutional:   ?   Appearance: She is well-developed.  ?HENT:  ?   Head: Normocephalic and atraumatic.  ?Cardiovascular:  ?   Rate and Rhythm: Normal rate and regular rhythm.  ?Pulmonary:  ?   Effort: Pulmonary effort is normal. No respiratory distress.  ?   Breath sounds: Normal breath sounds. No wheezing or rales.  ?Abdominal:  ?   General: Bowel sounds are normal. There is no distension.  ?   Palpations: Abdomen is soft.  ?   Tenderness: There is no abdominal tenderness. There is no rebound.  ?Musculoskeletal:  ?   Cervical back: Normal range of motion.  ?Skin: ?   General: Skin is warm and dry.  ?Neurological:  ?   Mental Status: She is alert and oriented to person, place, and time.  ?   Coordination: Coordination normal.  ? ? ?Vitals:  ? 11/09/21 1530  ?BP: 130/80  ?Pulse: 93  ?Resp: 18  ?SpO2: 97%  ?Weight: 185 lb (83.9 kg)  ?Height: '5\' 2"'$  (1.575 m)  ? ?This visit occurred during the SARS-CoV-2 public health emergency.  Safety protocols were in place, including screening questions prior to the visit, additional usage of staff PPE, and extensive cleaning of exam room while observing appropriate contact time as indicated for disinfecting solutions.  ? ?Assessment & Plan:   ?Prevnar 20 given at visit ?

## 2021-11-10 ENCOUNTER — Encounter: Payer: Self-pay | Admitting: Internal Medicine

## 2021-11-10 DIAGNOSIS — Z0000 Encounter for general adult medical examination without abnormal findings: Secondary | ICD-10-CM | POA: Insufficient documentation

## 2021-11-10 DIAGNOSIS — Z0001 Encounter for general adult medical examination with abnormal findings: Secondary | ICD-10-CM | POA: Insufficient documentation

## 2021-11-10 DIAGNOSIS — R739 Hyperglycemia, unspecified: Secondary | ICD-10-CM | POA: Insufficient documentation

## 2021-11-10 LAB — LIPID PANEL
Cholesterol: 200 mg/dL (ref 0–200)
HDL: 34.5 mg/dL — ABNORMAL LOW (ref >39.00)
LDL Cholesterol: 134 mg/dL — ABNORMAL HIGH (ref 0–99)
NonHDL: 165.5
Total CHOL/HDL Ratio: 6
Triglycerides: 156 mg/dL — ABNORMAL HIGH (ref 0.0–149.0)
VLDL: 31.2 mg/dL (ref 0.0–40.0)

## 2021-11-10 LAB — COMPREHENSIVE METABOLIC PANEL
ALT: 22 U/L (ref 0–35)
AST: 42 U/L — ABNORMAL HIGH (ref 0–37)
Albumin: 4 g/dL (ref 3.5–5.2)
Alkaline Phosphatase: 155 U/L — ABNORMAL HIGH (ref 39–117)
BUN: 5 mg/dL — ABNORMAL LOW (ref 6–23)
CO2: 31 mEq/L (ref 19–32)
Calcium: 9.3 mg/dL (ref 8.4–10.5)
Chloride: 98 mEq/L (ref 96–112)
Creatinine, Ser: 0.68 mg/dL (ref 0.40–1.20)
GFR: 91.3 mL/min (ref >60.00)
Glucose, Bld: 103 mg/dL — ABNORMAL HIGH (ref 70–99)
Potassium: 4 mEq/L (ref 3.5–5.1)
Sodium: 138 mEq/L (ref 135–145)
Total Bilirubin: 0.7 mg/dL (ref 0.2–1.2)
Total Protein: 8.2 g/dL (ref 6.0–8.3)

## 2021-11-10 LAB — HEMOGLOBIN A1C: Hgb A1c MFr Bld: 6.5 % (ref 4.6–6.5)

## 2021-11-10 NOTE — Assessment & Plan Note (Signed)
Flu shot yearly. Covid-19 counseled. Pneumonia given prevnar 20 today. Shingrix counseled to get at pharmacy. Tetanus counseled to get at pharmacy. Colonoscopy due 2032. Mammogram declines last 2016 so due, pap smear declines due and dexa ordered today. Counseled about sun safety and mole surveillance. Counseled about the dangers of distracted driving. Given 10 year screening recommendations.  ? ?

## 2021-11-10 NOTE — Assessment & Plan Note (Signed)
Checking lipid panel. She has not been taking atorvastatin due to lack of refills so will refill lipitor 10 mg daily. Advised to start taking again.  ?

## 2021-11-10 NOTE — Assessment & Plan Note (Signed)
Advised to quit and she is unable to make attempt today due to husband smoking and not wanting to quit she does not see point to trying. ?

## 2021-11-10 NOTE — Assessment & Plan Note (Signed)
Glucose 313 on recent labs concerning for diabetes especially in the setting of new pruritus. Will check HgA1c and adjust as needed.  ?

## 2021-11-10 NOTE — Assessment & Plan Note (Signed)
Checking CMP and HgA1c as well as CBC. She had glucose of 313 which is suspicious for diabetes on recent labs with GI. Treat as appropriate and is improved some when she stopped a few medication (does not have names today ursodiol is one).  ?

## 2021-11-10 NOTE — Assessment & Plan Note (Signed)
Overall stable without decompensation today. Taking  lasix 40 mg daily and supposed to be taking potassium 20 mEq daily and spironolactone 50 mg daily and she is unsure about if she is taking those.  ?

## 2021-11-10 NOTE — Assessment & Plan Note (Signed)
Checking CMP and adjust as needed. Is supposed to be taking potassium 20 mEq daily and is not currently.  ?

## 2021-11-17 ENCOUNTER — Telehealth: Payer: Self-pay | Admitting: Internal Medicine

## 2021-11-17 DIAGNOSIS — E119 Type 2 diabetes mellitus without complications: Secondary | ICD-10-CM

## 2021-11-17 NOTE — Telephone Encounter (Signed)
Referral placed.

## 2021-11-17 NOTE — Telephone Encounter (Signed)
Pt states at last ov on 11-09-2021 provider was to make a nutrition referral ? ?I was unable to locate referral ? ?Please advise ?

## 2021-11-19 ENCOUNTER — Ambulatory Visit (INDEPENDENT_AMBULATORY_CARE_PROVIDER_SITE_OTHER)
Admission: RE | Admit: 2021-11-19 | Discharge: 2021-11-19 | Disposition: A | Discharge status: Home / Self Care | Payer: PPO | Source: Ambulatory Visit | Attending: Internal Medicine | Admitting: Internal Medicine

## 2021-11-19 DIAGNOSIS — E2839 Other primary ovarian failure: Secondary | ICD-10-CM | POA: Diagnosis not present

## 2021-12-07 DIAGNOSIS — H268 Other specified cataract: Secondary | ICD-10-CM | POA: Diagnosis not present

## 2021-12-07 DIAGNOSIS — H25812 Combined forms of age-related cataract, left eye: Secondary | ICD-10-CM | POA: Diagnosis not present

## 2021-12-07 DIAGNOSIS — E119 Type 2 diabetes mellitus without complications: Secondary | ICD-10-CM | POA: Diagnosis not present

## 2021-12-07 DIAGNOSIS — Z01818 Encounter for other preprocedural examination: Secondary | ICD-10-CM | POA: Diagnosis not present

## 2021-12-25 DIAGNOSIS — E119 Type 2 diabetes mellitus without complications: Secondary | ICD-10-CM | POA: Diagnosis not present

## 2021-12-25 DIAGNOSIS — H268 Other specified cataract: Secondary | ICD-10-CM | POA: Diagnosis not present

## 2021-12-25 DIAGNOSIS — H269 Unspecified cataract: Secondary | ICD-10-CM | POA: Diagnosis not present

## 2022-01-05 ENCOUNTER — Encounter: Payer: PPO | Attending: Internal Medicine | Admitting: Dietician

## 2022-01-05 ENCOUNTER — Encounter: Payer: Self-pay | Admitting: Dietician

## 2022-01-05 DIAGNOSIS — E119 Type 2 diabetes mellitus without complications: Secondary | ICD-10-CM | POA: Diagnosis not present

## 2022-01-05 NOTE — Progress Notes (Signed)
Patient was seen on 01/05/2022 for the first of a series of three diabetes self-management courses at [facility].  Patient Education Plan per assessed needs and concerns is to attend three course education program for Diabetes Self Management Education.  A1C was 6.5 on 11/09/2021.  The following learning objectives were met by the patient during this class: Describe diabetes, types of diabetes and pathophysiology State some common risk factors for diabetes Defines the role of glucose and insulin Describe the relationship between diabetes and cardiovascular and other risks State the members of the Healthcare Team States the rationale for glucose monitoring and when to test State their individual Target Range State the importance of logging glucose readings and how to interpret the readings Identifies A1C target Explain the correlation between A1c and eAG values State symptoms and treatment of high blood glucose and low blood glucose Explain proper technique for glucose testing and identify proper sharps disposal  Handouts given during class include: How to Thrive:  A Guide for Your Journey with Diabetes by the ADA Meal Plan Card and carbohydrate content list Dietary intake form Low Sodium Flavoring Tips Types of Fats Dining Out Label reading Snack list The diabetes portion plate Diabetes Resources A1c to eAG Conversion Chart Blood Glucose Log Diabetes Recommended Care Schedule Support Group Diabetes Success Plan Core Class Satisfaction Survey   Follow-Up Plan: Attend core 2  

## 2022-01-08 DIAGNOSIS — H269 Unspecified cataract: Secondary | ICD-10-CM | POA: Diagnosis not present

## 2022-01-08 DIAGNOSIS — H25812 Combined forms of age-related cataract, left eye: Secondary | ICD-10-CM | POA: Diagnosis not present

## 2022-01-12 ENCOUNTER — Encounter: Payer: PPO | Attending: Internal Medicine | Admitting: Dietician

## 2022-01-12 ENCOUNTER — Encounter: Payer: Self-pay | Admitting: Dietician

## 2022-01-12 DIAGNOSIS — E119 Type 2 diabetes mellitus without complications: Secondary | ICD-10-CM | POA: Insufficient documentation

## 2022-01-26 ENCOUNTER — Encounter: Payer: PPO | Attending: Internal Medicine

## 2022-02-03 ENCOUNTER — Encounter: Payer: Self-pay | Admitting: Gastroenterology

## 2022-02-11 ENCOUNTER — Other Ambulatory Visit: Payer: Self-pay | Admitting: Internal Medicine

## 2022-04-21 IMAGING — CT CT CHEST W/O CM
2 of 4 series · 14 of 36 positions shown, 17 images · non-contrast
Comparison: Chest CTA 08/28/2020.

CLINICAL DATA: 64-year-old female with history of pulmonary nodule.
Followup study.

EXAM:
CT CHEST WITHOUT CONTRAST
TECHNIQUE: Multidetector CT imaging of the chest was performed following the
standard protocol without IV contrast.

[Series 2: thorax · axial · 0.71mm/px · z∈[+1318,+1560]mm · 11 of 144 slices shown, 14 images]
[im 12/144  mediastinal]
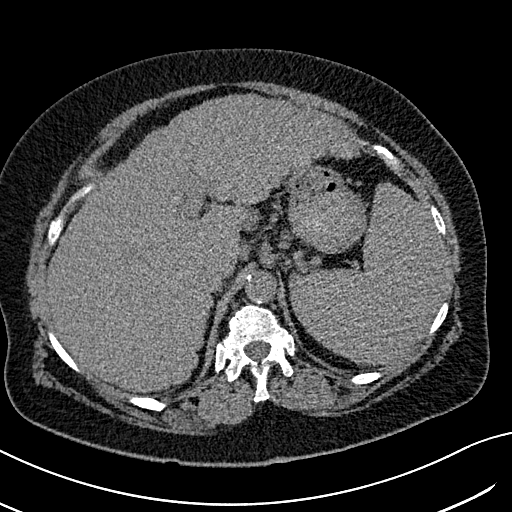
[im 12/144  lung]
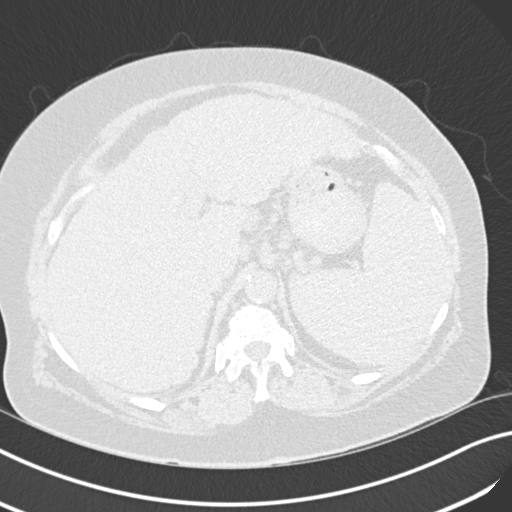
[im 23/144  lung]
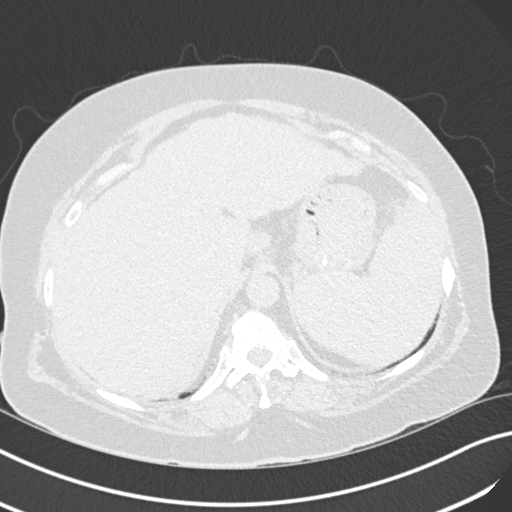
[im 34/144  lung]
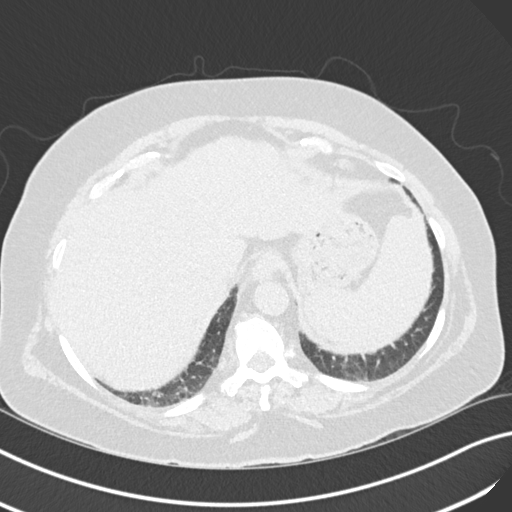
[im 45/144  lung]
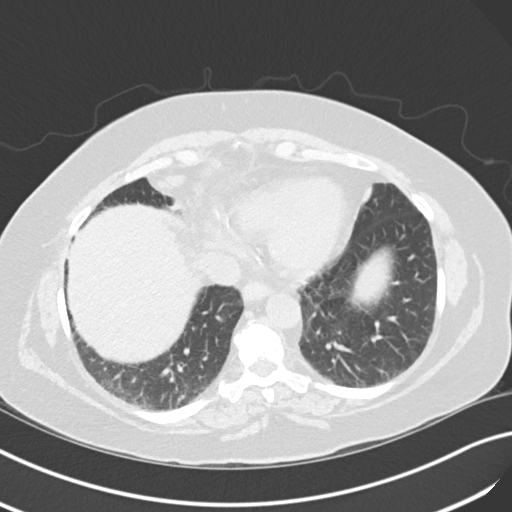
[im 56/144  mediastinal]
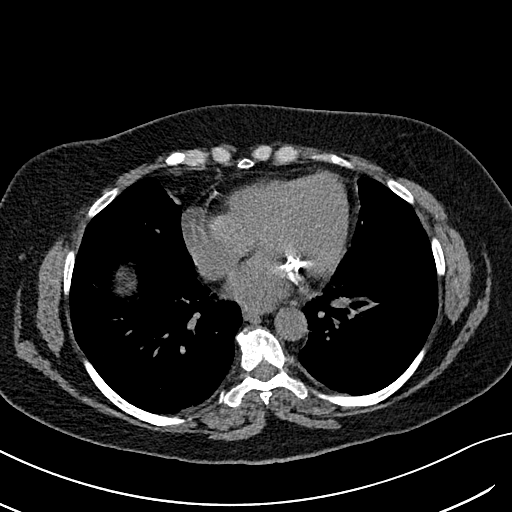
[im 56/144  lung]
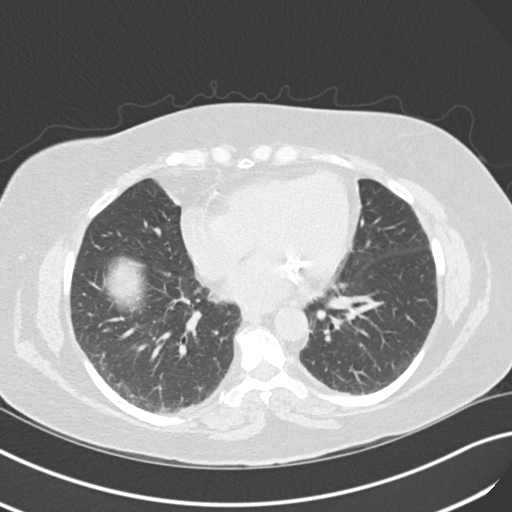
[im 78/144  lung]
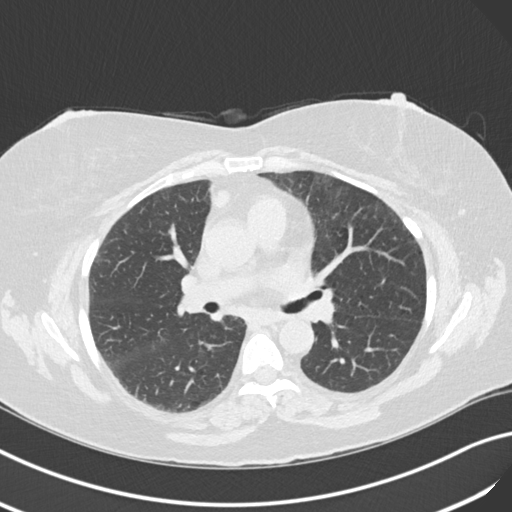
[im 89/144  lung]
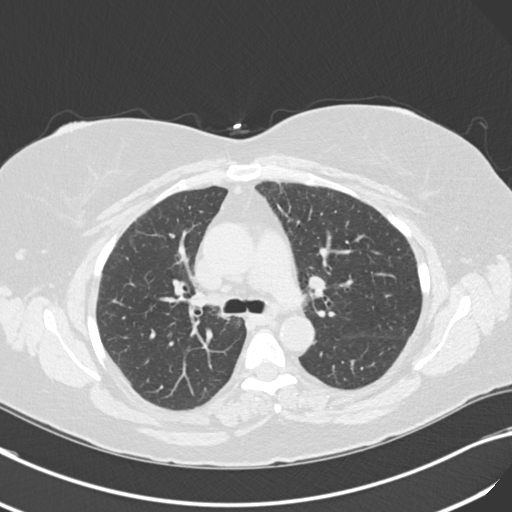
[im 100/144  lung]
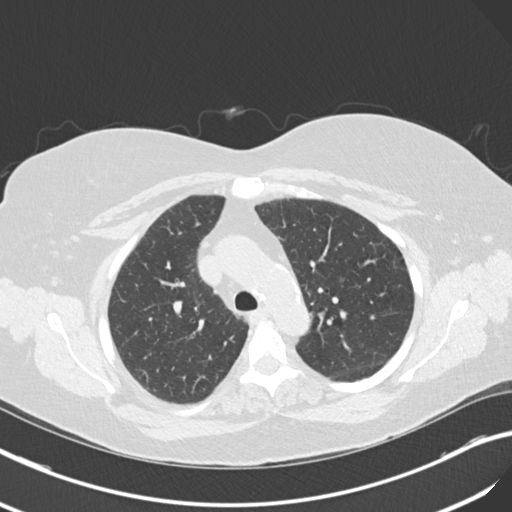
[im 111/144  mediastinal]
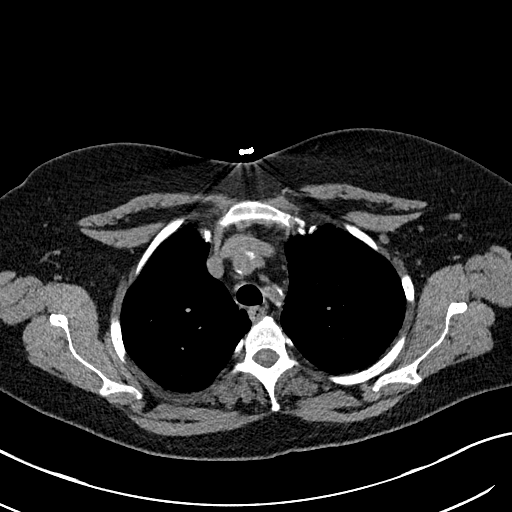
[im 111/144  lung]
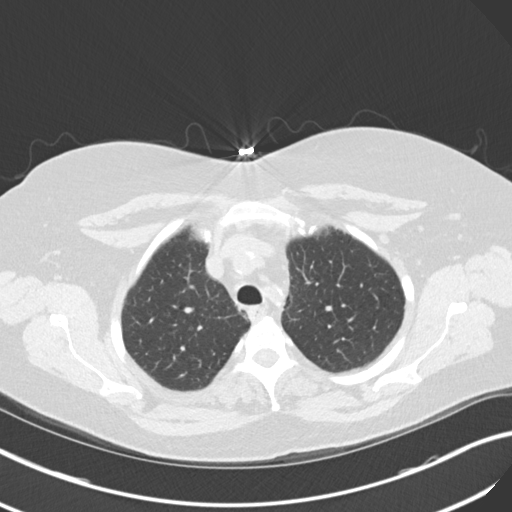
[im 122/144  lung]
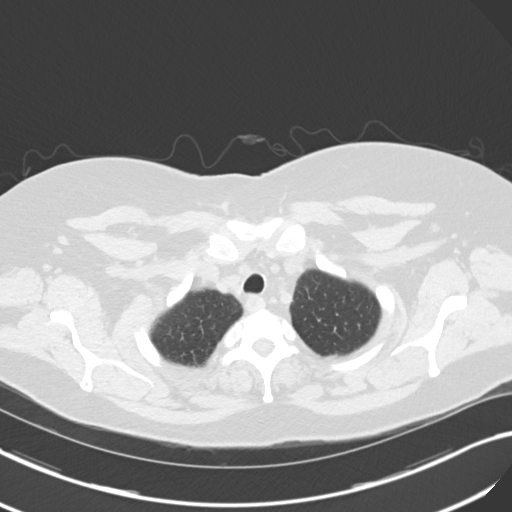
[im 133/144  lung]
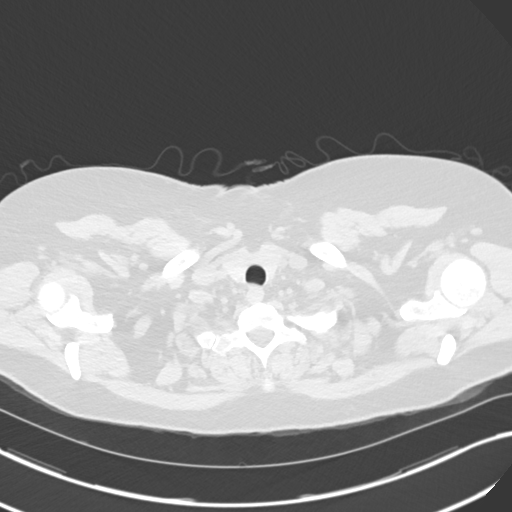

[Series 5: coronal · coronal · 0.59mm/px · 3 of 151 slices shown]
[im 31/151  lung]
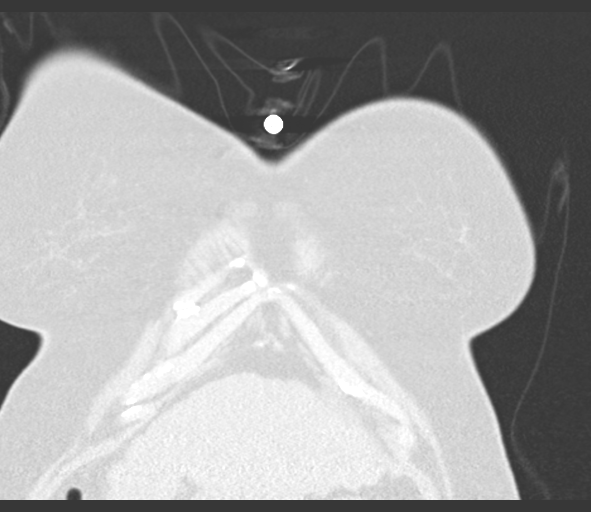
[im 61/151  lung]
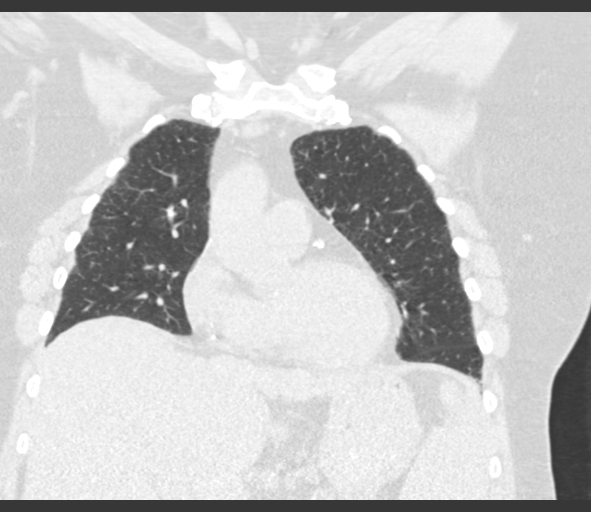
[im 91/151  lung]
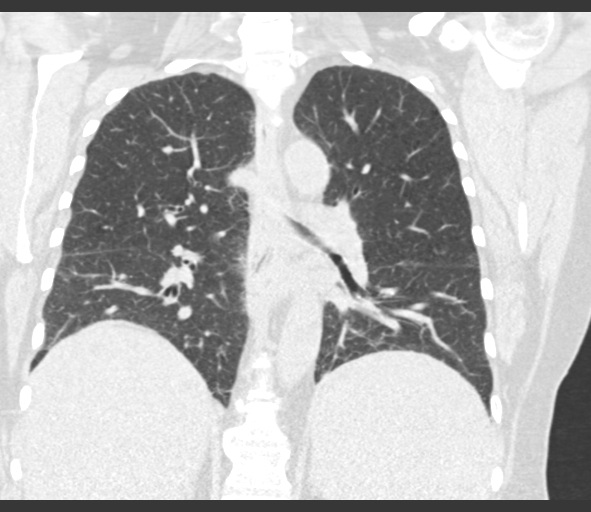

[14 of 36 positions shown; findings below may reference images not displayed]

FINDINGS: Cardiovascular: Heart size is normal. There is no significant
pericardial fluid, thickening or pericardial calcification. There is
aortic atherosclerosis, as well as atherosclerosis of the great
vessels of the mediastinum and the coronary arteries, including
calcified atherosclerotic plaque in the left main, left anterior
descending, left circumflex and right coronary arteries. Mild
calcifications of the aortic valve. Severe calcifications of the
mitral annulus.

Mediastinum/Nodes: As with the prior study there are several
prominent borderline enlarged prevascular lymph nodes, juxta
pericardiac lymph nodes and hilar lymph nodes which appear grossly
stable compared to the prior study (nonspecific, but presumably
benign). Esophagus is unremarkable in appearance. No axillary
lymphadenopathy.

Lungs/Pleura: 5 mm nodule in the superior segment of the right lower
lobe associated with the major fissure (axial image 60 of series 3),
stable compared to the prior study. Several other small right-sided
pulmonary nodules measuring 3 mm or less in size are also unchanged.
No suspicious pulmonary nodules or masses otherwise noted. No acute
consolidative airspace disease. No pleural effusions. Very mild
paraseptal emphysema.

Upper Abdomen: Liver has a shrunken appearance and nodular contour,
indicative of advanced cirrhosis. Spleen is incompletely imaged, but
appears likely enlarged.

Musculoskeletal: There are no aggressive appearing lytic or blastic
lesions noted in the visualized portions of the skeleton.
IMPRESSION: 1. Small pulmonary nodules measuring 5 mm or less in size are stable
compared to the prior study, nonspecific but statistically likely
benign. No follow-up needed if patient is low-risk (and has no known
or suspected primary neoplasm). Non-contrast chest CT can be
considered in 12 months if patient is high-risk. This recommendation
follows the consensus statement: Guidelines for Management of
Incidental Pulmonary Nodules Detected on CT Images: From the
2. Aortic atherosclerosis, in addition to left main and 3 vessel
coronary artery disease. Please note that although the presence of
coronary artery calcium documents the presence of coronary artery
disease, the severity of this disease and any potential stenosis
cannot be assessed on this non-gated CT examination. Assessment for
potential risk factor modification, dietary therapy or pharmacologic
therapy may be warranted, if clinically indicated.
3. There are calcifications of the aortic valve (mild) and mitral
annulus (severe). Echocardiographic correlation for evaluation of
potential valvular dysfunction may be warranted if clinically
indicated.
4. Mild paraseptal emphysema.
5. Advanced cirrhosis.
6. Probable splenomegaly.

Aortic Atherosclerosis (FA61G-G1O.O) and Emphysema (FA61G-QDG.J).

## 2022-10-18 ENCOUNTER — Other Ambulatory Visit: Payer: Self-pay | Admitting: Internal Medicine

## 2022-10-29 ENCOUNTER — Other Ambulatory Visit: Payer: Self-pay | Admitting: Internal Medicine

## 2022-11-04 ENCOUNTER — Telehealth: Payer: Self-pay

## 2022-11-04 NOTE — Telephone Encounter (Signed)
Called patient to schedule Medicare Annual Wellness Visit (AWV). No voicemail available to leave a message.  Last date of AWV: 11/10/22  Please schedule an appointment at any time on Annual Wellness Visit schedule.

## 2022-11-29 ENCOUNTER — Other Ambulatory Visit: Payer: Self-pay | Admitting: Internal Medicine

## 2022-12-07 ENCOUNTER — Ambulatory Visit (INDEPENDENT_AMBULATORY_CARE_PROVIDER_SITE_OTHER): Payer: PPO | Admitting: Internal Medicine

## 2022-12-07 ENCOUNTER — Encounter: Payer: Self-pay | Admitting: Internal Medicine

## 2022-12-07 VITALS — BP 120/80 | HR 93 | Temp 97.8°F | Ht 62.0 in | Wt 190.0 lb

## 2022-12-07 DIAGNOSIS — I700 Atherosclerosis of aorta: Secondary | ICD-10-CM

## 2022-12-07 DIAGNOSIS — F5101 Primary insomnia: Secondary | ICD-10-CM | POA: Diagnosis not present

## 2022-12-07 DIAGNOSIS — Z0000 Encounter for general adult medical examination without abnormal findings: Secondary | ICD-10-CM | POA: Diagnosis not present

## 2022-12-07 DIAGNOSIS — E118 Type 2 diabetes mellitus with unspecified complications: Secondary | ICD-10-CM | POA: Diagnosis not present

## 2022-12-07 DIAGNOSIS — G629 Polyneuropathy, unspecified: Secondary | ICD-10-CM | POA: Diagnosis not present

## 2022-12-07 DIAGNOSIS — Z720 Tobacco use: Secondary | ICD-10-CM

## 2022-12-07 DIAGNOSIS — E119 Type 2 diabetes mellitus without complications: Secondary | ICD-10-CM | POA: Insufficient documentation

## 2022-12-07 DIAGNOSIS — I8510 Secondary esophageal varices without bleeding: Secondary | ICD-10-CM | POA: Diagnosis not present

## 2022-12-07 DIAGNOSIS — D500 Iron deficiency anemia secondary to blood loss (chronic): Secondary | ICD-10-CM

## 2022-12-07 DIAGNOSIS — R7303 Prediabetes: Secondary | ICD-10-CM | POA: Insufficient documentation

## 2022-12-07 DIAGNOSIS — E1169 Type 2 diabetes mellitus with other specified complication: Secondary | ICD-10-CM | POA: Diagnosis not present

## 2022-12-07 DIAGNOSIS — E785 Hyperlipidemia, unspecified: Secondary | ICD-10-CM | POA: Diagnosis not present

## 2022-12-07 DIAGNOSIS — G4700 Insomnia, unspecified: Secondary | ICD-10-CM | POA: Insufficient documentation

## 2022-12-07 DIAGNOSIS — K743 Primary biliary cirrhosis: Secondary | ICD-10-CM | POA: Diagnosis not present

## 2022-12-07 DIAGNOSIS — R911 Solitary pulmonary nodule: Secondary | ICD-10-CM

## 2022-12-07 LAB — BASIC METABOLIC PANEL
BUN: 8 mg/dL (ref 6–23)
CO2: 30 mEq/L (ref 19–32)
Calcium: 9.2 mg/dL (ref 8.4–10.5)
Chloride: 98 mEq/L (ref 96–112)
Creatinine, Ser: 0.62 mg/dL (ref 0.40–1.20)
GFR: 92.65 mL/min (ref >60.00)
Glucose, Bld: 174 mg/dL — ABNORMAL HIGH (ref 70–99)
Potassium: 3.4 mEq/L — ABNORMAL LOW (ref 3.5–5.1)
Sodium: 136 mEq/L (ref 135–145)

## 2022-12-07 LAB — CBC
HCT: 40.3 % (ref 36.0–46.0)
Hemoglobin: 12.7 g/dL (ref 12.0–15.0)
MCHC: 31.6 g/dL (ref 30.0–36.0)
MCV: 80.9 fl (ref 78.0–100.0)
Platelets: 135 10*3/uL — ABNORMAL LOW (ref 150.0–400.0)
RBC: 4.98 Mil/uL (ref 3.87–5.11)
RDW: 19.2 % — ABNORMAL HIGH (ref 11.5–15.5)
WBC: 6.5 10*3/uL (ref 4.0–10.5)

## 2022-12-07 LAB — LIPID PANEL
Cholesterol: 135 mg/dL (ref 0–200)
HDL: 41.6 mg/dL (ref >39.00)
LDL Cholesterol: 66 mg/dL (ref 0–99)
NonHDL: 93.71
Total CHOL/HDL Ratio: 3
Triglycerides: 137 mg/dL (ref 0.0–149.0)
VLDL: 27.4 mg/dL (ref 0.0–40.0)

## 2022-12-07 LAB — HEPATIC FUNCTION PANEL
ALT: 26 U/L (ref 0–35)
AST: 39 U/L — ABNORMAL HIGH (ref 0–37)
Albumin: 3.9 g/dL (ref 3.5–5.2)
Alkaline Phosphatase: 143 U/L — ABNORMAL HIGH (ref 39–117)
Bilirubin, Direct: 0.1 mg/dL (ref 0.0–0.3)
Total Bilirubin: 0.5 mg/dL (ref 0.2–1.2)
Total Protein: 8.4 g/dL — ABNORMAL HIGH (ref 6.0–8.3)

## 2022-12-07 LAB — MICROALBUMIN / CREATININE URINE RATIO
Creatinine,U: 22.2 mg/dL
Microalb Creat Ratio: 3.2 mg/g (ref 0.0–30.0)
Microalb, Ur: <0.7 mg/dL (ref 0.0–1.9)

## 2022-12-07 MED ORDER — ATORVASTATIN CALCIUM 10 MG PO TABS: 10.0000 mg | ORAL_TABLET | Freq: Every day | ORAL | 3 refills | Status: DC

## 2022-12-07 MED ORDER — TRAZODONE HCL 50 MG PO TABS
50.0000 mg | ORAL_TABLET | Freq: Every day | ORAL | 1 refills | Status: DC
Start: 2022-12-07 — End: 2023-06-03

## 2022-12-07 MED ORDER — GABAPENTIN 400 MG PO CAPS: ORAL_CAPSULE | ORAL | 3 refills | Status: DC

## 2022-12-07 NOTE — Assessment & Plan Note (Signed)
Has not seen GI in 2 years. Asked her to return. Checking hepatic function and Pt/INR and AFP. No ascites on exam today.

## 2022-12-07 NOTE — Patient Instructions (Signed)
We have sent in trazodone to try at night time for sleep.  We will get the labs today and the CT scan to check the nodule.

## 2022-12-07 NOTE — Assessment & Plan Note (Signed)
Checking lipid panel and adjust lipitor 40 mg daily as needed.  

## 2022-12-07 NOTE — Assessment & Plan Note (Signed)
Flu shot yearly. Pneumonia complete. Shingrix due at pharmacy. Tetanus due at pharmacy. Colonoscopy due 2032. Mammogram due 2025, pap smear aged out and dexa due 2026. Counseled about sun safety and mole surveillance. Counseled about the dangers of distracted driving. Given 10 year screening recommendations.

## 2022-12-07 NOTE — Assessment & Plan Note (Signed)
Using gabapentin 400 mg TID prn for pain and overall worsening. Checking HgA1c as she had new diabetes last year.

## 2022-12-07 NOTE — Assessment & Plan Note (Signed)
Checking CBC, no clinical signs of bleeding.

## 2022-12-07 NOTE — Assessment & Plan Note (Signed)
Asked to quit smoking and is unable at this time.

## 2022-12-07 NOTE — Assessment & Plan Note (Signed)
No follow up recently and not on beta blocker. Asked her to return to GI as she is likely due for EGD follow up.

## 2022-12-07 NOTE — Assessment & Plan Note (Signed)
With new insomnia and she is using gabapentin extra to self treat. We will rx trazodone 50 mg qhs and titrate dosing if needed. Counseled about need to not increase gabapentin as she is close to safe limit for single dosing with 800 mg at bedtime (she was taking 3 herself).

## 2022-12-07 NOTE — Assessment & Plan Note (Signed)
Overdue for repeat CT scan as she is a current smoker and high risk for lung cancer on her 5 mm nodule. Ordered CT chest without contrast today.

## 2022-12-07 NOTE — Assessment & Plan Note (Signed)
Checking HgA1c, lipid panel, microalbumin to creatinine ratio and CMP. She is not on medication and did not follow up in timeline recommended for assessment. Taking statin. Adjust as needed.

## 2022-12-07 NOTE — Assessment & Plan Note (Signed)
Has not seen GI in some time and is not taking ursediol recently. Asked her to return to GI. Checking LFTs.

## 2022-12-07 NOTE — Assessment & Plan Note (Signed)
Taking lipitor 40 mg daily and checking lipid panel and adjust as needed.  

## 2022-12-07 NOTE — Progress Notes (Signed)
Subjective:   Patient ID: Sierra Logan, female    DOB: [birth year], 67 y.o.   MRN: [id number]  HPI Here for medicare wellness and physical, with new complaints. Please see A/P for status and treatment of chronic medical problems.   Diet: DM since diabetic Physical activity: sedentary Depression/mood screen: negative Hearing: intact to whispered voice, mild loss bilaterally Visual acuity: grossly normal, performs annual eye exam  ADLs: capable Fall risk: none Home safety: good Cognitive evaluation: intact to orientation, naming, recall and repetition EOL planning: adv directives discussed  Flowsheet Row Office Visit from 12/07/2022 in [facility] [place]  PHQ-2 Total Score 0       Flowsheet Row Office Visit from 12/07/2022 in [facility] [place]  PHQ-9 Total Score 6         12/04/2020    2:52 PM 12/11/2020    8:46 AM 11/09/2021    3:54 PM 01/05/2022    3:43 PM 12/07/2022    2:35 PM  Fall Risk  Falls in the past year?   0 0 1  Was there an injury with Fall?     0  Fall Risk Category Calculator     1  (RETIRED) Patient Fall Risk Level Low fall risk Moderate fall risk Low fall risk    Fall risk Follow up     Falls evaluation completed    I have personally reviewed and have noted 1. The patient's medical and social history - reviewed today no changes 2. Their use of alcohol, tobacco or illicit drugs 3. Their current medications and supplements 4. The patient's functional ability including ADL's, fall risks, home safety risks and hearing or visual impairment. 5. Diet and physical activities 6. Evidence for depression or mood disorders 7. Care team reviewed and updated 8.  The patient is not on an opioid pain medication and this was reviewed with patient.  Patient Care Team: Myrlene Broker, MD as PCP - General (Internal Medicine) Past Medical History:  Diagnosis Date   Cirrhosis [facility])    Diabetes mellitus  without complication (HCC)    Hyperlipemia    Hypertension    Neuropathy    Spleen disease    Past Surgical History:  Procedure Laterality Date   BIOPSY  10/20/2020   Procedure: BIOPSY;  Surgeon: Lemar Lofty., MD;  Location: [facility];  Service: Gastroenterology;;   COLONOSCOPY WITH PROPOFOL N/A 10/20/2020   Procedure: COLONOSCOPY WITH PROPOFOL;  Surgeon: Lemar Lofty., MD;  Location: [facility];  Service: Gastroenterology;  Laterality: N/A;   ESOPHAGOGASTRODUODENOSCOPY (EGD) WITH PROPOFOL N/A 10/20/2020   Procedure: ESOPHAGOGASTRODUODENOSCOPY (EGD) WITH PROPOFOL;  Surgeon: Meridee Score Netty Starring., MD;  Location: [facility];  Service: Gastroenterology;  Laterality: N/A;   INTRAOPERATIVE TRANSTHORACIC ECHOCARDIOGRAM  10/17/2020   Procedure: TRANSTHORACIC ECHOCARDIOGRAM;  Surgeon: Pricilla Riffle, MD;  Location: [facility] END[facility]COPY;  Service: Cardiovascular;;   POLYPECTOMY  10/20/2020   Procedure: POLYPECTOMY;  Surgeon: Lemar Lofty., MD;  Location: [facility];  Service: Gastroenterology;;   RADIOFREQUENCY ABLATION  10/20/2020   Procedure: RADIO FREQUENCY ABLATION;  Surgeon: Lemar Lofty., MD;  Location: [facility];  Service: Gastroenterology;;   RECONSTRUCTION OF NOSE     TRACHEOSTOMY     TRACHEOSTOMY CLOSURE     Family History  Problem Relation Age of Onset   GER disease Mother    Heart disease Mother    Diabetes Father    Hypertension Sister    Hypertension Brother  Prostate cancer Maternal Grandfather    Hypertension Brother    Hypertension Brother    Hypertension Brother    Colon cancer Neg Hx    Esophageal cancer Neg Hx    Kidney disease Neg Hx    Liver disease Neg Hx    Pancreatic cancer Neg Hx    Rectal cancer Neg Hx    Stomach cancer Neg Hx    Review of Systems  Constitutional: Negative.   HENT: Negative.    Eyes: Negative.   Respiratory:  Negative for cough, chest tightness and shortness of breath.   Cardiovascular:  Negative  for chest pain, palpitations and leg swelling.  Gastrointestinal:  Negative for abdominal distention, abdominal pain, constipation, diarrhea, nausea and vomiting.  Musculoskeletal: Negative.   Skin: Negative.        itching  Neurological: Negative.   Psychiatric/Behavioral:  Positive for sleep disturbance.     Objective:  Physical Exam Constitutional:      Appearance: She is well-developed.  HENT:     Head: Normocephalic and atraumatic.  Cardiovascular:     Rate and Rhythm: Normal rate and regular rhythm.  Pulmonary:     Effort: Pulmonary effort is normal. No respiratory distress.     Breath sounds: Normal breath sounds. No wheezing or rales.  Abdominal:     General: Bowel sounds are normal. There is distension.     Palpations: Abdomen is soft.     Tenderness: There is no abdominal tenderness. There is no rebound.     Comments: No ascites detected  Musculoskeletal:     Cervical back: Normal range of motion.  Skin:    General: Skin is warm and dry.     Comments: Foot exam done  Neurological:     Mental Status: She is alert and oriented to person, place, and time.     Coordination: Coordination normal.     Vitals:   12/07/22 1431  BP: 120/80  Pulse: 93  Temp: 97.8 F (36.6 C)  TempSrc: Oral  SpO2: 93%  Weight: 190 lb (86.2 kg)  Height: 5\' 2"  (1.575 m)    Assessment & Plan:

## 2022-12-08 LAB — HEMOGLOBIN A1C: Hgb A1c MFr Bld: 5.9 % (ref 4.6–6.5)

## 2022-12-09 ENCOUNTER — Telehealth: Payer: Self-pay | Admitting: Internal Medicine

## 2022-12-09 ENCOUNTER — Other Ambulatory Visit: Payer: PPO

## 2022-12-09 ENCOUNTER — Other Ambulatory Visit: Payer: Self-pay

## 2022-12-09 DIAGNOSIS — K743 Primary biliary cirrhosis: Secondary | ICD-10-CM

## 2022-12-09 LAB — PROTIME-INR
INR: 1.2 ratio — ABNORMAL HIGH (ref 0.8–1.0)
Prothrombin Time: 13.1 s (ref 9.6–13.1)

## 2022-12-09 LAB — AFP TUMOR MARKER: AFP-Tumor Marker: 4.6 ng/mL

## 2022-12-09 NOTE — Telephone Encounter (Signed)
[facility] called today stating she need the orders released to future so she can see the pt.

## 2022-12-09 NOTE — Telephone Encounter (Signed)
Changed to future

## 2023-03-16 DIAGNOSIS — E119 Type 2 diabetes mellitus without complications: Secondary | ICD-10-CM | POA: Diagnosis not present

## 2023-03-16 LAB — HM DIABETES EYE EXAM

## 2023-04-16 ENCOUNTER — Other Ambulatory Visit: Payer: Self-pay | Admitting: Internal Medicine

## 2023-06-03 ENCOUNTER — Other Ambulatory Visit: Payer: Self-pay | Admitting: Internal Medicine

## 2023-08-03 ENCOUNTER — Other Ambulatory Visit: Payer: Self-pay | Admitting: Internal Medicine

## 2023-08-03 ENCOUNTER — Telehealth: Payer: Self-pay | Admitting: Internal Medicine

## 2023-08-03 NOTE — Telephone Encounter (Signed)
 Copied from CRM [id number]. Topic: Clinical - Medication Refill >> Aug 03, 2023 10:51 AM Yolande Hench C wrote: Most Recent Primary Care Visit:  Provider: Bambi Lever A  Department: [facility]  Visit Type: OFFICE VISIT  Date: 12/07/2022  Medication: gabapentin  (NEURONTIN ) 400 MG capsule  Has the patient contacted their pharmacy? No, patient contacted provider to initiate refill. (Agent: If no, request that the patient contact the pharmacy for the refill. If patient does not wish to contact the pharmacy document the reason why and proceed with request.) (Agent: If yes, when and what did the pharmacy advise?)  Is this the correct pharmacy for this prescription? Yes If no, delete pharmacy and type the correct one.  This is the patient's preferred pharmacy:   [id number]- [address] [place], [place] [address] [phone] [phone]   Has the prescription been filled recently? No  Is the patient out of the medication? Yes  Has the patient been seen for an appointment in the last year OR does the patient have an upcoming appointment?   Can we respond through MyChart?   Agent: Please be advised that Rx refills may take up to 3 business days. We ask that you follow-up with your pharmacy.

## 2023-08-03 NOTE — Telephone Encounter (Signed)
Copied from CRM [id number]. Topic: Clinical - Medication Question >> Aug 03, 2023 10:58 AM Sierra Logan wrote: Reason for CRM: Patient has asked if the provider can prescribe her a stronger sleeping pill. Please follow up with patient [phone]

## 2023-08-03 NOTE — Telephone Encounter (Deleted)
 Copied from CRM [id number]. Topic: Clinical - Medication Refill >> Aug 03, 2023 10:44 AM Yolande Hench C wrote: Most Recent Primary Care Visit:  Provider: Bambi Lever A  Department: [facility]  Visit Type: OFFICE VISIT  Date: 12/07/2022  Medication: ***  Has the patient contacted their pharmacy? {yes/no:20286} (Agent: If no, request that the patient contact the pharmacy for the refill. If patient does not wish to contact the pharmacy document the reason why and proceed with request.) (Agent: If yes, when and what did the pharmacy advise?)  Is this the correct pharmacy for this prescription? {yes/no:20286} If no, delete pharmacy and type the correct one.  This is the patient's preferred pharmacy:  [facility] [address] [place] - [address] AT [facility] [address] [place] [address] Phone: [phone] Fax: [phone]   Has the prescription been filled recently? {yes/no:20286}  Is the patient out of the medication? {yes/no:20286}  Has the patient been seen for an appointment in the last year OR does the patient have an upcoming appointment? {yes/no:20286}  Can we respond through MyChart? {yes/no:20286}  Agent: Please be advised that Rx refills may take up to 3 business days. We ask that you follow-up with your pharmacy.

## 2023-08-04 NOTE — Telephone Encounter (Signed)
Would need visit to discuss.  

## 2023-10-26 ENCOUNTER — Ambulatory Visit: Payer: Self-pay

## 2023-10-26 NOTE — Telephone Encounter (Signed)
 Chief Complaint: Bilateral Leg Swelling Symptoms: SOB with exertion Frequency: x 3 weeks Pertinent Negatives: Patient denies CP, fever,  Disposition: [] ED /[] Urgent Care (no appt availability in office) / [x] Appointment(In office/virtual)/ []  [facility]/ [] Home Care/ [] Refused Recommended Disposition /[] [place] [facility]/ []  Follow-up with PCP Additional Notes: Pt reports she has been experiencing bilateral lower leg swelling  x 3 weeks. Pt denies CP, reports some SOB with exertion that she notes has improved since she quit smoking 4 days ago. Pt notes mild redness at ankles, denies fever, streaking, warmth. OV scheduled. This RN educated pt on home care, new-worsening symptoms, when to call back/seek emergent care. Pt verbalized understanding and agrees to plan.    Copied from CRM [id number]. Topic: Clinical - Red Word Triage >> Oct 26, 2023  2:34 PM Pascal Lux wrote: Red Word that prompted transfer to Nurse Triage: Patient stated swelling in ankle, legs and feet. Reason for Disposition  [1] MODERATE leg swelling (e.g., swelling extends up to knees) AND [2] new-onset or worsening  Answer Assessment - Initial Assessment Questions 1. ONSET: "When did the swelling start?" (e.g., minutes, hours, days)     X 3 weeks 2. LOCATION: "What part of the leg is swollen?"  "Are both legs swollen or just one leg?"     Knees to feet 3. SEVERITY: "How bad is the swelling?" (e.g., localized; mild, moderate, severe)   - Localized: Small area of swelling localized to one leg.   - MILD pedal edema: Swelling limited to foot and ankle, pitting edema < 1/4 inch (6 mm) deep, rest and elevation eliminate most or all swelling.   - MODERATE edema: Swelling of lower leg to knee, pitting edema > 1/4 inch (6 mm) deep, rest and elevation only partially reduce swelling.   - SEVERE edema: Swelling extends above knee, facial or hand swelling present.      Moderate 4. REDNESS: "Does the swelling look red or  infected?"     Redness at bottom of legs 5. PAIN: "Is the swelling painful to touch?" If Yes, ask: "How painful is it?"   (Scale 1-10; mild, moderate or severe)     None, painful with ambulation 6. FEVER: "Do you have a fever?" If Yes, ask: "What is it, how was it measured, and when did it start?"      None 7. CAUSE: "What do you think is causing the leg swelling?"     Unknown 8. MEDICAL HISTORY: "Do you have a history of blood clots (e.g., DVT), cancer, heart failure, kidney disease, or liver failure?"     None 9. RECURRENT SYMPTOM: "Have you had leg swelling before?" If Yes, ask: "When was the last time?" "What happened that time?"     Yes, pt states she is a smoker 10. OTHER SYMPTOMS: "Do you have any other symptoms?" (e.g., chest pain, difficulty breathing)       SOB with position changes, exertion  Protocols used: Leg Swelling and Edema-A-AH

## 2023-10-27 ENCOUNTER — Ambulatory Visit: Admitting: Emergency Medicine

## 2023-10-27 ENCOUNTER — Encounter: Payer: Self-pay | Admitting: Emergency Medicine

## 2023-10-27 VITALS — BP 110/70 | HR 90 | Temp 98.3°F | Ht 62.0 in | Wt 191.0 lb

## 2023-10-27 DIAGNOSIS — K743 Primary biliary cirrhosis: Secondary | ICD-10-CM

## 2023-10-27 LAB — COMPREHENSIVE METABOLIC PANEL WITH GFR
ALT: 16 U/L (ref 0–35)
AST: 33 U/L (ref 0–37)
Albumin: 2.7 g/dL — ABNORMAL LOW (ref 3.5–5.2)
Alkaline Phosphatase: 123 U/L — ABNORMAL HIGH (ref 39–117)
BUN: 8 mg/dL (ref 6–23)
CO2: 34 meq/L — ABNORMAL HIGH (ref 19–32)
Calcium: 8.3 mg/dL — ABNORMAL LOW (ref 8.4–10.5)
Chloride: 98 meq/L (ref 96–112)
Creatinine, Ser: 0.58 mg/dL (ref 0.40–1.20)
GFR: 93.57 mL/min (ref >60.00)
Glucose, Bld: 81 mg/dL (ref 70–99)
Potassium: 3.6 meq/L (ref 3.5–5.1)
Sodium: 137 meq/L (ref 135–145)
Total Bilirubin: 0.7 mg/dL (ref 0.2–1.2)
Total Protein: 6.7 g/dL (ref 6.0–8.3)

## 2023-10-27 LAB — CBC WITH DIFFERENTIAL/PLATELET
Basophils Absolute: 0 10*3/uL (ref 0.0–0.1)
Basophils Relative: 0.4 % (ref 0.0–3.0)
Eosinophils Absolute: 0 10*3/uL (ref 0.0–0.7)
Eosinophils Relative: 0.4 % (ref 0.0–5.0)
HCT: 38.4 % (ref 36.0–46.0)
Hemoglobin: 12.1 g/dL (ref 12.0–15.0)
Lymphocytes Relative: 25.8 % (ref 12.0–46.0)
Lymphs Abs: 1.6 10*3/uL (ref 0.7–4.0)
MCHC: 31.6 g/dL (ref 30.0–36.0)
MCV: 82.5 fl (ref 78.0–100.0)
Monocytes Absolute: 0.6 10*3/uL (ref 0.1–1.0)
Monocytes Relative: 9 % (ref 3.0–12.0)
Neutro Abs: 4 10*3/uL (ref 1.4–7.7)
Neutrophils Relative %: 64.4 % (ref 43.0–77.0)
Platelets: 184 10*3/uL (ref 150.0–400.0)
RBC: 4.65 Mil/uL (ref 3.87–5.11)
RDW: 18.6 % — ABNORMAL HIGH (ref 11.5–15.5)
WBC: 6.2 10*3/uL (ref 4.0–10.5)

## 2023-10-27 LAB — HEMOGLOBIN A1C: Hgb A1c MFr Bld: 4.8 % (ref 4.6–6.5)

## 2023-10-27 MED ORDER — SPIRONOLACTONE 25 MG PO TABS: 50.0000 mg | ORAL_TABLET | Freq: Every day | ORAL | 0 refills | Status: DC

## 2023-10-27 NOTE — Progress Notes (Signed)
 Sierra Logan 68 y.o.   Chief Complaint  Patient presents with   Leg Swelling    Patient states both of legs has been swelling for about 2 weeks. She also can't tell if her stomach if swelling but it does hurt to touch as well as both her legs she says.     HISTORY OF PRESENT ILLNESS: Acute problem visit today.  Patient of Dr. Hillard Danker This is a 68 y.o. female complaining of bilateral leg swelling for the past couple of weeks Patient has a history of liver cirrhosis among other problems. Not sure about compliance with medications.  Not taking diuretics at present time No other complaints or medical concerns today.  HPI   Prior to Admission medications   Medication Sig Start Date End Date Taking? Authorizing Provider  acetaminophen (TYLENOL) 500 MG tablet Take 1,000 mg by mouth every 8 (eight) hours as needed for moderate pain or headache.    [provider]  atorvastatin (LIPITOR) 10 MG tablet Take 1 tablet (10 mg total) by mouth at bedtime. 12/07/22   Myrlene Broker, MD  BIOTIN PO Take 1 tablet by mouth daily with breakfast.    [provider]  Calcium Carb-Cholecalciferol (CALCIUM+D3 PO) Take 1 tablet by mouth daily after breakfast.    [provider]  furosemide (LASIX) 40 MG tablet Take 1 tablet (40 mg total) by mouth daily. 11/09/21   Myrlene Broker, MD  gabapentin (NEURONTIN) 400 MG capsule TAKE 1 CAPSULE EVERY MORNING AND 2 AT BEDTIME 04/18/23   Myrlene Broker, MD  nicotine (NICODERM CQ - DOSED IN MG/24 HOURS) 21 mg/24hr patch Place 21 mg onto the skin daily.    [provider]  Omega-3 Fatty Acids (FISH OIL PO) Take 1 capsule by mouth daily with breakfast.    [provider]  omeprazole (PRILOSEC) 40 MG capsule Take 1 capsule (40 mg total) by mouth daily. 11/09/21   Myrlene Broker, MD  potassium chloride SA (KLOR-CON) 20 MEQ tablet Take 1 tablet (20 mEq total) by mouth daily. Patient not taking:  Reported on 11/09/2021 01/16/21   Myrlene Broker, MD  spironolactone (ALDACTONE) 25 MG tablet Take 2 tablets (50 mg total) by mouth daily. Patient not taking: Reported on 11/09/2021 12/17/20 12/17/21  Mansouraty, Netty Starring., MD  sucralfate (CARAFATE) 1 GM/10ML suspension SHAKE LIQUID AND TAKE 10 ML(1 GRAM) BY MOUTH TWICE DAILY 02/19/21   Jaci Standard, MD  traZODone (DESYREL) 50 MG tablet TAKE 1 TABLET(50 MG) BY MOUTH AT BEDTIME 06/03/23   Myrlene Broker, MD  VITAMIN E PO Take 1 capsule by mouth daily with breakfast.    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   Secondary esophageal varices without bleeding (HCC) 12/03/2020   Aortic atherosclerosis (HCC) 11/20/2020   Tobacco abuse 11/20/2020   Concern for possible hepatopulmonary syndrome (HCC) 10/07/2020   Chronic respiratory failure with hypoxia (HCC) 10/07/2020   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   Iron deficiency anemia 08/28/2020   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   Peripheral neuropathy 08/28/2020   Cirrhosis of liver (HCC) 08/28/2020   Lung nodule 08/28/2020  Past Medical History:  Diagnosis Date   Cirrhosis (HCC)    Diabetes mellitus without complication (HCC)    Hyperlipemia    Hypertension    Neuropathy    Spleen disease     Past Surgical History:  Procedure Laterality Date   BIOPSY  10/20/2020   Procedure: BIOPSY;  Surgeon: Meridee Score Netty Starring., MD;  Location: [facility];  Service: Gastroenterology;;   COLONOSCOPY WITH PROPOFOL N/A 10/20/2020   Procedure: COLONOSCOPY WITH PROPOFOL;  Surgeon: Lemar Lofty., MD;  Location:  [facility];  Service: Gastroenterology;  Laterality: N/A;   ESOPHAGOGASTRODUODENOSCOPY (EGD) WITH PROPOFOL N/A 10/20/2020   Procedure: ESOPHAGOGASTRODUODENOSCOPY (EGD) WITH PROPOFOL;  Surgeon: Meridee Score Netty Starring., MD;  Location: [facility];  Service: Gastroenterology;  Laterality: N/A;   INTRAOPERATIVE TRANSTHORACIC ECHOCARDIOGRAM  10/17/2020   Procedure: TRANSTHORACIC ECHOCARDIOGRAM;  Surgeon: Pricilla Riffle, MD;  Location: [facility];  Service: Cardiovascular;;   POLYPECTOMY  10/20/2020   Procedure: POLYPECTOMY;  Surgeon: Lemar Lofty., MD;  Location: [facility];  Service: Gastroenterology;;   RADIOFREQUENCY ABLATION  10/20/2020   Procedure: RADIO FREQUENCY ABLATION;  Surgeon: Lemar Lofty., MD;  Location: [facility];  Service: Gastroenterology;;   RECONSTRUCTION OF NOSE     TRACHEOSTOMY     TRACHEOSTOMY CLOSURE      Social History   Socioeconomic History   Marital status: Married    Spouse name: Not on file   Number of children: 0   Years of education: Not on file   Highest education level: Not on file  Occupational History   Occupation: part time courier  Tobacco Use   Smoking status: Some Days    Current packs/day: 0.50    Average packs/day: 0.5 packs/day for 56.3 years (28.1 ttl pk-yrs)    Types: Cigarettes    Start date: 07/20/1967   Smokeless tobacco: Never   Tobacco comments:    smokes 1 per day.  Vaping Use   Vaping status: Never Used  Substance and Sexual Activity   Alcohol use: Not Currently   Drug use: Never   Sexual activity: Not on file  Other Topics Concern   Not on file  Social History Narrative   Not on file   Social Drivers of Health   Financial Resource Strain: Not on file  Food Insecurity: Not on file  Transportation Needs: Not on file  Physical Activity: Not on file  Stress: Not on file  Social Connections: Not on file  Intimate Partner Violence: Not on file    Family History  Problem Relation Age of Onset   GER  disease Mother    Heart disease Mother    Diabetes Father    Hypertension Sister    Hypertension Brother    Prostate cancer Maternal Grandfather    Hypertension Brother    Hypertension Brother    Hypertension Brother    Colon cancer Neg Hx    Esophageal cancer Neg Hx    Kidney disease Neg Hx    Liver disease Neg Hx    Pancreatic cancer Neg Hx    Rectal cancer Neg Hx    Stomach cancer Neg Hx      Review of Systems  Constitutional: Negative.  Negative for chills and fever.  HENT: Negative.  Negative for congestion and sore throat.   Respiratory: Negative.  Negative for cough and shortness of breath.   Cardiovascular:  Positive for leg swelling. Negative for chest pain and palpitations.  Gastrointestinal:  Negative for abdominal pain, diarrhea, nausea and vomiting.  Genitourinary: Negative.  Negative for dysuria and hematuria.  Skin: Negative.  Negative for rash.  Neurological: Negative.  Negative for dizziness and headaches.  All other systems reviewed and are negative.   Today's Vitals   10/27/23 1317  BP: 110/70  Pulse: 90  Temp: 98.3 F (36.8 C)  TempSrc: Oral  SpO2: 93%  Weight: 191 lb (86.6 kg)  Height: 5\' 2"  (1.575 m)   Body mass index is 34.93 kg/m.   Physical Exam Vitals reviewed.  Constitutional:      Appearance: Normal appearance.  HENT:     Head: Normocephalic.     Mouth/Throat:     Mouth: Mucous membranes are moist.     Pharynx: Oropharynx is clear.  Eyes:     Extraocular Movements: Extraocular movements intact.     Pupils: Pupils are equal, round, and reactive to light.  Cardiovascular:     Rate and Rhythm: Normal rate and regular rhythm.     Heart sounds: Murmur heard.  Pulmonary:     Effort: Pulmonary effort is normal.     Comments: Decreased breath sounds left lower lobe, possible effusion Abdominal:     General: There is distension.     Tenderness: There is no abdominal tenderness.     Comments: Positive ascites  Musculoskeletal:      Cervical back: No tenderness.     Right lower leg: Edema present.     Left lower leg: Edema present.  Lymphadenopathy:     Cervical: No cervical adenopathy.  Neurological:     Mental Status: She is alert.      ASSESSMENT & PLAN: A total of 44 minutes was spent with the patient and counseling/coordination of care regarding preparing for this visit, review of most recent office visit notes, review of multiple chronic medical conditions and their management, review of all medications, review of most recent bloodwork results, review of health maintenance items, education on nutrition, prognosis, documentation, and need for follow up.   Problem List Items Addressed This Visit       Digestive   Cirrhosis of liver (HCC) - Primary   Seems to be volume overloaded Wt Readings from Last 3 Encounters:  10/27/23 191 lb (86.6 kg)  12/07/22 190 lb (86.2 kg)  01/05/22 184 lb (83.5 kg)  Not taking Aldactone at present time Recommend to start Aldactone 50 mg twice a day May need to restart Lasix which she is not taking at present time Recommend blood work today Chest x-ray today to rule out pleural effusion Mental status okay.  No signs of hepatic encephalopathy.  No asterixis.  Afebrile.  No abdominal tenderness to suggest spontaneous bacterial peritonitis. Lots of peripheral edema.  No signs of cellulitis on her legs. ED precautions given Needs to follow-up with PCP and GI doctor soon        Relevant Medications   spironolactone (ALDACTONE) 25 MG tablet   Other Relevant Orders   CBC with Differential/Platelet   Comprehensive metabolic panel with GFR   Hemoglobin A1c   Patient Instructions  Long-Term Liver Injury (Cirrhosis): What to Know  Cirrhosis is a long-term (chronic) liver injury. The liver is an organ in your body that has many jobs. Your liver: Changes food into energy. Gets rid of toxic things in your blood. Makes proteins. Absorbs vitamins from food. If you have  cirrhosis, scar tissue takes the place of your healthy liver cells. This stops blood from flowing through your liver. It makes it hard for your liver to  do what it needs to do. What are the causes? Cirrhosis may be caused by: Hepatitis C. Drinking a lot of alcohol for a long time. Diseases, such as: Nonalcoholic fatty liver disease (NAFLD). It's also called metabolic dysfunction-associated steatotic liver disease (MASLD). Diseases that block your liver ducts. Liver diseases you were born with. Hepatitis B. Autoimmune hepatitis. This is when your body's defense system (immune system) attacks your liver cells by mistake. An infection with a parasite. A reaction to things you've been exposed to for a long time, such as: Certain medicines you take. These include heart medicines. Certain toxins. These include organic solvents. What increases the risk? You're more likely to get cirrhosis if: You have hepatitis. You drink a lot of alcohol for a long time. You're overweight. You're female. You're over 79 years old. You use IV drugs and share needles. You have sex without protection, and the other person has hepatitis. What are the signs or symptoms? You may not have symptoms at first. Symptoms may start when the damage to your liver gets worse. Early symptoms may include: Feeling weak and tired. Trouble sleeping. Itchy skin. Your belly feeling tender. Losing weight or not getting as hungry as normal. Feeling like you may throw up. Muscle loss and cramps. Later symptoms may include: Jaundice. This is when your skin or the white parts of your eyes turn yellow. A buildup of fluid in your belly. Your clothes may fit tight around your waist. Weight gain and swelling of your feet and ankles. Trouble breathing. Easy bruising and bleeding. Throwing up blood. Black or bloody poop. Feeling confused. How is this diagnosed? Cirrhosis may be diagnosed based on your symptoms, medical history, and  an exam. Your health care provider may feel your liver. You may also need tests. These may include: Blood tests to check: For hepatitis B or C. How well your kidney works. How well your liver works. Imaging tests, such as: An MRI or CT scan. An ultrasound. This can check if your liver tissue is being replaced by scar tissue. How is this treated? Treatment may depend on how damaged your liver is and what caused the damage. It may mean treating your symptoms or treating the problems that caused the cirrhosis. Treatment may include: Changes to what you eat and drink. You may need to: Eat healthy. Work with your provider to make an eating plan. Not take in as much salt. Stay at a healthy weight. Not use drugs or alcohol. Taking medicines to: Treat infections. Help with itching. Reduce fluid buildup. Reduce certain toxins in your blood. Lower your risk of bleeding. Getting a liver transplant. This is done if you have liver failure. Follow these instructions at home: Take your medicines only as told. Do not take medicines that are bad for your liver. Ask your provider before taking any new medicines. Ask before you take acetaminophen. Rest as needed. Eat a healthy diet. Eat smaller meals every 3-4 hours. Limit your salt or water intake as told. Avoid: Raw or undercooked shellfish, fish, and meat. Unpasteurized milk and milk products. Do not drink alcohol. Keep all follow-up visits. Your provider will check if you're getting better. Contact a health care provider if: You have tiredness or weakness that's getting worse. You have swelling in: Your hands. Your feet. Your legs. Fluid builds up in your belly. You have a fever or chills. You lose or gain weight. You throw up or feel like you may throw up. You get jaundice. You start to  bruise or bleed easily. Get help right away if: You throw up, and it looks like: Bright red blood. Coffee grounds. Your poop looks bloody or  black. You get confused. You have chest pain or trouble breathing. These symptoms may be an emergency. Call 911 right away. Do not wait to see if the symptoms will go away. Do not drive yourself to the hospital. This information is not intended to replace advice given to you by your health care provider. Make sure you discuss any questions you have with your health care provider. Document Revised: 12/24/2022 Document Reviewed: 12/24/2022 Elsevier Patient Education  2024 [employer] Inc.   Edwina Barth, MD [facility]

## 2023-10-27 NOTE — Patient Instructions (Signed)
 Long-Term Liver Injury (Cirrhosis): What to Know  Cirrhosis is a long-term (chronic) liver injury. The liver is an organ in your body that has many jobs. Your liver: Changes food into energy. Gets rid of toxic things in your blood. Makes proteins. Absorbs vitamins from food. If you have cirrhosis, scar tissue takes the place of your healthy liver cells. This stops blood from flowing through your liver. It makes it hard for your liver to do what it needs to do. What are the causes? Cirrhosis may be caused by: Hepatitis C. Drinking a lot of alcohol for a long time. Diseases, such as: Nonalcoholic fatty liver disease (NAFLD). It's also called metabolic dysfunction-associated steatotic liver disease (MASLD). Diseases that block your liver ducts. Liver diseases you were born with. Hepatitis B. Autoimmune hepatitis. This is when your body's defense system (immune system) attacks your liver cells by mistake. An infection with a parasite. A reaction to things you've been exposed to for a long time, such as: Certain medicines you take. These include heart medicines. Certain toxins. These include organic solvents. What increases the risk? You're more likely to get cirrhosis if: You have hepatitis. You drink a lot of alcohol for a long time. You're overweight. You're female. You're over 75 years old. You use IV drugs and share needles. You have sex without protection, and the other person has hepatitis. What are the signs or symptoms? You may not have symptoms at first. Symptoms may start when the damage to your liver gets worse. Early symptoms may include: Feeling weak and tired. Trouble sleeping. Itchy skin. Your belly feeling tender. Losing weight or not getting as hungry as normal. Feeling like you may throw up. Muscle loss and cramps. Later symptoms may include: Jaundice. This is when your skin or the white parts of your eyes turn yellow. A buildup of fluid in your belly. Your  clothes may fit tight around your waist. Weight gain and swelling of your feet and ankles. Trouble breathing. Easy bruising and bleeding. Throwing up blood. Black or bloody poop. Feeling confused. How is this diagnosed? Cirrhosis may be diagnosed based on your symptoms, medical history, and an exam. Your health care provider may feel your liver. You may also need tests. These may include: Blood tests to check: For hepatitis B or C. How well your kidney works. How well your liver works. Imaging tests, such as: An MRI or CT scan. An ultrasound. This can check if your liver tissue is being replaced by scar tissue. How is this treated? Treatment may depend on how damaged your liver is and what caused the damage. It may mean treating your symptoms or treating the problems that caused the cirrhosis. Treatment may include: Changes to what you eat and drink. You may need to: Eat healthy. Work with your provider to make an eating plan. Not take in as much salt. Stay at a healthy weight. Not use drugs or alcohol. Taking medicines to: Treat infections. Help with itching. Reduce fluid buildup. Reduce certain toxins in your blood. Lower your risk of bleeding. Getting a liver transplant. This is done if you have liver failure. Follow these instructions at home: Take your medicines only as told. Do not take medicines that are bad for your liver. Ask your provider before taking any new medicines. Ask before you take acetaminophen. Rest as needed. Eat a healthy diet. Eat smaller meals every 3-4 hours. Limit your salt or water intake as told. Avoid: Raw or undercooked shellfish, fish, and meat. Unpasteurized  milk and milk products. Do not drink alcohol. Keep all follow-up visits. Your provider will check if you're getting better. Contact a health care provider if: You have tiredness or weakness that's getting worse. You have swelling in: Your hands. Your feet. Your legs. Fluid builds  up in your belly. You have a fever or chills. You lose or gain weight. You throw up or feel like you may throw up. You get jaundice. You start to bruise or bleed easily. Get help right away if: You throw up, and it looks like: Bright red blood. Coffee grounds. Your poop looks bloody or black. You get confused. You have chest pain or trouble breathing. These symptoms may be an emergency. Call 911 right away. Do not wait to see if the symptoms will go away. Do not drive yourself to the hospital. This information is not intended to replace advice given to you by your health care provider. Make sure you discuss any questions you have with your health care provider. Document Revised: 12/24/2022 Document Reviewed: 12/24/2022 Elsevier Patient Education  2024 [employer].

## 2023-10-27 NOTE — Assessment & Plan Note (Signed)
 Seems to be volume overloaded Wt Readings from Last 3 Encounters:  10/27/23 191 lb (86.6 kg)  12/07/22 190 lb (86.2 kg)  01/05/22 184 lb (83.5 kg)  Not taking Aldactone at present time Recommend to start Aldactone 50 mg twice a day May need to restart Lasix which she is not taking at present time Recommend blood work today Chest x-ray today to rule out pleural effusion Mental status okay.  No signs of hepatic encephalopathy.  No asterixis.  Afebrile.  No abdominal tenderness to suggest spontaneous bacterial peritonitis. Lots of peripheral edema.  No signs of cellulitis on her legs. ED precautions given Needs to follow-up with PCP and GI doctor soon

## 2023-11-07 ENCOUNTER — Emergency Department (HOSPITAL_COMMUNITY)

## 2023-11-07 ENCOUNTER — Inpatient Hospital Stay (HOSPITAL_COMMUNITY)
Admission: EM | Admit: 2023-11-07 | Discharge: 2023-11-23 | DRG: 432 | Disposition: A | Discharge status: Skilled Nursing Facility | Attending: Internal Medicine | Admitting: Internal Medicine

## 2023-11-07 ENCOUNTER — Other Ambulatory Visit: Payer: Self-pay

## 2023-11-07 DIAGNOSIS — R601 Generalized edema: Secondary | ICD-10-CM | POA: Diagnosis present

## 2023-11-07 DIAGNOSIS — R0602 Shortness of breath: Secondary | ICD-10-CM | POA: Diagnosis not present

## 2023-11-07 DIAGNOSIS — K8020 Calculus of gallbladder without cholecystitis without obstruction: Secondary | ICD-10-CM | POA: Diagnosis not present

## 2023-11-07 DIAGNOSIS — Z860100 Personal history of colon polyps, unspecified: Secondary | ICD-10-CM

## 2023-11-07 DIAGNOSIS — K921 Melena: Secondary | ICD-10-CM | POA: Diagnosis not present

## 2023-11-07 DIAGNOSIS — K7290 Hepatic failure, unspecified without coma: Secondary | ICD-10-CM | POA: Diagnosis present

## 2023-11-07 DIAGNOSIS — Z8249 Family history of ischemic heart disease and other diseases of the circulatory system: Secondary | ICD-10-CM

## 2023-11-07 DIAGNOSIS — K766 Portal hypertension: Secondary | ICD-10-CM | POA: Diagnosis not present

## 2023-11-07 DIAGNOSIS — D62 Acute posthemorrhagic anemia: Secondary | ICD-10-CM | POA: Diagnosis present

## 2023-11-07 DIAGNOSIS — R161 Splenomegaly, not elsewhere classified: Secondary | ICD-10-CM | POA: Diagnosis not present

## 2023-11-07 DIAGNOSIS — R918 Other nonspecific abnormal finding of lung field: Secondary | ICD-10-CM | POA: Diagnosis not present

## 2023-11-07 DIAGNOSIS — K7682 Hepatic encephalopathy: Secondary | ICD-10-CM | POA: Diagnosis present

## 2023-11-07 DIAGNOSIS — R9389 Abnormal findings on diagnostic imaging of other specified body structures: Secondary | ICD-10-CM | POA: Diagnosis present

## 2023-11-07 DIAGNOSIS — K219 Gastro-esophageal reflux disease without esophagitis: Secondary | ICD-10-CM | POA: Diagnosis present

## 2023-11-07 DIAGNOSIS — I700 Atherosclerosis of aorta: Secondary | ICD-10-CM | POA: Diagnosis not present

## 2023-11-07 DIAGNOSIS — F17210 Nicotine dependence, cigarettes, uncomplicated: Secondary | ICD-10-CM | POA: Diagnosis not present

## 2023-11-07 DIAGNOSIS — I951 Orthostatic hypotension: Secondary | ICD-10-CM | POA: Diagnosis present

## 2023-11-07 DIAGNOSIS — K743 Primary biliary cirrhosis: Secondary | ICD-10-CM | POA: Diagnosis present

## 2023-11-07 DIAGNOSIS — K3189 Other diseases of stomach and duodenum: Secondary | ICD-10-CM | POA: Diagnosis not present

## 2023-11-07 DIAGNOSIS — E8770 Fluid overload, unspecified: Secondary | ICD-10-CM | POA: Diagnosis present

## 2023-11-07 DIAGNOSIS — J90 Pleural effusion, not elsewhere classified: Secondary | ICD-10-CM | POA: Diagnosis not present

## 2023-11-07 DIAGNOSIS — J9811 Atelectasis: Secondary | ICD-10-CM | POA: Diagnosis not present

## 2023-11-07 DIAGNOSIS — K31819 Angiodysplasia of stomach and duodenum without bleeding: Secondary | ICD-10-CM | POA: Diagnosis not present

## 2023-11-07 DIAGNOSIS — K5730 Diverticulosis of large intestine without perforation or abscess without bleeding: Secondary | ICD-10-CM | POA: Diagnosis present

## 2023-11-07 DIAGNOSIS — R600 Localized edema: Principal | ICD-10-CM

## 2023-11-07 DIAGNOSIS — Z6832 Body mass index (BMI) 32.0-32.9, adult: Secondary | ICD-10-CM

## 2023-11-07 DIAGNOSIS — I10 Essential (primary) hypertension: Secondary | ICD-10-CM | POA: Diagnosis present

## 2023-11-07 DIAGNOSIS — E785 Hyperlipidemia, unspecified: Secondary | ICD-10-CM | POA: Diagnosis not present

## 2023-11-07 DIAGNOSIS — R54 Age-related physical debility: Secondary | ICD-10-CM | POA: Diagnosis present

## 2023-11-07 DIAGNOSIS — E1140 Type 2 diabetes mellitus with diabetic neuropathy, unspecified: Secondary | ICD-10-CM | POA: Diagnosis present

## 2023-11-07 DIAGNOSIS — R7303 Prediabetes: Secondary | ICD-10-CM

## 2023-11-07 DIAGNOSIS — E66811 Obesity, class 1: Secondary | ICD-10-CM | POA: Diagnosis present

## 2023-11-07 DIAGNOSIS — K7460 Unspecified cirrhosis of liver: Secondary | ICD-10-CM | POA: Diagnosis not present

## 2023-11-07 DIAGNOSIS — E669 Obesity, unspecified: Secondary | ICD-10-CM | POA: Diagnosis not present

## 2023-11-07 DIAGNOSIS — R188 Other ascites: Secondary | ICD-10-CM | POA: Diagnosis present

## 2023-11-07 DIAGNOSIS — K31811 Angiodysplasia of stomach and duodenum with bleeding: Secondary | ICD-10-CM | POA: Diagnosis not present

## 2023-11-07 DIAGNOSIS — D649 Anemia, unspecified: Secondary | ICD-10-CM | POA: Diagnosis not present

## 2023-11-07 DIAGNOSIS — E8809 Other disorders of plasma-protein metabolism, not elsewhere classified: Secondary | ICD-10-CM | POA: Diagnosis present

## 2023-11-07 DIAGNOSIS — K922 Gastrointestinal hemorrhage, unspecified: Secondary | ICD-10-CM | POA: Diagnosis present

## 2023-11-07 DIAGNOSIS — Z833 Family history of diabetes mellitus: Secondary | ICD-10-CM

## 2023-11-07 DIAGNOSIS — Z48813 Encounter for surgical aftercare following surgery on the respiratory system: Secondary | ICD-10-CM | POA: Diagnosis not present

## 2023-11-07 DIAGNOSIS — E119 Type 2 diabetes mellitus without complications: Secondary | ICD-10-CM

## 2023-11-07 DIAGNOSIS — R5383 Other fatigue: Secondary | ICD-10-CM | POA: Diagnosis present

## 2023-11-07 DIAGNOSIS — I3481 Nonrheumatic mitral (valve) annulus calcification: Secondary | ICD-10-CM | POA: Diagnosis not present

## 2023-11-07 DIAGNOSIS — K828 Other specified diseases of gallbladder: Secondary | ICD-10-CM | POA: Diagnosis not present

## 2023-11-07 DIAGNOSIS — D509 Iron deficiency anemia, unspecified: Secondary | ICD-10-CM | POA: Diagnosis present

## 2023-11-07 DIAGNOSIS — K7469 Other cirrhosis of liver: Secondary | ICD-10-CM | POA: Diagnosis not present

## 2023-11-07 DIAGNOSIS — J811 Chronic pulmonary edema: Secondary | ICD-10-CM | POA: Diagnosis not present

## 2023-11-07 DIAGNOSIS — I952 Hypotension due to drugs: Secondary | ICD-10-CM | POA: Diagnosis not present

## 2023-11-07 DIAGNOSIS — I8510 Secondary esophageal varices without bleeding: Secondary | ICD-10-CM | POA: Diagnosis present

## 2023-11-07 DIAGNOSIS — I8500 Esophageal varices without bleeding: Secondary | ICD-10-CM | POA: Diagnosis not present

## 2023-11-07 DIAGNOSIS — K648 Other hemorrhoids: Secondary | ICD-10-CM | POA: Diagnosis present

## 2023-11-07 DIAGNOSIS — Z79899 Other long term (current) drug therapy: Secondary | ICD-10-CM

## 2023-11-07 LAB — COMPREHENSIVE METABOLIC PANEL WITH GFR
ALT: 20 U/L (ref 0–44)
AST: 36 U/L (ref 15–41)
Albumin: 2.1 g/dL — ABNORMAL LOW (ref 3.5–5.0)
Alkaline Phosphatase: 93 U/L (ref 38–126)
Anion gap: 10 (ref 5–15)
BUN: 7 mg/dL — ABNORMAL LOW (ref 8–23)
CO2: 28 mmol/L (ref 22–32)
Calcium: 8.4 mg/dL — ABNORMAL LOW (ref 8.9–10.3)
Chloride: 100 mmol/L (ref 98–111)
Creatinine, Ser: 0.72 mg/dL (ref 0.44–1.00)
GFR, Estimated: >60 mL/min (ref >60)
Glucose, Bld: 85 mg/dL (ref 70–99)
Potassium: 3.5 mmol/L (ref 3.5–5.1)
Sodium: 138 mmol/L (ref 135–145)
Total Bilirubin: 0.8 mg/dL (ref 0.0–1.2)
Total Protein: 6.8 g/dL (ref 6.5–8.1)

## 2023-11-07 LAB — CBC WITH DIFFERENTIAL/PLATELET
Abs Immature Granulocytes: 0.02 10*3/uL (ref 0.00–0.07)
Basophils Absolute: 0.1 10*3/uL (ref 0.0–0.1)
Basophils Relative: 1 %
Eosinophils Absolute: 0.2 10*3/uL (ref 0.0–0.5)
Eosinophils Relative: 3 %
HCT: 38.9 % (ref 36.0–46.0)
Hemoglobin: 11.3 g/dL — ABNORMAL LOW (ref 12.0–15.0)
Immature Granulocytes: 0 %
Lymphocytes Relative: 25 %
Lymphs Abs: 1.5 10*3/uL (ref 0.7–4.0)
MCH: 25.8 pg — ABNORMAL LOW (ref 26.0–34.0)
MCHC: 29 g/dL — ABNORMAL LOW (ref 30.0–36.0)
MCV: 88.8 fL (ref 80.0–100.0)
Monocytes Absolute: 0.6 10*3/uL (ref 0.1–1.0)
Monocytes Relative: 11 %
Neutro Abs: 3.5 10*3/uL (ref 1.7–7.7)
Neutrophils Relative %: 60 %
Platelets: 188 10*3/uL (ref 150–400)
RBC: 4.38 MIL/uL (ref 3.87–5.11)
RDW: 17.1 % — ABNORMAL HIGH (ref 11.5–15.5)
WBC: 5.9 10*3/uL (ref 4.0–10.5)
nRBC: 0 % (ref 0.0–0.2)

## 2023-11-07 LAB — TROPONIN I (HIGH SENSITIVITY)
Troponin I (High Sensitivity): 5 ng/L (ref <18)
Troponin I (High Sensitivity): 5 ng/L (ref <18)

## 2023-11-07 LAB — BRAIN NATRIURETIC PEPTIDE: B Natriuretic Peptide: 101.4 pg/mL — ABNORMAL HIGH (ref 0.0–100.0)

## 2023-11-07 NOTE — ED Provider Triage Note (Signed)
 Emergency Medicine Provider Triage Evaluation Note  Sierra Logan , a 68 y.o. female  was evaluated in triage.  Pt complains of swelling in her legs and abdomen for the past 2 weeks.  She also reports some shortness of breath along with this.  Denies any chest pain.  She does have a history of cirrhosis.  Review of Systems  Positive: As above Negative: As above  Physical Exam  BP 112/65 (BP Location: Right Arm)   Pulse 93   Temp 97.9 F (36.6 C)   Resp 18   Ht 5\' 2"  (1.575 m)   Wt 84.8 kg   SpO2 90%   BMI 34.20 kg/m  Gen:   Awake, no distress   Resp:  Normal effort  MSK:   Moves extremities without difficulty  Other:  Nonpitting edema of the bilateral lower extremities, abdomen distended but nontender to palpation  Medical Decision Making  Medically screening exam initiated at 4:01 PM.  Appropriate orders placed.  GISELL BUEHRLE was informed that the remainder of the evaluation will be completed by another provider, this initial triage assessment does not replace that evaluation, and the importance of remaining in the ED until their evaluation is complete.     Rexie Catena, PA-C 11/07/23 [phone]

## 2023-11-07 NOTE — ED Triage Notes (Signed)
 PT complains of leg, feet and stomach swelling x 2 week. Pt went to doc last week and was given something for her liver but not for swelling. Pt states swelling has been getting worse and is becoming SOB

## 2023-11-08 ENCOUNTER — Inpatient Hospital Stay (HOSPITAL_COMMUNITY)

## 2023-11-08 ENCOUNTER — Encounter (HOSPITAL_COMMUNITY): Payer: Self-pay | Admitting: Internal Medicine

## 2023-11-08 DIAGNOSIS — I10 Essential (primary) hypertension: Secondary | ICD-10-CM | POA: Diagnosis not present

## 2023-11-08 DIAGNOSIS — D62 Acute posthemorrhagic anemia: Secondary | ICD-10-CM | POA: Diagnosis not present

## 2023-11-08 DIAGNOSIS — R600 Localized edema: Secondary | ICD-10-CM

## 2023-11-08 DIAGNOSIS — K31819 Angiodysplasia of stomach and duodenum without bleeding: Secondary | ICD-10-CM | POA: Diagnosis not present

## 2023-11-08 DIAGNOSIS — R2681 Unsteadiness on feet: Secondary | ICD-10-CM | POA: Diagnosis not present

## 2023-11-08 DIAGNOSIS — E669 Obesity, unspecified: Secondary | ICD-10-CM | POA: Diagnosis present

## 2023-11-08 DIAGNOSIS — Z6832 Body mass index (BMI) 32.0-32.9, adult: Secondary | ICD-10-CM | POA: Diagnosis not present

## 2023-11-08 DIAGNOSIS — Z7401 Bed confinement status: Secondary | ICD-10-CM | POA: Diagnosis not present

## 2023-11-08 DIAGNOSIS — K7460 Unspecified cirrhosis of liver: Secondary | ICD-10-CM

## 2023-11-08 DIAGNOSIS — K743 Primary biliary cirrhosis: Secondary | ICD-10-CM | POA: Diagnosis not present

## 2023-11-08 DIAGNOSIS — R58 Hemorrhage, not elsewhere classified: Secondary | ICD-10-CM | POA: Diagnosis not present

## 2023-11-08 DIAGNOSIS — E119 Type 2 diabetes mellitus without complications: Secondary | ICD-10-CM

## 2023-11-08 DIAGNOSIS — D649 Anemia, unspecified: Secondary | ICD-10-CM | POA: Diagnosis not present

## 2023-11-08 DIAGNOSIS — K922 Gastrointestinal hemorrhage, unspecified: Secondary | ICD-10-CM | POA: Diagnosis not present

## 2023-11-08 DIAGNOSIS — M6281 Muscle weakness (generalized): Secondary | ICD-10-CM | POA: Diagnosis not present

## 2023-11-08 DIAGNOSIS — I8500 Esophageal varices without bleeding: Secondary | ICD-10-CM | POA: Diagnosis not present

## 2023-11-08 DIAGNOSIS — K31811 Angiodysplasia of stomach and duodenum with bleeding: Secondary | ICD-10-CM | POA: Diagnosis not present

## 2023-11-08 DIAGNOSIS — R188 Other ascites: Secondary | ICD-10-CM | POA: Diagnosis not present

## 2023-11-08 DIAGNOSIS — E1140 Type 2 diabetes mellitus with diabetic neuropathy, unspecified: Secondary | ICD-10-CM | POA: Diagnosis not present

## 2023-11-08 DIAGNOSIS — F17200 Nicotine dependence, unspecified, uncomplicated: Secondary | ICD-10-CM | POA: Diagnosis not present

## 2023-11-08 DIAGNOSIS — M6259 Muscle wasting and atrophy, not elsewhere classified, multiple sites: Secondary | ICD-10-CM | POA: Diagnosis not present

## 2023-11-08 DIAGNOSIS — K5730 Diverticulosis of large intestine without perforation or abscess without bleeding: Secondary | ICD-10-CM | POA: Diagnosis not present

## 2023-11-08 DIAGNOSIS — K7682 Hepatic encephalopathy: Secondary | ICD-10-CM | POA: Diagnosis not present

## 2023-11-08 DIAGNOSIS — E785 Hyperlipidemia, unspecified: Secondary | ICD-10-CM | POA: Diagnosis not present

## 2023-11-08 DIAGNOSIS — R0602 Shortness of breath: Secondary | ICD-10-CM

## 2023-11-08 DIAGNOSIS — K7469 Other cirrhosis of liver: Secondary | ICD-10-CM | POA: Diagnosis not present

## 2023-11-08 DIAGNOSIS — R5383 Other fatigue: Secondary | ICD-10-CM | POA: Diagnosis not present

## 2023-11-08 DIAGNOSIS — K7290 Hepatic failure, unspecified without coma: Secondary | ICD-10-CM

## 2023-11-08 DIAGNOSIS — D509 Iron deficiency anemia, unspecified: Secondary | ICD-10-CM | POA: Diagnosis not present

## 2023-11-08 DIAGNOSIS — J9811 Atelectasis: Secondary | ICD-10-CM | POA: Diagnosis not present

## 2023-11-08 DIAGNOSIS — R601 Generalized edema: Secondary | ICD-10-CM | POA: Diagnosis present

## 2023-11-08 DIAGNOSIS — J90 Pleural effusion, not elsewhere classified: Secondary | ICD-10-CM | POA: Diagnosis not present

## 2023-11-08 DIAGNOSIS — K766 Portal hypertension: Secondary | ICD-10-CM | POA: Diagnosis not present

## 2023-11-08 DIAGNOSIS — K921 Melena: Secondary | ICD-10-CM | POA: Diagnosis not present

## 2023-11-08 DIAGNOSIS — R918 Other nonspecific abnormal finding of lung field: Secondary | ICD-10-CM | POA: Diagnosis not present

## 2023-11-08 DIAGNOSIS — I5031 Acute diastolic (congestive) heart failure: Secondary | ICD-10-CM | POA: Diagnosis not present

## 2023-11-08 DIAGNOSIS — I8510 Secondary esophageal varices without bleeding: Secondary | ICD-10-CM | POA: Diagnosis not present

## 2023-11-08 DIAGNOSIS — K3189 Other diseases of stomach and duodenum: Secondary | ICD-10-CM | POA: Diagnosis not present

## 2023-11-08 DIAGNOSIS — R9389 Abnormal findings on diagnostic imaging of other specified body structures: Secondary | ICD-10-CM | POA: Diagnosis present

## 2023-11-08 DIAGNOSIS — E8809 Other disorders of plasma-protein metabolism, not elsewhere classified: Secondary | ICD-10-CM | POA: Diagnosis not present

## 2023-11-08 DIAGNOSIS — I959 Hypotension, unspecified: Secondary | ICD-10-CM | POA: Diagnosis not present

## 2023-11-08 DIAGNOSIS — R2689 Other abnormalities of gait and mobility: Secondary | ICD-10-CM | POA: Diagnosis not present

## 2023-11-08 DIAGNOSIS — I951 Orthostatic hypotension: Secondary | ICD-10-CM | POA: Diagnosis not present

## 2023-11-08 DIAGNOSIS — R278 Other lack of coordination: Secondary | ICD-10-CM | POA: Diagnosis not present

## 2023-11-08 DIAGNOSIS — E8770 Fluid overload, unspecified: Secondary | ICD-10-CM | POA: Diagnosis not present

## 2023-11-08 DIAGNOSIS — K219 Gastro-esophageal reflux disease without esophagitis: Secondary | ICD-10-CM | POA: Diagnosis present

## 2023-11-08 DIAGNOSIS — E66811 Obesity, class 1: Secondary | ICD-10-CM | POA: Diagnosis not present

## 2023-11-08 DIAGNOSIS — F17210 Nicotine dependence, cigarettes, uncomplicated: Secondary | ICD-10-CM | POA: Diagnosis not present

## 2023-11-08 LAB — COMPREHENSIVE METABOLIC PANEL WITH GFR
ALT: 19 U/L (ref 0–44)
AST: 34 U/L (ref 15–41)
Albumin: 2 g/dL — ABNORMAL LOW (ref 3.5–5.0)
Alkaline Phosphatase: 89 U/L (ref 38–126)
Anion gap: 7 (ref 5–15)
BUN: 6 mg/dL — ABNORMAL LOW (ref 8–23)
CO2: 27 mmol/L (ref 22–32)
Calcium: 8 mg/dL — ABNORMAL LOW (ref 8.9–10.3)
Chloride: 102 mmol/L (ref 98–111)
Creatinine, Ser: 0.61 mg/dL (ref 0.44–1.00)
GFR, Estimated: >60 mL/min (ref >60)
Glucose, Bld: 106 mg/dL — ABNORMAL HIGH (ref 70–99)
Potassium: 3.7 mmol/L (ref 3.5–5.1)
Sodium: 136 mmol/L (ref 135–145)
Total Bilirubin: 0.6 mg/dL (ref 0.0–1.2)
Total Protein: 6.3 g/dL — ABNORMAL LOW (ref 6.5–8.1)

## 2023-11-08 LAB — HEMOGLOBIN AND HEMATOCRIT, BLOOD
HCT: 37.8 % (ref 36.0–46.0)
HCT: 38 % (ref 36.0–46.0)
Hemoglobin: 11.5 g/dL — ABNORMAL LOW (ref 12.0–15.0)
Hemoglobin: 11.6 g/dL — ABNORMAL LOW (ref 12.0–15.0)

## 2023-11-08 LAB — MAGNESIUM: Magnesium: 1.8 mg/dL (ref 1.7–2.4)

## 2023-11-08 LAB — CBC WITH DIFFERENTIAL/PLATELET
Abs Immature Granulocytes: 0.01 10*3/uL (ref 0.00–0.07)
Basophils Absolute: 0 10*3/uL (ref 0.0–0.1)
Basophils Relative: 1 %
Eosinophils Absolute: 0.2 10*3/uL (ref 0.0–0.5)
Eosinophils Relative: 3 %
HCT: 33.6 % — ABNORMAL LOW (ref 36.0–46.0)
Hemoglobin: 10.3 g/dL — ABNORMAL LOW (ref 12.0–15.0)
Immature Granulocytes: 0 %
Lymphocytes Relative: 20 %
Lymphs Abs: 1.3 10*3/uL (ref 0.7–4.0)
MCH: 26.3 pg (ref 26.0–34.0)
MCHC: 30.7 g/dL (ref 30.0–36.0)
MCV: 85.9 fL (ref 80.0–100.0)
Monocytes Absolute: 0.7 10*3/uL (ref 0.1–1.0)
Monocytes Relative: 10 %
Neutro Abs: 4.2 10*3/uL (ref 1.7–7.7)
Neutrophils Relative %: 66 %
Platelets: 157 10*3/uL (ref 150–400)
RBC: 3.91 MIL/uL (ref 3.87–5.11)
RDW: 16.9 % — ABNORMAL HIGH (ref 11.5–15.5)
WBC: 6.4 10*3/uL (ref 4.0–10.5)
nRBC: 0 % (ref 0.0–0.2)

## 2023-11-08 LAB — PROTIME-INR
INR: 1.2 (ref 0.8–1.2)
Prothrombin Time: 15.3 s — ABNORMAL HIGH (ref 11.4–15.2)

## 2023-11-08 LAB — MRSA NEXT GEN BY PCR, NASAL: MRSA by PCR Next Gen: NOT DETECTED

## 2023-11-08 LAB — APTT: aPTT: 29 s (ref 24–36)

## 2023-11-08 LAB — POC OCCULT BLOOD, ED: Fecal Occult Bld: POSITIVE — AB

## 2023-11-08 LAB — PROCALCITONIN: Procalcitonin: <0.1 ng/mL

## 2023-11-08 MED ORDER — PANTOPRAZOLE SODIUM 40 MG IV SOLR
40.0000 mg | Freq: Every day | INTRAVENOUS | Status: DC
  Administered 2023-11-08: 40 mg via INTRAVENOUS
  Filled 2023-11-08: qty 10

## 2023-11-08 MED ORDER — FUROSEMIDE 10 MG/ML IJ SOLN: 40.0000 mg | Freq: Two times a day (BID) | INTRAMUSCULAR | Status: DC

## 2023-11-08 MED ORDER — GABAPENTIN 400 MG PO CAPS
400.0000 mg | ORAL_CAPSULE | Freq: Two times a day (BID) | ORAL | Status: DC
  Administered 2023-11-08 – 2023-11-09 (×2): 400 mg via ORAL
  Filled 2023-11-08 (×2): qty 1

## 2023-11-08 MED ORDER — SODIUM CHLORIDE 0.9% FLUSH
3.0000 mL | Freq: Two times a day (BID) | INTRAVENOUS | Status: DC
  Administered 2023-11-08 – 2023-11-23 (×25): 3 mL via INTRAVENOUS

## 2023-11-08 MED ORDER — FUROSEMIDE 10 MG/ML IJ SOLN
40.0000 mg | Freq: Once | INTRAMUSCULAR | Status: AC
  Administered 2023-11-08: 40 mg via INTRAVENOUS
  Filled 2023-11-08: qty 4

## 2023-11-08 MED ORDER — ATORVASTATIN CALCIUM 10 MG PO TABS
10.0000 mg | ORAL_TABLET | Freq: Every day | ORAL | Status: DC
  Administered 2023-11-08 – 2023-11-22 (×15): 10 mg via ORAL
  Filled 2023-11-08 (×15): qty 1

## 2023-11-08 MED ORDER — FUROSEMIDE 10 MG/ML IJ SOLN
40.0000 mg | Freq: Two times a day (BID) | INTRAMUSCULAR | Status: AC
  Administered 2023-11-08 – 2023-11-09 (×2): 40 mg via INTRAVENOUS
  Filled 2023-11-08 (×2): qty 4

## 2023-11-08 MED ORDER — TRAZODONE HCL 50 MG PO TABS
50.0000 mg | ORAL_TABLET | Freq: Every day | ORAL | Status: DC
  Administered 2023-11-08 – 2023-11-22 (×15): 50 mg via ORAL
  Filled 2023-11-08 (×15): qty 1

## 2023-11-08 MED ORDER — PANTOPRAZOLE SODIUM 40 MG IV SOLR
40.0000 mg | Freq: Two times a day (BID) | INTRAVENOUS | Status: DC
  Administered 2023-11-08 – 2023-11-12 (×8): 40 mg via INTRAVENOUS
  Filled 2023-11-08 (×8): qty 10

## 2023-11-08 MED ORDER — GABAPENTIN 300 MG PO CAPS: 400.0000 mg | ORAL_CAPSULE | Freq: Two times a day (BID) | ORAL | Status: DC

## 2023-11-08 MED ORDER — MELATONIN 3 MG PO TABS
3.0000 mg | ORAL_TABLET | Freq: Every evening | ORAL | Status: DC | PRN
  Administered 2023-11-09 – 2023-11-22 (×8): 3 mg via ORAL
  Filled 2023-11-08 (×10): qty 1

## 2023-11-08 MED ORDER — ACETAMINOPHEN 325 MG PO TABS
650.0000 mg | ORAL_TABLET | Freq: Four times a day (QID) | ORAL | Status: DC | PRN
  Administered 2023-11-08 – 2023-11-22 (×4): 650 mg via ORAL
  Filled 2023-11-08 (×4): qty 2

## 2023-11-08 MED ORDER — ALBUTEROL SULFATE (2.5 MG/3ML) 0.083% IN NEBU
2.5000 mg | INHALATION_SOLUTION | Freq: Four times a day (QID) | RESPIRATORY_TRACT | Status: DC | PRN
  Administered 2023-11-18: 2.5 mg via RESPIRATORY_TRACT
  Filled 2023-11-08: qty 3

## 2023-11-08 MED ORDER — ACETAMINOPHEN 650 MG RE SUPP: 650.0000 mg | Freq: Four times a day (QID) | RECTAL | Status: DC | PRN

## 2023-11-08 MED ORDER — SODIUM CHLORIDE 0.9 % IV SOLN
50.0000 ug/h | INTRAVENOUS | Status: DC
  Administered 2023-11-08 – 2023-11-09 (×2): 50 ug/h via INTRAVENOUS
  Filled 2023-11-08 (×3): qty 1

## 2023-11-08 MED ORDER — ONDANSETRON HCL 4 MG/2ML IJ SOLN: 4.0000 mg | Freq: Four times a day (QID) | INTRAMUSCULAR | Status: DC | PRN

## 2023-11-08 MED ORDER — SPIRONOLACTONE 25 MG PO TABS
50.0000 mg | ORAL_TABLET | Freq: Every day | ORAL | Status: DC
  Administered 2023-11-09 – 2023-11-11 (×3): 50 mg via ORAL
  Filled 2023-11-08 (×3): qty 2

## 2023-11-08 MED ORDER — SODIUM CHLORIDE (PF) 0.9 % IJ SOLN
INTRAMUSCULAR | Status: AC
  Administered 2023-11-08: 10 mL
  Filled 2023-11-08: qty 10

## 2023-11-08 MED ORDER — GABAPENTIN 300 MG PO CAPS: 800.0000 mg | ORAL_CAPSULE | Freq: Every day | ORAL | Status: DC

## 2023-11-08 MED ORDER — LACTULOSE 10 GM/15ML PO SOLN
30.0000 g | Freq: Three times a day (TID) | ORAL | Status: DC
  Administered 2023-11-08 – 2023-11-11 (×11): 30 g via ORAL
  Filled 2023-11-08 (×11): qty 45

## 2023-11-08 MED ORDER — SPIRONOLACTONE 25 MG PO TABS: 50.0000 mg | ORAL_TABLET | Freq: Every day | ORAL | Status: DC

## 2023-11-08 MED ORDER — GABAPENTIN 300 MG PO CAPS: 400.0000 mg | ORAL_CAPSULE | Freq: Every day | ORAL | Status: DC

## 2023-11-08 MED ORDER — SODIUM CHLORIDE 0.9 % IV SOLN
1.0000 g | INTRAVENOUS | Status: DC
  Administered 2023-11-08 – 2023-11-10 (×3): 1 g via INTRAVENOUS
  Filled 2023-11-08 (×3): qty 10

## 2023-11-08 NOTE — Plan of Care (Signed)

## 2023-11-08 NOTE — ED Provider Notes (Signed)
 [facility] Provider Note   CSN: [id number] Arrival date & time: 11/07/23  1402     History  Chief Complaint  Patient presents with   Leg Swelling    Sierra Logan is a 68 y.o. female.  The history is provided by the patient and the spouse.   Patient with history of cirrhosis, diabetes, hypertension presents with multiple complaints.  Patient reports increasing swelling of her abdomen and lower extremities for the past several weeks.  She reports chronic shortness of breath.  No chest pain.  No fevers or vomiting.  She has mild abdominal discomfort.  She was seen in her PCP office and started on spironolactone  for the swelling but no improvement.  Patient also reports has been having black stool and occasional blood in her stool.   Past Medical History:  Diagnosis Date   Cirrhosis (HCC)    Diabetes mellitus without complication (HCC)    Hyperlipemia    Hypertension    Neuropathy    Spleen disease     Home Medications Prior to Admission medications   Medication Sig Start Date End Date Taking? Authorizing Provider  acetaminophen  (TYLENOL ) 500 MG tablet Take 1,000 mg by mouth every 8 (eight) hours as needed for moderate pain or headache.    [provider]  atorvastatin  (LIPITOR) 10 MG tablet Take 1 tablet (10 mg total) by mouth at bedtime. 12/07/22   Adelia Homestead, MD  BIOTIN PO Take 1 tablet by mouth daily with breakfast.    [provider]  Calcium  Carb-Cholecalciferol (CALCIUM +D3 PO) Take 1 tablet by mouth daily after breakfast.    [provider]  furosemide  (LASIX ) 40 MG tablet Take 1 tablet (40 mg total) by mouth daily. 11/09/21   Adelia Homestead, MD  gabapentin  (NEURONTIN ) 400 MG capsule TAKE 1 CAPSULE EVERY MORNING AND 2 AT BEDTIME 04/18/23   Adelia Homestead, MD  nicotine  (NICODERM CQ  - DOSED IN MG/24 HOURS) 21 mg/24hr patch Place 21 mg onto the skin daily.    [provider]  Omega-3 Fatty Acids (FISH OIL PO) Take 1 capsule by mouth daily with breakfast.    [provider]  omeprazole  (PRILOSEC) 40 MG capsule Take 1 capsule (40 mg total) by mouth daily. 11/09/21   Adelia Homestead, MD  spironolactone  (ALDACTONE ) 25 MG tablet Take 2 tablets (50 mg total) by mouth daily. 10/27/23 10/26/24  Elvira Hammersmith, MD  sucralfate  (CARAFATE ) 1 GM/10ML suspension SHAKE LIQUID AND TAKE 10 ML(1 GRAM) BY MOUTH TWICE DAILY 02/19/21   Dorsey, [name] T IV, MD  traZODone  (DESYREL ) 50 MG tablet TAKE 1 TABLET(50 MG) BY MOUTH AT BEDTIME 06/03/23   Adelia Homestead, MD  VITAMIN E PO Take 1 capsule by mouth daily with breakfast.    [provider]      Allergies    Patient has no known allergies.    Review of Systems   Review of Systems  Respiratory:  Positive for shortness of breath.   Cardiovascular:  Positive for leg swelling.  Gastrointestinal:  Positive for blood in stool.    Physical Exam Updated Vital Signs BP 119/68 (BP Location: Left Arm)   Pulse 99   Temp 97.7 F (36.5 C) (Oral)   Resp 18   Ht 1.575 m (5\' 2" )   Wt 84.8 kg   SpO2 92%   BMI 34.20 kg/m  Physical Exam CONSTITUTIONAL: Chronically ill-appearing, no acute distress HEAD: Normocephalic/atraumatic ENMT: Mucous  membranes moist NECK: supple no meningeal signs SPINE/BACK:entire spine nontender CV: S1/S2 noted, no murmurs/rubs/gallops noted LUNGS: Lungs are clear to auscultation bilaterally, no apparent distress ABDOMEN: soft, protuberant, no focal tenderness, no rebound or guarding Rectal-bright red blood per rectum noted.  No melena.  Female chaperone present for exam NEURO: Pt is awake/alert/appropriate, moves all extremitiesx4.  No facial droop.   EXTREMITIES: pulses normal/equal, full ROM 2+ symmetric pitting edema to bilateral extremities, no overlying erythema or crepitus SKIN: warm, color normal PSYCH: no abnormalities of mood noted, alert and oriented to  situation  ED Results / Procedures / Treatments   Labs (all labs ordered are listed, but only abnormal results are displayed) Labs Reviewed  CBC WITH DIFFERENTIAL/PLATELET - Abnormal; Notable for the following components:      Result Value   Hemoglobin 11.3 (*)    MCH 25.8 (*)    MCHC 29.0 (*)    RDW 17.1 (*)    All other components within normal limits  COMPREHENSIVE METABOLIC PANEL WITH GFR - Abnormal; Notable for the following components:   BUN 7 (*)    Calcium  8.4 (*)    Albumin  2.1 (*)    All other components within normal limits  BRAIN NATRIURETIC PEPTIDE - Abnormal; Notable for the following components:   B Natriuretic Peptide 101.4 (*)    All other components within normal limits  POC OCCULT BLOOD, ED - Abnormal; Notable for the following components:   Fecal Occult Bld POSITIVE (*)    All other components within normal limits  PROTIME-INR  TYPE AND SCREEN  TROPONIN I (HIGH SENSITIVITY)  TROPONIN I (HIGH SENSITIVITY)    EKG EKG Interpretation Date/Time:  Tuesday November 08 2023 02:55:31 EDT Ventricular Rate:  99 PR Interval:  141 QRS Duration:  100 QT Interval:  369 QTC Calculation: 474 R Axis:   20  Text Interpretation: Sinus rhythm Low voltage, precordial leads Probable anteroseptal infarct, old No significant change since last tracing Confirmed by Eldon Greenland ([id number]) on 11/08/2023 3:28:07 AM  Radiology DG Chest 2 View Result Date: 11/07/2023 CLINICAL DATA:  Shortness of breath. EXAM: CHEST - 2 VIEW COMPARISON:  August 27, 2020 FINDINGS: The heart size and mediastinal contours are within normal limits. There is marked severity calcification of the aortic arch. Mild, diffuse, chronic appearing increased interstitial lung markings are seen. There is opacification of the left lung base. A small to moderate size left pleural effusion is also noted. No pneumothorax is identified. The visualized skeletal structures are unremarkable. IMPRESSION: 1. Findings likely  consistent with marked severity left basilar atelectasis and/or infiltrate. Follow-up with nonemergent chest CT is recommended, as sequelae associated with an underlying neoplastic process cannot be excluded. 2. Small to moderate sized left pleural effusion. Electronically Signed   By: Virgle Grime M.D.   On: 11/07/2023 19:54    Procedures Procedures    Medications Ordered in ED Medications  furosemide  (LASIX ) injection 40 mg (has no administration in time range)    ED Course/ Medical Decision Making/ A&P Clinical Course as of 11/08/23 0416  Tue Nov 08, 2023  0400 Patient with extensive history including cirrhosis presents for multiple complaints.  Patient had increasing lower extremity edema as well as abdominal distention.  No fevers or vomiting.  She also reports recent dark stool.  On exam she has lower extremity edema likely related to her underlying cirrhosis.  Patient also found to have blood in her stool.  She has known history of varices. Currently patient is hemodynamically  appropriate without acute anemia.  However due to her multiple issues, we will plan for admission [DW]  0401 Patient denies any hematemesis [DW]  0416 Discussed with Dr. Brock Canner for admission. Plan will be to admit for diuresis, as well as evaluation by [facility] for her GI bleed.  However no signs of active variceal bleed [DW]    Clinical Course User Index [DW] Eldon Greenland, MD                                 Medical Decision Making Amount and/or Complexity of Data Reviewed Labs: ordered. ECG/medicine tests: ordered.  Risk Prescription drug management. Decision regarding hospitalization.   This patient presents to the ED for concern of leg swelling and shortness of breath, this involves an extensive number of treatment options, and is a complaint that carries with it a high risk of complications and morbidity.  The differential diagnosis includes but is not limited to Acute coronary  syndrome, pneumonia, acute pulmonary edema, pneumothorax, acute anemia, pulmonary embolism, anasarca    Comorbidities that complicate the patient evaluation: Patient's presentation is complicated by their history of cirrhosis  Social Determinants of Health: Patient's  previous history of tobacco use   increases the complexity of managing their presentation  Additional history obtained: Additional history obtained from spouse Records reviewed Primary Care Documents  Lab Tests: I Ordered, and personally interpreted labs.  The pertinent results include: Mild anemia, positive Hemoccult  Imaging Studies ordered: I ordered imaging studies including X-ray chest   I independently visualized and interpreted imaging which showed left pleural effusion I agree with the radiologist interpretation   Medicines ordered and prescription drug management: I ordered medication including furosemide   for edema   Critical Interventions:  admission, monitoring, evaluation by GI  Consultations Obtained: I requested consultation with the admitting physician triad , and discussed  findings as well as pertinent plan - they recommend: admit  Reevaluation: After the interventions noted above, I reevaluated the patient and found that they have :improved  Complexity of problems addressed: Patient's presentation is most consistent with  acute presentation with potential threat to life or bodily function  Disposition: After consideration of the diagnostic results and the patient's response to treatment,  I feel that the patent would benefit from admission   .           Final Clinical Impression(s) / ED Diagnoses Final diagnoses:  Peripheral edema  Pleural effusion  Gastrointestinal hemorrhage, unspecified gastrointestinal hemorrhage type    Rx / DC Orders ED Discharge Orders     None         Eldon Greenland, MD 11/08/23 [phone]

## 2023-11-08 NOTE — H&P (View-Only) (Signed)
 Consultation  Referring Provider: TRH/Smith Primary Care Physician:  Adelia Homestead, MD Primary Gastroenterologist:  Dr. Brice Campi  Reason for Consultation: Decompensated cirrhosis, anasarca, anemia, dark stool   Attending physician's note  I personally saw the patient and performed a substantive portion of this encounter (>50% time spent), including a complete performance of at least one of the key components (MDM, Hx and/or Exam), in conjunction with the APP.  I agree with the APP's note, impression, and recommendations with additional input as follows.    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 PPI twice daily  Ceftriaxone  IV for SBP prophylaxis  Continue diuretics as tolerated, monitor electrolytes and renal function  Lactulose  for hepatic encephalopathy  N.p.o. after midnight   The patient was provided an opportunity to ask questions and all were answered. The patient agreed with the plan and demonstrated an understanding of the instructions.  [name] , MD [phone]     HPI: Sierra Logan is a 68 y.o. female, known to Dr. Brice Campi, but not seen in our office since spring 2022.  She has history of diabetes mellitus, hyperlipidemia, hypertension, neuropathy, and PBC with previously documented esophageal varices, GAVE, and iron deficiency anemia.  She presented to the emergency room last night with complaints of progressive lower extremity swelling and shortness of breath which have progressed over the past couple of weeks.  She says she really just started feeling more short of breath over the past couple of days.  She has not weighed  herself so is uncertain about weight gain.  She does feel that her abdomen is larger as well. No fever or chills at home, nonproductive cough.  She had seen primary care about 10 days ago with edema and was started on Aldactone  50 mg daily.  She has not had any improvement since then. On further questioning she says that her stools have been very dark off and on over the past week or so and that her stool was totally black yesterday.  No nausea or vomiting.  Labs in the ER with WBC of 5.9/hemoglobin 11.3/hematocrit 38.8/MCV 88/platelets 188 BNP 101 Sodium 138/potassium 3.5/BUN 7/creatinine 0.72 LFTs within normal limits INR 1.2/pro time 15.8  Hemoglobin today down to 10.3 and review of her chart shows that her hemoglobin was 12.6 about 10 days ago.  Abdominal imaging Chest x-ray shows significant left basilar infiltrate versus atelectasis and small to moderate left effusion  She last had EGD in April 2022 with documentation of grade 2 varices, diffusely edematous gastric mucosa consistent with portal hypertensive gastropathy and also severe GAVE which was treated with RFA.  She was to return to have repeat EGD in 6 to 8 weeks. Colonoscopy was done at that same setting with poor prep, internal hemorrhoids one 6 mm polyp removed from the transverse colon and also noted diverticulosis. And I asked the patient why she has not followed up with GI, she says she did not know that she was supposed to, and did not seem to correlate these current symptoms with her liver until it was brought up in the ER.  No EtOH use, no regular aspirin or NSAIDs. MELD 3.0: 13 at 11/08/2023  4:52 AM  MELD-Na: 8 at 11/08/2023  4:52 AM Calculated from: Serum Creatinine: 0.61 mg/dL (Using min of 1 mg/dL) at 1/61/0960  4:54 AM Serum Sodium: 136 mmol/L at 11/08/2023  4:52 AM Total Bilirubin: 0.6 mg/dL (Using min of 1 mg/dL) at 0/98/1191  4:78 AM Serum Albumin: 2 g/dL at 2/95/6213  0:86 AM INR(ratio): 1.2 at 11/08/2023  4:09  AM Age at listing (hypothetical): 4 years Sex: Female at 11/08/2023  4:52 AM     Past Medical History:  Diagnosis Date   Cirrhosis (HCC)    Diabetes mellitus without complication (HCC)    Hyperlipemia    Hypertension    Neuropathy    Spleen disease     Past Surgical History:  Procedure Laterality Date   BIOPSY  10/20/2020   Procedure: BIOPSY;  Surgeon: Normie Becton., MD;  Location: [facility];  Service: Gastroenterology;;   COLONOSCOPY WITH PROPOFOL  N/A 10/20/2020   Procedure: COLONOSCOPY WITH PROPOFOL ;  Surgeon: Normie Becton., MD;  Location: [facility];  Service: Gastroenterology;  Laterality: N/A;   ESOPHAGOGASTRODUODENOSCOPY (EGD) WITH PROPOFOL  N/A 10/20/2020   Procedure: ESOPHAGOGASTRODUODENOSCOPY (EGD) WITH PROPOFOL ;  Surgeon: Brice Campi Albino Alu., MD;  Location: [facility];  Service: Gastroenterology;  Laterality: N/A;   INTRAOPERATIVE TRANSTHORACIC ECHOCARDIOGRAM  10/17/2020   Procedure: TRANSTHORACIC ECHOCARDIOGRAM;  Surgeon: Elmyra Haggard, MD;  Location: [facility] END[facility]COPY;  Service: Cardiovascular;;   POLYPECTOMY  10/20/2020   Procedure: POLYPECTOMY;  Surgeon: Normie Becton., MD;  Location: [facility];  Service: Gastroenterology;;   RADIOFREQUENCY ABLATION  10/20/2020   Procedure: RADIO FREQUENCY ABLATION;  Surgeon: Normie Becton., MD;  Location: [facility];  Service: Gastroenterology;;   RECONSTRUCTION OF NOSE     TRACHEOSTOMY     TRACHEOSTOMY CLOSURE      Prior to Admission medications   Medication Sig Start Date End Date Taking? Authorizing Provider  acetaminophen  (TYLENOL ) 500 MG tablet Take 1,000 mg by mouth every 8 (eight) hours as needed for moderate pain or headache.   Yes [provider]  atorvastatin  (LIPITOR) 10 MG tablet Take 1 tablet (10 mg total) by mouth at bedtime. 12/07/22  Yes Adelia Homestead, MD  BIOTIN PO Take 1 tablet by mouth daily with breakfast.   Yes [provider]  Calcium   Carb-Cholecalciferol (CALCIUM +D3 PO) Take 1 tablet by mouth daily after breakfast.   Yes [provider]  gabapentin  (NEURONTIN ) 400 MG capsule TAKE 1 CAPSULE EVERY MORNING AND 2 AT BEDTIME Patient taking differently: Take 400 mg by mouth 2 (two) times daily. TAKE 1 CAPSULE EVERY MORNING AND 2 AT BEDTIME 04/18/23  Yes Adelia Homestead, MD  Omega-3 Fatty Acids (FISH OIL PO) Take 1 capsule by mouth daily with breakfast.   Yes [provider]  spironolactone  (ALDACTONE ) 25 MG tablet Take 2 tablets (50 mg total) by mouth daily. 10/27/23 10/26/24 Yes Sagardia, Isidro Margo, MD  traZODone  (DESYREL ) 50 MG tablet TAKE 1 TABLET(50 MG) BY MOUTH AT BEDTIME Patient taking differently: Take 50 mg by mouth at bedtime. 06/03/23  Yes Adelia Homestead, MD    Current Facility-Administered Medications  Medication Dose Route Frequency Provider Last Rate Last Admin   acetaminophen  (TYLENOL ) tablet 650 mg  650 mg Oral Q6H PRN Howerter, Justin B, DO   650 mg at 11/08/23 5784   Or   acetaminophen  (TYLENOL ) suppository 650 mg  650 mg Rectal Q6H PRN Howerter, Justin B, DO       albuterol  (PROVENTIL ) (2.5 MG/3ML) 0.083% nebulizer solution 2.5 mg  2.5 mg Nebulization  Q6H PRN Smith, Rondell A, MD       atorvastatin  (LIPITOR) tablet 10 mg  10 mg Oral QHS Smith, Rondell A, MD       cefTRIAXone  (ROCEPHIN ) 1 g in sodium chloride  0.9 % 100 mL IVPB  1 g Intravenous Q24H Esterwood, Amy S, PA-C       furosemide  (LASIX ) injection 40 mg  40 mg Intravenous BID Smith, Rondell A, MD       gabapentin  (NEURONTIN ) capsule 400 mg  400 mg Oral BID Smith, Rondell A, MD       melatonin tablet 3 mg  3 mg Oral QHS PRN Howerter, Justin B, DO       octreotide  (SANDOSTATIN ) 500 mcg in sodium chloride  0.9 % 250 mL (2 mcg/mL) infusion  50 mcg/hr Intravenous Continuous Esterwood, Amy S, PA-C       ondansetron  (ZOFRAN ) injection 4 mg  4 mg Intravenous Q6H PRN Howerter, Justin B, DO       pantoprazole  (PROTONIX ) injection 40 mg   40 mg Intravenous Q12H Smith, Rondell A, MD       sodium chloride  flush (NS) 0.9 % injection 3 mL  3 mL Intravenous Q12H Smith, Rondell A, MD   3 mL at 11/08/23 1059   [START ON 11/09/2023] spironolactone  (ALDACTONE ) tablet 50 mg  50 mg Oral Daily Smith, Rondell A, MD       traZODone  (DESYREL ) tablet 50 mg  50 mg Oral QHS Lena Qualia, MD       Current Outpatient Medications  Medication Sig Dispense Refill   acetaminophen  (TYLENOL ) 500 MG tablet Take 1,000 mg by mouth every 8 (eight) hours as needed for moderate pain or headache.     atorvastatin  (LIPITOR) 10 MG tablet Take 1 tablet (10 mg total) by mouth at bedtime. 90 tablet 3   BIOTIN PO Take 1 tablet by mouth daily with breakfast.     Calcium  Carb-Cholecalciferol (CALCIUM +D3 PO) Take 1 tablet by mouth daily after breakfast.     gabapentin  (NEURONTIN ) 400 MG capsule TAKE 1 CAPSULE EVERY MORNING AND 2 AT BEDTIME (Patient taking differently: Take 400 mg by mouth 2 (two) times daily. TAKE 1 CAPSULE EVERY MORNING AND 2 AT BEDTIME) 270 capsule 3   Omega-3 Fatty Acids (FISH OIL PO) Take 1 capsule by mouth daily with breakfast.     spironolactone  (ALDACTONE ) 25 MG tablet Take 2 tablets (50 mg total) by mouth daily. 720 tablet 0   traZODone  (DESYREL ) 50 MG tablet TAKE 1 TABLET(50 MG) BY MOUTH AT BEDTIME (Patient taking differently: Take 50 mg by mouth at bedtime.) 90 tablet 1    Allergies as of 11/07/2023   (No Known Allergies)    Family History  Problem Relation Age of Onset   GER disease Mother    Heart disease Mother    Diabetes Father    Hypertension Sister    Hypertension Brother    Prostate cancer Maternal Grandfather    Hypertension Brother    Hypertension Brother    Hypertension Brother    Colon cancer Neg Hx    Esophageal cancer Neg Hx    Kidney disease Neg Hx    Liver disease Neg Hx    Pancreatic cancer Neg Hx    Rectal cancer Neg Hx    Stomach cancer Neg Hx     Social History   Socioeconomic History   Marital  status: Married    Spouse name: Not on file   Number of children: 0  Years of education: Not on file   Highest education level: Not on file  Occupational History   Occupation: part time courier  Tobacco Use   Smoking status: Some Days    Current packs/day: 0.50    Average packs/day: 0.5 packs/day for 56.3 years (28.2 ttl pk-yrs)    Types: Cigarettes    Start date: 07/20/1967   Smokeless tobacco: Never   Tobacco comments:    smokes 1 per day.  Vaping Use   Vaping status: Never Used  Substance and Sexual Activity   Alcohol use: Not Currently   Drug use: Never   Sexual activity: Not on file  Other Topics Concern   Not on file  Social History Narrative   Not on file   Social Drivers of Health   Financial Resource Strain: Not on file  Food Insecurity: Not on file  Transportation Needs: Not on file  Physical Activity: Not on file  Stress: Not on file  Social Connections: Not on file  Intimate Partner Violence: Not on file    Review of Systems: Pertinent positive and negative review of systems were noted in the above HPI section.  All other review of systems was otherwise negative.   Physical Exam: Vital signs in last 24 hours: Temp:  [97.6 F (36.4 C)-98.1 F (36.7 C)] 97.8 F (36.6 C) (04/22 1351) Pulse Rate:  [88-104] 98 (04/22 1351) Resp:  [12-18] 12 (04/22 1351) BP: (97-135)/(50-77) 97/61 (04/22 1351) SpO2:  [90 %-97 %] 96 % (04/22 1351) Weight:  [84.8 kg] 84.8 kg (04/21 1537)   General:   Alert,  Well-developed,chronically ill appearing older WF, pleasant and cooperative in NAD-mentation slow but appropriate Head:  Normocephalic and atraumatic. Eyes:  Sclera clear, no icterus.   Conjunctiva pink. Ears:  Normal auditory acuity. Nose:  No deformity, discharge,  or lesions. Mouth:  No deformity or lesions.   Neck:  Supple; no masses or thyromegaly.  JVD Lungs: Evidently decreased breath sounds left lower lung, no rhonchi . Heart:  Regular rate and rhythm; no  murmurs, clicks, rubs,  or gallops. Abdomen: Protuberant, nontender, there is 2+ edema bilateral flanks, no palpable hepatosplenomegaly, positive fluid wave but very full feeling Rectal: Dark heme positive stool Msk:  Symmetrical without gross deformities. . Pulses: 2+ edema bilateral lower extremities to the mid thighs. Extremities: Neurologic:  Alert and  oriented x4;  grossly normal neurologically.  No asterixis, mentation slow but appropriate Skin:  Intact without significant lesions or rashes.. Psych:  Alert and cooperative. Normal mood and affect.  Intake/Output from previous day: No intake/output data recorded. Intake/Output this shift: No intake/output data recorded.  Lab Results: Recent Labs    11/07/23 1607 11/08/23 0452  WBC 5.9 6.4  HGB 11.3* 10.3*  HCT 38.9 33.6*  PLT 188 157   BMET Recent Labs    11/07/23 1607 11/08/23 0452  NA 138 136  K 3.5 3.7  CL 100 102  CO2 28 27  GLUCOSE 85 106*  BUN 7* 6*  CREATININE 0.72 0.61  CALCIUM  8.4* 8.0*   LFT Recent Labs    11/08/23 0452  PROT 6.3*  ALBUMIN  2.0*  AST 34  ALT 19  ALKPHOS 89  BILITOT 0.6   PT/INR Recent Labs    11/08/23 0409  LABPROT 15.3*  INR 1.2     IMPRESSION:  #52 68 year old white female with history of primary biliary cirrhosis, decompensated with grade 2 esophageal varices, portal gastropathy and GAVE documented at EGD in 2022.  She underwent  RFA for treatment of GAVE at that time and was to return for follow-up and for consideration of beta-blocker therapy but has not been seen by GI since.  Now with 2 to 3-week history of progressive lower extremity edema, more frequent falls at home, and shortness of breath which has become more apparent over the past several days.  X-ray shows a moderate left effusion and possible left infiltrate  Exam she has anasarca and likely has a large amount of ascites as well as flank edema  She presents now with significant decompensation of her  cirrhosis.  #2 intermittent dark stools and frankly black stool yesterday with drop in hemoglobin of 2 g over the past 10 days Suspect GI bleeding secondary to GAVE/portal gastropathy but cannot rule out varices as overtly black stool yesterday  #3 diabetes mellitus #4.  History of hypertension #5.  Hyperlipidemia-on Lipitor may need to stop #6 probable component of hepatic encephalopathy   PLAN: 2 g sodium diet-liquids today N.p.o. after midnight Will schedule for EGD with Dr. Nanda Gamm in a.m. tomorrow Procedure was discussed in detail with the patient including indications risks and benefits and she is agreeable to proceed. Trend hemoglobin and transfuse as indicated IV PPI twice daily Added Rocephin  IV added octreotide  until we see what EGD shows Schedule for upper abdominal ultrasound, if significant ascites then will need large-volume paracentesis, and labs to rule out SBP though no indication at present Diuresis has been started with IV Lasix  and currently on low-dose Aldactone  Check AFP Added lactulose  20 gm tid- GI will follow with you     Amy EsterwoodPA-C  11/08/2023, 2:43 PM

## 2023-11-08 NOTE — H&P (Signed)
 History and Physical    Patient: Sierra Logan NWG:[id number] DOB: 03-31-1956 DOA: 11/07/2023 DOS: the patient was seen and examined on 11/08/2023 PCP: Adelia Homestead, MD  Patient coming from: Home  Chief Complaint:  Chief Complaint  Patient presents with   Leg Swelling   HPI: Sierra Logan is a 68 y.o. female with medical history significant of hypertension, hyperlipidemia, diabetes mellitus type 2, cirrhosis with associated esophageal varices presents with swelling in the legs, feet, and stomach, and black stools.  Swelling in the legs, feet, and stomach has been present for approximately one month. She is taking Lasix  for fluid retention, but the swelling persists and affects daily activities.  She has been experiencing black stools for about a month.. She has multiple bowel movements per day, with the last one occurring this morning before arriving at the emergency department. No recent surgeries or trauma reported that could explain the bleeding.  Denies any NSAID use.  Patient bowel movement yesterday which was just bloody in appearance.  No fever or chills are present. She experiences stomach pain when sitting up, but no pain otherwise.  Records note patient has been seen by her primary on 4/10 and started on Aldactone  100 mg daily, but reported no improvement in symptoms.  In the emergency department patient was noted to have relatively stable vital signs with heart rates 88-104, and all other vital signs maintained.  Labs since 4/21 significant for hemoglobin 11.3->10.3, BNP 101.4, high-sensitivity troponin negative x 2, and albumin 2.1.  Chest x-ray noted findings consistent with moderate severity left basilar atelectasis and/or infiltrate and small to moderate size left pleural effusion.  Stool guaiac was positive.  Patient was typed and screened for possible need of blood products. She had been given Lasix  40 mg IV x 1 dose.    Review of Systems: As mentioned in the  history of present illness. All other systems reviewed and are negative. Past Medical History:  Diagnosis Date   Cirrhosis (HCC)    Diabetes mellitus without complication (HCC)    Hyperlipemia    Hypertension    Neuropathy    Spleen disease    Past Surgical History:  Procedure Laterality Date   BIOPSY  10/20/2020   Procedure: BIOPSY;  Surgeon: Brice Campi Albino Alu., MD;  Location: [facility];  Service: Gastroenterology;;   COLONOSCOPY WITH PROPOFOL  N/A 10/20/2020   Procedure: COLONOSCOPY WITH PROPOFOL ;  Surgeon: Normie Becton., MD;  Location: [facility];  Service: Gastroenterology;  Laterality: N/A;   ESOPHAGOGASTRODUODENOSCOPY (EGD) WITH PROPOFOL  N/A 10/20/2020   Procedure: ESOPHAGOGASTRODUODENOSCOPY (EGD) WITH PROPOFOL ;  Surgeon: Brice Campi Albino Alu., MD;  Location: [facility];  Service: Gastroenterology;  Laterality: N/A;   INTRAOPERATIVE TRANSTHORACIC ECHOCARDIOGRAM  10/17/2020   Procedure: TRANSTHORACIC ECHOCARDIOGRAM;  Surgeon: Elmyra Haggard, MD;  Location: [facility];  Service: Cardiovascular;;   POLYPECTOMY  10/20/2020   Procedure: POLYPECTOMY;  Surgeon: Normie Becton., MD;  Location: [facility];  Service: Gastroenterology;;   RADIOFREQUENCY ABLATION  10/20/2020   Procedure: RADIO FREQUENCY ABLATION;  Surgeon: Normie Becton., MD;  Location: [facility];  Service: Gastroenterology;;   RECONSTRUCTION OF NOSE     TRACHEOSTOMY     TRACHEOSTOMY CLOSURE     Social History:  reports that she has been smoking cigarettes. She started smoking about 56 years ago. She has a 28.2 pack-year smoking history. She has never used smokeless tobacco. She reports that she does not currently use alcohol. She reports that she does not use drugs.  No  Known Allergies  Family History  Problem Relation Age of Onset   GER disease Mother    Heart disease Mother    Diabetes Father    Hypertension Sister    Hypertension Brother    Prostate cancer Maternal Grandfather     Hypertension Brother    Hypertension Brother    Hypertension Brother    Colon cancer Neg Hx    Esophageal cancer Neg Hx    Kidney disease Neg Hx    Liver disease Neg Hx    Pancreatic cancer Neg Hx    Rectal cancer Neg Hx    Stomach cancer Neg Hx     Prior to Admission medications   Medication Sig Start Date End Date Taking? Authorizing Provider  acetaminophen  (TYLENOL ) 500 MG tablet Take 1,000 mg by mouth every 8 (eight) hours as needed for moderate pain or headache.    [provider]  atorvastatin  (LIPITOR) 10 MG tablet Take 1 tablet (10 mg total) by mouth at bedtime. 12/07/22   Adelia Homestead, MD  BIOTIN PO Take 1 tablet by mouth daily with breakfast.    [provider]  Calcium  Carb-Cholecalciferol (CALCIUM +D3 PO) Take 1 tablet by mouth daily after breakfast.    [provider]  furosemide  (LASIX ) 40 MG tablet Take 1 tablet (40 mg total) by mouth daily. 11/09/21   Adelia Homestead, MD  gabapentin  (NEURONTIN ) 400 MG capsule TAKE 1 CAPSULE EVERY MORNING AND 2 AT BEDTIME 04/18/23   Adelia Homestead, MD  nicotine  (NICODERM CQ  - DOSED IN MG/24 HOURS) 21 mg/24hr patch Place 21 mg onto the skin daily.    [provider]  Omega-3 Fatty Acids (FISH OIL PO) Take 1 capsule by mouth daily with breakfast.    [provider]  omeprazole  (PRILOSEC) 40 MG capsule Take 1 capsule (40 mg total) by mouth daily. 11/09/21   Adelia Homestead, MD  spironolactone  (ALDACTONE ) 25 MG tablet Take 2 tablets (50 mg total) by mouth daily. 10/27/23 10/26/24  Elvira Hammersmith, MD  sucralfate  (CARAFATE ) 1 GM/10ML suspension SHAKE LIQUID AND TAKE 10 ML(1 GRAM) BY MOUTH TWICE DAILY 02/19/21   Ander Bame, MD  traZODone  (DESYREL ) 50 MG tablet TAKE 1 TABLET(50 MG) BY MOUTH AT BEDTIME 06/03/23   Adelia Homestead, MD  VITAMIN E PO Take 1 capsule by mouth daily with breakfast.    [provider]    Physical Exam: Vitals:   11/08/23 0430  11/08/23 0545 11/08/23 0630 11/08/23 0704  BP: (!) 127/55 118/77 129/61   Pulse: 96 (!) 104 100   Resp: 14 18 14    Temp:    98.1 F (36.7 C)  TempSrc:    Oral  SpO2: 94% 94% 96%   Weight:      Height:         Constitutional: Elderly female who appears to be in no acute distress. Eyes: PERRL, lids and conjunctivae normal ENMT: Mucous membranes are moist.  Normal dentition.  Neck: normal, supple  Respiratory: Normal respiratory effort with decreased aeration noted on the left lung field.  O2 saturations currently maintained on room air. Cardiovascular: Regular rate and rhythm, no murmurs / rubs / gallops.  At least 2+ pitting bilateral lower extremity edema 2+ pedal pulses.  Abdomen: Distended abdomen.  Bowel sounds present. Musculoskeletal: no clubbing / cyanosis. No joint deformity upper and lower extremities. Good ROM, no contractures. Normal muscle tone.  Skin: mild erythema noted to bilateral lower extremities. Neurologic: CN 2-12  grossly intact.   Strength 5/5 in all 4.  Psychiatric: Normal judgment and insight. Alert and oriented x 3. Normal mood.   Data Reviewed:   EKG reveals sinus rhythm at 99 bpm with low voltage in the anterior leads reviewed labs, imaging, and prior records as documented. Assessment and Plan:  Fluid overload secondary to suspected decompensated cirrhosis with anasarca Patient presents with complaints of progressively worsening swelling of the lower extremities and abdomen over the last month.  On physical exam patient with at least 2+ pitting bilateral lower extremity edema.  Started on Aldactone  100 mg daily on 4/9 without improvement in symptoms.  Patient had been given Lasix  40 mg IV.  She appears to be fluid overloaded secondary to cirrhosis for which records note secondary to primary biliary cholangitis versus less likely congestive heart failure exacerbation. - Admit to a progressive bed - Strict intake and output - Check echocardiogram - Lasix  40  mg IV twice daily x 2 doses.  Reassess in a.m. to adjust diuresis as deemed medically appropriate,  - Orders placed to resume spironolactone  - Gastroenterology consulted, follow-up for any further recommendations.  Acute blood loss secondary to GI bleed Patient reports having dark black stools over the last month.  Stool guaiac was noted to be positive.  Hemoglobin noted to trend down from 11.3->10.3.  Previous EGD along with colonoscopy done by Dr. Baldomero Bone back in 2022 which noted to have grade 2 varices found in the distal esophagus, hemorrhoids, 6 mm polyp in the transverse colon which was removed, and diverticulosis of the retrosigmoid, sigmoid, and descending colon.  Stool guaiacs were noted to be positive.  Patient was typed and screened for possible need of blood products.  Patient at this is related to upper GI bleed possibly related to GAVE or esophageal varices with reports of dark stools versus diverticular bleed. - Clear liquid diet and n.p.o. after midnight - Serial monitoring of H&H - Transfuse blood products as needed for hemoglobin less than 7 g/dL - Rocephin  IV added - Protonix  IV twice daily - Octreotide  drip - GI consultative services for follow-up for any further recommendations.  Pleural effusion Abnormal chest x-ray Acute.  Chest x-ray noted findings consistent with moderate severity left basilar atelectasis and/or infiltrate and small to moderate size left pleural effusion.  O2 saturations currently maintained on room air.  Suspect symptoms secondary to decompensated cirrhosis and less likely infection - Add on procalcitonin  Diabetes mellitus type 2, without long-term use of insulin Last available hemoglobin A1c was 4.8 when checked on 10/27/2023.  Patient not currently on any medications for treatment.  Hyperlipidemia - Continue atorvastatin  for the time being, but may warrant discontinuation.  GERD - Protonix  IV twice daily  Obesity 34.2 kg/m  DVT  prophylaxis: SCDs  Advance Care Planning:   Code Status: Full Code    Consults: [facility]  Family Communication: None  Severity of Illness: The appropriate patient status for this patient is INPATIENT. Inpatient status is judged to be reasonable and necessary in order to provide the required intensity of service to ensure the patient's safety. The patient's presenting symptoms, physical exam findings, and initial radiographic and laboratory data in the context of their chronic comorbidities is felt to place them at high risk for further clinical deterioration. Furthermore, it is not anticipated that the patient will be medically stable for discharge from the hospital within 2 midnights of admission.   * I certify that at the point of admission it is my clinical  judgment that the patient will require inpatient hospital care spanning beyond 2 midnights from the point of admission due to high intensity of service, high risk for further deterioration and high frequency of surveillance required.*  Author: Lena Qualia, MD 11/08/2023 8:38 AM  For on call review [email or web address].

## 2023-11-08 NOTE — Progress Notes (Signed)
  Carryover admission to the Day Admitter.  I discussed this case with the EDP, Dr. Wallis Gun.  Per these discussions:   This is a 68 year old female with history of cirrhosis, who is being admitted with anasarca, acute volume overload, as well as potential acute lower gastrointestinal bleed after presenting with 1 week of progressive abdominal distention, shortness of breath, worsening edema in the bilateral lower extremities.  She was seen by her outpatient provider in the setting of the above symptoms, prompting initiation of spironolactone , although the patient has noted no significant improvement in the above symptoms following initiation of this diuretic.  Additionally, over the last day, she noted an episode of bright red blood per rectum.  States that she experiences these episodes intermittently.  While she has been experiencing some recent abdominal distention, she denies any significant abdominal discomfort nor any nausea, vomiting, or hematemesis.  She follows with [facility] gastroenterology as an outpatient.  No reported history of underlying heart failure.  It appears that most recent echocardiogram occurred in April 2022.  Vital signs are notable for the following: Heart rates in the 80s to 90s; systolic blood pressures in the 1 teens to 120s; oxygen saturations in the low to mid 90s on room air.  Afebrile.  CMP notable for the following: BUN is 7 compared to most recent prior value of 8 on 10/27/2023.  CBC notable for will hemoglobin of 11.3 compared to 12.1 on 10/27/2023.  Type and screen has been ordered.  She received Lasix  40 mg IV x 1 dose in the ED this evening.  I have placed an order for inpatient admission for further evaluation management of the above.  I have placed some additional preliminary admit orders via the adult multi-morbid admission order set. I have also ordered BNP, echocardiogram.  I will defer additional decision making regarding additional diuresis efforts to the  admitting hospitalist.  I have ordered morning labs in the form of CMP, CBC, magnesium  level.  There is an INR currently pending.  I also ordered PTT, as well as a repeat H&H to occur around 9 AM this morning.  Have also ordered Protonix  40 mg IV daily.    Camelia Cavalier, DO Hospitalist

## 2023-11-08 NOTE — Consult Note (Addendum)
 Consultation  Referring Provider: TRH/Smith Primary Care Physician:  Sierra Homestead, MD Primary Gastroenterologist:  Dr. Brice Logan  Reason for Consultation: Decompensated cirrhosis, anasarca, anemia, dark stool   Attending physician's note  I personally saw the patient and performed a substantive portion of this encounter (>50% time spent), including a complete performance of at least one of the key components (MDM, Hx and/or Exam), in conjunction with the APP.  I agree with the APP's note, impression, and recommendations with additional input as follows.    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 PPI twice daily  Ceftriaxone  IV for SBP prophylaxis  Continue diuretics as tolerated, monitor electrolytes and renal function  Lactulose  for hepatic encephalopathy  N.p.o. after midnight   The patient was provided an opportunity to ask questions and all were answered. The patient agreed with the plan and demonstrated an understanding of the instructions.  Sierra Logan , MD [phone]     HPI: Sierra Logan is a 68 y.o. female, known to Dr. Brice Logan, but not seen in our office since spring 2022.  She has history of diabetes mellitus, hyperlipidemia, hypertension, neuropathy, and PBC with previously documented esophageal varices, GAVE, and iron deficiency anemia.  She presented to the emergency room last night with complaints of progressive lower extremity swelling and shortness of breath which have progressed over the past couple of weeks.  She says she really just started feeling more short of breath over the past couple of days.  She has not weighed  herself so is uncertain about weight gain.  She does feel that her abdomen is larger as well. No fever or chills at home, nonproductive cough.  She had seen primary care about 10 days ago with edema and was started on Aldactone  50 mg daily.  She has not had any improvement since then. On further questioning she says that her stools have been very dark off and on over the past week or so and that her stool was totally black yesterday.  No nausea or vomiting.  Labs in the ER with WBC of 5.9/hemoglobin 11.3/hematocrit 38.8/MCV 88/platelets 188 BNP 101 Sodium 138/potassium 3.5/BUN 7/creatinine 0.72 LFTs within normal limits INR 1.2/pro time 15.8  Hemoglobin today down to 10.3 and review of her chart shows that her hemoglobin was 12.6 about 10 days ago.  Abdominal imaging Chest x-ray shows significant left basilar infiltrate versus atelectasis and small to moderate left effusion  She last had EGD in April 2022 with documentation of grade 2 varices, diffusely edematous gastric mucosa consistent with portal hypertensive gastropathy and also severe GAVE which was treated with RFA.  She was to return to have repeat EGD in 6 to 8 weeks. Colonoscopy was done at that same setting with poor prep, internal hemorrhoids one 6 mm polyp removed from the transverse colon and also noted diverticulosis. And I asked the patient why she has not followed up with GI, she says she did not know that she was supposed to, and did not seem to correlate these current symptoms with her liver until it was brought up in the ER.  No EtOH use, no regular aspirin or NSAIDs. MELD 3.0: 13 at 11/08/2023  4:52 AM  MELD-Na: 8 at 11/08/2023  4:52 AM Calculated from: Serum Creatinine: 0.61 mg/dL (Using min of 1 mg/dL) at 1/61/0960  4:54 AM Serum Sodium: 136 mmol/L at 11/08/2023  4:52 AM Total Bilirubin: 0.6 mg/dL (Using min of 1 mg/dL) at 0/98/1191  4:78 AM Serum Albumin: 2 g/dL at 2/95/6213  0:86 AM INR(ratio): 1.2 at 11/08/2023  4:09  AM Age at listing (hypothetical): 4 years Sex: Female at 11/08/2023  4:52 AM     Past Medical History:  Diagnosis Date   Cirrhosis (HCC)    Diabetes mellitus without complication (HCC)    Hyperlipemia    Hypertension    Neuropathy    Spleen disease     Past Surgical History:  Procedure Laterality Date   BIOPSY  10/20/2020   Procedure: BIOPSY;  Surgeon: Normie Becton., MD;  Location: [facility];  Service: Gastroenterology;;   COLONOSCOPY WITH PROPOFOL  N/A 10/20/2020   Procedure: COLONOSCOPY WITH PROPOFOL ;  Surgeon: Normie Becton., MD;  Location: [facility];  Service: Gastroenterology;  Laterality: N/A;   ESOPHAGOGASTRODUODENOSCOPY (EGD) WITH PROPOFOL  N/A 10/20/2020   Procedure: ESOPHAGOGASTRODUODENOSCOPY (EGD) WITH PROPOFOL ;  Surgeon: Sierra Logan Albino Alu., MD;  Location: [facility];  Service: Gastroenterology;  Laterality: N/A;   INTRAOPERATIVE TRANSTHORACIC ECHOCARDIOGRAM  10/17/2020   Procedure: TRANSTHORACIC ECHOCARDIOGRAM;  Surgeon: Elmyra Haggard, MD;  Location: [facility];  Service: Cardiovascular;;   POLYPECTOMY  10/20/2020   Procedure: POLYPECTOMY;  Surgeon: Normie Becton., MD;  Location: [facility];  Service: Gastroenterology;;   RADIOFREQUENCY ABLATION  10/20/2020   Procedure: RADIO FREQUENCY ABLATION;  Surgeon: Normie Becton., MD;  Location: [facility];  Service: Gastroenterology;;   RECONSTRUCTION OF NOSE     TRACHEOSTOMY     TRACHEOSTOMY CLOSURE      Prior to Admission medications   Medication Sig Start Date End Date Taking? Authorizing Provider  acetaminophen  (TYLENOL ) 500 MG tablet Take 1,000 mg by mouth every 8 (eight) hours as needed for moderate pain or headache.   Yes [provider]  atorvastatin  (LIPITOR) 10 MG tablet Take 1 tablet (10 mg total) by mouth at bedtime. 12/07/22  Yes Sierra Homestead, MD  BIOTIN PO Take 1 tablet by mouth daily with breakfast.   Yes [provider]  Calcium   Carb-Cholecalciferol (CALCIUM +D3 PO) Take 1 tablet by mouth daily after breakfast.   Yes [provider]  gabapentin  (NEURONTIN ) 400 MG capsule TAKE 1 CAPSULE EVERY MORNING AND 2 AT BEDTIME Patient taking differently: Take 400 mg by mouth 2 (two) times daily. TAKE 1 CAPSULE EVERY MORNING AND 2 AT BEDTIME 04/18/23  Yes Sierra Homestead, MD  Omega-3 Fatty Acids (FISH OIL PO) Take 1 capsule by mouth daily with breakfast.   Yes [provider]  spironolactone  (ALDACTONE ) 25 MG tablet Take 2 tablets (50 mg total) by mouth daily. 10/27/23 10/26/24 Yes Sagardia, Isidro Margo, MD  traZODone  (DESYREL ) 50 MG tablet TAKE 1 TABLET(50 MG) BY MOUTH AT BEDTIME Patient taking differently: Take 50 mg by mouth at bedtime. 06/03/23  Yes Sierra Homestead, MD    Current Facility-Administered Medications  Medication Dose Route Frequency Provider Last Rate Last Admin   acetaminophen  (TYLENOL ) tablet 650 mg  650 mg Oral Q6H PRN Howerter, Justin B, DO   650 mg at 11/08/23 5784   Or   acetaminophen  (TYLENOL ) suppository 650 mg  650 mg Rectal Q6H PRN Howerter, Justin B, DO       albuterol  (PROVENTIL ) (2.5 MG/3ML) 0.083% nebulizer solution 2.5 mg  2.5 mg Nebulization  Q6H PRN Smith, Rondell A, MD       atorvastatin  (LIPITOR) tablet 10 mg  10 mg Oral QHS Smith, Rondell A, MD       cefTRIAXone  (ROCEPHIN ) 1 g in sodium chloride  0.9 % 100 mL IVPB  1 g Intravenous Q24H Esterwood, Amy S, PA-C       furosemide  (LASIX ) injection 40 mg  40 mg Intravenous BID Smith, Rondell A, MD       gabapentin  (NEURONTIN ) capsule 400 mg  400 mg Oral BID Smith, Rondell A, MD       melatonin tablet 3 mg  3 mg Oral QHS PRN Howerter, Justin B, DO       octreotide  (SANDOSTATIN ) 500 mcg in sodium chloride  0.9 % 250 mL (2 mcg/mL) infusion  50 mcg/hr Intravenous Continuous Esterwood, Amy S, PA-C       ondansetron  (ZOFRAN ) injection 4 mg  4 mg Intravenous Q6H PRN Howerter, Justin B, DO       pantoprazole  (PROTONIX ) injection 40 mg   40 mg Intravenous Q12H Smith, Rondell A, MD       sodium chloride  flush (NS) 0.9 % injection 3 mL  3 mL Intravenous Q12H Smith, Rondell A, MD   3 mL at 11/08/23 1059   [START ON 11/09/2023] spironolactone  (ALDACTONE ) tablet 50 mg  50 mg Oral Daily Smith, Rondell A, MD       traZODone  (DESYREL ) tablet 50 mg  50 mg Oral QHS Lena Qualia, MD       Current Outpatient Medications  Medication Sig Dispense Refill   acetaminophen  (TYLENOL ) 500 MG tablet Take 1,000 mg by mouth every 8 (eight) hours as needed for moderate pain or headache.     atorvastatin  (LIPITOR) 10 MG tablet Take 1 tablet (10 mg total) by mouth at bedtime. 90 tablet 3   BIOTIN PO Take 1 tablet by mouth daily with breakfast.     Calcium  Carb-Cholecalciferol (CALCIUM +D3 PO) Take 1 tablet by mouth daily after breakfast.     gabapentin  (NEURONTIN ) 400 MG capsule TAKE 1 CAPSULE EVERY MORNING AND 2 AT BEDTIME (Patient taking differently: Take 400 mg by mouth 2 (two) times daily. TAKE 1 CAPSULE EVERY MORNING AND 2 AT BEDTIME) 270 capsule 3   Omega-3 Fatty Acids (FISH OIL PO) Take 1 capsule by mouth daily with breakfast.     spironolactone  (ALDACTONE ) 25 MG tablet Take 2 tablets (50 mg total) by mouth daily. 720 tablet 0   traZODone  (DESYREL ) 50 MG tablet TAKE 1 TABLET(50 MG) BY MOUTH AT BEDTIME (Patient taking differently: Take 50 mg by mouth at bedtime.) 90 tablet 1    Allergies as of 11/07/2023   (No Known Allergies)    Family History  Problem Relation Age of Onset   GER disease Mother    Heart disease Mother    Diabetes Father    Hypertension Sister    Hypertension Brother    Prostate cancer Maternal Grandfather    Hypertension Brother    Hypertension Brother    Hypertension Brother    Colon cancer Neg Hx    Esophageal cancer Neg Hx    Kidney disease Neg Hx    Liver disease Neg Hx    Pancreatic cancer Neg Hx    Rectal cancer Neg Hx    Stomach cancer Neg Hx     Social History   Socioeconomic History   Marital  status: Married    Spouse name: Not on file   Number of children: 0  Years of education: Not on file   Highest education level: Not on file  Occupational History   Occupation: part time courier  Tobacco Use   Smoking status: Some Days    Current packs/day: 0.50    Average packs/day: 0.5 packs/day for 56.3 years (28.2 ttl pk-yrs)    Types: Cigarettes    Start date: 07/20/1967   Smokeless tobacco: Never   Tobacco comments:    smokes 1 per day.  Vaping Use   Vaping status: Never Used  Substance and Sexual Activity   Alcohol use: Not Currently   Drug use: Never   Sexual activity: Not on file  Other Topics Concern   Not on file  Social History Narrative   Not on file   Social Drivers of Health   Financial Resource Strain: Not on file  Food Insecurity: Not on file  Transportation Needs: Not on file  Physical Activity: Not on file  Stress: Not on file  Social Connections: Not on file  Intimate Partner Violence: Not on file    Review of Systems: Pertinent positive and negative review of systems were noted in the above HPI section.  All other review of systems was otherwise negative.   Physical Exam: Vital signs in last 24 hours: Temp:  [97.6 F (36.4 C)-98.1 F (36.7 C)] 97.8 F (36.6 C) (04/22 1351) Pulse Rate:  [88-104] 98 (04/22 1351) Resp:  [12-18] 12 (04/22 1351) BP: (97-135)/(50-77) 97/61 (04/22 1351) SpO2:  [90 %-97 %] 96 % (04/22 1351) Weight:  [84.8 kg] 84.8 kg (04/21 1537)   General:   Alert,  Well-developed,chronically ill appearing older WF, pleasant and cooperative in NAD-mentation slow but appropriate Head:  Normocephalic and atraumatic. Eyes:  Sclera clear, no icterus.   Conjunctiva pink. Ears:  Normal auditory acuity. Nose:  No deformity, discharge,  or lesions. Mouth:  No deformity or lesions.   Neck:  Supple; no masses or thyromegaly.  JVD Lungs: Evidently decreased breath sounds left lower lung, no rhonchi . Heart:  Regular rate and rhythm; no  murmurs, clicks, rubs,  or gallops. Abdomen: Protuberant, nontender, there is 2+ edema bilateral flanks, no palpable hepatosplenomegaly, positive fluid wave but very full feeling Rectal: Dark heme positive stool Msk:  Symmetrical without gross deformities. . Pulses: 2+ edema bilateral lower extremities to the mid thighs. Extremities: Neurologic:  Alert and  oriented x4;  grossly normal neurologically.  No asterixis, mentation slow but appropriate Skin:  Intact without significant lesions or rashes.. Psych:  Alert and cooperative. Normal mood and affect.  Intake/Output from previous day: No intake/output data recorded. Intake/Output this shift: No intake/output data recorded.  Lab Results: Recent Labs    11/07/23 1607 11/08/23 0452  WBC 5.9 6.4  HGB 11.3* 10.3*  HCT 38.9 33.6*  PLT 188 157   BMET Recent Labs    11/07/23 1607 11/08/23 0452  NA 138 136  K 3.5 3.7  CL 100 102  CO2 28 27  GLUCOSE 85 106*  BUN 7* 6*  CREATININE 0.72 0.61  CALCIUM  8.4* 8.0*   LFT Recent Labs    11/08/23 0452  PROT 6.3*  ALBUMIN  2.0*  AST 34  ALT 19  ALKPHOS 89  BILITOT 0.6   PT/INR Recent Labs    11/08/23 0409  LABPROT 15.3*  INR 1.2     IMPRESSION:  #52 68 year old white female with history of primary biliary cirrhosis, decompensated with grade 2 esophageal varices, portal gastropathy and GAVE documented at EGD in 2022.  She underwent  RFA for treatment of GAVE at that time and was to return for follow-up and for consideration of beta-blocker therapy but has not been seen by GI since.  Now with 2 to 3-week history of progressive lower extremity edema, more frequent falls at home, and shortness of breath which has become more apparent over the past several days.  X-ray shows a moderate left effusion and possible left infiltrate  Exam she has anasarca and likely has a large amount of ascites as well as flank edema  She presents now with significant decompensation of her  cirrhosis.  #2 intermittent dark stools and frankly black stool yesterday with drop in hemoglobin of 2 g over the past 10 days Suspect GI bleeding secondary to GAVE/portal gastropathy but cannot rule out varices as overtly black stool yesterday  #3 diabetes mellitus #4.  History of hypertension #5.  Hyperlipidemia-on Lipitor may need to stop #6 probable component of hepatic encephalopathy   PLAN: 2 g sodium diet-liquids today N.p.o. after midnight Will schedule for EGD with Dr. Nanda Gamm in a.m. tomorrow Procedure was discussed in detail with the patient including indications risks and benefits and she is agreeable to proceed. Trend hemoglobin and transfuse as indicated IV PPI twice daily Added Rocephin  IV added octreotide  until we see what EGD shows Schedule for upper abdominal ultrasound, if significant ascites then will need large-volume paracentesis, and labs to rule out SBP though no indication at present Diuresis has been started with IV Lasix  and currently on low-dose Aldactone  Check AFP Added lactulose  20 gm tid- GI will follow with you     Amy EsterwoodPA-C  11/08/2023, 2:43 PM

## 2023-11-09 ENCOUNTER — Inpatient Hospital Stay (HOSPITAL_COMMUNITY): Admitting: Anesthesiology

## 2023-11-09 ENCOUNTER — Encounter (HOSPITAL_COMMUNITY)
Admission: EM | Disposition: A | Discharge status: Skilled Nursing Facility | Payer: Self-pay | Source: Home / Self Care | Attending: Internal Medicine

## 2023-11-09 ENCOUNTER — Inpatient Hospital Stay (HOSPITAL_COMMUNITY)

## 2023-11-09 ENCOUNTER — Encounter (HOSPITAL_COMMUNITY): Payer: Self-pay | Admitting: Internal Medicine

## 2023-11-09 DIAGNOSIS — K31819 Angiodysplasia of stomach and duodenum without bleeding: Secondary | ICD-10-CM | POA: Diagnosis not present

## 2023-11-09 DIAGNOSIS — I10 Essential (primary) hypertension: Secondary | ICD-10-CM

## 2023-11-09 DIAGNOSIS — I8500 Esophageal varices without bleeding: Secondary | ICD-10-CM | POA: Diagnosis not present

## 2023-11-09 DIAGNOSIS — K3189 Other diseases of stomach and duodenum: Secondary | ICD-10-CM

## 2023-11-09 DIAGNOSIS — F17210 Nicotine dependence, cigarettes, uncomplicated: Secondary | ICD-10-CM | POA: Diagnosis not present

## 2023-11-09 DIAGNOSIS — K921 Melena: Secondary | ICD-10-CM

## 2023-11-09 DIAGNOSIS — E785 Hyperlipidemia, unspecified: Secondary | ICD-10-CM | POA: Diagnosis not present

## 2023-11-09 DIAGNOSIS — K31811 Angiodysplasia of stomach and duodenum with bleeding: Secondary | ICD-10-CM

## 2023-11-09 DIAGNOSIS — I5031 Acute diastolic (congestive) heart failure: Secondary | ICD-10-CM | POA: Diagnosis not present

## 2023-11-09 DIAGNOSIS — K766 Portal hypertension: Secondary | ICD-10-CM

## 2023-11-09 DIAGNOSIS — R601 Generalized edema: Secondary | ICD-10-CM | POA: Diagnosis not present

## 2023-11-09 HISTORY — PX: HOT HEMOSTASIS: SHX5433

## 2023-11-09 HISTORY — PX: ESOPHAGOGASTRODUODENOSCOPY: SHX5428

## 2023-11-09 LAB — BASIC METABOLIC PANEL WITH GFR
Anion gap: 7 (ref 5–15)
BUN: 5 mg/dL — ABNORMAL LOW (ref 8–23)
CO2: 31 mmol/L (ref 22–32)
Calcium: 7.9 mg/dL — ABNORMAL LOW (ref 8.9–10.3)
Chloride: 101 mmol/L (ref 98–111)
Creatinine, Ser: 0.67 mg/dL (ref 0.44–1.00)
GFR, Estimated: >60 mL/min (ref >60)
Glucose, Bld: 100 mg/dL — ABNORMAL HIGH (ref 70–99)
Potassium: 3.6 mmol/L (ref 3.5–5.1)
Sodium: 139 mmol/L (ref 135–145)

## 2023-11-09 LAB — HEMOGLOBIN AND HEMATOCRIT, BLOOD
HCT: 36.6 % (ref 36.0–46.0)
HCT: 37 % (ref 36.0–46.0)
Hemoglobin: 11.1 g/dL — ABNORMAL LOW (ref 12.0–15.0)
Hemoglobin: 11.4 g/dL — ABNORMAL LOW (ref 12.0–15.0)

## 2023-11-09 LAB — ECHOCARDIOGRAM COMPLETE
AR max vel: 2.02 cm2
AV Area VTI: 2.13 cm2
AV Area mean vel: 2.1 cm2
AV Mean grad: 4 mmHg
AV Peak grad: 8 mmHg
Ao pk vel: 1.42 m/s
Area-P 1/2: 4.17 cm2
Height: 62 in
S' Lateral: 2.7 cm
Weight: 2993.6 [oz_av]

## 2023-11-09 LAB — CBC
HCT: 32.7 % — ABNORMAL LOW (ref 36.0–46.0)
Hemoglobin: 10 g/dL — ABNORMAL LOW (ref 12.0–15.0)
MCH: 26.2 pg (ref 26.0–34.0)
MCHC: 30.6 g/dL (ref 30.0–36.0)
MCV: 85.6 fL (ref 80.0–100.0)
Platelets: 158 10*3/uL (ref 150–400)
RBC: 3.82 MIL/uL — ABNORMAL LOW (ref 3.87–5.11)
RDW: 17 % — ABNORMAL HIGH (ref 11.5–15.5)
WBC: 5.6 10*3/uL (ref 4.0–10.5)
nRBC: 0 % (ref 0.0–0.2)

## 2023-11-09 LAB — AMMONIA: Ammonia: 29 umol/L (ref 9–35)

## 2023-11-09 SURGERY — EGD (ESOPHAGOGASTRODUODENOSCOPY)
Anesthesia: Monitor Anesthesia Care

## 2023-11-09 MED ORDER — PROPOFOL 10 MG/ML IV BOLUS
INTRAVENOUS | Status: DC | PRN
  Administered 2023-11-09: 100 ug/kg/min via INTRAVENOUS

## 2023-11-09 MED ORDER — GABAPENTIN 400 MG PO CAPS: 400.0000 mg | ORAL_CAPSULE | Freq: Two times a day (BID) | ORAL | Status: DC

## 2023-11-09 MED ORDER — GABAPENTIN 400 MG PO CAPS
400.0000 mg | ORAL_CAPSULE | Freq: Every day | ORAL | Status: DC
  Administered 2023-11-10 – 2023-11-23 (×13): 400 mg via ORAL
  Filled 2023-11-09 (×14): qty 1

## 2023-11-09 MED ORDER — SODIUM CHLORIDE 0.9 % IV SOLN
INTRAVENOUS | Status: AC | PRN
  Administered 2023-11-09: 250 mL via INTRAVENOUS

## 2023-11-09 MED ORDER — SUCRALFATE 1 GM/10ML PO SUSP
1.0000 g | Freq: Three times a day (TID) | ORAL | Status: DC
  Administered 2023-11-09 – 2023-11-23 (×55): 1 g via ORAL
  Filled 2023-11-09 (×55): qty 10

## 2023-11-09 MED ORDER — GABAPENTIN 400 MG PO CAPS
800.0000 mg | ORAL_CAPSULE | Freq: Every day | ORAL | Status: DC
  Administered 2023-11-09 – 2023-11-22 (×14): 800 mg via ORAL
  Filled 2023-11-09 (×14): qty 2

## 2023-11-09 MED ORDER — PHENYLEPHRINE HCL (PRESSORS) 10 MG/ML IV SOLN
INTRAVENOUS | Status: DC | PRN
  Administered 2023-11-09: 80 ug via INTRAVENOUS

## 2023-11-09 MED ORDER — CARVEDILOL 3.125 MG PO TABS
3.1250 mg | ORAL_TABLET | Freq: Two times a day (BID) | ORAL | Status: DC
  Administered 2023-11-09 – 2023-11-12 (×2): 3.125 mg via ORAL
  Filled 2023-11-09 (×6): qty 1

## 2023-11-09 MED ORDER — SODIUM CHLORIDE 0.9 % IV SOLN: INTRAVENOUS | Status: DC | PRN

## 2023-11-09 MED ORDER — LIDOCAINE 2% (20 MG/ML) 5 ML SYRINGE
INTRAMUSCULAR | Status: DC | PRN
  Administered 2023-11-09: 100 mg via INTRAVENOUS

## 2023-11-09 NOTE — Progress Notes (Signed)
*  PRELIMINARY RESULTS* Echocardiogram 2D Echocardiogram has been performed.  Palmer Bobo 11/09/2023, 9:06 AM

## 2023-11-09 NOTE — Anesthesia Preprocedure Evaluation (Addendum)
 Anesthesia Evaluation  Patient identified by MRN, date of birth, ID band Patient awake    Reviewed: Allergy & Precautions, NPO status , Patient's Chart, lab work & pertinent test results  Airway Mallampati: II  TM Distance: >3 FB Neck ROM: Full    Dental no notable dental hx. (+) Teeth Intact, Dental Advisory Given   Pulmonary shortness of breath, Current Smoker and Patient abstained from smoking., former smoker ? hepatopulm syndrome  On 2L [place]   Pulmonary exam normal breath sounds clear to auscultation       Cardiovascular hypertension, Pt. on medications Normal cardiovascular exam Rhythm:Regular Rate:Normal     Neuro/Psych  Neuromuscular disease    GI/Hepatic ,GERD  ,,(+) Cirrhosis       , Hepatitis -Primary billiary cirrhosis   Endo/Other  diabetes    Renal/GU      Musculoskeletal negative musculoskeletal ROS (+)    Abdominal  (+) + obese  Peds  Hematology  (+) Blood dyscrasia, anemia   Anesthesia Other Findings   Reproductive/Obstetrics                             Anesthesia Physical Anesthesia Plan  ASA: 4  Anesthesia Plan: MAC   Post-op Pain Management: Minimal or no pain anticipated   Induction: Intravenous  PONV Risk Score and Plan: Treatment may vary due to age or medical condition  Airway Management Planned: Nasal Cannula, Natural Airway and Simple Face Mask  Additional Equipment: None  Intra-op Plan:   Post-operative Plan:   Informed Consent: I have reviewed the patients History and Physical, chart, labs and discussed the procedure including the risks, benefits and alternatives for the proposed anesthesia with the patient or authorized representative who has indicated his/her understanding and acceptance.     Dental advisory given  Plan Discussed with: CRNA and Anesthesiologist  Anesthesia Plan Comments: (For colonoscopy)        Anesthesia Quick  Evaluation

## 2023-11-09 NOTE — Interval H&P Note (Signed)
 History and Physical Interval Note:  11/09/2023 11:21 AM  Sierra Logan  has presented today for surgery, with the diagnosis of cirrhosis, melena, hx of varices, hx of GAVE.  The various methods of treatment have been discussed with the patient and family. After consideration of risks, benefits and other options for treatment, the patient has consented to  Procedure(s): EGD (ESOPHAGOGASTRODUODENOSCOPY) (N/A) as a surgical intervention.  The patient's history has been reviewed, patient examined, no change in status, stable for surgery.  I have reviewed the patient's chart and labs.  Questions were answered to the patient's satisfaction.     Tirsa Gail

## 2023-11-09 NOTE — Op Note (Signed)
 [facility] Patient Name: Sierra Logan Procedure Date : 11/09/2023 MRN: [id number] Attending MD: Sergio Dandy , MD, [id number] Date of Birth: 1956/01/03 CSN: [id number] Age: 68 Admit Type: Inpatient Procedure:                Upper GI endoscopy Indications:              Suspected upper gastrointestinal bleeding Providers:                Sergio Dandy, MD, Jacquelyn "Jaci" Bernetta Brilliant, RN,                            Adolfo Hooker, Technician Referring MD:              Medicines:                Monitored Anesthesia Care Complications:            No immediate complications. Estimated Blood Loss:     Estimated blood loss was minimal. Procedure:                Pre-Anesthesia Assessment:                           - Prior to the procedure, a History and Physical                            was performed, and patient medications and                            allergies were reviewed. The patient's tolerance of                            previous anesthesia was also reviewed. The risks                            and benefits of the procedure and the sedation                            options and risks were discussed with the patient.                            All questions were answered, and informed consent                            was obtained. Prior Anticoagulants: The patient has                            taken no anticoagulant or antiplatelet agents. ASA                            Grade Assessment: III - A patient with severe                            systemic disease. After reviewing the risks and  benefits, the patient was deemed in satisfactory                            condition to undergo the procedure.                           After obtaining informed consent, the endoscope was                            passed under direct vision. Throughout the                            procedure, the patient's blood pressure, pulse, and                             oxygen saturations were monitored continuously. The                            [id number]-[id number] ([id number]) Olympus endoscope was introduced                            through the mouth, and advanced to the second part                            of duodenum. The upper GI endoscopy was                            accomplished without difficulty. The patient                            tolerated the procedure well. Scope In: Scope Out: Findings:      Grade II varices were found in the lower third of the esophagus. no       evidence of bleeding. They were less than 5 mm in largest diameter.      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 Coagulation for hemostasis using argon plasma was successful.      Mild portal hypertensive gastropathy was found in the cardia and in the       gastric body.      Patchy mildly erythematous mucosa without active bleeding and with no       stigmata of bleeding was found in the first portion of the duodenum and       in the second portion of the duodenum. Impression:               - Grade II esophageal varices.                           - Gastric antral vascular ectasia with bleeding.                            Treated with argon plasma coagulation (APC).                           -  Portal hypertensive gastropathy.                           - Erythematous duodenopathy.                           - No specimens collected. Recommendation:           - Full liquid diet today, advance to soft diet                            tomorrow                           - Pantoprazole  IV BID during hospitalization, can                            transition to oral on discharge                           - Carafate  1 gm suspension before meals and at                            bedtime X 1 week                           - Continue present  medications.                           - Repeat upper endoscopy in 4 weeks for retreatment.                           - Carvidelol 3.125mg  BID for esophageal varices                            prophylaxis                           - Monitor Hgb and transfuse if below 7                           - DC octreotide                            - Cont Ceftriaxone  for 5 days                           - Diagnostic paracentesis and fluid analysis, will                            plan for tomorrow                           - GI will continue to follow along Procedure Code(s):        --- Professional ---                           43255, Esophagogastroduodenoscopy, flexible,  transoral; with control of bleeding, any method Diagnosis Code(s):        --- Professional ---                           I85.00, Esophageal varices without bleeding                           K31.811, Angiodysplasia of stomach and duodenum                            with bleeding                           K76.6, Portal hypertension                           K31.89, Other diseases of stomach and duodenum CPT copyright 2022 [employer]. All rights reserved. The codes documented in this report are preliminary and upon coder review may  be revised to meet current compliance requirements. Vollie Aaron V. Aela Bohan, MD 11/09/2023 2:29:18 PM This report has been signed electronically. Number of Addenda: 0

## 2023-11-09 NOTE — Progress Notes (Signed)
 PROGRESS NOTE    Sierra Logan  ZOX:[id number] DOB: 1956-05-20 DOA: 11/07/2023 PCP: Adelia Homestead, MD    Brief Narrative:  68 year old with history of hypertension, hyperlipidemia, type 2 diabetes, history of cirrhosis with esophageal varices presented with swelling of legs, feet stomatic and black stool.  Ongoing for about a month.  In the emergency room patient was relatively stable.  Hemoglobin 10.3 with recent hemoglobin of 11.3.  Chest x-ray with moderate severity left basilar atelectasis or infiltrate, moderate-sized left pleural effusion.  FOBT positive.  Patient was admitted with anasarca secondary to liver disease.  Subjective: Patient seen and examined.  Denies any complaints at rest.  She is aware about going to upper GI endoscopy today.  Patient reported multiple loose bowel movements, without any blood or mucus.  Denies any abdominal pain cramping or nausea. Assessment & Plan:   Fluid overload secondary to decompensated cirrhosis and anasarca: Patient with known history of liver cirrhosis.  Today.  Decompensated. Given IV Lasix  x 2, back on Aldactone . Will hold further IV Lasix .  Patient is n.p.o. today.  Will start oral Lasix  tomorrow.  Continue to monitor intake and output.  Mobilize and monitor oxygen. Also checking echocardiogram.  Acute blood loss anemia, history of chronic microcytic anemia due to upper GI bleeding from cirrhosis.  No further drop in hemoglobin. Hemoglobin has remained stable today.  Currently no indication for transfusion.  Remains on IV Protonix , octreotide  and Rocephin  for prophylaxis.  GI planning for upper GI endoscopy today.  Abnormal chest x-ray: Findings likely due to underinflation and pleural effusion secondary to liver disease.  Type 2 diabetes, known A1c 4.8.  Not on any treatment today.  Hyperlipidemia: On a statin.   DVT prophylaxis: SCDs Start: 11/08/23 0853 SCDs Start: 11/08/23 0427   Code Status: Full code Family  Communication: None at the bedside Disposition Plan: Status is: Inpatient Remains inpatient appropriate because: Inpatient procedures planned     Consultants:  Gastroenterology  Procedures:  EGD, planned  Antimicrobials:  Rocephin  4/22--     Objective: Vitals:   11/08/23 2328 11/09/23 0425 11/09/23 0500 11/09/23 0729  BP: 105/67 (!) 99/52  106/66  Pulse: 91 90  90  Resp: 14 15  15   Temp: 98 F (36.7 C) 98.5 F (36.9 C)  98.5 F (36.9 C)  TempSrc: Oral Oral  Oral  SpO2: 98% 96%  94%  Weight:   84.9 kg   Height:        Intake/Output Summary (Last 24 hours) at 11/09/2023 0830 Last data filed at 11/09/2023 0730 Gross per 24 hour  Intake 615.52 ml  Output 900 ml  Net -284.48 ml   Filed Weights   11/07/23 1537 11/09/23 0500  Weight: 84.8 kg 84.9 kg    Examination:  General exam: Appears calm and comfortable  Respiratory system: Clear to auscultation. Respiratory effort normal. No added sounds. SpO2: 94 % O2 Flow Rate (L/min): 2 L/min  Cardiovascular system: S1 & S2 heard, RRR. No JVD, murmurs, rubs, gallops or clicks. No pedal edema. Gastrointestinal system: Soft.  Nontender.  Mildly distended.  No palpable fluid thrill. Central nervous system: Alert and oriented. No focal neurological deficits. Extremities: Symmetric 5 x 5 power. Skin: No rashes, lesions or ulcers Psychiatry: Judgement and insight appear normal. Mood & affect appropriate.     Data Reviewed: I have personally reviewed following labs and imaging studies  CBC: Recent Labs  Lab 11/07/23 1607 11/08/23 0452 11/08/23 1735 11/08/23 1739 11/09/23 0024  WBC 5.9  6.4  --   --   --   NEUTROABS 3.5 4.2  --   --   --   HGB 11.3* 10.3* 11.5* 11.6* 11.4*  HCT 38.9 33.6* 38.0 37.8 37.0  MCV 88.8 85.9  --   --   --   PLT 188 157  --   --   --    Basic Metabolic Panel: Recent Labs  Lab 11/07/23 1607 11/08/23 0452  NA 138 136  K 3.5 3.7  CL 100 102  CO2 28 27  GLUCOSE 85 106*  BUN 7* 6*   CREATININE 0.72 0.61  CALCIUM  8.4* 8.0*  MG  --  1.8   GFR: Estimated Creatinine Clearance: 68.9 mL/min (by C-G formula based on SCr of 0.61 mg/dL). Liver Function Tests: Recent Labs  Lab 11/07/23 1607 11/08/23 0452  AST 36 34  ALT 20 19  ALKPHOS 93 89  BILITOT 0.8 0.6  PROT 6.8 6.3*  ALBUMIN  2.1* 2.0*   No results for input(s): "LIPASE", "AMYLASE" in the last 168 hours. No results for input(s): "AMMONIA" in the last 168 hours. Coagulation Profile: Recent Labs  Lab 11/08/23 0409  INR 1.2   Cardiac Enzymes: No results for input(s): "CKTOTAL", "CKMB", "CKMBINDEX", "TROPONINI" in the last 168 hours. BNP (last 3 results) No results for input(s): "PROBNP" in the last 8760 hours. HbA1C: No results for input(s): "HGBA1C" in the last 72 hours. CBG: No results for input(s): "GLUCAP" in the last 168 hours. Lipid Profile: No results for input(s): "CHOL", "HDL", "LDLCALC", "TRIG", "CHOLHDL", "LDLDIRECT" in the last 72 hours. Thyroid  Function Tests: No results for input(s): "TSH", "T4TOTAL", "FREET4", "T3FREE", "THYROIDAB" in the last 72 hours. Anemia Panel: No results for input(s): "VITAMINB12", "FOLATE", "FERRITIN", "TIBC", "IRON", "RETICCTPCT" in the last 72 hours. Sepsis Labs: Recent Labs  Lab 11/08/23 1739  PROCALCITON <0.10    Recent Results (from the past 240 hours)  MRSA Next Gen by PCR, Nasal     Status: None   Collection Time: 11/08/23  5:17 PM   Specimen: Nasal Mucosa; Nasal Swab  Result Value Ref Range Status   MRSA by PCR Next Gen NOT DETECTED NOT DETECTED Final    Comment: (NOTE) The GeneXpert MRSA Assay (FDA approved for NASAL specimens only), is one component of a comprehensive MRSA colonization surveillance program. It is not intended to diagnose MRSA infection nor to guide or monitor treatment for MRSA infections. Test performance is not FDA approved in patients less than 70 years old. Performed at [facility], [address], [place],  [place] [address]          Radiology Studies: US  Abdomen Complete Result Date: 11/08/2023 CLINICAL DATA:  Cirrhosis of liver with ascites. EXAM: ABDOMEN ULTRASOUND COMPLETE COMPARISON:  Ultrasound abdomen 03/20/2013. CT abdomen and pelvis 2102 chest CT CT a FINDINGS: Gallbladder: Gallstones are present measuring 9 mm. The gallbladder is distended. Gallbladder wall measures up to 3 mm. Negative sonographic Murphy sign. Common bile duct: Diameter: 4.9 mm. Liver: No focal lesion identified. Lobulated contour. Increased echogenicity throughout. Portal vein is patent on color Doppler imaging with normal direction of blood flow towards the liver. IVC: No abnormality visualized. Pancreas: Visualized portion unremarkable. Spleen: Mildly enlarged. Right Kidney: Length: 10.3. Echogenicity within normal limits. No mass or hydronephrosis visualized. Left Kidney: Length: 11.4. Echogenicity within normal limits. No mass or hydronephrosis visualized. Abdominal aorta: Not well seen secondary to overlying bowel gas. Other findings: Ascites is seen in the 4 abdominal quadrants. There is a  2.0 x 0.9 x 1.4 cm lymph node adjacent to the pancreatic head. IMPRESSION: 1. Cirrhotic liver with ascites and mild splenomegaly. 2. Cholelithiasis with mild gallbladder wall thickening. Negative sonographic Murphy sign. 3. Enlarged lymph node adjacent to the pancreatic head. Electronically Signed   By: Tyron Gallon M.D.   On: 11/08/2023 16:26   DG Chest 2 View Result Date: 11/07/2023 CLINICAL DATA:  Shortness of breath. EXAM: CHEST - 2 VIEW COMPARISON:  August 27, 2020 FINDINGS: The heart size and mediastinal contours are within normal limits. There is marked severity calcification of the aortic arch. Mild, diffuse, chronic appearing increased interstitial lung markings are seen. There is opacification of the left lung base. A small to moderate size left pleural effusion is also noted. No pneumothorax is identified. The visualized skeletal  structures are unremarkable. IMPRESSION: 1. Findings likely consistent with marked severity left basilar atelectasis and/or infiltrate. Follow-up with nonemergent chest CT is recommended, as sequelae associated with an underlying neoplastic process cannot be excluded. 2. Small to moderate sized left pleural effusion. Electronically Signed   By: Virgle Grime M.D.   On: 11/07/2023 19:54        Scheduled Meds:  atorvastatin   10 mg Oral QHS   furosemide   40 mg Intravenous BID   gabapentin   400 mg Oral BID   lactulose   30 g Oral TID   pantoprazole  (PROTONIX ) IV  40 mg Intravenous Q12H   sodium chloride  flush  3 mL Intravenous Q12H   spironolactone   50 mg Oral Daily   traZODone   50 mg Oral QHS   Continuous Infusions:  cefTRIAXone  (ROCEPHIN )  IV Stopped (11/08/23 2032)   octreotide  (SANDOSTATIN ) 500 mcg in sodium chloride  0.9 % 250 mL (2 mcg/mL) infusion 50 mcg/hr (11/09/23 0622)     LOS: 1 day    Time spent: 52 minutes    Vada Garibaldi, MD [facility]

## 2023-11-09 NOTE — Progress Notes (Signed)
 Orthopedic Tech Progress Note Patient Details:  Sierra Logan June 04, 1956 [id number]  Ortho Devices Type of Ortho Device: Radio broadcast assistant, Ace wrap Ortho Device/Splint Location: BLE Ortho Device/Splint Interventions: Ordered, Application, Adjustment   Post Interventions Patient Tolerated: Well Instructions Provided: Care of device  Sadi Arave L Jandi Swiger 11/09/2023, 5:03 PM

## 2023-11-09 NOTE — Transfer of Care (Signed)
 Immediate Anesthesia Transfer of Care Note  Patient: Sierra Logan  Procedure(s) Performed: EGD (ESOPHAGOGASTRODUODENOSCOPY) EGD, WITH ARGON PLASMA COAGULATION  Patient Location: PACU  Anesthesia Type:MAC  Level of Consciousness: awake, alert , and oriented  Airway & Oxygen Therapy: Patient Spontanous Breathing and Patient connected to nasal cannula oxygen  Post-op Assessment: Report given to RN and Post -op Vital signs reviewed and stable  Post vital signs: Reviewed and stable  Last Vitals:  Vitals Value Taken Time  BP 94/69 11/09/23 1424  Temp 36.4 C 11/09/23 1424  Pulse 80 11/09/23 1429  Resp 14 11/09/23 1429  SpO2 94 % 11/09/23 1429  Vitals shown include unfiled device data.  Last Pain:  Vitals:   11/09/23 1424  TempSrc: Temporal  PainSc: 0-No pain         Complications: No notable events documented.

## 2023-11-09 NOTE — Plan of Care (Signed)

## 2023-11-10 ENCOUNTER — Inpatient Hospital Stay (HOSPITAL_COMMUNITY)

## 2023-11-10 DIAGNOSIS — R600 Localized edema: Principal | ICD-10-CM

## 2023-11-10 DIAGNOSIS — I8510 Secondary esophageal varices without bleeding: Secondary | ICD-10-CM

## 2023-11-10 DIAGNOSIS — K31819 Angiodysplasia of stomach and duodenum without bleeding: Secondary | ICD-10-CM

## 2023-11-10 DIAGNOSIS — J90 Pleural effusion, not elsewhere classified: Secondary | ICD-10-CM | POA: Diagnosis not present

## 2023-11-10 DIAGNOSIS — K922 Gastrointestinal hemorrhage, unspecified: Secondary | ICD-10-CM | POA: Diagnosis not present

## 2023-11-10 DIAGNOSIS — R601 Generalized edema: Secondary | ICD-10-CM | POA: Diagnosis not present

## 2023-11-10 HISTORY — PX: IR PARACENTESIS: IMG2679

## 2023-11-10 LAB — BODY FLUID CELL COUNT WITH DIFFERENTIAL
Eos, Fluid: 0 %
Lymphs, Fluid: 77 %
Monocyte-Macrophage-Serous Fluid: 20 % — ABNORMAL LOW (ref 50–90)
Neutrophil Count, Fluid: 3 % (ref 0–25)
Total Nucleated Cell Count, Fluid: 364 uL (ref 0–1000)

## 2023-11-10 LAB — GRAM STAIN

## 2023-11-10 LAB — COMPREHENSIVE METABOLIC PANEL WITH GFR
ALT: 19 U/L (ref 0–44)
AST: 32 U/L (ref 15–41)
Albumin: 1.8 g/dL — ABNORMAL LOW (ref 3.5–5.0)
Alkaline Phosphatase: 77 U/L (ref 38–126)
Anion gap: 10 (ref 5–15)
BUN: 7 mg/dL — ABNORMAL LOW (ref 8–23)
CO2: 29 mmol/L (ref 22–32)
Calcium: 8.1 mg/dL — ABNORMAL LOW (ref 8.9–10.3)
Chloride: 103 mmol/L (ref 98–111)
Creatinine, Ser: 0.58 mg/dL (ref 0.44–1.00)
GFR, Estimated: >60 mL/min (ref >60)
Glucose, Bld: 94 mg/dL (ref 70–99)
Potassium: 3.7 mmol/L (ref 3.5–5.1)
Sodium: 142 mmol/L (ref 135–145)
Total Bilirubin: 0.7 mg/dL (ref 0.0–1.2)
Total Protein: 5.8 g/dL — ABNORMAL LOW (ref 6.5–8.1)

## 2023-11-10 LAB — CBC WITH DIFFERENTIAL/PLATELET
Abs Immature Granulocytes: 0.01 10*3/uL (ref 0.00–0.07)
Basophils Absolute: 0 10*3/uL (ref 0.0–0.1)
Basophils Relative: 1 %
Eosinophils Absolute: 0.2 10*3/uL (ref 0.0–0.5)
Eosinophils Relative: 3 %
HCT: 32.9 % — ABNORMAL LOW (ref 36.0–46.0)
Hemoglobin: 9.9 g/dL — ABNORMAL LOW (ref 12.0–15.0)
Immature Granulocytes: 0 %
Lymphocytes Relative: 20 %
Lymphs Abs: 1 10*3/uL (ref 0.7–4.0)
MCH: 26.2 pg (ref 26.0–34.0)
MCHC: 30.1 g/dL (ref 30.0–36.0)
MCV: 87 fL (ref 80.0–100.0)
Monocytes Absolute: 0.5 10*3/uL (ref 0.1–1.0)
Monocytes Relative: 11 %
Neutro Abs: 3.2 10*3/uL (ref 1.7–7.7)
Neutrophils Relative %: 65 %
Platelets: 153 10*3/uL (ref 150–400)
RBC: 3.78 MIL/uL — ABNORMAL LOW (ref 3.87–5.11)
RDW: 16.7 % — ABNORMAL HIGH (ref 11.5–15.5)
WBC: 4.9 10*3/uL (ref 4.0–10.5)
nRBC: 0 % (ref 0.0–0.2)

## 2023-11-10 LAB — MAGNESIUM: Magnesium: 1.5 mg/dL — ABNORMAL LOW (ref 1.7–2.4)

## 2023-11-10 LAB — PHOSPHORUS: Phosphorus: 3.6 mg/dL (ref 2.5–4.6)

## 2023-11-10 LAB — LACTATE DEHYDROGENASE, PLEURAL OR PERITONEAL FLUID: LD, Fluid: 41 U/L — ABNORMAL HIGH (ref 3–23)

## 2023-11-10 LAB — ALBUMIN, PLEURAL OR PERITONEAL FLUID: Albumin, Fluid: <1.5 g/dL

## 2023-11-10 LAB — PROTEIN, PLEURAL OR PERITONEAL FLUID: Total protein, fluid: <3 g/dL

## 2023-11-10 LAB — AFP TUMOR MARKER: AFP, Serum, Tumor Marker: 2.2 ng/mL (ref 0.0–9.2)

## 2023-11-10 MED ORDER — LIDOCAINE HCL 1 % IJ SOLN
20.0000 mL | Freq: Once | INTRAMUSCULAR | Status: AC
  Administered 2023-11-10: 10 mL via INTRADERMAL

## 2023-11-10 MED ORDER — LIDOCAINE HCL 1 % IJ SOLN
INTRAMUSCULAR | Status: AC
  Filled 2023-11-10: qty 20

## 2023-11-10 MED ORDER — MAGNESIUM SULFATE 2 GM/50ML IV SOLN
2.0000 g | Freq: Once | INTRAVENOUS | Status: AC
  Administered 2023-11-10: 2 g via INTRAVENOUS
  Filled 2023-11-10: qty 50

## 2023-11-10 MED ORDER — MIDODRINE HCL 5 MG PO TABS
5.0000 mg | ORAL_TABLET | Freq: Three times a day (TID) | ORAL | Status: DC
  Administered 2023-11-10 – 2023-11-11 (×4): 5 mg via ORAL
  Filled 2023-11-10 (×4): qty 1

## 2023-11-10 NOTE — Progress Notes (Signed)
 Orthopedic Tech Progress Note Patient Details:  Sierra Logan 1956/06/09 [id number] Applied unna boots per order.  Ortho Devices Type of Ortho Device: Radio broadcast assistant Ortho Device/Splint Location: BLE Ortho Device/Splint Interventions: Ordered, Application, Adjustment   Post Interventions Patient Tolerated: Well Instructions Provided: Adjustment of device, Care of device  Rayna Calkin 11/10/2023, 11:46 PM

## 2023-11-10 NOTE — Evaluation (Signed)
 Physical Therapy Evaluation Patient Details Name: Sierra Logan MRN: [id number] DOB: [birth year] Today's Date: 11/10/2023  History of Present Illness  68 year old presented 11/07/23 with swelling of legs, feet, stomach and black stool. Chest x-ray with moderate severity left basilar atelectasis or infiltrate, moderate-sized left pleural effusion. +anasarca; +GI bleed; 4/23 EGD with esophageal varices; 4/24 paracentesis   PMH  hypertension, hyperlipidemia, type 2 diabetes, history of cirrhosis with esophageal varices  Clinical Impression   Pt admitted secondary to problem above with deficits below. PTA patient was living in one level home with 3 steps to enter with her spouse. She reports being independent with mobility, however reports 2 falls in the past week. She reports her legs just gave out. Denied dizziness.  Pt currently requires CGA for standing transfers but unable to progress to ambulation due to orthostatic hypotension and frequent stools. Anticipate patient will benefit from PT to address problems listed below.Will continue to follow acutely to maximize functional mobility independence and safety.  Discussed discharge options and pt currently agreeable to inpatient therapies <3 hrs/day.   Supine BP 102/72 (80) Sitting BP 85/59 (69) Standing BP 79/60 (67)  Supine 89/53 (65)         If plan is discharge home, recommend the following: A little help with walking and/or transfers;A little help with bathing/dressing/bathroom;Assistance with cooking/housework;Assist for transportation;Help with stairs or ramp for entrance   Can travel by private vehicle   No    Equipment Recommendations Other (comment) (TBD; pt reports house too small for use of RW)  Recommendations for Other Services  OT consult    Functional Status Assessment Patient has had a recent decline in their functional status and demonstrates the ability to make significant improvements in function in a reasonable and  predictable amount of time.     Precautions / Restrictions Precautions Precautions: Fall Recall of Precautions/Restrictions: Intact      Mobility  Bed Mobility Overal bed mobility: Needs Assistance Bed Mobility: Supine to Sit     Supine to sit: Min assist     General bed mobility comments: pt able to come to sit EOB but required assist to scoot forward in sitting to reach feet to floor    Transfers Overall transfer level: Needs assistance Equipment used: 1 person hand held assist Transfers: Sit to/from Stand, Bed to chair/wheelchair/BSC Sit to Stand: Contact guard assist   Step pivot transfers: Contact guard assist       General transfer comment: pt reaches for a hand to hold as coming to stand; no physical assist given; bed to [facility] to recliner to bed to [facility] to bed    Ambulation/Gait               General Gait Details: unable due to low BP and frequently stooling  Stairs            Wheelchair Mobility     Tilt Bed    Modified Rankin (Stroke Patients Only)       Balance Overall balance assessment: Needs assistance Sitting-balance support: No upper extremity supported, Feet supported Sitting balance-Leahy Scale: Good     Standing balance support: Single extremity supported Standing balance-Leahy Scale: Poor                               Pertinent Vitals/Pain Pain Assessment Pain Assessment: No/denies pain    Home Living Family/patient expects to be discharged to:: Private residence Living Arrangements: Spouse/significant other  Available Help at Discharge: Family;Available PRN/intermittently Type of Home: House Home Access: Stairs to enter Entrance Stairs-Rails: Left Entrance Stairs-Number of Steps: 3   Home Layout: One level Home Equipment: BSC/3in1      Prior Function Prior Level of Function : Independent/Modified Independent                     Extremity/Trunk Assessment   Upper Extremity  Assessment Upper Extremity Assessment: Generalized weakness    Lower Extremity Assessment Lower Extremity Assessment: Generalized weakness    Cervical / Trunk Assessment Cervical / Trunk Assessment: Normal  Communication   Communication Communication: No apparent difficulties    Cognition Arousal: Alert Behavior During Therapy: WFL for tasks assessed/performed   PT - Cognitive impairments: No apparent impairments                       PT - Cognition Comments: a&ox4; able to answer all questions re: home setup (which matches prior records); able to recall circumstances of recent falls Following commands: Intact       Cueing Cueing Techniques: Verbal cues     General Comments General comments (skin integrity, edema, etc.): BPsupine 102/72 (80), after transfer to [facility] 85/59 (69), after standing for pericare 79/60 (67), on return to supine 89/53 (65)    Exercises     Assessment/Plan    PT Assessment Patient needs continued PT services  PT Problem List Decreased strength;Decreased activity tolerance;Decreased balance;Decreased mobility;Decreased knowledge of use of DME;Cardiopulmonary status limiting activity       PT Treatment Interventions DME instruction;Gait training;Stair training;Functional mobility training;Therapeutic activities;Therapeutic exercise;Balance training;Patient/family education    PT Goals (Current goals can be found in the Care Plan section)  Acute Rehab PT Goals Patient Stated Goal: get better PT Goal Formulation: With patient Time For Goal Achievement: 11/24/23 Potential to Achieve Goals: Good    Frequency Min 2X/week     Co-evaluation               AM-PAC PT "6 Clicks" Mobility  Outcome Measure Help needed turning from your back to your side while in a flat bed without using bedrails?: None Help needed moving from lying on your back to sitting on the side of a flat bed without using bedrails?: A Little Help needed moving to  and from a bed to a chair (including a wheelchair)?: A Little Help needed standing up from a chair using your arms (e.g., wheelchair or bedside chair)?: A Little Help needed to walk in hospital room?: Total Help needed climbing 3-5 steps with a railing? : Total 6 Click Score: 15    End of Session Equipment Utilized During Treatment: Gait belt;Oxygen Activity Tolerance: Treatment limited secondary to medical complications (Comment) (limited by hypotension) Patient left: in bed;with call bell/phone within reach;with bed alarm set Nurse Communication: Mobility status;Other (comment) (drop in BP; recommend SNF) PT Visit Diagnosis: Other abnormalities of gait and mobility (R26.89);Repeated falls (R29.6);Muscle weakness (generalized) (M62.81);Dizziness and giddiness (R42)    Time: 7846-9629 PT Time Calculation (min) (ACUTE ONLY): 50 min   Charges:   PT Evaluation $PT Eval Low Complexity: 1 Low PT Treatments $Therapeutic Activity: 23-37 mins PT General Charges $$ ACUTE PT VISIT: 1 Visit          Gayle Kava, PT Acute Rehabilitation Services  Office [phone]   Guilford Leep 11/10/2023, 4:39 PM

## 2023-11-10 NOTE — Plan of Care (Signed)

## 2023-11-10 NOTE — Progress Notes (Signed)
 SATURATION QUALIFICATIONS: (This note is used to comply with regulatory documentation for home oxygen)  Patient Saturations on Room Air at Rest = 90%  Patient Saturations on Room Air while Ambulating = 87%  Patient Saturations on 2 Liters of oxygen while Ambulating = 94%  Please briefly explain why patient needs home oxygen:  To maintain oxygen saturation >87% during functional activity.    Gayle Kava, PT Acute Rehabilitation Services  Office [phone]

## 2023-11-10 NOTE — Evaluation (Signed)
 Occupational Therapy Evaluation Patient Details Name: Sierra Logan MRN: [id number] DOB: 22-Jun-1956 Today's Date: 11/10/2023   History of Present Illness   68 year old presented 11/07/23 with swelling of legs, feet, stomach and black stool. Chest x-ray with moderate severity left basilar atelectasis or infiltrate, moderate-sized left pleural effusion. +anasarca; +GI bleed; 4/23 EGD with esophageal varices; 4/24 paracentesis   PMH  hypertension, hyperlipidemia, type 2 diabetes, history of cirrhosis with esophageal varices     Clinical Impressions Pt admitted for above, PTA pt reports being ind with ADLs/iADLs and ambulating no AD, living with spouse. Pt currently presenting as generally weak. Pt limited in OOB activity tolerance by low bp and being symptomatic, was able to ambulate to doorway with CGA + RW. Session also limited by frequent need for BM. Pt needing max A to setup A for ADLs. OT to continue to progress pt as able. Anticipating Pt to be able to DC home with [facility] pending improvement of BP, may need skilled rehab if medical status not improving for safe disposition home.      If plan is discharge home, recommend the following:   Assistance with cooking/housework;Assist for transportation     Functional Status Assessment   Patient has had a recent decline in their functional status and demonstrates the ability to make significant improvements in function in a reasonable and predictable amount of time.     Equipment Recommendations   Other (comment) (RW for safety pending progress)     Recommendations for Other Services         Precautions/Restrictions   Precautions Precautions: Fall Recall of Precautions/Restrictions: Intact Restrictions Weight Bearing Restrictions Per Provider Order: No     Mobility Bed Mobility Overal bed mobility: Needs Assistance Bed Mobility: Supine to Sit     Supine to sit: Contact guard          Transfers Overall transfer  level: Needs assistance Equipment used: Rolling walker (2 wheels) Transfers: Sit to/from Stand, Bed to chair/wheelchair/BSC Sit to Stand: Contact guard assist     Step pivot transfers: Contact guard assist     General transfer comment: no physical assist needed. CGA for STS from [facility]      Balance Overall balance assessment: Needs assistance Sitting-balance support: No upper extremity supported, Feet supported Sitting balance-Leahy Scale: Good     Standing balance support: Single extremity supported Standing balance-Leahy Scale: Poor                             ADL either performed or assessed with clinical judgement   ADL Overall ADL's : Needs assistance/impaired Eating/Feeding: Independent;Sitting   Grooming: Sitting;Set up   Upper Body Bathing: Sitting;Set up   Lower Body Bathing: Sitting/lateral leans;Minimal assistance   Upper Body Dressing : Sitting;Set up   Lower Body Dressing: Sitting/lateral leans;Maximal assistance   Toilet Transfer: Contact guard assist;BSC/3in1;Rolling walker (2 wheels);Ambulation   Toileting- Clothing Manipulation and Hygiene: Maximal assistance;Sit to/from stand       Functional mobility during ADLs: Contact guard assist;Rolling walker (2 wheels) (amb 62ft to [facility])       Vision         Perception         Praxis         Pertinent Vitals/Pain Pain Assessment Pain Assessment: No/denies pain (none at the moment, occasionaly in stomach)     Extremity/Trunk Assessment Upper Extremity Assessment Upper Extremity Assessment: Generalized weakness   Lower Extremity Assessment Lower  Extremity Assessment: Generalized weakness   Cervical / Trunk Assessment Cervical / Trunk Assessment: Normal   Communication Communication Communication: No apparent difficulties   Cognition Arousal: Alert Behavior During Therapy: WFL for tasks assessed/performed Cognition: No apparent impairments                                Following commands: Intact       Cueing  General Comments   Cueing Techniques: Verbal cues  sitting on BSC 82/53(60), standing 66/51(57)   Exercises     Shoulder Instructions      Home Living Family/patient expects to be discharged to:: Private residence Living Arrangements: Spouse/significant other Available Help at Discharge: Family;Available PRN/intermittently Type of Home: House Home Access: Stairs to enter [employer] of Steps: 3 Entrance Stairs-Rails: Left Home Layout: One level     Bathroom Shower/Tub: Chief Strategy Officer: Standard     Home Equipment: BSC/3in1   Additional Comments: normally does not wear supplemental 02.      Prior Functioning/Environment Prior Level of Function : Independent/Modified Independent             Mobility Comments: ind ADLs Comments: ind    OT Problem List: Decreased strength;Impaired balance (sitting and/or standing);Decreased activity tolerance   OT Treatment/Interventions: Self-care/ADL training;Balance training;Therapeutic exercise;DME and/or AE instruction;Patient/family education;Therapeutic activities      OT Goals(Current goals can be found in the care plan section)   Acute Rehab OT Goals Patient Stated Goal: To go home OT Goal Formulation: With patient Time For Goal Achievement: 11/24/23 Potential to Achieve Goals: Good ADL Goals Pt Will Perform Grooming: with supervision;standing Pt Will Perform Lower Body Dressing: with supervision;sit to/from stand Pt Will Transfer to Toilet: with supervision;ambulating Pt Will Perform Toileting - Clothing Manipulation and hygiene: with supervision;sit to/from stand   OT Frequency:  Min 2X/week    Co-evaluation              AM-PAC OT "6 Clicks" Daily Activity     Outcome Measure Help from another person eating meals?: None Help from another person taking care of personal grooming?: A Little Help from another person  toileting, which includes using toliet, bedpan, or urinal?: A Lot Help from another person bathing (including washing, rinsing, drying)?: A Little Help from another person to put on and taking off regular upper body clothing?: A Little Help from another person to put on and taking off regular lower body clothing?: A Lot 6 Click Score: 17   End of Session Equipment Utilized During Treatment: Gait belt;Rolling walker (2 wheels) Nurse Communication: Mobility status  Activity Tolerance: Patient tolerated treatment well Patient left: in bed;with call bell/phone within reach;with bed alarm set  OT Visit Diagnosis: Unsteadiness on feet (R26.81);Other abnormalities of gait and mobility (R26.89);Muscle weakness (generalized) (M62.81)                Time: 1610-9604 OT Time Calculation (min): 26 min Charges:  OT General Charges $OT Visit: 1 Visit OT Evaluation $OT Eval Low Complexity: 1 Low OT Treatments $Therapeutic Activity: 8-22 mins  11/10/2023  AB, OTR/L  Acute Rehabilitation Services  Office: [phone]   Jorene New 11/10/2023, 6:30 PM

## 2023-11-10 NOTE — TOC Initial Note (Signed)
 Transition of Care [facility]) - Initial/Assessment Note    Patient Details  Name: Sierra Logan MRN: [id number] Date of Birth: 10/01/1955  [facility] CM/SW Contact:    Juliane Och, LCSW Phone Number: 11/10/2023, 12:24 PM  Clinical Narrative:                  12:24 PM CSW introduced self and role to patient at bedside. Patient confirmed that she resides home with spouse who could provide transportation and home support upon discharge if needed. Patient declined HH/SNF/DME.   Expected Discharge Plan: Home/Self Care Barriers to Discharge: Continued Medical Work up   Patient Goals and CMS Choice Patient states their goals for this hospitalization and ongoing recovery are:: to return home          Expected Discharge Plan and Services       Living arrangements for the past 2 months: Single Family Home                                      Prior Living Arrangements/Services Living arrangements for the past 2 months: Single Family Home Lives with:: Spouse Patient language and need for interpreter reviewed:: Yes Do you feel safe going back to the place where you live?: Yes        Care giver support system in place?: Yes (comment)   Criminal Activity/Legal Involvement Pertinent to Current Situation/Hospitalization: No - Comment as needed  Activities of Daily Living   ADL Screening (condition at time of admission) Independently performs ADLs?: Yes (appropriate for developmental age) Is the patient deaf or have difficulty hearing?: Yes Does the patient have difficulty seeing, even when wearing glasses/contacts?: No Does the patient have difficulty concentrating, remembering, or making decisions?: No  Permission Sought/Granted Permission sought to share information with : Family Supports Permission granted to share information with : No (Contact information on chart)  Share Information with NAME: Myisha Pickerel     Permission granted to share info w  Relationship: Spouse  Permission granted to share info w Contact Information: [phone]  Emotional Assessment Appearance:: Appears stated age Attitude/Demeanor/Rapport: Engaged Affect (typically observed): Accepting, Appropriate, Adaptable, Calm, Stable, Pleasant Orientation: : Oriented to Self, Oriented to Place, Oriented to  Time, Oriented to Situation Alcohol / Substance Use: Not Applicable Psych Involvement: No (comment)  Admission diagnosis:  Anasarca [R60.1] Peripheral edema [R60.0] Pleural effusion [J90] Gastrointestinal hemorrhage, unspecified gastrointestinal hemorrhage type [K92.2] Patient Active Problem List   Diagnosis Date Noted   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) 11/09/2023   Anasarca 11/08/2023   Acute blood loss anemia 11/08/2023   GI bleed 11/08/2023   Abnormal chest x-ray 11/08/2023   GERD (gastroesophageal reflux disease) 11/08/2023   Obesity (BMI 30-39.9) 11/08/2023   Controlled type 2 diabetes mellitus without complication, without long-term current use of insulin (HCC) 12/07/2022   Hyperlipidemia 12/07/2022   Insomnia 12/07/2022   Routine general medical examination at a health care facility 11/10/2021   Pruritus 04/07/2021   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 [place]), [place] - [address] [place] [address] Phone:  [phone] Fax: [phone]     Social Drivers of Health (SDOH) Social History: SDOH Screenings   Food Insecurity: No Food Insecurity (11/08/2023)  Housing: Low Risk  (11/08/2023)  Transportation Needs: No Transportation Needs (11/08/2023)  Utilities: Not At Risk (11/08/2023)  Depression (PHQ2-9): Low Risk  (10/27/2023)  Social Connections: Moderately Isolated (11/08/2023)  Tobacco Use: High Risk (11/09/2023)   SDOH Interventions:     Readmission Risk Interventions     No data to display

## 2023-11-10 NOTE — Progress Notes (Addendum)
 [facility] ROUNDING NOTE   Subjective: Still having melenic appearing BM, s/p paracentesis   Objective: Vital signs in last 24 hours: Temp:  [97.6 F (36.4 C)-98.5 F (36.9 C)] 97.6 F (36.4 C) (04/24 1523) Pulse Rate:  [65-81] 81 (04/24 1145) Resp:  [13-19] 15 (04/24 1145) BP: (76-106)/(44-61) 91/60 (04/24 1523) SpO2:  [91 %-100 %] 97 % (04/24 1145) Weight:  [84.1 kg] 84.1 kg (04/24 0624) Last BM Date : 11/10/23 General: NAD Lungs: b/l decreased breath sounds Heart: s1s2  Abdomen: soft, large ascites Ext: b/l edema    Intake/Output from previous day: 04/23 0701 - 04/24 0700 In: 276.8 [I.V.:276.8] Out: -  Intake/Output this shift: No intake/output data recorded.   Lab Results: Recent Labs    11/08/23 0452 11/08/23 1735 11/09/23 0753 11/09/23 1510 11/10/23 0235  WBC 6.4  --  5.6  --  4.9  HGB 10.3*   < > 10.0* 11.1* 9.9*  PLT 157  --  158  --  153  MCV 85.9  --  85.6  --  87.0   < > = values in this interval not displayed.   BMET Recent Labs    11/08/23 0452 11/09/23 0753 11/10/23 0235  NA 136 139 142  K 3.7 3.6 3.7  CL 102 101 103  CO2 27 31 29   GLUCOSE 106* 100* 94  BUN 6* 5* 7*  CREATININE 0.61 0.67 0.58  CALCIUM  8.0* 7.9* 8.1*   LFT Recent Labs    11/08/23 0452 11/10/23 0235  PROT 6.3* 5.8*  ALBUMIN 2.0* 1.8*  AST 34 32  ALT 19 19  ALKPHOS 89 77  BILITOT 0.6 0.7   PT/INR Recent Labs    11/08/23 0409  INR 1.2      Imaging/Other results: IR Paracentesis Result Date: 11/10/2023 INDICATION: Patient with history of decompensated cirrhosis, ascites. IR consulted for diagnostic and therapeutic paracentesis with 4L max. EXAM: ULTRASOUND GUIDED DIAGNOSTIC AND THERAPEUTIC PARACENTESIS MEDICATIONS: 10 mL 1% lidocaine  COMPLICATIONS: None immediate. PROCEDURE: Informed written consent was obtained from the patient after a discussion of the risks, benefits and alternatives to treatment. A timeout was performed prior to the  initiation of the procedure. Initial ultrasound scanning demonstrates a moderate amount of ascites within the right lower abdominal quadrant. The right lower abdomen was prepped and draped in the usual sterile fashion. 1% lidocaine  was used for local anesthesia. Following this, a 19 gauge, 7-cm, Yueh catheter was introduced. An ultrasound image was saved for documentation purposes. The paracentesis was performed. The catheter was removed and a dressing was applied. The patient tolerated the procedure well without immediate post procedural complication. FINDINGS: A total of approximately 1.85 liters of turbid yellow fluid was removed. Samples were sent to the laboratory as requested by the clinical team. IMPRESSION: Successful ultrasound-guided paracentesis yielding 1.85 liters of peritoneal fluid. Performed by: Wyatt Pommier, PA-C Electronically Signed   By: Myrlene Asper D.O.   On: 11/10/2023 09:04   ECHOCARDIOGRAM COMPLETE Result Date: 11/09/2023    ECHOCARDIOGRAM REPORT   Patient Name:   Sierra Logan Date of Exam: 11/09/2023 Medical Rec #:  [id number]      Height:       62.0 in Accession #:    [id number]     Weight:       187.1 lb Date of Birth:  01/08/1956      BSA:          1.858 m Patient Age:    68 years  BP:           106/66 mmHg Patient Gender: F              HR:           91 bpm. Exam Location:  Inpatient Procedure: 2D Echo, Cardiac Doppler and Color Doppler (Both Spectral and Color            Flow Doppler were utilized during procedure). Indications:    CHF I50.31  History:        Patient has prior history of Echocardiogram examinations, most                 recent 10/17/2020. CHF; Risk Factors:Hypertension, Diabetes,                 Dyslipidemia and Current Smoker.  Sonographer:    Adelia Homestead RVT RCS Referring Phys: [id number] Roxana Copier  Sonographer Comments: Technically difficult study due to poor echo windows. IMPRESSIONS  1. Left ventricular ejection fraction, by estimation, is 60 to 65%.  The left ventricle has normal function. The left ventricle has no regional wall motion abnormalities. Left ventricular diastolic parameters were normal.  2. Right ventricular systolic function is normal. The right ventricular size is normal. Tricuspid regurgitation signal is inadequate for assessing PA pressure.  3. A small pericardial effusion is present. The pericardial effusion is circumferential. Large pleural effusion in the left lateral region.  4. The mitral valve is degenerative. No evidence of mitral valve regurgitation. No evidence of mitral stenosis. Severe mitral annular calcification.  5. The aortic valve has an indeterminant number of cusps. There is moderate calcification of the aortic valve. There is moderate thickening of the aortic valve. Aortic valve regurgitation is mild. Aortic valve sclerosis/calcification is present, without  any evidence of aortic stenosis. Aortic valve area, by VTI measures 2.13 cm. Aortic valve mean gradient measures 4.0 mmHg. Aortic valve Vmax measures 1.42 m/s.  6. The inferior vena cava is dilated in size with >50% respiratory variability, suggesting right atrial pressure of 8 mmHg. FINDINGS  Left Ventricle: Left ventricular ejection fraction, by estimation, is 60 to 65%. The left ventricle has normal function. The left ventricle has no regional wall motion abnormalities. The left ventricular internal cavity size was normal in size. There is  no left ventricular hypertrophy. Left ventricular diastolic parameters were normal. Indeterminate filling pressures. Right Ventricle: The right ventricular size is normal. No increase in right ventricular wall thickness. Right ventricular systolic function is normal. Tricuspid regurgitation signal is inadequate for assessing PA pressure. Left Atrium: Left atrial size was normal in size. Right Atrium: Right atrial size was normal in size. Pericardium: A small pericardial effusion is present. The pericardial effusion is  circumferential. Mitral Valve: The mitral valve is degenerative in appearance. There is mild calcification of the mitral valve leaflet(s). Severe mitral annular calcification. No evidence of mitral valve regurgitation. No evidence of mitral valve stenosis. Tricuspid Valve: The tricuspid valve is normal in structure. Tricuspid valve regurgitation is not demonstrated. No evidence of tricuspid stenosis. Aortic Valve: The aortic valve has an indeterminant number of cusps. There is moderate calcification of the aortic valve. There is moderate thickening of the aortic valve. Aortic valve regurgitation is mild. Aortic valve sclerosis/calcification is present, without any evidence of aortic stenosis. Aortic valve mean gradient measures 4.0 mmHg. Aortic valve peak gradient measures 8.0 mmHg. Aortic valve area, by VTI measures 2.13 cm. Pulmonic Valve: The pulmonic valve was not well visualized. Pulmonic valve regurgitation  is not visualized. No evidence of pulmonic stenosis. Aorta: The aortic root is normal in size and structure. Venous: The inferior vena cava is dilated in size with greater than 50% respiratory variability, suggesting right atrial pressure of 8 mmHg. IAS/Shunts: No atrial level shunt detected by color flow Doppler. Additional Comments: There is a large pleural effusion in the left lateral region.  LEFT VENTRICLE PLAX 2D LVIDd:         5.20 cm   Diastology LVIDs:         2.70 cm   LV e' medial:    8.27 cm/s LV PW:         0.70 cm   LV E/e' medial:  16.1 LV IVS:        1.10 cm   LV e' lateral:   6.74 cm/s LVOT diam:     1.60 cm   LV E/e' lateral: 19.7 LV SV:         58 LV SV Index:   31 LVOT Area:     2.01 cm  RIGHT VENTRICLE             IVC RV Basal diam:  3.20 cm     IVC diam: 2.20 cm RV S prime:     11.90 cm/s LEFT ATRIUM             Index        RIGHT ATRIUM          Index LA Vol (A2C):   44.1 ml 23.73 ml/m  RA Area:     8.50 cm LA Vol (A4C):   32.5 ml 17.49 ml/m  RA Volume:   15.30 ml 8.23 ml/m LA  Biplane Vol: 38.6 ml 20.77 ml/m  AORTIC VALVE                    PULMONIC VALVE AV Area (Vmax):    2.02 cm     PV Vmax:       0.70 m/s AV Area (Vmean):   2.10 cm     PV Peak grad:  1.9 mmHg AV Area (VTI):     2.13 cm AV Vmax:           141.50 cm/s AV Vmean:          94.250 cm/s AV VTI:            0.274 m AV Peak Grad:      8.0 mmHg AV Mean Grad:      4.0 mmHg LVOT Vmax:         142.00 cm/s LVOT Vmean:        98.500 cm/s LVOT VTI:          0.290 m LVOT/AV VTI ratio: 1.06  AORTA Ao Root diam: 2.00 cm MITRAL VALVE MV Area (PHT): 4.17 cm     SHUNTS MV Decel Time: 182 msec     Systemic VTI:  0.29 m MV E velocity: 133.00 cm/s  Systemic Diam: 1.60 cm MV A velocity: 128.00 cm/s MV E/A ratio:  1.04 Gaylyn Keas MD Electronically signed by Gaylyn Keas MD Signature Date/Time: 11/09/2023/1:06:15 PM    Final       Assessment &Plan  68 year old female with 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 Veena Bernisha Verma , MD [phone]  [facility]

## 2023-11-10 NOTE — Progress Notes (Signed)
 PROGRESS NOTE    Sierra Logan  ZOX:[id number] DOB: 10-17-1955 DOA: 11/07/2023 PCP: Adelia Homestead, MD    Brief Narrative:  68 year old with history of hypertension, hyperlipidemia, type 2 diabetes, history of cirrhosis with esophageal varices presented with swelling of legs, feet stomatic and black stool.  Ongoing for about a month.  In the emergency room patient was relatively stable.  Hemoglobin 10.3 with recent hemoglobin of 11.3.  Chest x-ray with moderate severity left basilar atelectasis or infiltrate, moderate-sized left pleural effusion.  FOBT positive.  Patient was admitted with anasarca secondary to liver disease.  Subjective: Patient seen and examined.  Came back from paracentesis.  Blood pressure is low normal.  1.8 L removed. Denies any nausea vomiting.     Assessment & Plan:   Fluid overload secondary to decompensated cirrhosis and anasarca: Patient with known history of liver cirrhosis.   On intermittent dose of Lasix .  Started back on Aldactone .  Started on Coreg  3.125 twice daily. Blood pressure is low. Will add midodrine  5 mg 3 times daily to make more room for diuretics and also for beta-blockers. Mobilize and monitor. Echocardiogram with ejection fraction 60 to 65%.  No regional wall motion abnormality.  Large pleural effusion on the left side. Paracentesis 1.8 L transudate removed.  Cultures pending.  Neutrophils 3.  No evidence of spontaneous bacterial peritonitis. Rocephin  prophylaxis for 5 days. Already on lactulose . Unna boot for the legs.  Acute blood loss anemia, history of chronic microcytic anemia due to upper GI bleeding from cirrhosis.  Hemoglobin dropped to 11-9.  Currently no indication for transfusion.  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  Gastric antral vascular ectasia with bleeding was present.  Cauterized.   Is on IV Protonix , Carafate .  Octreotide  discontinued.  Rocephin  for GI  prophylaxis.    Abnormal chest x-ray: Findings likely due to underinflation and pleural effusion secondary to liver disease.  Type 2 diabetes, known A1c 4.8.  Not on any treatment   Hyperlipidemia: On a statin.  LFTs are normal.  Hypomagnesemia: Replace aggressively today.   DVT prophylaxis: SCDs Start: 11/08/23 0853 SCDs Start: 11/08/23 0427   Code Status: Full code Family Communication: None at the bedside Disposition Plan: Status is: Inpatient Remains inpatient appropriate because: Inpatient procedures ,     Consultants:  Gastroenterology  Procedures:  EGD,   Antimicrobials:  Rocephin  4/22--     Objective: Vitals:   11/09/23 1937 11/09/23 2237 11/10/23 0624 11/10/23 0857  BP: 106/61 (!) 96/57 (!) 90/55 (!) 83/50  Pulse: 69 69 81   Resp: 13 17 16 19   Temp: 97.6 F (36.4 C) 97.6 F (36.4 C) 98 F (36.7 C) 98.5 F (36.9 C)  TempSrc: Oral Oral Oral Oral  SpO2: 99% 97% 95% 99%  Weight:   84.1 kg   Height:        Intake/Output Summary (Last 24 hours) at 11/10/2023 1112 Last data filed at 11/10/2023 0905 Gross per 24 hour  Intake 376.8 ml  Output --  Net 376.8 ml   Filed Weights   11/07/23 1537 11/09/23 0500 11/10/23 0624  Weight: 84.8 kg 84.9 kg 84.1 kg    Examination:  General exam: Appears calm and comfortable.  Chronically sick looking.  Not in any distress. Respiratory system: Clear to auscultation. Respiratory effort normal. No added sounds.  Poor air entry at bases. SpO2: 99 % O2 Flow Rate (L/min): 2 L/min  Cardiovascular system: S1 &  S2 heard, RRR. No JVD, murmurs, rubs, gallops or clicks.  3+ pedal edema, now treated with Unna boot. Gastrointestinal system: Soft.  Nontender.  Bowel sound present.  No palpable fluid thrill. Central nervous system: Alert and oriented. No focal neurological deficits. Extremities: Symmetric 5 x 5 power. Skin: No rashes, lesions or ulcers Psychiatry: Judgement and insight appear normal. Mood & affect  appropriate.     Data Reviewed: I have personally reviewed following labs and imaging studies  CBC: Recent Labs  Lab 11/07/23 1607 11/08/23 0452 11/08/23 1735 11/08/23 1739 11/09/23 0024 11/09/23 0753 11/09/23 1510 11/10/23 0235  WBC 5.9 6.4  --   --   --  5.6  --  4.9  NEUTROABS 3.5 4.2  --   --   --   --   --  3.2  HGB 11.3* 10.3*   < > 11.6* 11.4* 10.0* 11.1* 9.9*  HCT 38.9 33.6*   < > 37.8 37.0 32.7* 36.6 32.9*  MCV 88.8 85.9  --   --   --  85.6  --  87.0  PLT 188 157  --   --   --  158  --  153   < > = values in this interval not displayed.   Basic Metabolic Panel: Recent Labs  Lab 11/07/23 1607 11/08/23 0452 11/09/23 0753 11/10/23 0235  NA 138 136 139 142  K 3.5 3.7 3.6 3.7  CL 100 102 101 103  CO2 28 27 31 29   GLUCOSE 85 106* 100* 94  BUN 7* 6* 5* 7*  CREATININE 0.72 0.61 0.67 0.58  CALCIUM  8.4* 8.0* 7.9* 8.1*  MG  --  1.8  --  1.5*  PHOS  --   --   --  3.6   GFR: Estimated Creatinine Clearance: 68.6 mL/min (by C-G formula based on SCr of 0.58 mg/dL). Liver Function Tests: Recent Labs  Lab 11/07/23 1607 11/08/23 0452 11/10/23 0235  AST 36 34 32  ALT 20 19 19   ALKPHOS 93 89 77  BILITOT 0.8 0.6 0.7  PROT 6.8 6.3* 5.8*  ALBUMIN 2.1* 2.0* 1.8*   No results for input(s): "LIPASE", "AMYLASE" in the last 168 hours. Recent Labs  Lab 11/09/23 0753  AMMONIA 29   Coagulation Profile: Recent Labs  Lab 11/08/23 0409  INR 1.2   Cardiac Enzymes: No results for input(s): "CKTOTAL", "CKMB", "CKMBINDEX", "TROPONINI" in the last 168 hours. BNP (last 3 results) No results for input(s): "PROBNP" in the last 8760 hours. HbA1C: No results for input(s): "HGBA1C" in the last 72 hours. CBG: No results for input(s): "GLUCAP" in the last 168 hours. Lipid Profile: No results for input(s): "CHOL", "HDL", "LDLCALC", "TRIG", "CHOLHDL", "LDLDIRECT" in the last 72 hours. Thyroid  Function Tests: No results for input(s): "TSH", "T4TOTAL", "FREET4", "T3FREE",  "THYROIDAB" in the last 72 hours. Anemia Panel: No results for input(s): "VITAMINB12", "FOLATE", "FERRITIN", "TIBC", "IRON", "RETICCTPCT" in the last 72 hours. Sepsis Labs: Recent Labs  Lab 11/08/23 1739  PROCALCITON <0.10    Recent Results (from the past 240 hours)  MRSA Next Gen by PCR, Nasal     Status: None   Collection Time: 11/08/23  5:17 PM   Specimen: Nasal Mucosa; Nasal Swab  Result Value Ref Range Status   MRSA by PCR Next Gen NOT DETECTED NOT DETECTED Final    Comment: (NOTE) The GeneXpert MRSA Assay (FDA approved for NASAL specimens only), is one component of a comprehensive MRSA colonization surveillance program. It is not intended to diagnose MRSA  infection nor to guide or monitor treatment for MRSA infections. Test performance is not FDA approved in patients less than 30 years old. Performed at [facility], [address], [place], [place] [address]          Radiology Studies: IR Paracentesis Result Date: 11/10/2023 INDICATION: Patient with history of decompensated cirrhosis, ascites. IR consulted for diagnostic and therapeutic paracentesis with 4L max. EXAM: ULTRASOUND GUIDED DIAGNOSTIC AND THERAPEUTIC PARACENTESIS MEDICATIONS: 10 mL 1% lidocaine  COMPLICATIONS: None immediate. PROCEDURE: Informed written consent was obtained from the patient after a discussion of the risks, benefits and alternatives to treatment. A timeout was performed prior to the initiation of the procedure. Initial ultrasound scanning demonstrates a moderate amount of ascites within the right lower abdominal quadrant. The right lower abdomen was prepped and draped in the usual sterile fashion. 1% lidocaine  was used for local anesthesia. Following this, a 19 gauge, 7-cm, Yueh catheter was introduced. An ultrasound image was saved for documentation purposes. The paracentesis was performed. The catheter was removed and a dressing was applied. The patient tolerated the procedure well without  immediate post procedural complication. FINDINGS: A total of approximately 1.85 liters of turbid yellow fluid was removed. Samples were sent to the laboratory as requested by the clinical team. IMPRESSION: Successful ultrasound-guided paracentesis yielding 1.85 liters of peritoneal fluid. Performed by: Wyatt Pommier, PA-C Electronically Signed   By: Myrlene Asper D.O.   On: 11/10/2023 09:04   ECHOCARDIOGRAM COMPLETE Result Date: 11/09/2023    ECHOCARDIOGRAM REPORT   Patient Name:   JAIMY KLIETHERMES Date of Exam: 11/09/2023 Medical Rec #:  [id number]      Height:       62.0 in Accession #:    [id number]     Weight:       187.1 lb Date of Birth:  1956-06-15      BSA:          1.858 m Patient Age:    67 years       BP:           106/66 mmHg Patient Gender: F              HR:           91 bpm. Exam Location:  Inpatient Procedure: 2D Echo, Cardiac Doppler and Color Doppler (Both Spectral and Color            Flow Doppler were utilized during procedure). Indications:    CHF I50.31  History:        Patient has prior history of Echocardiogram examinations, most                 recent 10/17/2020. CHF; Risk Factors:Hypertension, Diabetes,                 Dyslipidemia and Current Smoker.  Sonographer:    Adelia Homestead RVT RCS Referring Phys: [id number] Roxana Copier  Sonographer Comments: Technically difficult study due to poor echo windows. IMPRESSIONS  1. Left ventricular ejection fraction, by estimation, is 60 to 65%. The left ventricle has normal function. The left ventricle has no regional wall motion abnormalities. Left ventricular diastolic parameters were normal.  2. Right ventricular systolic function is normal. The right ventricular size is normal. Tricuspid regurgitation signal is inadequate for assessing PA pressure.  3. A small pericardial effusion is present. The pericardial effusion is circumferential. Large pleural effusion in the left lateral region.  4. The mitral valve is degenerative. No evidence of  mitral  valve regurgitation. No evidence of mitral stenosis. Severe mitral annular calcification.  5. The aortic valve has an indeterminant number of cusps. There is moderate calcification of the aortic valve. There is moderate thickening of the aortic valve. Aortic valve regurgitation is mild. Aortic valve sclerosis/calcification is present, without  any evidence of aortic stenosis. Aortic valve area, by VTI measures 2.13 cm. Aortic valve mean gradient measures 4.0 mmHg. Aortic valve Vmax measures 1.42 m/s.  6. The inferior vena cava is dilated in size with >50% respiratory variability, suggesting right atrial pressure of 8 mmHg. FINDINGS  Left Ventricle: Left ventricular ejection fraction, by estimation, is 60 to 65%. The left ventricle has normal function. The left ventricle has no regional wall motion abnormalities. The left ventricular internal cavity size was normal in size. There is  no left ventricular hypertrophy. Left ventricular diastolic parameters were normal. Indeterminate filling pressures. Right Ventricle: The right ventricular size is normal. No increase in right ventricular wall thickness. Right ventricular systolic function is normal. Tricuspid regurgitation signal is inadequate for assessing PA pressure. Left Atrium: Left atrial size was normal in size. Right Atrium: Right atrial size was normal in size. Pericardium: A small pericardial effusion is present. The pericardial effusion is circumferential. Mitral Valve: The mitral valve is degenerative in appearance. There is mild calcification of the mitral valve leaflet(s). Severe mitral annular calcification. No evidence of mitral valve regurgitation. No evidence of mitral valve stenosis. Tricuspid Valve: The tricuspid valve is normal in structure. Tricuspid valve regurgitation is not demonstrated. No evidence of tricuspid stenosis. Aortic Valve: The aortic valve has an indeterminant number of cusps. There is moderate calcification of the aortic valve.  There is moderate thickening of the aortic valve. Aortic valve regurgitation is mild. Aortic valve sclerosis/calcification is present, without any evidence of aortic stenosis. Aortic valve mean gradient measures 4.0 mmHg. Aortic valve peak gradient measures 8.0 mmHg. Aortic valve area, by VTI measures 2.13 cm. Pulmonic Valve: The pulmonic valve was not well visualized. Pulmonic valve regurgitation is not visualized. No evidence of pulmonic stenosis. Aorta: The aortic root is normal in size and structure. Venous: The inferior vena cava is dilated in size with greater than 50% respiratory variability, suggesting right atrial pressure of 8 mmHg. IAS/Shunts: No atrial level shunt detected by color flow Doppler. Additional Comments: There is a large pleural effusion in the left lateral region.  LEFT VENTRICLE PLAX 2D LVIDd:         5.20 cm   Diastology LVIDs:         2.70 cm   LV e' medial:    8.27 cm/s LV PW:         0.70 cm   LV E/e' medial:  16.1 LV IVS:        1.10 cm   LV e' lateral:   6.74 cm/s LVOT diam:     1.60 cm   LV E/e' lateral: 19.7 LV SV:         58 LV SV Index:   31 LVOT Area:     2.01 cm  RIGHT VENTRICLE             IVC RV Basal diam:  3.20 cm     IVC diam: 2.20 cm RV S prime:     11.90 cm/s LEFT ATRIUM             Index        RIGHT ATRIUM          Index  LA Vol (A2C):   44.1 ml 23.73 ml/m  RA Area:     8.50 cm LA Vol (A4C):   32.5 ml 17.49 ml/m  RA Volume:   15.30 ml 8.23 ml/m LA Biplane Vol: 38.6 ml 20.77 ml/m  AORTIC VALVE                    PULMONIC VALVE AV Area (Vmax):    2.02 cm     PV Vmax:       0.70 m/s AV Area (Vmean):   2.10 cm     PV Peak grad:  1.9 mmHg AV Area (VTI):     2.13 cm AV Vmax:           141.50 cm/s AV Vmean:          94.250 cm/s AV VTI:            0.274 m AV Peak Grad:      8.0 mmHg AV Mean Grad:      4.0 mmHg LVOT Vmax:         142.00 cm/s LVOT Vmean:        98.500 cm/s LVOT VTI:          0.290 m LVOT/AV VTI ratio: 1.06  AORTA Ao Root diam: 2.00 cm MITRAL VALVE MV  Area (PHT): 4.17 cm     SHUNTS MV Decel Time: 182 msec     Systemic VTI:  0.29 m MV E velocity: 133.00 cm/s  Systemic Diam: 1.60 cm MV A velocity: 128.00 cm/s MV E/A ratio:  1.04 Gaylyn Keas MD Electronically signed by Gaylyn Keas MD Signature Date/Time: 11/09/2023/1:06:15 PM    Final    US  Abdomen Complete Result Date: 11/08/2023 CLINICAL DATA:  Cirrhosis of liver with ascites. EXAM: ABDOMEN ULTRASOUND COMPLETE COMPARISON:  Ultrasound abdomen 03/20/2013. CT abdomen and pelvis 2102 chest CT CT a FINDINGS: Gallbladder: Gallstones are present measuring 9 mm. The gallbladder is distended. Gallbladder wall measures up to 3 mm. Negative sonographic Murphy sign. Common bile duct: Diameter: 4.9 mm. Liver: No focal lesion identified. Lobulated contour. Increased echogenicity throughout. Portal vein is patent on color Doppler imaging with normal direction of blood flow towards the liver. IVC: No abnormality visualized. Pancreas: Visualized portion unremarkable. Spleen: Mildly enlarged. Right Kidney: Length: 10.3. Echogenicity within normal limits. No mass or hydronephrosis visualized. Left Kidney: Length: 11.4. Echogenicity within normal limits. No mass or hydronephrosis visualized. Abdominal aorta: Not well seen secondary to overlying bowel gas. Other findings: Ascites is seen in the 4 abdominal quadrants. There is a 2.0 x 0.9 x 1.4 cm lymph node adjacent to the pancreatic head. IMPRESSION: 1. Cirrhotic liver with ascites and mild splenomegaly. 2. Cholelithiasis with mild gallbladder wall thickening. Negative sonographic Murphy sign. 3. Enlarged lymph node adjacent to the pancreatic head. Electronically Signed   By: Tyron Gallon M.D.   On: 11/08/2023 16:26        Scheduled Meds:  atorvastatin   10 mg Oral QHS   carvedilol   3.125 mg Oral BID WC   gabapentin   400 mg Oral Daily   And   gabapentin   800 mg Oral QHS   lactulose   30 g Oral TID   midodrine   5 mg Oral TID WC   pantoprazole  (PROTONIX ) IV  40 mg  Intravenous Q12H   sodium chloride  flush  3 mL Intravenous Q12H   spironolactone   50 mg Oral Daily   sucralfate   1 g Oral TID WC & HS   traZODone   50 mg Oral QHS   Continuous Infusions:  cefTRIAXone  (ROCEPHIN )  IV 1 g (11/09/23 2037)     LOS: 2 days       Vada Garibaldi, MD [facility]

## 2023-11-10 NOTE — Procedures (Signed)
 PROCEDURE SUMMARY:  Successful ultrasound guided paracentesis from the right lower quadrant.  Yielded 1.85 liters of turbid yellow fluid.  No immediate complications.  The patient tolerated the procedure well.   Specimen was sent for labs.  EBL < 5mL  If the patient eventually requires >/=2 paracenteses in a 30 day period, screening evaluation by the [facility] will be assessed.  Karmelo Bass, PA-C

## 2023-11-11 DIAGNOSIS — R600 Localized edema: Secondary | ICD-10-CM | POA: Diagnosis not present

## 2023-11-11 DIAGNOSIS — D62 Acute posthemorrhagic anemia: Secondary | ICD-10-CM | POA: Diagnosis not present

## 2023-11-11 DIAGNOSIS — K922 Gastrointestinal hemorrhage, unspecified: Secondary | ICD-10-CM | POA: Diagnosis not present

## 2023-11-11 DIAGNOSIS — R601 Generalized edema: Secondary | ICD-10-CM | POA: Diagnosis not present

## 2023-11-11 LAB — CBC WITH DIFFERENTIAL/PLATELET
Abs Immature Granulocytes: 0.01 10*3/uL (ref 0.00–0.07)
Basophils Absolute: 0.1 10*3/uL (ref 0.0–0.1)
Basophils Relative: 1 %
Eosinophils Absolute: 0.2 10*3/uL (ref 0.0–0.5)
Eosinophils Relative: 4 %
HCT: 35.7 % — ABNORMAL LOW (ref 36.0–46.0)
Hemoglobin: 10.5 g/dL — ABNORMAL LOW (ref 12.0–15.0)
Immature Granulocytes: 0 %
Lymphocytes Relative: 20 %
Lymphs Abs: 1.2 10*3/uL (ref 0.7–4.0)
MCH: 26.1 pg (ref 26.0–34.0)
MCHC: 29.4 g/dL — ABNORMAL LOW (ref 30.0–36.0)
MCV: 88.6 fL (ref 80.0–100.0)
Monocytes Absolute: 0.5 10*3/uL (ref 0.1–1.0)
Monocytes Relative: 9 %
Neutro Abs: 4.2 10*3/uL (ref 1.7–7.7)
Neutrophils Relative %: 66 %
Platelets: 164 10*3/uL (ref 150–400)
RBC: 4.03 MIL/uL (ref 3.87–5.11)
RDW: 16.2 % — ABNORMAL HIGH (ref 11.5–15.5)
WBC: 6.2 10*3/uL (ref 4.0–10.5)
nRBC: 0 % (ref 0.0–0.2)

## 2023-11-11 LAB — COMPREHENSIVE METABOLIC PANEL WITH GFR
ALT: 19 U/L (ref 0–44)
AST: 33 U/L (ref 15–41)
Albumin: 2 g/dL — ABNORMAL LOW (ref 3.5–5.0)
Alkaline Phosphatase: 79 U/L (ref 38–126)
Anion gap: 6 (ref 5–15)
BUN: 9 mg/dL (ref 8–23)
CO2: 32 mmol/L (ref 22–32)
Calcium: 8.2 mg/dL — ABNORMAL LOW (ref 8.9–10.3)
Chloride: 103 mmol/L (ref 98–111)
Creatinine, Ser: 0.56 mg/dL (ref 0.44–1.00)
GFR, Estimated: >60 mL/min (ref >60)
Glucose, Bld: 112 mg/dL — ABNORMAL HIGH (ref 70–99)
Potassium: 3.6 mmol/L (ref 3.5–5.1)
Sodium: 141 mmol/L (ref 135–145)
Total Bilirubin: 0.6 mg/dL (ref 0.0–1.2)
Total Protein: 6.8 g/dL (ref 6.5–8.1)

## 2023-11-11 LAB — MAGNESIUM: Magnesium: 1.9 mg/dL (ref 1.7–2.4)

## 2023-11-11 MED ORDER — FUROSEMIDE 40 MG PO TABS
40.0000 mg | ORAL_TABLET | Freq: Every day | ORAL | Status: DC
  Administered 2023-11-11 – 2023-11-13 (×3): 40 mg via ORAL
  Filled 2023-11-11 (×3): qty 1

## 2023-11-11 MED ORDER — SPIRONOLACTONE 25 MG PO TABS
100.0000 mg | ORAL_TABLET | Freq: Every day | ORAL | Status: DC
  Administered 2023-11-12 – 2023-11-13 (×2): 100 mg via ORAL
  Filled 2023-11-11 (×2): qty 4

## 2023-11-11 MED ORDER — ZINC OXIDE 40 % EX OINT
TOPICAL_OINTMENT | Freq: Three times a day (TID) | CUTANEOUS | Status: DC
  Administered 2023-11-15: 1 via TOPICAL
  Filled 2023-11-11 (×3): qty 57

## 2023-11-11 MED ORDER — MIDODRINE HCL 5 MG PO TABS
10.0000 mg | ORAL_TABLET | Freq: Three times a day (TID) | ORAL | Status: DC
  Administered 2023-11-11 – 2023-11-15 (×11): 10 mg via ORAL
  Filled 2023-11-11 (×11): qty 2

## 2023-11-11 NOTE — Care Management Important Message (Signed)
 Important Message  Patient Details  Name: Sierra Logan MRN: [id number] Date of Birth: 25-Aug-1955   Important Message Given:  Yes - Medicare IM     [name] 11/11/2023, 3:23 PM

## 2023-11-11 NOTE — Progress Notes (Signed)
 Mobility Specialist Progress Note;   11/11/23 1048  Mobility  Activity Stood at bedside;Transferred to/from [facility]  Level of Assistance Contact guard assist, steadying assist  Assistive Device Front wheel walker  Distance Ambulated (ft) 3 ft  Activity Response Tolerated fair  Mobility Referral Yes  Mobility visit 1 Mobility  Mobility Specialist Start Time (ACUTE ONLY) 1048  Mobility Specialist Stop Time (ACUTE ONLY) 1103  Mobility Specialist Time Calculation (min) (ACUTE ONLY) 15 min   Pt agreeable to mobility. Required MinG assistance for safety. Requested assistance to [employer], BM successful. Pericare performed by MS. Took Bps throughout session, see below. Pt was asx w/ no complaints. VSS on 2LO2. Pt returned back to bed with all needs met.   Supine: BP 100/78 (86) Sitting: BP 89/71 (78) Standing: BP 93/51 (64) Returned back to supine after activity: BP 109/44 (63)  Sierra Logan Mobility Specialist Please contact via Special educational needs teacher or [employer] [phone]

## 2023-11-11 NOTE — Anesthesia Postprocedure Evaluation (Signed)
 Anesthesia Post Note  Patient: Sierra Logan  Procedure(s) Performed: EGD (ESOPHAGOGASTRODUODENOSCOPY) EGD, WITH ARGON PLASMA COAGULATION     Patient location during evaluation: PACU Anesthesia Type: MAC Level of consciousness: awake and alert Pain management: pain level controlled Vital Signs Assessment: post-procedure vital signs reviewed and stable Respiratory status: spontaneous breathing, nonlabored ventilation, respiratory function stable and patient connected to nasal cannula oxygen Cardiovascular status: stable and blood pressure returned to baseline Postop Assessment: no apparent nausea or vomiting Anesthetic complications: no   No notable events documented.  Last Vitals:  Vitals:   11/10/23 2200 11/10/23 2309  BP: 103/77 (!) 95/54  Pulse: 77 71  Resp: 12 14  Temp:  36.5 C  SpO2: 98% 99%    Last Pain:  Vitals:   11/11/23 0050  TempSrc:   PainSc: 0-No pain   Pain Goal:                   Sierra Logan

## 2023-11-11 NOTE — Plan of Care (Signed)

## 2023-11-11 NOTE — Progress Notes (Signed)
  GASTROENTEROLOGY ROUNDING NOTE   Subjective: Patient is having fairly frequent stools. Stools are still slightly black appearing. BLE edema is improved. Abdomen soft  Objective: Vital signs in last 24 hours: Temp:  [97.6 F (36.4 C)-97.9 F (36.6 C)] 97.7 F (36.5 C) (04/25 1120) Pulse Rate:  [69-86] 77 (04/25 1120) Resp:  [11-19] 17 (04/25 1120) BP: (86-103)/(44-77) 86/44 (04/25 1120) SpO2:  [93 %-99 %] 95 % (04/25 1120) Last BM Date : 11/11/23 General: NAD Lungs: on 1 L [place] oxygen Heart: regular rate Abdomen: soft, some distension present Ext: 1+ b/l edema  Intake/Output from previous day: 04/24 0701 - 04/25 0700 In: 570 [P.O.:420; IV Piggyback:150] Out: -  Intake/Output this shift: Total I/O In: 100 [IV Piggyback:100] Out: -    Lab Results: Recent Labs    11/09/23 0753 11/09/23 1510 11/10/23 0235 11/11/23 0835  WBC 5.6  --  4.9 6.2  HGB 10.0* 11.1* 9.9* 10.5*  PLT 158  --  153 164  MCV 85.6  --  87.0 88.6   BMET Recent Labs    11/09/23 0753 11/10/23 0235 11/11/23 0835  NA 139 142 141  K 3.6 3.7 3.6  CL 101 103 103  CO2 31 29 32  GLUCOSE 100* 94 112*  BUN 5* 7* 9  CREATININE 0.67 0.58 0.56  CALCIUM  7.9* 8.1* 8.2*   LFT Recent Labs    11/10/23 0235 11/11/23 0835  PROT 5.8* 6.8  ALBUMIN  1.8* 2.0*  AST 32 33  ALT 19 19  ALKPHOS 77 79  BILITOT 0.7 0.6   PT/INR No results for input(s): "INR" in the last 72 hours.     Imaging/Other results: IR Paracentesis Result Date: 11/10/2023 INDICATION: Patient with history of decompensated cirrhosis, ascites. IR consulted for diagnostic and therapeutic paracentesis with 4L max. EXAM: ULTRASOUND GUIDED DIAGNOSTIC AND THERAPEUTIC PARACENTESIS MEDICATIONS: 10 mL 1% lidocaine  COMPLICATIONS: None immediate. PROCEDURE: Informed written consent was obtained from the patient after a discussion of the risks, benefits and alternatives to treatment. A timeout was performed prior to the initiation of the  procedure. Initial ultrasound scanning demonstrates a moderate amount of ascites within the right lower abdominal quadrant. The right lower abdomen was prepped and draped in the usual sterile fashion. 1% lidocaine  was used for local anesthesia. Following this, a 19 gauge, 7-cm, Yueh catheter was introduced. An ultrasound image was saved for documentation purposes. The paracentesis was performed. The catheter was removed and a dressing was applied. The patient tolerated the procedure well without immediate post procedural complication. FINDINGS: A total of approximately 1.85 liters of turbid yellow fluid was removed. Samples were sent to the laboratory as requested by the clinical team. IMPRESSION: Successful ultrasound-guided paracentesis yielding 1.85 liters of peritoneal fluid. Performed by: Wyatt Pommier, PA-C Electronically Signed   By: Myrlene Asper D.O.   On: 11/10/2023 09:04    Assessment &Plan  68 year old Logan with decompensated cirrhosis secondary to PBC admitted with volume overload, melena and anemia MELD 3.0: 12 at 11/10/2023  2:35 AM MELD-Na: 8 at 11/10/2023  2:35 AM Calculated from: Serum Creatinine: 0.58 mg/dL (Using min of 1 mg/dL) at 1/61/0960  4:54 AM Serum Sodium: 142 mmol/L (Using max of 137 mmol/L) at 11/10/2023  2:35 AM Total Bilirubin: 0.7 mg/dL (Using min of 1 mg/dL) at 0/98/1191  4:78 AM Serum Albumin : 1.8 g/dL at 2/95/6213  0:86 AM INR(ratio): 1.2 at 11/08/2023  4:09 AM Age at listing (hypothetical): 52 years Sex: Logan at 11/10/2023  2:35 AM  S/p EGD with  APC of GAVE, non bleeding esophageal varices Will need repeat EGD in 4 weeks for surveillance Hb has stabilized PPI BID Cont Carvedilol   Volume overload: Titrate up diuretics as tolerated Increased diuretics to Aldactone  100mg  and lasix  40mg  daily Monitor BMP Will increase midodrine  to 10 mg TID to help with BP support  Ascites negative for SBP  Cont Lactulose  for hepatic encephalopathy  GI will continue to  follow. Mann/[employer] to cover service tmrw.   Regino Caprio, MD [phone]  [facility]

## 2023-11-11 NOTE — [place] FL2 (Signed)
 [place]  MEDICAID FL2 LEVEL OF CARE FORM     IDENTIFICATION  Patient Name: Sierra Logan Birthdate: 02-Jul-1956 Sex: female Admission Date (Current Location): 11/07/2023  [facility] and [place] Number:  Producer, television/film/video and Address:  [facility], 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) 11/09/2023   Anasarca 11/08/2023   Acute blood loss anemia 11/08/2023   GI bleed 11/08/2023   Abnormal chest x-ray 11/08/2023   GERD (gastroesophageal reflux disease) 11/08/2023   Obesity (BMI 30-39.9) 11/08/2023   Controlled type 2 diabetes mellitus without complication, without long-term current use of insulin (HCC) 12/07/2022   Hyperlipidemia 12/07/2022   Insomnia 12/07/2022   Routine general medical examination at a health care facility 11/10/2021   Pruritus 04/07/2021   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   Secondary esophageal varices without bleeding (HCC) 12/03/2020   Aortic atherosclerosis (HCC) 11/20/2020   Tobacco abuse 11/20/2020   Concern for possible hepatopulmonary syndrome (HCC) 10/07/2020   Chronic respiratory failure with hypoxia (HCC) 10/07/2020   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   Iron deficiency anemia 08/28/2020   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   Peripheral neuropathy 08/28/2020   Decompensated cirrhosis (HCC) 08/28/2020   Lung nodule 08/28/2020    Orientation  RESPIRATION BLADDER Height & Weight     Self, Time, Situation, Place  O2 (1L nasal cannula) Continent Weight: 185 lb 4.8 oz (84.1 kg) (Scale B) Height:  5\' 2"  (157.5 cm)  BEHAVIORAL SYMPTOMS/MOOD NEUROLOGICAL BOWEL NUTRITION STATUS      Continent Diet (Please see dc summary)  AMBULATORY STATUS COMMUNICATION OF NEEDS Skin   Limited Assist Verbally Normal                       Personal Care Assistance Level of Assistance  Bathing, Feeding, Dressing Bathing Assistance: Limited assistance Feeding assistance: Limited assistance Dressing Assistance: Limited assistance     Functional Limitations Info  Hearing   Hearing Info: Impaired (R and L)      SPECIAL CARE FACTORS FREQUENCY  PT (By licensed PT), OT (By licensed OT)     PT Frequency: 5x OT Frequency: 5x            Contractures Contractures Info: Not present    Additional Factors Info  Code Status, Allergies, Insulin Sliding Scale Code Status Info: Full Code Allergies Info: NKA   Insulin Sliding Scale Info: Please see dc summary       Current Medications (11/11/2023):  This is the current hospital active medication list Current Facility-Administered Medications  Medication Dose Route Frequency Provider Last Rate Last Admin   acetaminophen  (TYLENOL ) tablet 650 mg  650 mg Oral Q6H PRN Howerter, Justin B, DO   650 mg at 11/08/23 0756   Or   acetaminophen  (TYLENOL ) suppository 650 mg  650 mg Rectal Q6H PRN Howerter, Justin B, DO       albuterol  (PROVENTIL ) (2.5 MG/3ML) 0.083% nebulizer  solution 2.5 mg  2.5 mg Nebulization Q6H PRN Smith, Rondell A, MD       atorvastatin  (LIPITOR) tablet 10 mg  10 mg Oral QHS Smith, Rondell A, MD   10 mg at 11/10/23 2106   carvedilol  (COREG ) tablet 3.125 mg  3.125 mg Oral BID WC Ghimire, Kuber, MD   3.125 mg at 11/09/23 1702   furosemide  (LASIX ) tablet 40 mg  40 mg Oral Daily Daina Drum, MD       gabapentin  (NEURONTIN ) capsule 400 mg  400 mg Oral Daily Ghimire, Kuber, MD   400 mg  at 11/11/23 1610   And   gabapentin  (NEURONTIN ) capsule 800 mg  800 mg Oral QHS Ghimire, Kuber, MD   800 mg at 11/10/23 2106   lactulose  (CHRONULAC ) 10 GM/15ML solution 30 g  30 g Oral TID Esterwood, Amy S, PA-C   30 g at 11/11/23 0805   melatonin tablet 3 mg  3 mg Oral QHS PRN Howerter, Justin B, DO   3 mg at 11/09/23 2054   midodrine  (PROAMATINE ) tablet 10 mg  10 mg Oral TID WC Dorsey, Ying C, MD       ondansetron  (ZOFRAN ) injection 4 mg  4 mg Intravenous Q6H PRN Howerter, Justin B, DO       pantoprazole  (PROTONIX ) injection 40 mg  40 mg Intravenous Q12H Smith, Rondell A, MD   40 mg at 11/11/23 9604   sodium chloride  flush (NS) 0.9 % injection 3 mL  3 mL Intravenous Q12H Smith, Rondell A, MD   3 mL at 11/10/23 2107   [START ON 11/12/2023] spironolactone  (ALDACTONE ) tablet 100 mg  100 mg Oral Daily Daina Drum, MD       sucralfate  (CARAFATE ) 1 GM/10ML suspension 1 g  1 g Oral TID WC & HS Nandigam, Kavitha V, MD   1 g at 11/11/23 1155   traZODone  (DESYREL ) tablet 50 mg  50 mg Oral QHS Smith, Rondell A, MD   50 mg at 11/10/23 2106     Discharge Medications: Please see discharge summary for a list of discharge medications.  Relevant Imaging Results:  Relevant Lab Results:   Additional Information SS# [id number]  Juliane Och, LCSW

## 2023-11-11 NOTE — TOC Progression Note (Signed)
 Transition of Care [facility]) - Progression Note    Patient Details  Name: Sierra Logan MRN: [id number] Date of Birth: [birth year]  [facility]) CM/SW Contact  Juliane Och, LCSW Phone Number: 11/11/2023, 12:16 PM  Clinical Narrative:     12:16 PM CSW returned to patient's bedside and informed her of physical therapy recommendation of discharge to SNF. Patient was agreeable and consented CSW to submit referrals to SNFs within [facility].  Expected Discharge Plan: Skilled Nursing Facility Barriers to Discharge: Continued Medical Work up, English as a second language teacher, SNF Pending bed offer  Expected Discharge Plan and Services In-house Referral: Clinical Social Work   Post Acute Care Choice: Skilled Nursing Facility Living arrangements for the past 2 months: Single Family Home                                       Social Determinants of Health (SDOH) Interventions SDOH Screenings   Food Insecurity: No Food Insecurity (11/08/2023)  Housing: Low Risk  (11/08/2023)  Transportation Needs: No Transportation Needs (11/08/2023)  Utilities: Not At Risk (11/08/2023)  Depression (PHQ2-9): Low Risk  (10/27/2023)  Social Connections: Moderately Isolated (11/08/2023)  Tobacco Use: High Risk (11/09/2023)    Readmission Risk Interventions     No data to display

## 2023-11-11 NOTE — Progress Notes (Signed)
 PROGRESS NOTE    ARELIZ ROTHMAN  MVH:[id number] DOB: 08-22-1955 DOA: 11/07/2023 PCP: Adelia Homestead, MD    Brief Narrative:  68 year old with history of hypertension, hyperlipidemia, type 2 diabetes, history of cirrhosis with esophageal varices presented with swelling of legs, feet stomatic and black stool.  Ongoing for about a month.  In the emergency room patient was relatively stable.  Hemoglobin 10.3 with recent hemoglobin of 11.3.  Chest x-ray with moderate severity left basilar atelectasis or infiltrate, moderate-sized left pleural effusion.  FOBT positive.  Patient was admitted with anasarca secondary to liver disease.  Subjective:  Patient seen and examined.  Herself denies any complaints.  She felt dizziness after walking in the hallway yesterday.  Blood pressure dropped to 80s but quickly recovered.  Remains low normal. Denies any nausea vomiting.  Has poor appetite. Multiple loose bowel movements some of them are oily and black-colored.  Hemoglobin 10.5.   Assessment & Plan:   Fluid overload secondary to decompensated cirrhosis and anasarca: Patient with known history of liver cirrhosis.   On intermittent dose of Lasix .  Started back on Aldactone .  Started on Coreg  3.125 twice daily. Blood pressure is low.  Added midodrine  5 mg 3 times daily to make more room for diuretics and also for beta-blockers. Mobilize and monitor. Echocardiogram with ejection fraction 60 to 65%.  No regional wall motion abnormality.  Large pleural effusion on the left side. Paracentesis 1.8 L transudate removed.  Negative for SBP. Discontinue Rocephin . Already on lactulose . Unna boot for the legs.  Acute blood loss anemia, history of chronic microcytic anemia due to upper GI bleeding from cirrhosis.  Hemoglobin dropped to 05-27-09.5.  Currently no indication for transfusion.  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  Gastric antral  vascular ectasia with bleeding was present.  Cauterized.   Is on IV Protonix , Carafate .  Octreotide  discontinued.  Will continue to monitor.  Abnormal chest x-ray: Findings likely due to underinflation and pleural effusion secondary to liver disease.  Type 2 diabetes, known A1c 4.8.  Not on any treatment   Hyperlipidemia: On a statin.  LFTs are normal.  Hypomagnesemia: Replaced.  Adequate.  Continue to work with PT OT.  If adequate improvement, anticipate home with home health PT OT.   DVT prophylaxis: SCDs Start: 11/08/23 0853 SCDs Start: 11/08/23 0427   Code Status: Full code Family Communication: None at the bedside Disposition Plan: Status is: Inpatient Remains inpatient appropriate because: Injectable medications, Protonix , monitoring,     Consultants:  Gastroenterology  Procedures:  EGD,   Antimicrobials:  Rocephin  4/22--4/25     Objective: Vitals:   11/10/23 2309 11/11/23 0436 11/11/23 0753 11/11/23 0800  BP: (!) 95/54 (!) 90/49 (!) 93/46 (!) 91/52  Pulse: 71 69 78 86  Resp: 14 14 11 19   Temp: 97.7 F (36.5 C) 97.6 F (36.4 C) 97.6 F (36.4 C)   TempSrc: Oral Oral Oral   SpO2: 99% 95% 94% 93%  Weight:      Height:        Intake/Output Summary (Last 24 hours) at 11/11/2023 1049 Last data filed at 11/11/2023 0726 Gross per 24 hour  Intake 570 ml  Output --  Net 570 ml   Filed Weights   11/07/23 1537 11/09/23 0500 11/10/23 0624  Weight: 84.8 kg 84.9 kg 84.1 kg    Examination:  General exam: Fairly comfortable at rest.  On 2 L oxygen.  Frail. Respiratory  system: Clear to auscultation. Respiratory effort normal. No added sounds.  Poor air entry at bases. SpO2: 93 % O2 Flow Rate (L/min): 2 L/min  Cardiovascular system: S1 & S2 heard, RRR. No JVD, murmurs, rubs, gallops or clicks.   pedal edema, now treated with Unna boot. Gastrointestinal system: Soft.  Nontender.  Bowel sound present.  No palpable fluid thrill. Central nervous system: Alert  and oriented. No focal neurological deficits. Extremities: Symmetric 5 x 5 power. Skin: No rashes, lesions or ulcers Psychiatry: Judgement and insight appear normal. Mood & affect appropriate.     Data Reviewed: I have personally reviewed following labs and imaging studies  CBC: Recent Labs  Lab 11/07/23 1607 11/08/23 0452 11/08/23 1735 11/09/23 0024 11/09/23 0753 11/09/23 1510 11/10/23 0235 11/11/23 0835  WBC 5.9 6.4  --   --  5.6  --  4.9 6.2  NEUTROABS 3.5 4.2  --   --   --   --  3.2 4.2  HGB 11.3* 10.3*   < > 11.4* 10.0* 11.1* 9.9* 10.5*  HCT 38.9 33.6*   < > 37.0 32.7* 36.6 32.9* 35.7*  MCV 88.8 85.9  --   --  85.6  --  87.0 88.6  PLT 188 157  --   --  158  --  153 164   < > = values in this interval not displayed.   Basic Metabolic Panel: Recent Labs  Lab 11/07/23 1607 11/08/23 0452 11/09/23 0753 11/10/23 0235 11/11/23 0835  NA 138 136 139 142 141  K 3.5 3.7 3.6 3.7 3.6  CL 100 102 101 103 103  CO2 28 27 31 29  32  GLUCOSE 85 106* 100* 94 112*  BUN 7* 6* 5* 7* 9  CREATININE 0.72 0.61 0.67 0.58 0.56  CALCIUM  8.4* 8.0* 7.9* 8.1* 8.2*  MG  --  1.8  --  1.5* 1.9  PHOS  --   --   --  3.6  --    GFR: Estimated Creatinine Clearance: 68 mL/min (by C-G formula based on SCr of 0.56 mg/dL). Liver Function Tests: Recent Labs  Lab 11/07/23 1607 11/08/23 0452 11/10/23 0235 11/11/23 0835  AST 36 34 32 33  ALT 20 19 19 19   ALKPHOS 93 89 77 79  BILITOT 0.8 0.6 0.7 0.6  PROT 6.8 6.3* 5.8* 6.8  ALBUMIN 2.1* 2.0* 1.8* 2.0*   No results for input(s): "LIPASE", "AMYLASE" in the last 168 hours. Recent Labs  Lab 11/09/23 0753  AMMONIA 29   Coagulation Profile: Recent Labs  Lab 11/08/23 0409  INR 1.2   Cardiac Enzymes: No results for input(s): "CKTOTAL", "CKMB", "CKMBINDEX", "TROPONINI" in the last 168 hours. BNP (last 3 results) No results for input(s): "PROBNP" in the last 8760 hours. HbA1C: No results for input(s): "HGBA1C" in the last 72  hours. CBG: No results for input(s): "GLUCAP" in the last 168 hours. Lipid Profile: No results for input(s): "CHOL", "HDL", "LDLCALC", "TRIG", "CHOLHDL", "LDLDIRECT" in the last 72 hours. Thyroid  Function Tests: No results for input(s): "TSH", "T4TOTAL", "FREET4", "T3FREE", "THYROIDAB" in the last 72 hours. Anemia Panel: No results for input(s): "VITAMINB12", "FOLATE", "FERRITIN", "TIBC", "IRON", "RETICCTPCT" in the last 72 hours. Sepsis Labs: Recent Labs  Lab 11/08/23 1739  PROCALCITON <0.10    Recent Results (from the past 240 hours)  MRSA Next Gen by PCR, Nasal     Status: None   Collection Time: 11/08/23  5:17 PM   Specimen: Nasal Mucosa; Nasal Swab  Result Value Ref Range Status  MRSA by PCR Next Gen NOT DETECTED NOT DETECTED Final    Comment: (NOTE) The GeneXpert MRSA Assay (FDA approved for NASAL specimens only), is one component of a comprehensive MRSA colonization surveillance program. It is not intended to diagnose MRSA infection nor to guide or monitor treatment for MRSA infections. Test performance is not FDA approved in patients less than 27 years old. Performed at [facility], [address] [place], [place] [address]   Culture, body fluid w Gram Stain-bottle     Status: None (Preliminary result)   Collection Time: 11/10/23  8:25 AM   Specimen: Peritoneal Washings  Result Value Ref Range Status   Specimen Description PERITONEAL  Final   Special Requests NONE  Final   Culture   Final    NO GROWTH < 24 HOURS Performed at [facility], [address], [place], [place] [address]    Report Status PENDING  Incomplete  Gram stain     Status: None   Collection Time: 11/10/23  8:30 AM   Specimen: Peritoneal Washings  Result Value Ref Range Status   Specimen Description PERITONEAL  Final   Special Requests NONE  Final   Gram Stain   Final    CYTOSPIN SMEAR WBC SEEN NO ORGANISMS SEEN Performed at [facility], [address],  [place], [place] [address]    Report Status 11/10/2023 FINAL  Final         Radiology Studies: IR Paracentesis Result Date: 11/10/2023 INDICATION: Patient with history of decompensated cirrhosis, ascites. IR consulted for diagnostic and therapeutic paracentesis with 4L max. EXAM: ULTRASOUND GUIDED DIAGNOSTIC AND THERAPEUTIC PARACENTESIS MEDICATIONS: 10 mL 1% lidocaine  COMPLICATIONS: None immediate. PROCEDURE: Informed written consent was obtained from the patient after a discussion of the risks, benefits and alternatives to treatment. A timeout was performed prior to the initiation of the procedure. Initial ultrasound scanning demonstrates a moderate amount of ascites within the right lower abdominal quadrant. The right lower abdomen was prepped and draped in the usual sterile fashion. 1% lidocaine  was used for local anesthesia. Following this, a 19 gauge, 7-cm, Yueh catheter was introduced. An ultrasound image was saved for documentation purposes. The paracentesis was performed. The catheter was removed and a dressing was applied. The patient tolerated the procedure well without immediate post procedural complication. FINDINGS: A total of approximately 1.85 liters of turbid yellow fluid was removed. Samples were sent to the laboratory as requested by the clinical team. IMPRESSION: Successful ultrasound-guided paracentesis yielding 1.85 liters of peritoneal fluid. Performed by: Wyatt Pommier, PA-C Electronically Signed   By: Myrlene Asper D.O.   On: 11/10/2023 09:04        Scheduled Meds:  atorvastatin   10 mg Oral QHS   carvedilol   3.125 mg Oral BID WC   gabapentin   400 mg Oral Daily   And   gabapentin   800 mg Oral QHS   lactulose   30 g Oral TID   midodrine   5 mg Oral TID WC   pantoprazole  (PROTONIX ) IV  40 mg Intravenous Q12H   sodium chloride  flush  3 mL Intravenous Q12H   spironolactone   50 mg Oral Daily   sucralfate   1 g Oral TID WC & HS   traZODone   50 mg Oral QHS   Continuous  Infusions:     LOS: 3 days       Vada Garibaldi, MD [facility]

## 2023-11-12 DIAGNOSIS — R601 Generalized edema: Secondary | ICD-10-CM | POA: Diagnosis not present

## 2023-11-12 LAB — BPAM RBC
Blood Product Expiration Date: 202505162359
Blood Product Expiration Date: 202505172359
Unit Type and Rh: 5100
Unit Type and Rh: 5100

## 2023-11-12 LAB — CBC WITH DIFFERENTIAL/PLATELET
Abs Immature Granulocytes: 0.01 10*3/uL (ref 0.00–0.07)
Basophils Absolute: 0 10*3/uL (ref 0.0–0.1)
Basophils Relative: 1 %
Eosinophils Absolute: 0 10*3/uL (ref 0.0–0.5)
Eosinophils Relative: 0 %
HCT: 29.3 % — ABNORMAL LOW (ref 36.0–46.0)
Hemoglobin: 9 g/dL — ABNORMAL LOW (ref 12.0–15.0)
Immature Granulocytes: 0 %
Lymphocytes Relative: 24 %
Lymphs Abs: 1.3 10*3/uL (ref 0.7–4.0)
MCH: 26.6 pg (ref 26.0–34.0)
MCHC: 30.7 g/dL (ref 30.0–36.0)
MCV: 86.7 fL (ref 80.0–100.0)
Monocytes Absolute: 0.6 10*3/uL (ref 0.1–1.0)
Monocytes Relative: 11 %
Neutro Abs: 3.3 10*3/uL (ref 1.7–7.7)
Neutrophils Relative %: 64 %
Platelets: 119 10*3/uL — ABNORMAL LOW (ref 150–400)
RBC: 3.38 MIL/uL — ABNORMAL LOW (ref 3.87–5.11)
RDW: 15.9 % — ABNORMAL HIGH (ref 11.5–15.5)
WBC: 5.2 10*3/uL (ref 4.0–10.5)
nRBC: 0.4 % — ABNORMAL HIGH (ref 0.0–0.2)

## 2023-11-12 LAB — COMPREHENSIVE METABOLIC PANEL WITH GFR
ALT: 17 U/L (ref 0–44)
AST: 26 U/L (ref 15–41)
Albumin: 1.7 g/dL — ABNORMAL LOW (ref 3.5–5.0)
Alkaline Phosphatase: 71 U/L (ref 38–126)
Anion gap: 8 (ref 5–15)
BUN: 10 mg/dL (ref 8–23)
CO2: 30 mmol/L (ref 22–32)
Calcium: 7.7 mg/dL — ABNORMAL LOW (ref 8.9–10.3)
Chloride: 101 mmol/L (ref 98–111)
Creatinine, Ser: 0.59 mg/dL (ref 0.44–1.00)
GFR, Estimated: >60 mL/min (ref >60)
Glucose, Bld: 111 mg/dL — ABNORMAL HIGH (ref 70–99)
Potassium: 3.4 mmol/L — ABNORMAL LOW (ref 3.5–5.1)
Sodium: 139 mmol/L (ref 135–145)
Total Bilirubin: 0.4 mg/dL (ref 0.0–1.2)
Total Protein: 5.4 g/dL — ABNORMAL LOW (ref 6.5–8.1)

## 2023-11-12 LAB — TYPE AND SCREEN
ABO/RH(D): O POS
Antibody Screen: POSITIVE
Donor AG Type: NEGATIVE
Donor AG Type: NEGATIVE
Unit division: 0
Unit division: 0

## 2023-11-12 MED ORDER — POTASSIUM CHLORIDE CRYS ER 20 MEQ PO TBCR
20.0000 meq | EXTENDED_RELEASE_TABLET | Freq: Two times a day (BID) | ORAL | Status: AC
  Administered 2023-11-12 (×2): 20 meq via ORAL
  Filled 2023-11-12 (×2): qty 1

## 2023-11-12 MED ORDER — PANTOPRAZOLE SODIUM 40 MG PO TBEC
40.0000 mg | DELAYED_RELEASE_TABLET | Freq: Two times a day (BID) | ORAL | Status: DC
  Administered 2023-11-12 – 2023-11-23 (×22): 40 mg via ORAL
  Filled 2023-11-12 (×22): qty 1

## 2023-11-12 MED ORDER — LACTULOSE 10 GM/15ML PO SOLN
30.0000 g | Freq: Two times a day (BID) | ORAL | Status: DC
  Administered 2023-11-12: 30 g via ORAL
  Filled 2023-11-12: qty 45

## 2023-11-12 MED ORDER — ALBUMIN HUMAN 25 % IV SOLN
12.5000 g | Freq: Four times a day (QID) | INTRAVENOUS | Status: AC
  Administered 2023-11-12 – 2023-11-13 (×4): 12.5 g via INTRAVENOUS
  Filled 2023-11-12 (×4): qty 50

## 2023-11-12 NOTE — Plan of Care (Signed)

## 2023-11-12 NOTE — Progress Notes (Addendum)
 Mobility Specialist Progress Note;   11/12/23 1302  Mobility  Activity Ambulated with assistance in room  Level of Assistance Contact guard assist, steadying assist  Assistive Device Front wheel walker  Distance Ambulated (ft) 10 ft  Activity Response Tolerated well  Mobility Referral Yes  Mobility visit 1 Mobility  Mobility Specialist Start Time (ACUTE ONLY) 1302  Mobility Specialist Stop Time (ACUTE ONLY) 1311  Mobility Specialist Time Calculation (min) (ACUTE ONLY) 9 min   Pt agreeable to mobility, in chair upon arrival. Still limited in safe mobility d/t BP readings and being symptomatic. Pt able to stand w/ MinG assistance and take some steps forward and backwards. VC required throughout for safety. VSS on 1LO2. BP at end of mobility was 108/83 (91). Pt left in chair with all needs met, alarm on.   Janit Meline Mobility Specialist Please contact via SecureChat or [employer] [phone]

## 2023-11-12 NOTE — Progress Notes (Signed)
 PROGRESS NOTE    Sierra Logan  ZOX:[id number] DOB: 01/18/1956 DOA: 11/07/2023 PCP: Adelia Homestead, MD    Brief Narrative:  68 year old with history of hypertension, hyperlipidemia, type 2 diabetes, history of cirrhosis with esophageal varices presented with swelling of legs, feet stomatic and black stool.  Ongoing for about a month.  In the emergency room patient was relatively stable.  Hemoglobin 10.3 with recent hemoglobin of 11.3.  Chest x-ray with moderate severity left basilar atelectasis or infiltrate, moderate-sized left pleural effusion.  FOBT positive.  Patient was admitted with anasarca secondary to liver disease. Remains with low blood pressures.  Difficult to maintain diuretics.  Subjective:  Patient seen and examined.  Defecated 13 times, loose watery stool since last 24 hours.  Feels dizzy.  Appetite is poor but denies any nausea vomiting.  No abdominal pain.  Urine output difficult to measure due to loose stool. Albumin x 4 Midodrine  10 mg 3 times daily Coreg  with holding parameters.   Assessment & Plan:   Fluid overload secondary to decompensated cirrhosis and anasarca: Patient with known history of liver cirrhosis.   Lasix  40 mg daily, spironolactone  100 mg daily, carvedilol  3.125 mg daily with holding parameters. Midodrine  10 mg 3 times daily to make room for diuresis. Holding lactulose  today due to too many bowel movements. Echocardiogram with ejection fraction 60 to 65%.  No regional wall motion abnormality.  Large pleural effusion on the left side. Paracentesis 1.8 L transudate removed.  Negative for SBP. Antibiotics discontinued. Unna boot for the legs.  Acute blood loss anemia, history of chronic microcytic anemia due to upper GI bleeding from cirrhosis.  Hemoglobin dropped to 05-27-09.5-9.  Currently no indication for transfusion.  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  Gastric antral vascular  ectasia with bleeding was present.  Cauterized.   Is on IV Protonix , Carafate .  Octreotide  discontinued.  Will continue to monitor.  Abnormal chest x-ray: Findings likely due to underinflation and pleural effusion secondary to liver disease.  Type 2 diabetes, known A1c 4.8.  Not on any treatment   Hyperlipidemia: On a statin.  LFTs are normal.  Hypomagnesemia: Replaced.  Adequate.  Continue to work with PT OT.  If adequate improvement, anticipate home with home health PT OT.   DVT prophylaxis: SCDs Start: 11/08/23 0853 SCDs Start: 11/08/23 0427   Code Status: Full code Family Communication: None at the bedside Disposition Plan: Status is: Inpatient Remains inpatient appropriate because: Injectable medications, Protonix , monitoring,     Consultants:  Gastroenterology  Procedures:  EGD,   Antimicrobials:  Rocephin  4/22--4/25     Objective: Vitals:   11/12/23 0428 11/12/23 0500 11/12/23 0637 11/12/23 0801  BP: (!) 93/45 (!) 98/46    Pulse: 83 87    Resp: 13 16  18   Temp: 98.3 F (36.8 C)   97.8 F (36.6 C)  TempSrc: Oral   Axillary  SpO2: 93% 92%    Weight:   83.4 kg   Height:        Intake/Output Summary (Last 24 hours) at 11/12/2023 1035 Last data filed at 11/12/2023 0901 Gross per 24 hour  Intake 480 ml  Output --  Net 480 ml   Filed Weights   11/09/23 0500 11/10/23 0624 11/12/23 0637  Weight: 84.9 kg 84.1 kg 83.4 kg    Examination:  General exam: Fairly comfortable at rest.  Frail and debilitated.  On minimal oxygen. Respiratory system: Clear to  auscultation. Respiratory effort normal. No added sounds.  Poor air entry at bases. Cardiovascular system: S1 & S2 heard, RRR. No JVD, murmurs, rubs, gallops or clicks.   pedal edema, now treated with Unna boot. Gastrointestinal system: Soft.  Nontender.  Bowel sound present.  No palpable fluid thrill. Central nervous system: Alert and oriented. No focal neurological deficits. Extremities: Symmetric 5 x  5 power. Skin: No rashes, lesions or ulcers Psychiatry: Judgement and insight appear normal. Mood & affect appropriate.     Data Reviewed: I have personally reviewed following labs and imaging studies  CBC: Recent Labs  Lab 11/07/23 1607 11/08/23 0452 11/08/23 1735 11/09/23 0753 11/09/23 1510 11/10/23 0235 11/11/23 0835 11/12/23 0218  WBC 5.9 6.4  --  5.6  --  4.9 6.2 5.2  NEUTROABS 3.5 4.2  --   --   --  3.2 4.2 3.3  HGB 11.3* 10.3*   < > 10.0* 11.1* 9.9* 10.5* 9.0*  HCT 38.9 33.6*   < > 32.7* 36.6 32.9* 35.7* 29.3*  MCV 88.8 85.9  --  85.6  --  87.0 88.6 86.7  PLT 188 157  --  158  --  153 164 119*   < > = values in this interval not displayed.   Basic Metabolic Panel: Recent Labs  Lab 11/08/23 0452 11/09/23 0753 11/10/23 0235 11/11/23 0835 11/12/23 0218  NA 136 139 142 141 139  K 3.7 3.6 3.7 3.6 3.4*  CL 102 101 103 103 101  CO2 27 31 29  32 30  GLUCOSE 106* 100* 94 112* 111*  BUN 6* 5* 7* 9 10  CREATININE 0.61 0.67 0.58 0.56 0.59  CALCIUM  8.0* 7.9* 8.1* 8.2* 7.7*  MG 1.8  --  1.5* 1.9  --   PHOS  --   --  3.6  --   --    GFR: Estimated Creatinine Clearance: 68.3 mL/min (by C-G formula based on SCr of 0.59 mg/dL). Liver Function Tests: Recent Labs  Lab 11/07/23 1607 11/08/23 0452 11/10/23 0235 11/11/23 0835 11/12/23 0218  AST 36 34 32 33 26  ALT 20 19 19 19 17   ALKPHOS 93 89 77 79 71  BILITOT 0.8 0.6 0.7 0.6 0.4  PROT 6.8 6.3* 5.8* 6.8 5.4*  ALBUMIN 2.1* 2.0* 1.8* 2.0* 1.7*   No results for input(s): "LIPASE", "AMYLASE" in the last 168 hours. Recent Labs  Lab 11/09/23 0753  AMMONIA 29   Coagulation Profile: Recent Labs  Lab 11/08/23 0409  INR 1.2   Cardiac Enzymes: No results for input(s): "CKTOTAL", "CKMB", "CKMBINDEX", "TROPONINI" in the last 168 hours. BNP (last 3 results) No results for input(s): "PROBNP" in the last 8760 hours. HbA1C: No results for input(s): "HGBA1C" in the last 72 hours. CBG: No results for input(s): "GLUCAP"  in the last 168 hours. Lipid Profile: No results for input(s): "CHOL", "HDL", "LDLCALC", "TRIG", "CHOLHDL", "LDLDIRECT" in the last 72 hours. Thyroid  Function Tests: No results for input(s): "TSH", "T4TOTAL", "FREET4", "T3FREE", "THYROIDAB" in the last 72 hours. Anemia Panel: No results for input(s): "VITAMINB12", "FOLATE", "FERRITIN", "TIBC", "IRON", "RETICCTPCT" in the last 72 hours. Sepsis Labs: Recent Labs  Lab 11/08/23 1739  PROCALCITON <0.10    Recent Results (from the past 240 hours)  MRSA Next Gen by PCR, Nasal     Status: None   Collection Time: 11/08/23  5:17 PM   Specimen: Nasal Mucosa; Nasal Swab  Result Value Ref Range Status   MRSA by PCR Next Gen NOT DETECTED NOT DETECTED Final  Comment: (NOTE) The GeneXpert MRSA Assay (FDA approved for NASAL specimens only), is one component of a comprehensive MRSA colonization surveillance program. It is not intended to diagnose MRSA infection nor to guide or monitor treatment for MRSA infections. Test performance is not FDA approved in patients less than 44 years old. Performed at [facility], [address] [place], [place] [address]   Culture, body fluid w Gram Stain-bottle     Status: None (Preliminary result)   Collection Time: 11/10/23  8:25 AM   Specimen: Peritoneal Washings  Result Value Ref Range Status   Specimen Description PERITONEAL  Final   Special Requests NONE  Final   Culture   Final    NO GROWTH 2 DAYS Performed at [facility], [address], [place], [place] [address]    Report Status PENDING  Incomplete  Gram stain     Status: None   Collection Time: 11/10/23  8:30 AM   Specimen: Peritoneal Washings  Result Value Ref Range Status   Specimen Description PERITONEAL  Final   Special Requests NONE  Final   Gram Stain   Final    CYTOSPIN SMEAR WBC SEEN NO ORGANISMS SEEN Performed at [facility], [address], [place], [place] [address]    Report Status 11/10/2023 FINAL   Final         Radiology Studies: No results found.       Scheduled Meds:  atorvastatin   10 mg Oral QHS   carvedilol   3.125 mg Oral BID WC   furosemide   40 mg Oral Daily   gabapentin   400 mg Oral Daily   And   gabapentin   800 mg Oral QHS   liver oil-zinc  oxide   Topical TID   midodrine   10 mg Oral TID WC   pantoprazole  (PROTONIX ) IV  40 mg Intravenous Q12H   potassium chloride   20 mEq Oral BID   sodium chloride  flush  3 mL Intravenous Q12H   spironolactone   100 mg Oral Daily   sucralfate   1 g Oral TID WC & HS   traZODone   50 mg Oral QHS   Continuous Infusions:  albumin human 12.5 g (11/12/23 0931)      LOS: 4 days       Vada Garibaldi, MD [facility]

## 2023-11-12 NOTE — Progress Notes (Signed)
 Subjective: No new complaints.  She does report feeling fatigued.  She is urinating well.  Objective: Vital signs in last 24 hours: Temp:  [97.6 F (36.4 C)-98.3 F (36.8 C)] 97.8 F (36.6 C) (04/26 0801) Pulse Rate:  [77-88] 87 (04/26 0500) Resp:  [13-20] 18 (04/26 0801) BP: (86-129)/(42-52) 98/46 (04/26 0500) SpO2:  [92 %-95 %] 92 % (04/26 0500) Weight:  [83.4 kg] 83.4 kg (04/26 0637) Last BM Date : 11/11/23  Intake/Output from previous day: 04/25 0701 - 04/26 0700 In: 343 [P.O.:240; I.V.:3; IV Piggyback:100] Out: -  Intake/Output this shift: No intake/output data recorded.  General appearance: alert and no distress Resp: clear to auscultation bilaterally Cardio: regular rate and rhythm GI: obese, soft Extremities: 2+ edema  Lab Results: Recent Labs    11/10/23 0235 11/11/23 0835 11/12/23 0218  WBC 4.9 6.2 5.2  HGB 9.9* 10.5* 9.0*  HCT 32.9* 35.7* 29.3*  PLT 153 164 119*   BMET Recent Labs    11/10/23 0235 11/11/23 0835 11/12/23 0218  NA 142 141 139  K 3.7 3.6 3.4*  CL 103 103 101  CO2 29 32 30  GLUCOSE 94 112* 111*  BUN 7* 9 10  CREATININE 0.58 0.56 0.59  CALCIUM  8.1* 8.2* 7.7*   LFT Recent Labs    11/12/23 0218  PROT 5.4*  ALBUMIN 1.7*  AST 26  ALT 17  ALKPHOS 71  BILITOT 0.4   PT/INR No results for input(s): "LABPROT", "INR" in the last 72 hours. Hepatitis Panel No results for input(s): "HEPBSAG", "HCVAB", "HEPAIGM", "HEPBIGM" in the last 72 hours. C-Diff No results for input(s): "CDIFFTOX" in the last 72 hours. Fecal Lactopherrin No results for input(s): "FECLLACTOFRN" in the last 72 hours.  Studies/Results: IR Paracentesis Result Date: 11/10/2023 INDICATION: Patient with history of decompensated cirrhosis, ascites. IR consulted for diagnostic and therapeutic paracentesis with 4L max. EXAM: ULTRASOUND GUIDED DIAGNOSTIC AND THERAPEUTIC PARACENTESIS MEDICATIONS: 10 mL 1% lidocaine  COMPLICATIONS: None immediate. PROCEDURE: Informed  written consent was obtained from the patient after a discussion of the risks, benefits and alternatives to treatment. A timeout was performed prior to the initiation of the procedure. Initial ultrasound scanning demonstrates a moderate amount of ascites within the right lower abdominal quadrant. The right lower abdomen was prepped and draped in the usual sterile fashion. 1% lidocaine  was used for local anesthesia. Following this, a 19 gauge, 7-cm, Yueh catheter was introduced. An ultrasound image was saved for documentation purposes. The paracentesis was performed. The catheter was removed and a dressing was applied. The patient tolerated the procedure well without immediate post procedural complication. FINDINGS: A total of approximately 1.85 liters of turbid yellow fluid was removed. Samples were sent to the laboratory as requested by the clinical team. IMPRESSION: Successful ultrasound-guided paracentesis yielding 1.85 liters of peritoneal fluid. Performed by: Wyatt Pommier, PA-C Electronically Signed   By: Myrlene Asper D.O.   On: 11/10/2023 09:04    Medications: Scheduled:  atorvastatin   10 mg Oral QHS   carvedilol   3.125 mg Oral BID WC   furosemide   40 mg Oral Daily   gabapentin   400 mg Oral Daily   And   gabapentin   800 mg Oral QHS   lactulose   30 g Oral BID   liver oil-zinc  oxide   Topical TID   midodrine   10 mg Oral TID WC   pantoprazole  (PROTONIX ) IV  40 mg Intravenous Q12H   sodium chloride  flush  3 mL Intravenous Q12H   spironolactone   100 mg Oral Daily  sucralfate   1 g Oral TID WC & HS   traZODone   50 mg Oral QHS   Continuous:  Assessment/Plan: 1) Decompensated PBC cirrhosis. 2) Hypoalbuminemia. 3) Anemia - 9.0. 4) Anasarca - improving. 5) Hypotension/fatigue.   Clinically she is stable.  She mentions feeling fatigued and it is not clear if the fatigue is from her overall illness/hospitalization versus the relative hypotension.  She is on a lower dose of carvedilol .  Her  renal function is intact.  She may benefit with holding the carvedilol  and assessing her blood pressure and fatigue complaints.  Overall she is diuresing well as her weight dropped from 84 kg down to 83 kg over the past two days.  If the hypotension persists she may benefit with a dose reduction of the diuretics.  There was a drop in her HGB, but no overt evidence of bleeding.  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  Plan: 1) Hold carvedilol . 2) Monitor HGB and transfuse if necessary. 3) Continue with Step I diuretics for now. 4) Continue with midodrine  10 mg TID.  LOS: 4 days   Ciclaly Mulcahey D 11/12/2023, 8:06 AM

## 2023-11-13 DIAGNOSIS — R601 Generalized edema: Secondary | ICD-10-CM | POA: Diagnosis not present

## 2023-11-13 LAB — CBC WITH DIFFERENTIAL/PLATELET
Abs Immature Granulocytes: 0.01 10*3/uL (ref 0.00–0.07)
Basophils Absolute: 0 10*3/uL (ref 0.0–0.1)
Basophils Relative: 1 %
Eosinophils Absolute: 0 10*3/uL (ref 0.0–0.5)
Eosinophils Relative: 0 %
HCT: 33.6 % — ABNORMAL LOW (ref 36.0–46.0)
Hemoglobin: 9.9 g/dL — ABNORMAL LOW (ref 12.0–15.0)
Immature Granulocytes: 0 %
Lymphocytes Relative: 21 %
Lymphs Abs: 1.5 10*3/uL (ref 0.7–4.0)
MCH: 26.3 pg (ref 26.0–34.0)
MCHC: 29.5 g/dL — ABNORMAL LOW (ref 30.0–36.0)
MCV: 89.1 fL (ref 80.0–100.0)
Monocytes Absolute: 0.6 10*3/uL (ref 0.1–1.0)
Monocytes Relative: 9 %
Neutro Abs: 4.9 10*3/uL (ref 1.7–7.7)
Neutrophils Relative %: 69 %
Platelets: 105 10*3/uL — ABNORMAL LOW (ref 150–400)
RBC: 3.77 MIL/uL — ABNORMAL LOW (ref 3.87–5.11)
RDW: 15.9 % — ABNORMAL HIGH (ref 11.5–15.5)
WBC: 7 10*3/uL (ref 4.0–10.5)
nRBC: 0 % (ref 0.0–0.2)

## 2023-11-13 LAB — COMPREHENSIVE METABOLIC PANEL WITH GFR
ALT: 20 U/L (ref 0–44)
AST: 47 U/L — ABNORMAL HIGH (ref 15–41)
Albumin: 2.5 g/dL — ABNORMAL LOW (ref 3.5–5.0)
Alkaline Phosphatase: 74 U/L (ref 38–126)
Anion gap: 11 (ref 5–15)
BUN: 11 mg/dL (ref 8–23)
CO2: 23 mmol/L (ref 22–32)
Calcium: 8.2 mg/dL — ABNORMAL LOW (ref 8.9–10.3)
Chloride: 104 mmol/L (ref 98–111)
Creatinine, Ser: 0.53 mg/dL (ref 0.44–1.00)
GFR, Estimated: >60 mL/min (ref >60)
Glucose, Bld: 98 mg/dL (ref 70–99)
Potassium: 4.6 mmol/L (ref 3.5–5.1)
Sodium: 138 mmol/L (ref 135–145)
Total Bilirubin: 1.2 mg/dL (ref 0.0–1.2)
Total Protein: 6.3 g/dL — ABNORMAL LOW (ref 6.5–8.1)

## 2023-11-13 MED ORDER — SPIRONOLACTONE 25 MG PO TABS
50.0000 mg | ORAL_TABLET | Freq: Every day | ORAL | Status: DC
  Administered 2023-11-14 – 2023-11-16 (×3): 50 mg via ORAL
  Filled 2023-11-13 (×3): qty 2

## 2023-11-13 MED ORDER — FUROSEMIDE 20 MG PO TABS
20.0000 mg | ORAL_TABLET | Freq: Every day | ORAL | Status: DC
  Administered 2023-11-14 – 2023-11-16 (×3): 20 mg via ORAL
  Filled 2023-11-13 (×3): qty 1

## 2023-11-13 NOTE — Progress Notes (Signed)
 Mobility Specialist Progress Note;   11/13/23 0850  Mobility  Activity Transferred to/from [facility];Ambulated with assistance in room  Level of Assistance Contact guard assist, steadying assist  Assistive Device Front wheel walker  Distance Ambulated (ft) 20 ft  Activity Response Tolerated fair  Mobility Referral Yes  Mobility visit 1 Mobility  Mobility Specialist Start Time (ACUTE ONLY) 0850  Mobility Specialist Stop Time (ACUTE ONLY) 0858  Mobility Specialist Time Calculation (min) (ACUTE ONLY) 8 min    Pre-mobility: BP 86/39 (48) @8 :05  Pt agreeable to mobility, on BSC upon arrival. BM successful and pericare performed by pt. On RA upon arrival. Pt still c/o dizziness however was able to ambulate around in room w/ light MinG assist. VSS on RA. Pt returned back to chair with all needs met.    Janit Meline Mobility Specialist Please contact via SecureChat or [employer] [phone]

## 2023-11-13 NOTE — Progress Notes (Signed)
 Subjective: Still fatigued.  Objective: Vital signs in last 24 hours: Temp:  [97.9 F (36.6 C)-98.3 F (36.8 C)] 98.2 F (36.8 C) (04/27 1134) Pulse Rate:  [78-85] 85 (04/27 0756) Resp:  [13-19] 18 (04/27 1134) BP: (90-114)/(44-80) 114/45 (04/27 1134) SpO2:  [90 %-95 %] 93 % (04/27 0756) Weight:  [82.1 kg] 82.1 kg (04/27 0503) Last BM Date : 11/12/23  Intake/Output from previous day: 04/26 0701 - 04/27 0700 In: 481.3 [P.O.:440; IV Piggyback:41.3] Out: -  Intake/Output this shift: Total I/O In: 240 [P.O.:240] Out: -   General appearance: alert and no distress GI: obese soft  Lab Results: Recent Labs    11/11/23 0835 11/12/23 0218 11/13/23 0212  WBC 6.2 5.2 7.0  HGB 10.5* 9.0* 9.9*  HCT 35.7* 29.3* 33.6*  PLT 164 119* 105*   BMET Recent Labs    11/11/23 0835 11/12/23 0218 11/13/23 0212  NA 141 139 138  K 3.6 3.4* 4.6  CL 103 101 104  CO2 32 30 23  GLUCOSE 112* 111* 98  BUN 9 10 11   CREATININE 0.56 0.59 0.53  CALCIUM  8.2* 7.7* 8.2*   LFT Recent Labs    11/13/23 0212  PROT 6.3*  ALBUMIN 2.5*  AST 47*  ALT 20  ALKPHOS 74  BILITOT 1.2   PT/INR No results for input(s): "LABPROT", "INR" in the last 72 hours. Hepatitis Panel No results for input(s): "HEPBSAG", "HCVAB", "HEPAIGM", "HEPBIGM" in the last 72 hours. C-Diff No results for input(s): "CDIFFTOX" in the last 72 hours. Fecal Lactopherrin No results for input(s): "FECLLACTOFRN" in the last 72 hours.  Studies/Results: No results found.  Medications: Scheduled:  atorvastatin   10 mg Oral QHS   carvedilol   3.125 mg Oral BID WC   furosemide   40 mg Oral Daily   gabapentin   400 mg Oral Daily   And   gabapentin   800 mg Oral QHS   liver oil-zinc  oxide   Topical TID   midodrine   10 mg Oral TID WC   pantoprazole   40 mg Oral BID   sodium chloride  flush  3 mL Intravenous Q12H   spironolactone   100 mg Oral Daily   sucralfate   1 g Oral TID WC & HS   traZODone   50 mg Oral QHS    Continuous:  Assessment/Plan: 1) Decompensated PBC cirrhosis. 2) Hypoalbuminemia. 3) Stable anemia. 4) Anasarca. 5) Hypotension.   Her hypotension persists even though her carvedilol  was discontinued.  She is still on midodrine  and her creatinine is stable.  There is a significant amount of fluid up to her thighs and reports urinating frequently.    Plan: 1) Decrease furosemide  and spironolactone  in hopes of increasing her BP. 2) Agree with holding lactulose . 3) Continue with midodrine .  LOS: 5 days   Sierra Logan D 11/13/2023, 2:50 PM

## 2023-11-13 NOTE — Progress Notes (Signed)
 Orthopedic Tech Progress Note Patient Details:  CLELLA MCCOSH [birth year] [id number] BLE unna boots applied due to soiling of the unna boots.Requested by RN  Ortho Devices Type of Ortho Device: Radio broadcast assistant Ortho Device/Splint Location: BLE unna boots applied Ortho Device/Splint Interventions: Ordered, Application   Post Interventions Patient Tolerated: Well Instructions Provided: Adjustment of device, Care of device  Nels Banda 11/13/2023, 2:50 PM

## 2023-11-13 NOTE — Progress Notes (Signed)
 PROGRESS NOTE    Sierra Logan  EAV:[id number] DOB: [birth year] DOA: 11/07/2023 PCP: Adelia Homestead, MD    Brief Narrative:  68 year old with history of hypertension, hyperlipidemia, type 2 diabetes, history of cirrhosis with esophageal varices presented with swelling of legs, feet stomatic and black stool.  Ongoing for about a month.  In the emergency room patient was relatively stable.  Hemoglobin 10.3 with recent hemoglobin of 11.3.  Chest x-ray with moderate severity left basilar atelectasis or infiltrate, moderate-sized left pleural effusion.  FOBT positive.  Patient was admitted with anasarca secondary to liver disease. Remains with low blood pressures.  Difficult to maintain diuretics.  Subjective:  Patient seen and examined.  Less dizzy than yesterday on sitting in the toilet.  Blood pressure has mostly remained more than 90.  Lactulose  was discontinued yesterday, she had 7 bowel movements overnight.    Assessment & Plan:   Fluid overload secondary to decompensated cirrhosis and anasarca: Patient with known history of liver cirrhosis.   Lasix  40 mg daily, spironolactone  100 mg daily, carvedilol  3.125 mg daily with holding parameters. Midodrine  10 mg 3 times daily to make room for diuresis. Holding lactulose  today due to too many bowel movements. Echocardiogram with ejection fraction 60 to 65%.  No regional wall motion abnormality.  Large pleural effusion on the left side. Paracentesis 1.8 L transudate removed.  Negative for SBP. Antibiotics discontinued. Unna boot for the legs.  Acute blood loss anemia, history of chronic microcytic anemia due to upper GI bleeding from cirrhosis.  Hemoglobin dropped to 05-27-09.5-9.  Currently no indication for transfusion.  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  Gastric antral vascular ectasia with bleeding was present.  Cauterized.   Is on IV Protonix , Carafate .  Octreotide   discontinued.  Will continue to monitor.  Abnormal chest x-ray: Findings likely due to underinflation and pleural effusion secondary to liver disease.  Type 2 diabetes, known A1c 4.8.  Not on any treatment   Hyperlipidemia: On a statin.  LFTs are normal.  Hypomagnesemia: Replaced.  Adequate.  Continue to work with PT OT.  If adequate improvement, anticipate home with home health PT OT.   DVT prophylaxis: SCDs Start: 11/08/23 0853 SCDs Start: 11/08/23 0427   Code Status: Full code Family Communication: None at the bedside Disposition Plan: Status is: Inpatient Remains inpatient appropriate because: Monitoring for orthostatic changes.     Consultants:  Gastroenterology  Procedures:  EGD,   Antimicrobials:  Rocephin  4/22--4/25     Objective: Vitals:   11/12/23 2329 11/13/23 0316 11/13/23 0503 11/13/23 0756  BP: (!) 106/49 (!) 90/54  (!) 90/44  Pulse: 85 78  85  Resp: 13 18 14    Temp: 98.2 F (36.8 C) 98 F (36.7 C)  97.9 F (36.6 C)  TempSrc:    Oral  SpO2: 90% 92%  93%  Weight:   82.1 kg   Height:        Intake/Output Summary (Last 24 hours) at 11/13/2023 1034 Last data filed at 11/12/2023 1603 Gross per 24 hour  Intake 241.34 ml  Output --  Net 241.34 ml   Filed Weights   11/10/23 0624 11/12/23 0637 11/13/23 0503  Weight: 84.1 kg 83.4 kg 82.1 kg    Examination:  General exam: Fairly comfortable at rest.  On room air.  Using bedside commode. Respiratory system: Clear to auscultation. Respiratory effort normal. Cardiovascular system: S1 & S2 heard, RRR. No JVD, murmurs, rubs,  gallops or clicks.   pedal edema, now treated with Unna boot. Gastrointestinal system: Soft.  Nontender.  Bowel sound present.  No palpable fluid thrill. Central nervous system: Alert and oriented. No focal neurological deficits. Extremities: Symmetric 5 x 5 power. Skin: No rashes, lesions or ulcers Psychiatry: Judgement and insight appear normal. Mood & affect appropriate.      Data Reviewed: I have personally reviewed following labs and imaging studies  CBC: Recent Labs  Lab 11/08/23 0452 11/08/23 1735 11/09/23 0753 11/09/23 1510 11/10/23 0235 11/11/23 0835 11/12/23 0218 11/13/23 0212  WBC 6.4  --  5.6  --  4.9 6.2 5.2 7.0  NEUTROABS 4.2  --   --   --  3.2 4.2 3.3 4.9  HGB 10.3*   < > 10.0* 11.1* 9.9* 10.5* 9.0* 9.9*  HCT 33.6*   < > 32.7* 36.6 32.9* 35.7* 29.3* 33.6*  MCV 85.9  --  85.6  --  87.0 88.6 86.7 89.1  PLT 157  --  158  --  153 164 119* 105*   < > = values in this interval not displayed.   Basic Metabolic Panel: Recent Labs  Lab 11/08/23 0452 11/09/23 0753 11/10/23 0235 11/11/23 0835 11/12/23 0218 11/13/23 0212  NA 136 139 142 141 139 138  K 3.7 3.6 3.7 3.6 3.4* 4.6  CL 102 101 103 103 101 104  CO2 27 31 29  32 30 23  GLUCOSE 106* 100* 94 112* 111* 98  BUN 6* 5* 7* 9 10 11   CREATININE 0.61 0.67 0.58 0.56 0.59 0.53  CALCIUM  8.0* 7.9* 8.1* 8.2* 7.7* 8.2*  MG 1.8  --  1.5* 1.9  --   --   PHOS  --   --  3.6  --   --   --    GFR: Estimated Creatinine Clearance: 67.8 mL/min (by C-G formula based on SCr of 0.53 mg/dL). Liver Function Tests: Recent Labs  Lab 11/08/23 0452 11/10/23 0235 11/11/23 0835 11/12/23 0218 11/13/23 0212  AST 34 32 33 26 47*  ALT 19 19 19 17 20   ALKPHOS 89 77 79 71 74  BILITOT 0.6 0.7 0.6 0.4 1.2  PROT 6.3* 5.8* 6.8 5.4* 6.3*  ALBUMIN 2.0* 1.8* 2.0* 1.7* 2.5*   No results for input(s): "LIPASE", "AMYLASE" in the last 168 hours. Recent Labs  Lab 11/09/23 0753  AMMONIA 29   Coagulation Profile: Recent Labs  Lab 11/08/23 0409  INR 1.2   Cardiac Enzymes: No results for input(s): "CKTOTAL", "CKMB", "CKMBINDEX", "TROPONINI" in the last 168 hours. BNP (last 3 results) No results for input(s): "PROBNP" in the last 8760 hours. HbA1C: No results for input(s): "HGBA1C" in the last 72 hours. CBG: No results for input(s): "GLUCAP" in the last 168 hours. Lipid Profile: No results for  input(s): "CHOL", "HDL", "LDLCALC", "TRIG", "CHOLHDL", "LDLDIRECT" in the last 72 hours. Thyroid  Function Tests: No results for input(s): "TSH", "T4TOTAL", "FREET4", "T3FREE", "THYROIDAB" in the last 72 hours. Anemia Panel: No results for input(s): "VITAMINB12", "FOLATE", "FERRITIN", "TIBC", "IRON", "RETICCTPCT" in the last 72 hours. Sepsis Labs: Recent Labs  Lab 11/08/23 1739  PROCALCITON <0.10    Recent Results (from the past 240 hours)  MRSA Next Gen by PCR, Nasal     Status: None   Collection Time: 11/08/23  5:17 PM   Specimen: Nasal Mucosa; Nasal Swab  Result Value Ref Range Status   MRSA by PCR Next Gen NOT DETECTED NOT DETECTED Final    Comment: (NOTE) The GeneXpert MRSA Assay (  FDA approved for NASAL specimens only), is one component of a comprehensive MRSA colonization surveillance program. It is not intended to diagnose MRSA infection nor to guide or monitor treatment for MRSA infections. Test performance is not FDA approved in patients less than 54 years old. Performed at [facility], [address], [place], [place] [address]   Culture, body fluid w Gram Stain-bottle     Status: None (Preliminary result)   Collection Time: 11/10/23  8:25 AM   Specimen: Peritoneal Washings  Result Value Ref Range Status   Specimen Description PERITONEAL  Final   Special Requests NONE  Final   Culture   Final    NO GROWTH 3 DAYS Performed at [facility], [address], [place], [place] [address]    Report Status PENDING  Incomplete  Gram stain     Status: None   Collection Time: 11/10/23  8:30 AM   Specimen: Peritoneal Washings  Result Value Ref Range Status   Specimen Description PERITONEAL  Final   Special Requests NONE  Final   Gram Stain   Final    CYTOSPIN SMEAR WBC SEEN NO ORGANISMS SEEN Performed at [facility], [address], [place], [place] [address]    Report Status 11/10/2023 FINAL  Final         Radiology Studies: No results  found.       Scheduled Meds:  atorvastatin   10 mg Oral QHS   carvedilol   3.125 mg Oral BID WC   furosemide   40 mg Oral Daily   gabapentin   400 mg Oral Daily   And   gabapentin   800 mg Oral QHS   liver oil-zinc  oxide   Topical TID   midodrine   10 mg Oral TID WC   pantoprazole   40 mg Oral BID   sodium chloride  flush  3 mL Intravenous Q12H   spironolactone   100 mg Oral Daily   sucralfate   1 g Oral TID WC & HS   traZODone   50 mg Oral QHS   Continuous Infusions:      LOS: 5 days       Vada Garibaldi, MD [facility]

## 2023-11-13 NOTE — Plan of Care (Signed)

## 2023-11-14 DIAGNOSIS — K31811 Angiodysplasia of stomach and duodenum with bleeding: Secondary | ICD-10-CM | POA: Diagnosis not present

## 2023-11-14 DIAGNOSIS — R601 Generalized edema: Secondary | ICD-10-CM | POA: Diagnosis not present

## 2023-11-14 DIAGNOSIS — K743 Primary biliary cirrhosis: Secondary | ICD-10-CM

## 2023-11-14 DIAGNOSIS — K7290 Hepatic failure, unspecified without coma: Secondary | ICD-10-CM | POA: Diagnosis not present

## 2023-11-14 LAB — CYTOLOGY - NON PAP

## 2023-11-14 NOTE — Progress Notes (Signed)
 PROGRESS NOTE    Sierra Logan  ZOX:[id number] DOB: [birth year] DOA: 11/07/2023 PCP: Adelia Homestead, MD    Brief Narrative:  68 year old with history of hypertension, hyperlipidemia, type 2 diabetes, history of cirrhosis with esophageal varices presented with swelling of legs, feet stomatic and black stool.  Ongoing for about a month.  In the emergency room patient was relatively stable.  Hemoglobin 10.3 with recent hemoglobin of 11.3.  Chest x-ray with moderate severity left basilar atelectasis or infiltrate, moderate-sized left pleural effusion.  FOBT positive.  Patient was admitted with anasarca secondary to liver disease. Remains with low blood pressures.  Difficult to maintain diuretics.  Subjective:  Patient seen and examined.  After discontinuing carvedilol  and stopping lactulose  symptoms has slightly improved.  Still feels weak but denies any dizziness lightheadedness or syncopal episodes.  Negative for orthostatics since yesterday. Still having frequent bowel movements, small quantity for 4-5 last night. She is not sure she wants to go to SNF but she is open to the idea if she needs it.  She wants to work with therapies today.  Stool is clear, no melanotic bowel movements now.  Denies any nausea or vomiting.  Appetite is poor.    Assessment & Plan:   Fluid overload secondary to decompensated cirrhosis and anasarca: Patient with known history of liver cirrhosis.  Still has significant leg swelling. Patient on Lasix , spironolactone , carvedilol  was started but discontinued due to ongoing orthostatic symptoms despite being on midodrine  10 mg 3 times daily. On lactulose  at home.  Holding today as patient has more than 5 bowel movements in 24 hours. Echocardiogram with ejection fraction 60 to 65%.  No regional wall motion abnormality.  Large pleural effusion on the left side. Paracentesis 1.8 L transudate removed.  Negative for SBP. Antibiotics discontinued. Unna boot for the  legs.  Acute blood loss anemia, history of chronic microcytic anemia due to upper GI bleeding from cirrhosis.  Hemoglobin dropped to 05-27-09.5-9.  Currently no indication for transfusion.  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  Gastric antral vascular ectasia with bleeding was present.  Cauterized.   Is on IV Protonix , Carafate .  Octreotide  discontinued.  Will continue to monitor.  Abnormal chest x-ray: Findings likely due to underinflation and pleural effusion secondary to liver disease.  Type 2 diabetes, known A1c 4.8.  Not on any treatment   Hyperlipidemia: On a statin.  LFTs are normal.  Hypomagnesemia: Replaced.  Adequate.  Continue to work with PT OT.  If adequate improvement, anticipate home with home health PT OT.   DVT prophylaxis: SCDs Start: 11/08/23 0853 SCDs Start: 11/08/23 0427   Code Status: Full code Family Communication: None at the bedside Disposition Plan: Status is: Inpatient Remains inpatient appropriate because: Monitoring for orthostatic changes, medication adjustment.     Consultants:  Gastroenterology  Procedures:  EGD,   Antimicrobials:  Rocephin  4/22--4/25     Objective: Vitals:   11/13/23 2344 11/14/23 0504 11/14/23 0528 11/14/23 0852  BP: (!) 92/47 (!) 103/41  (!) 107/40  Pulse: 83 89  95  Resp: 16 20  20   Temp: 98.2 F (36.8 C) 98.3 F (36.8 C)  98 F (36.7 C)  TempSrc: Oral Oral  Oral  SpO2: 92% 91%  92%  Weight:   81.2 kg   Height:        Intake/Output Summary (Last 24 hours) at 11/14/2023 0939 Last data filed at 11/14/2023 0854 Gross per 24  hour  Intake 360 ml  Output --  Net 360 ml   Filed Weights   11/12/23 0637 11/13/23 0503 11/14/23 0528  Weight: 83.4 kg 82.1 kg 81.2 kg    Examination:  General exam: Looks fairly comfortable.  Frail.  On room air today. Respiratory system: Clear to auscultation. Respiratory effort normal. Cardiovascular system: S1 & S2  heard, RRR. No JVD, murmurs, rubs, gallops or clicks.  3+ dorsal and pedal edema, now treated with Unna boot. Gastrointestinal system: Soft.  Nontender.  Bowel sound present.  No palpable fluid thrill. Central nervous system: Alert and oriented. No focal neurological deficits. Extremities: Symmetric 5 x 5 power. Skin: No rashes, lesions or ulcers Psychiatry: Judgement and insight appear normal. Mood & affect appropriate.     Data Reviewed: I have personally reviewed following labs and imaging studies  CBC: Recent Labs  Lab 11/08/23 0452 11/08/23 1735 11/09/23 0753 11/09/23 1510 11/10/23 0235 11/11/23 0835 11/12/23 0218 11/13/23 0212  WBC 6.4  --  5.6  --  4.9 6.2 5.2 7.0  NEUTROABS 4.2  --   --   --  3.2 4.2 3.3 4.9  HGB 10.3*   < > 10.0* 11.1* 9.9* 10.5* 9.0* 9.9*  HCT 33.6*   < > 32.7* 36.6 32.9* 35.7* 29.3* 33.6*  MCV 85.9  --  85.6  --  87.0 88.6 86.7 89.1  PLT 157  --  158  --  153 164 119* 105*   < > = values in this interval not displayed.   Basic Metabolic Panel: Recent Labs  Lab 11/08/23 0452 11/09/23 0753 11/10/23 0235 11/11/23 0835 11/12/23 0218 11/13/23 0212  NA 136 139 142 141 139 138  K 3.7 3.6 3.7 3.6 3.4* 4.6  CL 102 101 103 103 101 104  CO2 27 31 29  32 30 23  GLUCOSE 106* 100* 94 112* 111* 98  BUN 6* 5* 7* 9 10 11   CREATININE 0.61 0.67 0.58 0.56 0.59 0.53  CALCIUM  8.0* 7.9* 8.1* 8.2* 7.7* 8.2*  MG 1.8  --  1.5* 1.9  --   --   PHOS  --   --  3.6  --   --   --    GFR: Estimated Creatinine Clearance: 67.3 mL/min (by C-G formula based on SCr of 0.53 mg/dL). Liver Function Tests: Recent Labs  Lab 11/08/23 0452 11/10/23 0235 11/11/23 0835 11/12/23 0218 11/13/23 0212  AST 34 32 33 26 47*  ALT 19 19 19 17 20   ALKPHOS 89 77 79 71 74  BILITOT 0.6 0.7 0.6 0.4 1.2  PROT 6.3* 5.8* 6.8 5.4* 6.3*  ALBUMIN 2.0* 1.8* 2.0* 1.7* 2.5*   No results for input(s): "LIPASE", "AMYLASE" in the last 168 hours. Recent Labs  Lab 11/09/23 0753  AMMONIA 29    Coagulation Profile: Recent Labs  Lab 11/08/23 0409  INR 1.2   Cardiac Enzymes: No results for input(s): "CKTOTAL", "CKMB", "CKMBINDEX", "TROPONINI" in the last 168 hours. BNP (last 3 results) No results for input(s): "PROBNP" in the last 8760 hours. HbA1C: No results for input(s): "HGBA1C" in the last 72 hours. CBG: No results for input(s): "GLUCAP" in the last 168 hours. Lipid Profile: No results for input(s): "CHOL", "HDL", "LDLCALC", "TRIG", "CHOLHDL", "LDLDIRECT" in the last 72 hours. Thyroid  Function Tests: No results for input(s): "TSH", "T4TOTAL", "FREET4", "T3FREE", "THYROIDAB" in the last 72 hours. Anemia Panel: No results for input(s): "VITAMINB12", "FOLATE", "FERRITIN", "TIBC", "IRON", "RETICCTPCT" in the last 72 hours. Sepsis Labs: Recent Labs  Lab  11/08/23 1739  PROCALCITON <0.10    Recent Results (from the past 240 hours)  MRSA Next Gen by PCR, Nasal     Status: None   Collection Time: 11/08/23  5:17 PM   Specimen: Nasal Mucosa; Nasal Swab  Result Value Ref Range Status   MRSA by PCR Next Gen NOT DETECTED NOT DETECTED Final    Comment: (NOTE) The GeneXpert MRSA Assay (FDA approved for NASAL specimens only), is one component of a comprehensive MRSA colonization surveillance program. It is not intended to diagnose MRSA infection nor to guide or monitor treatment for MRSA infections. Test performance is not FDA approved in patients less than 36 years old. Performed at [facility], [address] [place], [place] [address]   Culture, body fluid w Gram Stain-bottle     Status: None (Preliminary result)   Collection Time: 11/10/23  8:25 AM   Specimen: Peritoneal Washings  Result Value Ref Range Status   Specimen Description PERITONEAL  Final   Special Requests NONE  Final   Culture   Final    NO GROWTH 4 DAYS Performed at [facility], [address], [place], [place] [address]    Report Status PENDING  Incomplete  Gram stain     Status:  None   Collection Time: 11/10/23  8:30 AM   Specimen: Peritoneal Washings  Result Value Ref Range Status   Specimen Description PERITONEAL  Final   Special Requests NONE  Final   Gram Stain   Final    CYTOSPIN SMEAR WBC SEEN NO ORGANISMS SEEN Performed at [facility], [address] [place], [place] [address]    Report Status 11/10/2023 FINAL  Final         Radiology Studies: No results found.       Scheduled Meds:  atorvastatin   10 mg Oral QHS   furosemide   20 mg Oral Daily   gabapentin   400 mg Oral Daily   And   gabapentin   800 mg Oral QHS   liver oil-zinc  oxide   Topical TID   midodrine   10 mg Oral TID WC   pantoprazole   40 mg Oral BID   sodium chloride  flush  3 mL Intravenous Q12H   spironolactone   50 mg Oral Daily   sucralfate   1 g Oral TID WC & HS   traZODone   50 mg Oral QHS   Continuous Infusions:      LOS: 6 days       Vada Garibaldi, MD [facility]

## 2023-11-14 NOTE — TOC Progression Note (Signed)
 Transition of Care [facility]) - Progression Note    Patient Details  Name: Sierra Logan MRN: [id number] Date of Birth: [birth year]  Transition of Care [facility]) CM/SW Contact  Juliane Och, LCSW Phone Number: 11/14/2023, 2:07 PM  Clinical Narrative:     2:07 PM CSW provided patient with current SNF options ([address], [address], [facility], [address], [address], [address], [place], [place], [facility]) and [facility].  Expected Discharge Plan: Skilled Nursing Facility Barriers to Discharge: Continued Medical Work up, English as a second language teacher, SNF Pending bed offer  Expected Discharge Plan and Services In-house Referral: Clinical Social Work   Post Acute Care Choice: Skilled Nursing Facility Living arrangements for the past 2 months: Single Family Home                                       Social Determinants of Health (SDOH) Interventions SDOH Screenings   Food Insecurity: No Food Insecurity (11/08/2023)  Housing: Low Risk  (11/08/2023)  Transportation Needs: No Transportation Needs (11/08/2023)  Utilities: Not At Risk (11/08/2023)  Depression (PHQ2-9): Low Risk  (10/27/2023)  Social Connections: Moderately Isolated (11/08/2023)  Tobacco Use: High Risk (11/09/2023)    Readmission Risk Interventions     No data to display

## 2023-11-14 NOTE — Progress Notes (Addendum)
 Physical Therapy Treatment Patient Details Name: Sierra Logan MRN: [id number] DOB: Oct 05, 1955 Today's Date: 11/14/2023   History of Present Illness 68 year old presented 11/07/23 with swelling of legs, feet, stomach and black stool. Chest x-ray with moderate severity left basilar atelectasis or infiltrate, moderate-sized left pleural effusion. +anasarca; +GI bleed; 4/23 EGD with esophageal varices; 4/24 paracentesis; persistent hypotension  PMH  hypertension, hyperlipidemia, type 2 diabetes, history of cirrhosis with esophageal varices    PT Comments  Patient continues with low BP and dizziness with standing/walking (although not officially orthostatic). She was able to progress to ambulating x 30 ft x2 reps (seated rest between to check BP). Participated in LE exercises and reports she spent most of the day up in chair yesterday. Discussed discharge plan and pt agreed she is at risk for falls (2 PTA) and agreed short-term SNF would be safest option. Social Music therapist updated.    Seated BP  84/44 (57)  HR 89 Seated after walking  98/39 (57) HR 116 Seated after walking 102/71 (83) HR 115      If plan is discharge home, recommend the following: A little help with walking and/or transfers;A little help with bathing/dressing/bathroom;Assistance with cooking/housework;Assist for transportation;Help with stairs or ramp for entrance   Can travel by private vehicle     No  Equipment Recommendations  Other (comment) (TBD; pt reports house too small for use of RW)    Recommendations for Other Services       Precautions / Restrictions Precautions Precautions: Fall Recall of Precautions/Restrictions: Intact Restrictions Weight Bearing Restrictions Per Provider Order: No     Mobility  Bed Mobility               General bed mobility comments: pt up on BSC on arrival    Transfers Overall transfer level: Needs assistance Equipment used: Rolling walker (2 wheels) Transfers: Sit  to/from Stand, Bed to chair/wheelchair/BSC Sit to Stand: Contact guard assist   Step pivot transfers: Contact guard assist       General transfer comment: no physical assist needed. CGA for safety due to hypotension with dizziness    Ambulation/Gait Ambulation/Gait assistance: Contact guard assist Gait Distance (Feet): 30 Feet (seated rest, 30) Assistive device: Rolling walker (2 wheels) Gait Pattern/deviations: Step-through pattern, Decreased stride length, Shuffle   Gait velocity interpretation: <1.8 ft/sec, indicate of risk for recurrent falls   General Gait Details: +dizziness with standing and drop in BP although not technically orthostatic; stayed in room to be close to sitting surface in case of sudden need   Stairs             Wheelchair Mobility     Tilt Bed    Modified Rankin (Stroke Patients Only)       Balance Overall balance assessment: Needs assistance Sitting-balance support: No upper extremity supported, Feet supported Sitting balance-Leahy Scale: Good     Standing balance support: No upper extremity supported Standing balance-Leahy Scale: Fair                 High Level Balance Comments: feet apart eyes closed x 10 sec            Communication Communication Communication: Impaired Factors Affecting Communication: Hearing impaired  Cognition Arousal: Alert Behavior During Therapy: WFL for tasks assessed/performed   PT - Cognitive impairments: No apparent impairments                         Following commands:  Intact      Cueing Cueing Techniques: Verbal cues  Exercises General Exercises - Lower Extremity Long Arc Quad: AROM, Both, 10 reps, Seated Other Exercises Other Exercises: sit to stand x 10 reps with emphasis on slow, controlled descent with hands on knees    General Comments        Pertinent Vitals/Pain Pain Assessment Pain Assessment: Faces Faces Pain Scale: Hurts little more Pain Location: bil  lower legs Pain Descriptors / Indicators: Burning Pain Intervention(s): Limited activity within patient's tolerance, Monitored during session    Home Living                          Prior Function            PT Goals (current goals can now be found in the care plan section) Acute Rehab PT Goals Patient Stated Goal: get better Time For Goal Achievement: 11/24/23 Potential to Achieve Goals: Good Progress towards PT goals: Progressing toward goals    Frequency    Min 2X/week      PT Plan      Co-evaluation              AM-PAC PT "6 Clicks" Mobility   Outcome Measure  Help needed turning from your back to your side while in a flat bed without using bedrails?: None Help needed moving from lying on your back to sitting on the side of a flat bed without using bedrails?: A Little Help needed moving to and from a bed to a chair (including a wheelchair)?: A Little Help needed standing up from a chair using your arms (e.g., wheelchair or bedside chair)?: A Little Help needed to walk in hospital room?: A Little Help needed climbing 3-5 steps with a railing? : Total 6 Click Score: 17    End of Session Equipment Utilized During Treatment: Gait belt;Oxygen Activity Tolerance: Treatment limited secondary to medical complications (Comment) (limited by hypotension/dizziness) Patient left: in chair;with call bell/phone within reach Nurse Communication: Other (comment) (drop in BP; recommend SNF) PT Visit Diagnosis: Other abnormalities of gait and mobility (R26.89);Repeated falls (R29.6);Muscle weakness (generalized) (M62.81);Dizziness and giddiness (R42)     Time: 1610-9604 PT Time Calculation (min) (ACUTE ONLY): 25 min  Charges:    $Gait Training: 8-22 mins $Therapeutic Exercise: 8-22 mins PT General Charges $$ ACUTE PT VISIT: 1 Visit                      Gayle Kava, PT Acute Rehabilitation Services  Office [phone]    Guilford Leep 11/14/2023, 11:07  AM

## 2023-11-14 NOTE — Plan of Care (Signed)

## 2023-11-14 NOTE — Plan of Care (Signed)
  Problem: Education: Goal: Knowledge of General Education information will improve Description: Including pain rating scale, medication(s)/side effects and non-pharmacologic comfort measures Outcome: Progressing   Problem: Health Behavior/Discharge Planning: Goal: Ability to manage health-related needs will improve Outcome: Progressing   Problem: Clinical Measurements: Goal: Ability to maintain clinical measurements within normal limits will improve Outcome: Progressing Goal: Diagnostic test results will improve Outcome: Progressing Goal: Respiratory complications will improve Outcome: Progressing Goal: Cardiovascular complication will be avoided Outcome: Progressing   Problem: Activity: Goal: Risk for activity intolerance will decrease Outcome: Progressing   Problem: Nutrition: Goal: Adequate nutrition will be maintained Outcome: Progressing   Problem: Coping: Goal: Level of anxiety will decrease Outcome: Progressing   Problem: Pain Managment: Goal: General experience of comfort will improve and/or be controlled Outcome: Progressing   Problem: Safety: Goal: Ability to remain free from injury will improve Outcome: Progressing   Problem: Skin Integrity: Goal: Risk for impaired skin integrity will decrease Outcome: Progressing

## 2023-11-14 NOTE — Progress Notes (Signed)
 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  Decompensated PBC cirrhosis -- Ascites/LE edema --negative for SBP; diuretics decreased yesterday due to fatigue.  Creatinine stable and in fact slightly improved.  May need to go back up on diuretics as some of the fatigue may have been secondary to beta-blocker; low-sodium diet  --Encephalopathy --lactulose  on hold due to greater than 5 BMs yesterday; resume when needed to maintain 2-3 soft formed stools daily  --Small varices --did not well tolerate carvedilol  due to significant fatigue; will leave off carvedilol  for now but consider adding back when patient improves  --She is not on ursodiol , it appears to have been stopped or expired; typically this continues uninterrupted; her alkaline phosphatase is normal so certainly not urgent to restart but should be addressed as an outpatient.  2.  Hypotension --pressure soft without significant hemodynamic compromise tolerating midodrine  -- Continue midodrine  10 mg 3 times daily  3. GAVE --status post APC ablation; will need repeat EGD in 1 to 3 months; continue PPI and monitor hemoglobin   Chief Complaint   Patient sitting on side of the bed getting ready to work with physical therapy Reports abdominal discomfort which is  chronic and not worsening Denies chest pain Tolerating diet  Vital signs in last 24 hours: Temp:  [97.7 F (36.5 C)-98.3 F (36.8 C)] 98 F (36.7 C) (04/28 0852) Pulse Rate:  [75-95] 87 (04/28 1017) Resp:  [15-20] 15 (04/28 1017) BP: (92-116)/(40-60) 98/51 (04/28 1017) SpO2:  [91 %-96 %] 95 % (04/28 1017) Weight:  [81.2 kg] 81.2 kg (04/28 0528) Last BM Date : 11/13/23 Gen: awake, alert, NAD, sitting on side of the bed HEENT: anicteric  CV: RRR, no mrg Pulm: Decreased bilateral bases right greater than left Abd: soft, obese, mildly distended, nontender, +BS throughout Ext: no c/c, 2-3+ LE edema Neuro: nonfocal, no asterixis  Intake/Output from previous day: 04/27 0701 - 04/28 0700 In: 240 [P.O.:240] Out: -  Intake/Output this shift: Total I/O In: 240 [P.O.:240] Out: -   Lab Results: Recent Labs    11/12/23 0218 11/13/23 0212  WBC 5.2 7.0  HGB 9.0* 9.9*  HCT 29.3* 33.6*  PLT 119* 105*   BMET Recent Labs    11/12/23 0218 11/13/23 0212  NA 139 138  K 3.4* 4.6  CL 101 104  CO2 30 23  GLUCOSE 111* 98  BUN 10 11  CREATININE 0.59 0.53  CALCIUM  7.7* 8.2*   LFT Recent Labs    11/13/23 0212  PROT 6.3*  ALBUMIN 2.5*  AST 47*  ALT 20  ALKPHOS 74  BILITOT 1.2   PT/INR No results for input(s): "LABPROT", "INR" in the last 72 hours. Hepatitis Panel No results for input(s): "HEPBSAG", "HCVAB", "HEPAIGM", "HEPBIGM" in the last 72 hours.  Studies/Results: No results found.    LOS:  6 days   Nannette Babe, MD 11/14/2023, 10:59 AM See [facility], to contact our on call provider

## 2023-11-15 DIAGNOSIS — R601 Generalized edema: Secondary | ICD-10-CM | POA: Diagnosis not present

## 2023-11-15 LAB — BASIC METABOLIC PANEL WITH GFR
Anion gap: 8 (ref 5–15)
BUN: 9 mg/dL (ref 8–23)
CO2: 25 mmol/L (ref 22–32)
Calcium: 8 mg/dL — ABNORMAL LOW (ref 8.9–10.3)
Chloride: 102 mmol/L (ref 98–111)
Creatinine, Ser: 0.71 mg/dL (ref 0.44–1.00)
GFR, Estimated: >60 mL/min (ref >60)
Glucose, Bld: 84 mg/dL (ref 70–99)
Potassium: 4.2 mmol/L (ref 3.5–5.1)
Sodium: 135 mmol/L (ref 135–145)

## 2023-11-15 LAB — CULTURE, BODY FLUID W GRAM STAIN -BOTTLE: Culture: NO GROWTH

## 2023-11-15 MED ORDER — MIDODRINE HCL 5 MG PO TABS
15.0000 mg | ORAL_TABLET | Freq: Three times a day (TID) | ORAL | Status: DC
  Administered 2023-11-15 – 2023-11-20 (×17): 15 mg via ORAL
  Filled 2023-11-15 (×18): qty 3

## 2023-11-15 NOTE — TOC Progression Note (Addendum)
 Transition of Care [facility]) - Progression Note    Patient Details  Name: Sierra Logan MRN: [id number] Date of Birth: 04/04/1956  [facility] CM/SW Contact  Juliane Och, LCSW Phone Number: 11/15/2023, 4:37 PM  Clinical Narrative:     4:37 PM CSW returned to patient's bedside to follow up on SNF decision. Patient accepted bed offer at [facility] and consented CSW to compete insurance authorization for SNF/ambulance transportation. CSW informed SNF who confirmed bed availability for discharge tomorrow if insurance authorization is approved. CSW attempted to initiate insurance authorization for SNF/ambulance transportation, but there was no response and a voicemail was left.  4:49 PM SNF and ambulance transportation insurance authorizations are currently pending.  Expected Discharge Plan: Skilled Nursing Facility Barriers to Discharge: Continued Medical Work up, English as a second language teacher, SNF Pending bed offer  Expected Discharge Plan and Services In-house Referral: Clinical Social Work   Post Acute Care Choice: Skilled Nursing Facility Living arrangements for the past 2 months: Single Family Home                                       Social Determinants of Health (SDOH) Interventions SDOH Screenings   Food Insecurity: No Food Insecurity (11/08/2023)  Housing: Low Risk  (11/08/2023)  Transportation Needs: No Transportation Needs (11/08/2023)  Utilities: Not At Risk (11/08/2023)  Depression (PHQ2-9): Low Risk  (10/27/2023)  Social Connections: Moderately Isolated (11/08/2023)  Tobacco Use: High Risk (11/09/2023)    Readmission Risk Interventions     No data to display

## 2023-11-15 NOTE — Progress Notes (Signed)
 PROGRESS NOTE    Sierra Logan  ZHY:[id number] DOB: 1955/08/29 DOA: 11/07/2023 PCP: Adelia Homestead, MD    Brief Narrative:  68 year old with history of hypertension, hyperlipidemia, type 2 diabetes, history of cirrhosis with esophageal varices presented with swelling of legs and feet, melanotic black stool.  Ongoing for about a month.  In the emergency room patient was relatively stable.  Hemoglobin 10.3 with recent hemoglobin of 11.3.  Chest x-ray with moderate severity left basilar atelectasis or infiltrate, moderate-sized left pleural effusion.  FOBT positive.  Patient was admitted with anasarca secondary to liver disease. Followed by gastroenterology.  Difficult to tolerate diuresis with low blood pressures.  Medically stabilizing.  Will need SNF.   Subjective:  Patient seen and examined.  She was washing herself up.  Checked standing blood pressure and it was 109.  Patient always have some kind of weakness and dizziness but this was without evidence of orthostatic symptoms.  Denied any lightheadedness.  Appetite is fair.  Denies any abdominal pain. Patient still has frequent small volume mushy bowel movements but they are better than before. She is agreeable to go to a skilled nursing facility. Will increase midodrine  to 15 mg 3 times daily. Will discharge to a skilled nursing facility when agreed by GI.    Assessment & Plan:   Fluid overload secondary to decompensated cirrhosis and anasarca: Patient with known history of liver cirrhosis.  Still has significant leg swelling. Patient on Lasix , spironolactone , carvedilol  was started but discontinued due to ongoing orthostatic symptoms despite being on midodrine  10 mg 3 times daily. 4/29, Lasix  20 mg daily, Aldactone  50 mg daily.  Midodrine  15 mg 3 times daily. Hopefully we can keep her on this as scheduled. Echocardiogram with ejection fraction 60 to 65%.  No regional wall motion abnormality.  Large pleural effusion on the  left side. Paracentesis 1.8 L transudate removed.  Negative for SBP. Antibiotics discontinued. Unna boot for the legs.  Acute blood loss anemia, history of chronic microcytic anemia due to upper GI bleeding from cirrhosis.  Hemoglobin dropped to 05-27-09.5-9.  Currently no indication for transfusion.  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  Gastric antral vascular ectasia with bleeding was present.  Cauterized.   Oral Protonix , Carafate . No evidence of ongoing bleeding.  Will need follow-up.  Abnormal chest x-ray: Findings likely due to underinflation and pleural effusion secondary to liver disease.  Type 2 diabetes, known A1c 4.8.  Not on any treatment   Hyperlipidemia: On a statin.  LFTs are normal.  Hypomagnesemia: Replaced.  Adequate.  Continue to work with PT OT.  If no further orthostatic changes, anticipate going to a skilled nursing facility today or tomorrow.   DVT prophylaxis: SCDs Start: 11/08/23 0853 SCDs Start: 11/08/23 0427   Code Status: Full code Family Communication: None at the bedside Disposition Plan: Status is: Inpatient Remains inpatient appropriate because: Monitoring for orthostatic changes, medication adjustment. Needs a skilled nursing facility.   Consultants:  Gastroenterology  Procedures:  EGD,   Antimicrobials:  Rocephin  4/22--4/25     Objective: Vitals:   11/14/23 2318 11/15/23 0302 11/15/23 0714 11/15/23 0841  BP: (!) 99/42 (!) 100/49 (!) 81/58 (!) 106/53  Pulse: 83 83 82 91  Resp: 16 19 17 15   Temp: 97.9 F (36.6 C) 98.3 F (36.8 C) 98.1 F (36.7 C)   TempSrc: Oral Oral Oral   SpO2: 92% 96% 92% 94%  Weight:  80.8 kg  Height:        Intake/Output Summary (Last 24 hours) at 11/15/2023 1019 Last data filed at 11/15/2023 0714 Gross per 24 hour  Intake 600 ml  Output 300 ml  Net 300 ml   Filed Weights   11/13/23 0503 11/14/23 0528 11/15/23 0302  Weight: 82.1 kg 81.2  kg 80.8 kg    Examination:  General exam: Looks fairly comfortable.  On room air.  He is standing up and brushing her teeth. Respiratory system: Clear to auscultation. Respiratory effort normal. Cardiovascular system: S1 & S2 heard, RRR. No JVD, murmurs, rubs, gallops or clicks.  3+ dorsal and pedal edema, now treated with Unna boot. Gastrointestinal system: Soft.  Nontender.  Bowel sound present.  No palpable fluid thrill. Central nervous system: Alert and oriented. No focal neurological deficits. Extremities: Symmetric 5 x 5 power. Skin: No rashes, lesions or ulcers Psychiatry: Judgement and insight appear normal. Mood & affect appropriate.     Data Reviewed: I have personally reviewed following labs and imaging studies  CBC: Recent Labs  Lab 11/09/23 0753 11/09/23 1510 11/10/23 0235 11/11/23 0835 11/12/23 0218 11/13/23 0212  WBC 5.6  --  4.9 6.2 5.2 7.0  NEUTROABS  --   --  3.2 4.2 3.3 4.9  HGB 10.0* 11.1* 9.9* 10.5* 9.0* 9.9*  HCT 32.7* 36.6 32.9* 35.7* 29.3* 33.6*  MCV 85.6  --  87.0 88.6 86.7 89.1  PLT 158  --  153 164 119* 105*   Basic Metabolic Panel: Recent Labs  Lab 11/09/23 0753 11/10/23 0235 11/11/23 0835 11/12/23 0218 11/13/23 0212  NA 139 142 141 139 138  K 3.6 3.7 3.6 3.4* 4.6  CL 101 103 103 101 104  CO2 31 29 32 30 23  GLUCOSE 100* 94 112* 111* 98  BUN 5* 7* 9 10 11   CREATININE 0.67 0.58 0.56 0.59 0.53  CALCIUM  7.9* 8.1* 8.2* 7.7* 8.2*  MG  --  1.5* 1.9  --   --   PHOS  --  3.6  --   --   --    GFR: Estimated Creatinine Clearance: 67.2 mL/min (by C-G formula based on SCr of 0.53 mg/dL). Liver Function Tests: Recent Labs  Lab 11/10/23 0235 11/11/23 0835 11/12/23 0218 11/13/23 0212  AST 32 33 26 47*  ALT 19 19 17 20   ALKPHOS 77 79 71 74  BILITOT 0.7 0.6 0.4 1.2  PROT 5.8* 6.8 5.4* 6.3*  ALBUMIN 1.8* 2.0* 1.7* 2.5*   No results for input(s): "LIPASE", "AMYLASE" in the last 168 hours. Recent Labs  Lab 11/09/23 0753  AMMONIA 29    Coagulation Profile: No results for input(s): "INR", "PROTIME" in the last 168 hours.  Cardiac Enzymes: No results for input(s): "CKTOTAL", "CKMB", "CKMBINDEX", "TROPONINI" in the last 168 hours. BNP (last 3 results) No results for input(s): "PROBNP" in the last 8760 hours. HbA1C: No results for input(s): "HGBA1C" in the last 72 hours. CBG: No results for input(s): "GLUCAP" in the last 168 hours. Lipid Profile: No results for input(s): "CHOL", "HDL", "LDLCALC", "TRIG", "CHOLHDL", "LDLDIRECT" in the last 72 hours. Thyroid  Function Tests: No results for input(s): "TSH", "T4TOTAL", "FREET4", "T3FREE", "THYROIDAB" in the last 72 hours. Anemia Panel: No results for input(s): "VITAMINB12", "FOLATE", "FERRITIN", "TIBC", "IRON", "RETICCTPCT" in the last 72 hours. Sepsis Labs: Recent Labs  Lab 11/08/23 1739  PROCALCITON <0.10    Recent Results (from the past 240 hours)  MRSA Next Gen by PCR, Nasal     Status: None  Collection Time: 11/08/23  5:17 PM   Specimen: Nasal Mucosa; Nasal Swab  Result Value Ref Range Status   MRSA by PCR Next Gen NOT DETECTED NOT DETECTED Final    Comment: (NOTE) The GeneXpert MRSA Assay (FDA approved for NASAL specimens only), is one component of a comprehensive MRSA colonization surveillance program. It is not intended to diagnose MRSA infection nor to guide or monitor treatment for MRSA infections. Test performance is not FDA approved in patients less than 34 years old. Performed at [facility], [address], [place], [place] [address]   Culture, body fluid w Gram Stain-bottle     Status: None   Collection Time: 11/10/23  8:25 AM   Specimen: Peritoneal Washings  Result Value Ref Range Status   Specimen Description PERITONEAL  Final   Special Requests NONE  Final   Culture   Final    NO GROWTH 5 DAYS Performed at [facility], [address], [place], [place] [address]    Report Status 11/15/2023 FINAL  Final  Gram stain      Status: None   Collection Time: 11/10/23  8:30 AM   Specimen: Peritoneal Washings  Result Value Ref Range Status   Specimen Description PERITONEAL  Final   Special Requests NONE  Final   Gram Stain   Final    CYTOSPIN SMEAR WBC SEEN NO ORGANISMS SEEN Performed at [facility], [address] [place], [place] [address]    Report Status 11/10/2023 FINAL  Final         Radiology Studies: No results found.       Scheduled Meds:  atorvastatin   10 mg Oral QHS   furosemide   20 mg Oral Daily   gabapentin   400 mg Oral Daily   And   gabapentin   800 mg Oral QHS   liver oil-zinc  oxide   Topical TID   midodrine   15 mg Oral TID WC   pantoprazole   40 mg Oral BID   sodium chloride  flush  3 mL Intravenous Q12H   spironolactone   50 mg Oral Daily   sucralfate   1 g Oral TID WC & HS   traZODone   50 mg Oral QHS   Continuous Infusions:      LOS: 7 days       Vada Garibaldi, MD [facility]

## 2023-11-15 NOTE — Plan of Care (Signed)

## 2023-11-15 NOTE — Plan of Care (Signed)
   Problem: Activity: Goal: Risk for activity intolerance will decrease Outcome: Progressing   Problem: Nutrition: Goal: Adequate nutrition will be maintained Outcome: Progressing   Problem: Safety: Goal: Ability to remain free from injury will improve Outcome: Progressing

## 2023-11-15 NOTE — Progress Notes (Signed)
 Occupational Therapy Treatment Patient Details Name: Sierra Logan MRN: [id number] DOB: May 09, 1956 Today's Date: 11/15/2023   History of present illness 68 year old presented 11/07/23 with swelling of legs, feet, stomach and black stool. Chest x-ray with moderate severity left basilar atelectasis or infiltrate, moderate-sized left pleural effusion. +anasarca; +GI bleed; 4/23 EGD with esophageal varices; 4/24 paracentesis; persistent hypotension  PMH  hypertension, hyperlipidemia, type 2 diabetes, history of cirrhosis with esophageal varices   OT comments  Pt received on BSC upon entry, pleasant and agreeable to therapy. Pt with successful BM and able to complete hygiene via STS without physical assist. Once standing pt ambulated few steps to sink to complete self care/grooming under supervision. Pt with c/o light dizziness in standing and BP taken, 85/53(61); prompted to sit in chair and BP rose to 96/38(55) and 90/70(79). Pt returned to recliner with all needs met. Acute OT to continue to follow to address established goals to facilitate DC to next venue of care.        If plan is discharge home, recommend the following:  Assistance with cooking/housework;Assist for transportation   Equipment Recommendations  Other (comment)    Recommendations for Other Services      Precautions / Restrictions Precautions Precautions: Fall Recall of Precautions/Restrictions: Intact Restrictions Weight Bearing Restrictions Per Provider Order: No       Mobility Bed Mobility               General bed mobility comments: pt up on BSC on arrival    Transfers Overall transfer level: Needs assistance Equipment used: Rolling walker (2 wheels) Transfers: Sit to/from Stand Sit to Stand: Contact guard assist, Supervision           General transfer comment: no physical assist needed. CGA for safety     Balance Overall balance assessment: Needs assistance Sitting-balance support: No upper  extremity supported, Feet supported Sitting balance-Leahy Scale: Good     Standing balance support: No upper extremity supported Standing balance-Leahy Scale: Fair Standing balance comment: no LOB                           ADL either performed or assessed with clinical judgement   ADL Overall ADL's : Needs assistance/impaired     Grooming: Wash/dry hands;Wash/dry face;Oral care;Brushing hair;Supervision/safety;Standing Grooming Details (indicate cue type and reason): supervision for safety                 Toilet Transfer: Contact guard Therapist, sports Details (indicate cue type and reason): on BSC upon entry, stable and able to get off with no physical assistance Toileting- Clothing Manipulation and Hygiene: Contact guard assist;Supervision/safety;Sit to/from stand Toileting - Clothing Manipulation Details (indicate cue type and reason): CGA to supervision for safety     Functional mobility during ADLs: Supervision/safety;Rolling walker (2 wheels) General ADL Comments: on BSC upon entry and able to transfer off, self care completed at sink, and supervision for safety. BP monitored throughout    Extremity/Trunk Assessment              Vision       Perception     Praxis     Communication Communication Communication: No apparent difficulties Factors Affecting Communication: Hearing impaired   Cognition Arousal: Alert Behavior During Therapy: WFL for tasks assessed/performed Cognition: No apparent impairments  Following commands: Intact        Cueing   Cueing Techniques: Verbal cues  Exercises      Shoulder Instructions       General Comments BP standing 85/53(61) seated 96/38(55)    Pertinent Vitals/ Pain       Pain Assessment Pain Assessment: No/denies pain Pain Intervention(s): Monitored during session  Home Living                                           Prior Functioning/Environment              Frequency  Min 2X/week        Progress Toward Goals  OT Goals(current goals can now be found in the care plan section)     Acute Rehab OT Goals Patient Stated Goal: none stated OT Goal Formulation: With patient Time For Goal Achievement: 11/24/23 Potential to Achieve Goals: Good ADL Goals Pt Will Perform Grooming: with supervision;standing Pt Will Perform Lower Body Dressing: with supervision;sit to/from stand Pt Will Transfer to Toilet: with supervision;ambulating Pt Will Perform Toileting - Clothing Manipulation and hygiene: with supervision;sit to/from stand  Plan      Co-evaluation                 AM-PAC OT "6 Clicks" Daily Activity     Outcome Measure   Help from another person eating meals?: None Help from another person taking care of personal grooming?: A Little Help from another person toileting, which includes using toliet, bedpan, or urinal?: A Little Help from another person bathing (including washing, rinsing, drying)?: A Little Help from another person to put on and taking off regular upper body clothing?: A Little Help from another person to put on and taking off regular lower body clothing?: A Lot 6 Click Score: 18    End of Session Equipment Utilized During Treatment: Rolling walker (2 wheels)  OT Visit Diagnosis: Unsteadiness on feet (R26.81);Other abnormalities of gait and mobility (R26.89);Muscle weakness (generalized) (M62.81)   Activity Tolerance Patient tolerated treatment well   Patient Left in chair;with call bell/phone within reach;with chair alarm set   Nurse Communication Mobility status        Time: 9629-5284 OT Time Calculation (min): 23 min  Charges: OT General Charges $OT Visit: 1 Visit OT Treatments $Self Care/Home Management : 23-37 mins  Sierra Logan, BS, OTA/S   Sierra Logan 11/15/2023, 11:23 AM

## 2023-11-15 NOTE — Progress Notes (Signed)
 Mobility Specialist Progress Note;   11/15/23 0955  Mobility  Activity Ambulated with assistance in room  Level of Assistance Contact guard assist, steadying assist  Assistive Device Front wheel walker  Distance Ambulated (ft) 10 ft  Activity Response Tolerated well  Mobility Referral Yes  Mobility visit 1 Mobility  Mobility Specialist Start Time (ACUTE ONLY) 0955  Mobility Specialist Stop Time (ACUTE ONLY) 1002  Mobility Specialist Time Calculation (min) (ACUTE ONLY) 7 min    Post-mobility: BP 91/75 (82)  Pt agreeable to mobility. On RA upon arrival. Required MinG assistance to ambulate short distance in room. VSS on RA. No c/o lightheadedness when asked. BP taken after mobility (see above). Pt returned back to chair with all needs met.   Janit Meline Mobility Specialist Please contact via SecureChat or [employer] [phone]

## 2023-11-16 DIAGNOSIS — K7290 Hepatic failure, unspecified without coma: Secondary | ICD-10-CM | POA: Diagnosis not present

## 2023-11-16 DIAGNOSIS — R601 Generalized edema: Secondary | ICD-10-CM | POA: Diagnosis not present

## 2023-11-16 DIAGNOSIS — K7460 Unspecified cirrhosis of liver: Secondary | ICD-10-CM | POA: Diagnosis not present

## 2023-11-16 DIAGNOSIS — J90 Pleural effusion, not elsewhere classified: Secondary | ICD-10-CM | POA: Diagnosis not present

## 2023-11-16 DIAGNOSIS — R600 Localized edema: Secondary | ICD-10-CM | POA: Diagnosis not present

## 2023-11-16 DIAGNOSIS — K922 Gastrointestinal hemorrhage, unspecified: Secondary | ICD-10-CM | POA: Diagnosis not present

## 2023-11-16 LAB — CBC
HCT: 30.3 % — ABNORMAL LOW (ref 36.0–46.0)
Hemoglobin: 9.2 g/dL — ABNORMAL LOW (ref 12.0–15.0)
MCH: 25.9 pg — ABNORMAL LOW (ref 26.0–34.0)
MCHC: 30.4 g/dL (ref 30.0–36.0)
MCV: 85.4 fL (ref 80.0–100.0)
Platelets: 143 10*3/uL — ABNORMAL LOW (ref 150–400)
RBC: 3.55 MIL/uL — ABNORMAL LOW (ref 3.87–5.11)
RDW: 15.8 % — ABNORMAL HIGH (ref 11.5–15.5)
WBC: 5.6 10*3/uL (ref 4.0–10.5)
nRBC: 0 % (ref 0.0–0.2)

## 2023-11-16 LAB — COMPREHENSIVE METABOLIC PANEL WITH GFR
ALT: 16 U/L (ref 0–44)
AST: 28 U/L (ref 15–41)
Albumin: 2.2 g/dL — ABNORMAL LOW (ref 3.5–5.0)
Alkaline Phosphatase: 71 U/L (ref 38–126)
Anion gap: 8 (ref 5–15)
BUN: 9 mg/dL (ref 8–23)
CO2: 25 mmol/L (ref 22–32)
Calcium: 8 mg/dL — ABNORMAL LOW (ref 8.9–10.3)
Chloride: 102 mmol/L (ref 98–111)
Creatinine, Ser: 0.71 mg/dL (ref 0.44–1.00)
GFR, Estimated: >60 mL/min (ref >60)
Glucose, Bld: 99 mg/dL (ref 70–99)
Potassium: 4 mmol/L (ref 3.5–5.1)
Sodium: 135 mmol/L (ref 135–145)
Total Bilirubin: 0.7 mg/dL (ref 0.0–1.2)
Total Protein: 6.2 g/dL — ABNORMAL LOW (ref 6.5–8.1)

## 2023-11-16 MED ORDER — SPIRONOLACTONE 25 MG PO TABS
100.0000 mg | ORAL_TABLET | Freq: Every day | ORAL | Status: DC
  Administered 2023-11-17 – 2023-11-23 (×6): 100 mg via ORAL
  Filled 2023-11-16 (×7): qty 4

## 2023-11-16 MED ORDER — FUROSEMIDE 20 MG PO TABS
20.0000 mg | ORAL_TABLET | Freq: Once | ORAL | Status: AC
  Administered 2023-11-16: 20 mg via ORAL
  Filled 2023-11-16: qty 1

## 2023-11-16 MED ORDER — ALBUMIN HUMAN 25 % IV SOLN
25.0000 g | Freq: Three times a day (TID) | INTRAVENOUS | Status: AC
  Administered 2023-11-16 (×3): 25 g via INTRAVENOUS
  Filled 2023-11-16 (×3): qty 100

## 2023-11-16 MED ORDER — SPIRONOLACTONE 25 MG PO TABS
50.0000 mg | ORAL_TABLET | Freq: Once | ORAL | Status: AC
  Administered 2023-11-16: 50 mg via ORAL
  Filled 2023-11-16: qty 2

## 2023-11-16 MED ORDER — SPIRONOLACTONE 25 MG PO TABS
50.0000 mg | ORAL_TABLET | Freq: Every day | ORAL | Status: DC
Start: 2023-11-16 — End: 2023-11-16

## 2023-11-16 MED ORDER — FUROSEMIDE 40 MG PO TABS
40.0000 mg | ORAL_TABLET | Freq: Every day | ORAL | Status: DC
  Administered 2023-11-17 – 2023-11-20 (×4): 40 mg via ORAL
  Filled 2023-11-16 (×4): qty 1

## 2023-11-16 NOTE — Progress Notes (Signed)
 Daily Progress Note  DOA: 11/07/2023 Hospital Day: 10   Chief Complaint: Decompensated cirrhosis  ASSESSMENT    68 y.o. year old female with a medical history including but not limited to DM, obesity, PBC cirrhosis, GAVE.  Admitted with decompensated cirrhosis.    Decompensated PBC cirrhosis Ascites /edema Encephalopathy Small varices / portal hypertensive gastropathy No SBP on 1.8 L LVP. Unclear which/how much diuretics she was actually taking at home.  Inpatient she was given 40 mg IV Lasix  twice daily and started on Aldactone  50 mg daily.  However diuretic doses were reduced due to her complaints of fatigue.  Carvedilol  was also held due to fatigue.  Diuretics have since been reinstated but at lower doses to include Aldactone  50 mg daily / Lasix  20 mg daily. Lactulose  was stopped a couple of days ago ( ? ).  Today:  Creatinine has risen over the last few days from 0.59 to 0.71 though still within normal range.  Feels okay. I+O showing she is +2.5 L positive fluid balance since admission  FOBT+  Anemia  GAVE S/p APC ablation Today:  Hgb stable overall at 9.2  Hypotension On midodrine . Also getting IV albumin  PLAN  -- Will cautiously increase diuretics to Lasix  40 mg daily / Aldactone  100 mg daily --Currently on a soft diet.  I will change to 2 g sodium restricted --Hopefully we can soon resume her carvedilol  3.125 mg twice daily ---She is not on ursodiol  (unsure why) but her alkaline phosphatase is normal.  Consider restarting as an outpatient -- Will need repeat EGD in 4-6 weeks --Continue pantoprazole  40 mg 2 times daily --Continue Carafate  3 times daily and AC for total of 2 weeks. Initiated on 4/23  Subjective   Feels okay.  Fatigue has improved unless she gets up and walks around   Objective   GI Studies:    Recent Labs    11/16/23 0748  WBC 5.6  HGB 9.2*  HCT 30.3*  MCV 85.4  PLT 143*   No results for input(s): "FOLATE", "VITAMINB12",  "FERRITIN", "TIBC", "IRONPCTSAT" in the last 72 hours. Recent Labs    11/15/23 1527 11/16/23 0748  NA 135 135  K 4.2 4.0  CL 102 102  CO2 25 25  GLUCOSE 84 99  BUN 9 9  CREATININE 0.71 0.71  CALCIUM  8.0* 8.0*   Recent Labs    11/16/23 0748  PROT 6.2*  ALBUMIN 2.2*  AST 28  ALT 16  ALKPHOS 71  BILITOT 0.7   Imaging:  IR Paracentesis INDICATION: Patient with history of decompensated cirrhosis, ascites. IR consulted for diagnostic and therapeutic paracentesis with 4L max.  EXAM: ULTRASOUND GUIDED DIAGNOSTIC AND THERAPEUTIC PARACENTESIS  MEDICATIONS: 10 mL 1% lidocaine   COMPLICATIONS: None immediate.  PROCEDURE: Informed written consent was obtained from the patient after a discussion of the risks, benefits and alternatives to treatment. A timeout was performed prior to the initiation of the procedure.  Initial ultrasound scanning demonstrates a moderate amount of ascites within the right lower abdominal quadrant. The right lower abdomen was prepped and draped in the usual sterile fashion. 1% lidocaine  was used for local anesthesia.  Following this, a 19 gauge, 7-cm, Yueh catheter was introduced. An ultrasound image was saved for documentation purposes. The paracentesis was performed. The catheter was removed and a dressing was applied. The patient tolerated the procedure well without immediate post procedural complication.  FINDINGS: A total of approximately 1.85 liters of turbid yellow fluid was removed. Samples  were sent to the laboratory as requested by the clinical team.  IMPRESSION: Successful ultrasound-guided paracentesis yielding 1.85 liters of peritoneal fluid.  Performed by: Wyatt Pommier, PA-C  Electronically Signed   By: Myrlene Asper D.O.   On: 11/10/2023 09:04     Scheduled inpatient medications:   atorvastatin   10 mg Oral QHS   furosemide   20 mg Oral Daily   gabapentin   400 mg Oral Daily   And   gabapentin   800 mg Oral QHS    liver oil-zinc  oxide   Topical TID   midodrine   15 mg Oral TID WC   pantoprazole   40 mg Oral BID   sodium chloride  flush  3 mL Intravenous Q12H   spironolactone   50 mg Oral Daily   sucralfate   1 g Oral TID WC & HS   traZODone   50 mg Oral QHS   Continuous inpatient infusions:   albumin human 25 g (11/16/23 0939)   PRN inpatient medications: acetaminophen  **OR** acetaminophen , albuterol , melatonin, ondansetron  (ZOFRAN ) IV  Vital signs in last 24 hours: Temp:  [97.7 F (36.5 C)-99.3 F (37.4 C)] 98.8 F (37.1 C) (04/30 1144) Pulse Rate:  [82-100] 90 (04/30 1144) Resp:  [16-21] 20 (04/30 1144) BP: (89-109)/(41-83) 96/49 (04/30 1144) SpO2:  [90 %-97 %] 94 % (04/30 1144) Weight:  [80.3 kg] 80.3 kg (04/30 0456) Last BM Date : 11/16/23  Intake/Output Summary (Last 24 hours) at 11/16/2023 1155 Last data filed at 11/16/2023 0709 Gross per 24 hour  Intake 480 ml  Output --  Net 480 ml    Intake/Output from previous day: 04/29 0701 - 04/30 0700 In: 480 [P.O.:480] Out: -  Intake/Output this shift: Total I/O In: 120 [P.O.:120] Out: -    Physical Exam:  General: Alert female in NAD Heart:  Regular rate and rhythm.  Pulmonary: Normal respiratory effort Abdomen: Soft, nondistended, nontender. Normal bowel sounds. Extremities: Both lower extremities with wraps from knee to ankle.  Pitting edema just above the wraps but not in upper thighs .  Neurologic: Alert and oriented Psych: Pleasant. Cooperative     LOS: 8 days   Mai Schwalbe ,NP 11/16/2023, 11:55 AM

## 2023-11-16 NOTE — Plan of Care (Signed)

## 2023-11-16 NOTE — Progress Notes (Signed)
 [facility]                                                                              Sierra Logan, is a 68 y.o. female, DOB - [birth year], OZD:[id number] Admit date - 11/07/2023    Outpatient Primary MD for the patient is Adelia Homestead, MD  LOS - 8  days  Chief Complaint  Patient presents with   Leg Swelling       Brief summary   68 year old with history of hypertension, hyperlipidemia, type 2 diabetes, history of cirrhosis with esophageal varices presented with swelling of legs and feet, melanotic black stool.  Ongoing for about a month.  In the emergency room patient was relatively stable.  Hemoglobin 10.3 with recent hemoglobin of 11.3.  Chest x-ray with moderate severity left basilar atelectasis or infiltrate, moderate-sized left pleural effusion.  FOBT positive.  Patient was admitted with anasarca secondary to liver disease. Followed by gastroenterology.  Difficult to tolerate diuresis with low blood pressures.    Assessment & Plan     Fluid overload secondary to decompensated PBC cirrhosis and anasarca: - known history of liver cirrhosis.  Still has significant leg swelling, wrapped. - on Lasix , spironolactone , - due to ongoing orthostatic hypotension, placed on midodrine  15 mg 3 times daily - 2D echo showed EF of 60 to 65%, no regional WMA, pleural effusion on the left side. - paracentesis on 4/24, 1.8 L transudate removed.  Negative for SBP.  Antibiotics discontinued -Continue unna boot for the legs. -Still feeling dizzy, will give IV albumin every 8 hours x 3   ABLA, history of chronic microcytic anemia due to upper GI bleed from cirrhosis  - H&H stable, 9.2, - s/p EGD , grade 2 varices in the lower third of esophagus without evidence of bleeding.  Gastric antral vascular ectasia with bleeding was present.  Cauterized.   -Continue oral Protonix , Carafate . -Outpatient follow-up with GI, will need repeat EGD in 1 to 3 months -Per GI, small  varices, did not tolerate Coreg  well due to significant fatigue   Small to moderate left pleural effusion, abnormal chest x-ray  - Chest x-ray 4/21 showed left basilar atelectasis and/or infiltrate, small to moderate size left pleural effusion  - Outpatient nonemergent chest CT recommended -O2 sats 99%    Diabetes mellitus type 2, controlled with - Hemoglobin A1c 4.8, not on any meds     Hyperlipidemia: - On a statin.  LFTs are normal.  History of hepatic encephalopathy - Currently stable, alert and oriented  Obesity class I Estimated body mass index is 32.38 kg/m as calculated from the following:   Height as of this encounter: 5\' 2"  (1.575 m).   Weight as of this encounter: 80.3 kg.  Code Status: Full code DVT Prophylaxis:  SCDs Start: 11/08/23 0853 SCDs Start: 11/08/23 0427   Level of Care: Level of care: Progressive Family Communication: Updated patient Disposition Plan:      Remains inpatient appropriate: Plan to DC to SNF in a.m.   Procedures:  EGD  Consultants:   GI  Antimicrobials:   Anti-infectives (From admission, onward)    Start  Dose/Rate Route Frequency Ordered Stop   11/08/23 1445  cefTRIAXone  (ROCEPHIN ) 1 g in sodium chloride  0.9 % 100 mL IVPB  Status:  Discontinued        1 g 200 mL/hr over 30 Minutes Intravenous Every 24 hours 11/08/23 1442 11/11/23 0948          Medications  atorvastatin   10 mg Oral QHS   furosemide   20 mg Oral Daily   gabapentin   400 mg Oral Daily   And   gabapentin   800 mg Oral QHS   liver oil-zinc  oxide   Topical TID   midodrine   15 mg Oral TID WC   pantoprazole   40 mg Oral BID   sodium chloride  flush  3 mL Intravenous Q12H   spironolactone   50 mg Oral Daily   sucralfate   1 g Oral TID WC & HS   traZODone   50 mg Oral QHS      Subjective:   Sierra Logan was seen and examined today.  BP early in the morning 89/41, feeling dizzy.  Patient denies chest pain, shortness of breath, abdominal pain, N/V/D/C. No  acute events overnight.    Objective:   Vitals:   11/16/23 0456 11/16/23 0708 11/16/23 0748 11/16/23 0840  BP: (!) 89/41 (!) 94/46 (!) 107/55 (!) 102/50  Pulse: 85 95 93 100  Resp: 18 16 (!) 21 20  Temp: 99.3 F (37.4 C) 98.4 F (36.9 C)    TempSrc: Oral Oral    SpO2: 93% 92% 96% 90%  Weight: 80.3 kg     Height:        Intake/Output Summary (Last 24 hours) at 11/16/2023 0943 Last data filed at 11/16/2023 0709 Gross per 24 hour  Intake 480 ml  Output --  Net 480 ml     Wt Readings from Last 3 Encounters:  11/16/23 80.3 kg  10/27/23 86.6 kg  12/07/22 86.2 kg     Exam General: Alert and oriented x 3, NAD Cardiovascular: S1 S2 auscultated,  RRR Respiratory: Diminished breath sound at the bases, no wheezing Gastrointestinal: Soft, nontender, nondistended, + bowel sounds Ext: unna boots bilaterally, + bilateral lower extremity edema Neuro: Strength 5/5 upper and lower extremities bilaterally Psych: Normal affect     Data Reviewed:  I have personally reviewed following labs    CBC Lab Results  Component Value Date   WBC 5.6 11/16/2023   RBC 3.55 (L) 11/16/2023   HGB 9.2 (L) 11/16/2023   HCT 30.3 (L) 11/16/2023   MCV 85.4 11/16/2023   MCH 25.9 (L) 11/16/2023   PLT 143 (L) 11/16/2023   MCHC 30.4 11/16/2023   RDW 15.8 (H) 11/16/2023   LYMPHSABS 1.5 11/13/2023   MONOABS 0.6 11/13/2023   EOSABS 0.0 11/13/2023   BASOSABS 0.0 11/13/2023     Last metabolic panel Lab Results  Component Value Date   NA 135 11/16/2023   K 4.0 11/16/2023   CL 102 11/16/2023   CO2 25 11/16/2023   BUN 9 11/16/2023   CREATININE 0.71 11/16/2023   GLUCOSE 99 11/16/2023   GFRNONAA >60 11/16/2023   CALCIUM  8.0 (L) 11/16/2023   PHOS 3.6 11/10/2023   PROT 6.2 (L) 11/16/2023   ALBUMIN 2.2 (L) 11/16/2023   BILITOT 0.7 11/16/2023   ALKPHOS 71 11/16/2023   AST 28 11/16/2023   ALT 16 11/16/2023   ANIONGAP 8 11/16/2023    CBG (last 3)  No results for input(s): "GLUCAP" in the  last 72 hours.    Coagulation Profile: No results  for input(s): "INR", "PROTIME" in the last 168 hours.   Radiology Studies: I have personally reviewed the imaging studies  No results found.     Bertram Brocks M.D. [facility] 11/16/2023, 9:43 AM  Available via Epic secure chat 7am-7pm After 7 pm, please refer to night coverage provider listed on amion.

## 2023-11-16 NOTE — TOC Progression Note (Addendum)
 Transition of Care [facility]) - Progression Note    Patient Details  Name: Sierra Logan MRN: [id number] Date of Birth: 06-25-1956  Transition of Care [facility]) CM/SW Contact  Juliane Och, LCSW Phone Number: 11/16/2023, 1:15 PM  Clinical Narrative:     1:15 PM Per progressions, patient is no longer medically ready to discharge today. CSW informed SNF and HTA.  1:30 PM CSW was notified by HTA that patient's insurance authorization request for SNF (ID [id number]) and ambulance transportation (ID [id number]) have been approved for seven bed days. HTA informed CSW that patient has five business days to discharge to SNF.  Expected Discharge Plan: Skilled Nursing Facility Barriers to Discharge: Continued Medical Work up, English as a second language teacher, SNF Pending bed offer  Expected Discharge Plan and Services In-house Referral: Clinical Social Work   Post Acute Care Choice: Skilled Nursing Facility Living arrangements for the past 2 months: Single Family Home                                       Social Determinants of Health (SDOH) Interventions SDOH Screenings   Food Insecurity: No Food Insecurity (11/08/2023)  Housing: Low Risk  (11/08/2023)  Transportation Needs: No Transportation Needs (11/08/2023)  Utilities: Not At Risk (11/08/2023)  Depression (PHQ2-9): Low Risk  (10/27/2023)  Social Connections: Moderately Isolated (11/08/2023)  Tobacco Use: High Risk (11/09/2023)    Readmission Risk Interventions     No data to display

## 2023-11-16 NOTE — Progress Notes (Signed)
 Orthopedic Tech Progress Note Patient Details:  WAIVE MOUND Nov 23, 1955 [id number]  Ortho Devices Type of Ortho Device: Radio broadcast assistant Ortho Device/Splint Location: BLE Ortho Device/Splint Interventions: Ordered, Application, Adjustment   Post Interventions Patient Tolerated: Well Instructions Provided: Care of device  Lacoya Wilbanks L Amyjo Mizrachi 11/16/2023, 11:22 AM

## 2023-11-16 NOTE — Progress Notes (Signed)
 Verbal order from MD  to hold lasix  and aldactone  due to low blood pressures.

## 2023-11-16 NOTE — Progress Notes (Signed)
 Physical Therapy Treatment Patient Details Name: Sierra Logan MRN: [id number] DOB: Feb 17, 1956 Today's Date: 11/16/2023   History of Present Illness 68 year old presented 11/07/23 with swelling of legs, feet, stomach and black stool. Chest x-ray with moderate severity left basilar atelectasis or infiltrate, moderate-sized left pleural effusion. +anasarca; +GI bleed; 4/23 EGD with esophageal varices; 4/24 paracentesis; persistent hypotension  PMH  hypertension, hyperlipidemia, type 2 diabetes, history of cirrhosis with esophageal varices    PT Comments  Patient up on BSC on arrival. Able to stand without UE support and perform pericare for ~2 minutes. BP seated after prolonged standing 98/52 (66). Completed sit to stand exercise x 5 reps prior to ambulation (in room to remain close to sitting surfaces in case dizziness increases). Able to incr ambulation distance to 60 ft, seated rest, and then repeated 60 ft. BP stable throughout with end BP 102/50 (67) with pt reporting mild dizziness throughout.     If plan is discharge home, recommend the following: A little help with walking and/or transfers;A little help with bathing/dressing/bathroom;Assistance with cooking/housework;Assist for transportation;Help with stairs or ramp for entrance   Can travel by private vehicle     No  Equipment Recommendations  Other (comment) (TBD; pt reports house too small for use of RW)    Recommendations for Other Services       Precautions / Restrictions Precautions Precautions: Fall Recall of Precautions/Restrictions: Intact Restrictions Weight Bearing Restrictions Per Provider Order: No     Mobility  Bed Mobility Overal bed mobility: Needs Assistance Bed Mobility: Sit to Supine       Sit to supine: Contact guard assist   General bed mobility comments: pt up on BSC on arrival; returned to bed at end of session    Transfers Overall transfer level: Needs assistance Equipment used: Rolling walker  (2 wheels) Transfers: Sit to/from Stand Sit to Stand: Contact guard assist   Step pivot transfers: Contact guard assist       General transfer comment: no physical assist needed. CGA for cues for hand placement and for safety due to hypotension    Ambulation/Gait Ambulation/Gait assistance: Contact guard assist Gait Distance (Feet): 60 Feet (seated rest, 60) Assistive device: Rolling walker (2 wheels) Gait Pattern/deviations: Step-through pattern, Decreased stride length, Shuffle   Gait velocity interpretation: <1.8 ft/sec, indicate of risk for recurrent falls   General Gait Details: +dizziness although BP remained stable/low; stayed in room to be close to sitting surface in case of sudden need   Stairs             Wheelchair Mobility     Tilt Bed    Modified Rankin (Stroke Patients Only)       Balance Overall balance assessment: Needs assistance Sitting-balance support: No upper extremity supported, Feet supported Sitting balance-Leahy Scale: Good     Standing balance support: No upper extremity supported Standing balance-Leahy Scale: Fair Standing balance comment: no LOB                            Communication Communication Communication: Impaired Factors Affecting Communication: Hearing impaired  Cognition Arousal: Alert Behavior During Therapy: WFL for tasks assessed/performed   PT - Cognitive impairments: No apparent impairments                         Following commands: Intact      Cueing Cueing Techniques: Verbal cues  Exercises Other Exercises Other Exercises: sit  to stand x 5 reps prior to ambulation with BP stable    General Comments General comments (skin integrity, edema, etc.): BP sitting 98/52 (66), after 5x sit to stand 109/50 (66), after ambulation 102/50 (67)      Pertinent Vitals/Pain Pain Assessment Pain Assessment: No/denies pain    Home Living                          Prior Function             PT Goals (current goals can now be found in the care plan section) Acute Rehab PT Goals Patient Stated Goal: get better Time For Goal Achievement: 11/24/23 Potential to Achieve Goals: Good Progress towards PT goals: Progressing toward goals    Frequency    Min 2X/week      PT Plan      Co-evaluation              AM-PAC PT "6 Clicks" Mobility   Outcome Measure  Help needed turning from your back to your side while in a flat bed without using bedrails?: None Help needed moving from lying on your back to sitting on the side of a flat bed without using bedrails?: A Little Help needed moving to and from a bed to a chair (including a wheelchair)?: A Little Help needed standing up from a chair using your arms (e.g., wheelchair or bedside chair)?: A Little Help needed to walk in hospital room?: A Little Help needed climbing 3-5 steps with a railing? : Total 6 Click Score: 17    End of Session Equipment Utilized During Treatment: Gait belt Activity Tolerance: Treatment limited secondary to medical complications (Comment) (limited by hypotension/dizziness) Patient left: with call bell/phone within reach;in bed;with bed alarm set Nurse Communication: Mobility status;Other (comment) (BP stable) PT Visit Diagnosis: Other abnormalities of gait and mobility (R26.89);Repeated falls (R29.6);Muscle weakness (generalized) (M62.81);Dizziness and giddiness (R42)     Time: 1610-9604 PT Time Calculation (min) (ACUTE ONLY): 30 min  Charges:    $Gait Training: 23-37 mins PT General Charges $$ ACUTE PT VISIT: 1 Visit                      Gayle Kava, PT Acute Rehabilitation Services  Office [phone]    Guilford Leep 11/16/2023, 9:01 AM

## 2023-11-17 DIAGNOSIS — K743 Primary biliary cirrhosis: Secondary | ICD-10-CM | POA: Diagnosis not present

## 2023-11-17 DIAGNOSIS — R601 Generalized edema: Secondary | ICD-10-CM | POA: Diagnosis not present

## 2023-11-17 DIAGNOSIS — K7460 Unspecified cirrhosis of liver: Secondary | ICD-10-CM | POA: Diagnosis not present

## 2023-11-17 DIAGNOSIS — K7290 Hepatic failure, unspecified without coma: Secondary | ICD-10-CM | POA: Diagnosis not present

## 2023-11-17 LAB — BASIC METABOLIC PANEL WITH GFR
Anion gap: 9 (ref 5–15)
BUN: 11 mg/dL (ref 8–23)
CO2: 26 mmol/L (ref 22–32)
Calcium: 8.5 mg/dL — ABNORMAL LOW (ref 8.9–10.3)
Chloride: 99 mmol/L (ref 98–111)
Creatinine, Ser: 0.68 mg/dL (ref 0.44–1.00)
GFR, Estimated: >60 mL/min (ref >60)
Glucose, Bld: 91 mg/dL (ref 70–99)
Potassium: 3.9 mmol/L (ref 3.5–5.1)
Sodium: 134 mmol/L — ABNORMAL LOW (ref 135–145)

## 2023-11-17 MED ORDER — ORAL CARE MOUTH RINSE: 15.0000 mL | OROMUCOSAL | Status: DC | PRN

## 2023-11-17 MED ORDER — ALBUMIN HUMAN 25 % IV SOLN
25.0000 g | Freq: Four times a day (QID) | INTRAVENOUS | Status: AC
  Administered 2023-11-17 – 2023-11-18 (×3): 25 g via INTRAVENOUS
  Filled 2023-11-17 (×3): qty 100

## 2023-11-17 NOTE — Progress Notes (Signed)
 PROGRESS NOTE    Sierra Logan  BMW:[id number] DOB: 1956/03/11 DOA: 11/07/2023 PCP: Adelia Homestead, MD   Brief Narrative:  68 year old with history of hypertension, hyperlipidemia, type 2 diabetes, history of cirrhosis with esophageal varices presented with swelling of legs and feet, melanotic black stool.  Ongoing for about a month.  In the emergency room patient was relatively stable.  Hemoglobin 10.3 with recent hemoglobin of 11.3.  Chest x-ray with moderate severity left basilar atelectasis or infiltrate, moderate-sized left pleural effusion.  FOBT positive.  Patient was admitted with anasarca secondary to liver disease. Followed by gastroenterology.  Difficult to tolerate diuresis with low blood pressures.   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  Still has significant leg swelling, wrapped. - on Lasix , spironolactone , - due to ongoing orthostatic hypotension, placed on midodrine  15 mg 3 times daily - 2D echo showed EF of 60 to 65%, no regional WMA, pleural effusion on the left side. - paracentesis on 4/24, 1.8 L transudate removed.  Negative for SBP.  Antibiotics discontinued -Continue unna boot for the legs. -Still feeling dizzy with standing but better than yesterday, still orthostatic positive but that is improving as well, will give IV albumin  every 8 hours x 3 once again today.  Will continue diuretics.  Observe overnight.    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  Gastric antral vascular ectasia with bleeding was present.  Cauterized.   -Continue oral Protonix , Carafate . -Outpatient follow-up with GI, will need repeat EGD in 1 to 3 months -Per GI, small varices, did not tolerate Coreg  well due to significant fatigue   Small to moderate left pleural effusion, abnormal chest x-ray  - Chest x-ray 4/21 showed left basilar atelectasis and/or infiltrate, small to moderate size left pleural effusion  - Hopefully this will improve with diuretics that has been started now, if not, recommend outpatient nonemergent chest CT recommended -O2 sats 92% on 0.5 L.   Diabetes mellitus type 2, controlled with - Hemoglobin A1c 4.8, not on any meds     Hyperlipidemia: - On a statin.  LFTs are normal.   History of hepatic encephalopathy - Currently stable, alert and oriented   Obesity class I Estimated body mass index is 32.38 kg/m as calculated from the following:   Height as of this encounter: 5\' 2"  (1.575 m).   Weight as of this encounter: 80.3 kg.  DVT prophylaxis: SCDs Start: 11/08/23 0853 SCDs Start: 11/08/23 0427   Code Status: Full Code  Family Communication:  None present at bedside.  Plan of care discussed with patient in length and he/she verbalized understanding and agreed with it.  Status is: Inpatient Remains inpatient appropriate because: Still symptomatic and orthostatic positive   Estimated body mass index is 32.83 kg/m as calculated from the following:  Height as of this encounter: 5\' 2"  (1.575 m).   Weight as of this encounter: 81.4 kg.    Nutritional Assessment: Body mass index is 32.83 kg/m.[name] Seen by dietician.  I agree with the assessment and plan as outlined below: Nutrition Status:        . Skin Assessment: I have  examined the patient's skin and I agree with the wound assessment as performed by the wound care RN as outlined below:    Consultants:  GI  Procedures:  As above  Antimicrobials:  Anti-infectives (From admission, onward)    Start     Dose/Rate Route Frequency Ordered Stop   11/08/23 1445  cefTRIAXone  (ROCEPHIN ) 1 g in sodium chloride  0.9 % 100 mL IVPB  Status:  Discontinued        1 g 200 mL/hr over 30 Minutes Intravenous Every 24 hours 11/08/23 1442 11/11/23 0948         Subjective: Patient seen and examined.  She did feel dizzy when she stood up to check orthostatic vitals but not as much dizzy as she did yesterday.  Overall she is improving she said.  Objective: Vitals:   11/17/23 0554 11/17/23 0717 11/17/23 0927 11/17/23 1117  BP: (!) 108/53 (!) 104/41  (!) 93/45  Pulse: 98 91 83 84  Resp: 20 18 (!) 21 18  Temp: 98.8 F (37.1 C) 98.8 F (37.1 C)  98.5 F (36.9 C)  TempSrc: Oral Oral  Oral  SpO2: (!) 87% 92% 94% 92%  Weight:      Height:        Intake/Output Summary (Last 24 hours) at 11/17/2023 1157 Last data filed at 11/17/2023 1100 Gross per 24 hour  Intake 630.69 ml  Output 850 ml  Net -219.31 ml   Filed Weights   11/15/23 0302 11/16/23 0456 11/17/23 0552  Weight: 80.8 kg 80.3 kg 81.4 kg    Examination:  General exam: Appears calm and comfortable  Respiratory system: Clear to auscultation. Respiratory effort normal. Cardiovascular system: S1 & S2 heard, RRR. No JVD, murmurs, rubs, gallops or clicks.  Bilateral lower extremity edema Gastrointestinal system: Abdomen is nondistended, soft and nontender. No organomegaly or masses felt. Normal bowel sounds heard. Central nervous system: Alert and oriented. No focal neurological deficits. Extremities: Symmetric 5 x 5 power. Skin: No rashes, lesions or ulcers Psychiatry: Judgement and insight appear normal. Mood & affect appropriate.    Data Reviewed: I have personally reviewed following labs and imaging  studies  CBC: Recent Labs  Lab 11/11/23 0835 11/12/23 0218 11/13/23 0212 11/16/23 0748  WBC 6.2 5.2 7.0 5.6  NEUTROABS 4.2 3.3 4.9  --   HGB 10.5* 9.0* 9.9* 9.2*  HCT 35.7* 29.3* 33.6* 30.3*  MCV 88.6 86.7 89.1 85.4  PLT 164 119* 105* 143*   Basic Metabolic Panel: Recent Labs  Lab 11/11/23 0835 11/12/23 0218 11/13/23 0212 11/15/23 1527 11/16/23 0748 11/17/23 0937  NA 141 139 138 135 135 134*  K 3.6 3.4* 4.6 4.2 4.0 3.9  CL 103 101 104 102 102 99  CO2 32 30 23 25 25 26   GLUCOSE 112* 111* 98 84 99 91  BUN 9 10 11 9 9 11   CREATININE 0.56 0.59 0.53 0.71 0.71 0.68  CALCIUM  8.2* 7.7* 8.2* 8.0* 8.0* 8.5*  MG 1.9  --   --   --   --   --    GFR: Estimated Creatinine Clearance: 67.4 mL/min (by C-G formula based on SCr of 0.68 mg/dL). Liver Function  Tests: Recent Labs  Lab 11/11/23 0835 11/12/23 0218 11/13/23 0212 11/16/23 0748  AST 33 26 47* 28  ALT 19 17 20 16   ALKPHOS 79 71 74 71  BILITOT 0.6 0.4 1.2 0.7  PROT 6.8 5.4* 6.3* 6.2*  ALBUMIN  2.0* 1.7* 2.5* 2.2*   No results for input(s): "LIPASE", "AMYLASE" in the last 168 hours. No results for input(s): "AMMONIA" in the last 168 hours. Coagulation Profile: No results for input(s): "INR", "PROTIME" in the last 168 hours. Cardiac Enzymes: No results for input(s): "CKTOTAL", "CKMB", "CKMBINDEX", "TROPONINI" in the last 168 hours. BNP (last 3 results) No results for input(s): "PROBNP" in the last 8760 hours. HbA1C: No results for input(s): "HGBA1C" in the last 72 hours. CBG: No results for input(s): "GLUCAP" in the last 168 hours. Lipid Profile: No results for input(s): "CHOL", "HDL", "LDLCALC", "TRIG", "CHOLHDL", "LDLDIRECT" in the last 72 hours. Thyroid  Function Tests: No results for input(s): "TSH", "T4TOTAL", "FREET4", "T3FREE", "THYROIDAB" in the last 72 hours. Anemia Panel: No results for input(s): "VITAMINB12", "FOLATE", "FERRITIN", "TIBC", "IRON", "RETICCTPCT" in the last 72 hours. Sepsis Labs: No  results for input(s): "PROCALCITON", "LATICACIDVEN" in the last 168 hours.  Recent Results (from the past 240 hours)  MRSA Next Gen by PCR, Nasal     Status: None   Collection Time: 11/08/23  5:17 PM   Specimen: Nasal Mucosa; Nasal Swab  Result Value Ref Range Status   MRSA by PCR Next Gen NOT DETECTED NOT DETECTED Final    Comment: (NOTE) The GeneXpert MRSA Assay (FDA approved for NASAL specimens only), is one component of a comprehensive MRSA colonization surveillance program. It is not intended to diagnose MRSA infection nor to guide or monitor treatment for MRSA infections. Test performance is not FDA approved in patients less than 32 years old. Performed at [facility], [address], [place], [place] [address]   Culture, body fluid w Gram Stain-bottle     Status: None   Collection Time: 11/10/23  8:25 AM   Specimen: Peritoneal Washings  Result Value Ref Range Status   Specimen Description PERITONEAL  Final   Special Requests NONE  Final   Culture   Final    NO GROWTH 5 DAYS Performed at [facility], [address], [place], [place] [address]    Report Status 11/15/2023 FINAL  Final  Gram stain     Status: None   Collection Time: 11/10/23  8:30 AM   Specimen: Peritoneal Washings  Result Value Ref Range Status   Specimen Description PERITONEAL  Final   Special Requests NONE  Final   Gram Stain   Final    CYTOSPIN SMEAR WBC SEEN NO ORGANISMS SEEN Performed at [facility], [address] [place], [place] [address]    Report Status 11/10/2023 FINAL  Final     Radiology Studies: No results found.  Scheduled Meds:  atorvastatin   10 mg Oral QHS   furosemide   40 mg Oral Daily   gabapentin   400 mg Oral Daily   And   gabapentin   800 mg Oral QHS   liver oil-zinc  oxide   Topical TID   midodrine   15 mg Oral TID WC   pantoprazole   40 mg Oral BID   sodium chloride  flush  3 mL Intravenous Q12H   spironolactone   100 mg Oral Daily   sucralfate   1 g  Oral TID WC & HS   traZODone   50 mg Oral QHS   Continuous Infusions:  LOS: 9 days   Modena Andes, MD [facility]  11/17/2023, 11:57 AM   *Please note that this is a verbal dictation therefore any spelling or grammatical errors are due to the "Dragon Medical One" system interpretation.  Please page via Amion and do not message via secure chat for urgent patient care matters. Secure chat can be used for non urgent patient care matters.  How to contact the [facility] Attending or Consulting provider 7A - 7P or covering provider during after hours 7P -7A, for this patient?  Check the care team in [facility] and look for a) attending/consulting TR[facility] provider listed and b) the TR[facility] team listed. Page or secure chat 7A-7P. Log into [email or web address] and use [place]'s universal password to access. If you do not have the password, please contact the hospital operator. Locate the [facility] provider you are looking for under [facility] and page to a number that you can be directly reached. If you still have difficulty reaching the provider, please page the [facility] (Director on Call) for the Hospitalists listed on amion for assistance.

## 2023-11-17 NOTE — TOC Progression Note (Signed)
 Transition of Care [facility]) - Progression Note    Patient Details  Name: Sierra Logan MRN: [id number] Date of Birth: Nov 15, 1955  Transition of [facility]) CM/SW Contact  Juliane Och, LCSW Phone Number: 11/17/2023, 3:35 PM  Clinical Narrative:     3:35 PM Per hospitalist, patient is expected to discharge to [facility] tomorrow.  Expected Discharge Plan: Skilled Nursing Facility Barriers to Discharge: Continued Medical Work up, English as a second language teacher, SNF Pending bed offer  Expected Discharge Plan and Services In-house Referral: Clinical Social Work   Post Acute Care Choice: Skilled Nursing Facility Living arrangements for the past 2 months: Single Family Home                                       Social Determinants of Health (SDOH) Interventions SDOH Screenings   Food Insecurity: No Food Insecurity (11/08/2023)  Housing: Low Risk  (11/08/2023)  Transportation Needs: No Transportation Needs (11/08/2023)  Utilities: Not At Risk (11/08/2023)  Depression (PHQ2-9): Low Risk  (10/27/2023)  Social Connections: Moderately Isolated (11/08/2023)  Tobacco Use: High Risk (11/09/2023)    Readmission Risk Interventions     No data to display

## 2023-11-17 NOTE — Progress Notes (Signed)
 Mobility Specialist Progress Note;   11/17/23 0958  Mobility  Activity  (Chair level exercises)  Range of Motion/Exercises Active Assistive;Right leg;Left leg;Right arm;Left arm  Activity Response Tolerated fair  Mobility Referral Yes  Mobility visit 1 Mobility  Mobility Specialist Start Time (ACUTE ONLY) [id number]  Mobility Specialist Stop Time (ACUTE ONLY) 1006  Mobility Specialist Time Calculation (min) (ACUTE ONLY) 8 min    Post-mobility: BP 104/49 (66)  Pt agreeable to mobility. Limited to working on chair level exercises pt can participate in while sitting up during the day. Actively assisted pt in BUE and bLE exercises. BP taken after (see above). VSS on 2LO2. Pt left in chair with all needs met.   Janit Meline Mobility Specialist Please contact via SecureChat or [employer] [phone]

## 2023-11-17 NOTE — Plan of Care (Signed)

## 2023-11-17 NOTE — Consult Note (Signed)
 Daily Progress Note  DOA: 11/07/2023 Hospital Day: 11   Chief Complaint: Decompensated PBC cirrhosis  ASSESSMENT    68 y.o. year old female with a medical history including but not limited to DM, obesity, PBC cirrhosis, GAVE.  Admitted with decompensated cirrhosis.     Decompensated PBC cirrhosis Ascites /edema Encephalopathy Small varices / portal hypertensive gastropathy No SBP on 1.8 L LVP.  Diuretics increased yesterday Today:  Creatinine stable at 0.68.  Feels okay. I+O showing she is +2.4 L positive fluid balance since admission   FOBT+  Anemia  GAVE S/p APC ablation Today:  Hgb stable on yesterday's labs >> 9.2   Hypotension On midodrine .     PLAN   --Continue lasix  100 mg / aldactone  40 mg daily --Continue low sodium diet  --With increased diuretics we will need to watch renal function carefully, as well as BP since it has been running low.  --Okay to delay retrial of beta blocker to outpt setting. --She will need to re-establish care with [facility] given her long absence form outpt monitoring --Defer need for URSO  until outpatient appointment --Continue PPI   Subjective   No complaints. Still a little tired.   Objective    Recent Labs    11/16/23 0748  WBC 5.6  HGB 9.2*  HCT 30.3*  MCV 85.4  PLT 143*   No results for input(s): "FOLATE", "VITAMINB12", "FERRITIN", "TIBC", "IRONPCTSAT" in the last 72 hours. Recent Labs    11/15/23 1527 11/16/23 0748 11/17/23 0937  NA 135 135 134*  K 4.2 4.0 3.9  CL 102 102 99  CO2 25 25 26   GLUCOSE 84 99 91  BUN 9 9 11   CREATININE 0.71 0.71 0.68  CALCIUM  8.0* 8.0* 8.5*   Recent Labs    11/16/23 0748  PROT 6.2*  ALBUMIN  2.2*  AST 28  ALT 16  ALKPHOS 71  BILITOT 0.7    Imaging:  IR Paracentesis INDICATION: Patient with history of decompensated cirrhosis, ascites. IR consulted for diagnostic and therapeutic paracentesis with 4L max.  EXAM: ULTRASOUND GUIDED DIAGNOSTIC AND  THERAPEUTIC PARACENTESIS  MEDICATIONS: 10 mL 1% lidocaine   COMPLICATIONS: None immediate.  PROCEDURE: Informed written consent was obtained from the patient after a discussion of the risks, benefits and alternatives to treatment. A timeout was performed prior to the initiation of the procedure.  Initial ultrasound scanning demonstrates a moderate amount of ascites within the right lower abdominal quadrant. The right lower abdomen was prepped and draped in the usual sterile fashion. 1% lidocaine  was used for local anesthesia.  Following this, a 19 gauge, 7-cm, Yueh catheter was introduced. An ultrasound image was saved for documentation purposes. The paracentesis was performed. The catheter was removed and a dressing was applied. The patient tolerated the procedure well without immediate post procedural complication.  FINDINGS: A total of approximately 1.85 liters of turbid yellow fluid was removed. Samples were sent to the laboratory as requested by the clinical team.  IMPRESSION: Successful ultrasound-guided paracentesis yielding 1.85 liters of peritoneal fluid.  Performed by: Wyatt Pommier, PA-C  Electronically Signed   By: Myrlene Asper D.O.   On: 11/10/2023 09:04     Scheduled inpatient medications:   atorvastatin   10 mg Oral QHS   furosemide   40 mg Oral Daily   gabapentin   400 mg Oral Daily   And   gabapentin   800 mg Oral QHS   liver oil-zinc  oxide   Topical TID   midodrine   15 mg Oral  TID WC   pantoprazole   40 mg Oral BID   sodium chloride  flush  3 mL Intravenous Q12H   spironolactone   100 mg Oral Daily   sucralfate   1 g Oral TID WC & HS   traZODone   50 mg Oral QHS   Continuous inpatient infusions:   albumin  human Stopped (11/17/23 1451)   PRN inpatient medications: acetaminophen  **OR** acetaminophen , albuterol , melatonin, ondansetron  (ZOFRAN ) IV, mouth rinse  Vital signs in last 24 hours: Temp:  [98.3 F (36.8 C)-98.8 F (37.1 C)] 98.5 F (36.9  C) (05/01 1117) Pulse Rate:  [80-98] 84 (05/01 1117) Resp:  [18-21] 18 (05/01 1117) BP: (91-108)/(41-55) 93/45 (05/01 1117) SpO2:  [87 %-95 %] 92 % (05/01 1117) Weight:  [81.4 kg] 81.4 kg (05/01 0552) Last BM Date : 11/17/23  Intake/Output Summary (Last 24 hours) at 11/17/2023 1543 Last data filed at 11/17/2023 1527 Gross per 24 hour  Intake 589.4 ml  Output 1100 ml  Net -510.6 ml    Intake/Output from previous day: 04/30 0701 - 05/01 0700 In: 620.7 [P.O.:480; IV Piggyback:140.7] Out: 500 [Urine:500] Intake/Output this shift: Total I/O In: 589.4 [P.O.:480; I.V.:10; IV Piggyback:99.4] Out: 800 [Urine:800]   Physical Exam:  General: Alert female in NAD Heart:  Regular rate .  Pulmonary: Normal respiratory effort Abdomen: Soft, nondistended, nontender. Normal bowel sounds. Extremities:  Pitting edema of BLE.   Neurologic: Alert and oriented Psych: Pleasant. Cooperative     LOS: 9 days   Mai Schwalbe ,NP 11/17/2023, 3:43 PM

## 2023-11-17 NOTE — Progress Notes (Signed)
 Verified with MD about holding lasix  and spirolactone due to BP. MD stated to give both medications and do orthostatic vitals. Orthostatic vitals done and charted. Medications given.

## 2023-11-17 NOTE — Progress Notes (Signed)
 Unna boots taken off per patient request.

## 2023-11-18 ENCOUNTER — Inpatient Hospital Stay (HOSPITAL_COMMUNITY)

## 2023-11-18 LAB — COMPREHENSIVE METABOLIC PANEL WITH GFR
ALT: 15 U/L (ref 0–44)
AST: 26 U/L (ref 15–41)
Albumin: 3.4 g/dL — ABNORMAL LOW (ref 3.5–5.0)
Alkaline Phosphatase: 58 U/L (ref 38–126)
Anion gap: 7 (ref 5–15)
BUN: 12 mg/dL (ref 8–23)
CO2: 27 mmol/L (ref 22–32)
Calcium: 8.5 mg/dL — ABNORMAL LOW (ref 8.9–10.3)
Chloride: 99 mmol/L (ref 98–111)
Creatinine, Ser: 0.58 mg/dL (ref 0.44–1.00)
GFR, Estimated: >60 mL/min (ref >60)
Glucose, Bld: 95 mg/dL (ref 70–99)
Potassium: 3.7 mmol/L (ref 3.5–5.1)
Sodium: 133 mmol/L — ABNORMAL LOW (ref 135–145)
Total Bilirubin: 1 mg/dL (ref 0.0–1.2)
Total Protein: 6.7 g/dL (ref 6.5–8.1)

## 2023-11-18 LAB — CBC WITH DIFFERENTIAL/PLATELET
Abs Immature Granulocytes: 0.02 10*3/uL (ref 0.00–0.07)
Abs Immature Granulocytes: 0.02 10*3/uL (ref 0.00–0.07)
Basophils Absolute: 0 10*3/uL (ref 0.0–0.1)
Basophils Absolute: 0.1 10*3/uL (ref 0.0–0.1)
Basophils Relative: 1 %
Basophils Relative: 1 %
Eosinophils Absolute: 0 10*3/uL (ref 0.0–0.5)
Eosinophils Absolute: 0 10*3/uL (ref 0.0–0.5)
Eosinophils Relative: 0 %
Eosinophils Relative: 0 %
HCT: 24.3 % — ABNORMAL LOW (ref 36.0–46.0)
HCT: 26.8 % — ABNORMAL LOW (ref 36.0–46.0)
Hemoglobin: 7.4 g/dL — ABNORMAL LOW (ref 12.0–15.0)
Hemoglobin: 7.9 g/dL — ABNORMAL LOW (ref 12.0–15.0)
Immature Granulocytes: 0 %
Immature Granulocytes: 0 %
Lymphocytes Relative: 14 %
Lymphocytes Relative: 15 %
Lymphs Abs: 0.9 10*3/uL (ref 0.7–4.0)
Lymphs Abs: 1 10*3/uL (ref 0.7–4.0)
MCH: 25.7 pg — ABNORMAL LOW (ref 26.0–34.0)
MCH: 25.5 pg — ABNORMAL LOW (ref 26.0–34.0)
MCHC: 30.5 g/dL (ref 30.0–36.0)
MCHC: 29.5 g/dL — ABNORMAL LOW (ref 30.0–36.0)
MCV: 84.4 fL (ref 80.0–100.0)
MCV: 86.5 fL (ref 80.0–100.0)
Monocytes Absolute: 0.7 10*3/uL (ref 0.1–1.0)
Monocytes Absolute: 0.9 10*3/uL (ref 0.1–1.0)
Monocytes Relative: 12 %
Monocytes Relative: 13 %
Neutro Abs: 4.7 10*3/uL (ref 1.7–7.7)
Neutro Abs: 5 10*3/uL (ref 1.7–7.7)
Neutrophils Relative %: 73 %
Neutrophils Relative %: 71 %
Platelets: 120 10*3/uL — ABNORMAL LOW (ref 150–400)
Platelets: 145 10*3/uL — ABNORMAL LOW (ref 150–400)
RBC: 2.88 MIL/uL — ABNORMAL LOW (ref 3.87–5.11)
RBC: 3.1 MIL/uL — ABNORMAL LOW (ref 3.87–5.11)
RDW: 15.4 % (ref 11.5–15.5)
RDW: 15.8 % — ABNORMAL HIGH (ref 11.5–15.5)
WBC: 6.4 10*3/uL (ref 4.0–10.5)
WBC: 7 10*3/uL (ref 4.0–10.5)
nRBC: 0 % (ref 0.0–0.2)
nRBC: 0 % (ref 0.0–0.2)

## 2023-11-18 LAB — TYPE AND SCREEN

## 2023-11-18 LAB — HEMOGLOBIN AND HEMATOCRIT, BLOOD
HCT: 23.1 % — ABNORMAL LOW (ref 36.0–46.0)
Hemoglobin: 7.2 g/dL — ABNORMAL LOW (ref 12.0–15.0)

## 2023-11-18 MED ORDER — PANTOPRAZOLE SODIUM 40 MG PO TBEC: 40.0000 mg | DELAYED_RELEASE_TABLET | Freq: Two times a day (BID) | ORAL | 0 refills | Status: DC

## 2023-11-18 MED ORDER — FUROSEMIDE 40 MG PO TABS: 40.0000 mg | ORAL_TABLET | Freq: Every day | ORAL | 0 refills | Status: DC

## 2023-11-18 MED ORDER — MIDODRINE HCL 5 MG PO TABS: 15.0000 mg | ORAL_TABLET | Freq: Three times a day (TID) | ORAL | 0 refills | Status: DC

## 2023-11-18 MED ORDER — SPIRONOLACTONE 100 MG PO TABS: 100.0000 mg | ORAL_TABLET | Freq: Every day | ORAL | 0 refills | Status: DC

## 2023-11-18 NOTE — Progress Notes (Signed)
 Physical Therapy Treatment Patient Details Name: Sierra Logan MRN: [id number] DOB: 04/12/1956 Today's Date: 11/18/2023   History of Present Illness 68 year old presented 11/07/23 with swelling of legs, feet, stomach and black stool. Chest x-ray with moderate severity left basilar atelectasis or infiltrate, moderate-sized left pleural effusion. +anasarca; +GI bleed; 4/23 EGD with esophageal varices; 4/24 paracentesis; persistent hypotension  PMH  hypertension, hyperlipidemia, type 2 diabetes, history of cirrhosis with esophageal varices    PT Comments  Pt up on BSC on arrival and agreeable to session. Pt BP soft but stable throughout session with pt endorsing mild c/o dizziness during standing activity, resolving with seated rest. Pt completing x5 serial sit<>stand prior to gait trials, and able to demonstrate x2 gait bouts with no AD and RW for support with up to min A to maintain balance. SpO2 to 85% on RA with ambulation, replaced 3L with SpO2 recovering to 95% in <30seconds. Educated pt on importance of time up OOB for improved tolerance of upright activity and BP compliance with pt verbalizing understanding. Pt continues to benefit from skilled PT services to progress toward functional mobility goals.      If plan is discharge home, recommend the following: A little help with walking and/or transfers;A little help with bathing/dressing/bathroom;Assistance with cooking/housework;Assist for transportation;Help with stairs or ramp for entrance   Can travel by private vehicle     No  Equipment Recommendations  Other (comment) (TBD; pt reports house too small for use of RW)    Recommendations for Other Services       Precautions / Restrictions Precautions Precautions: Fall Recall of Precautions/Restrictions: Intact Restrictions Weight Bearing Restrictions Per Provider Order: No     Mobility  Bed Mobility Overal bed mobility: Needs Assistance Bed Mobility: Sit to Supine       Sit  to supine: Contact guard assist   General bed mobility comments: pt up on BSC on arrival; returned to bed at end of session    Transfers Overall transfer level: Needs assistance Equipment used: None Transfers: Sit to/from Stand, Bed to chair/wheelchair/BSC Sit to Stand: Contact guard assist   Step pivot transfers: Min assist       General transfer comment: CGA for saftey on rise from [facility], minA to balance on step pivot    Ambulation/Gait Ambulation/Gait assistance: Contact guard assist, Min assist Gait Distance (Feet): 48 Feet (+51ft, seated rest between for BP) Assistive device: None, Rolling walker (2 wheels) Gait Pattern/deviations: Step-through pattern, Decreased stride length, Shuffle Gait velocity: decr     General Gait Details: Pt with mild unsteadiness without AD needing minA on first bout of gait, RW with second bout, pt with improved balance and needing CGA for saftey, pt with mild c/o dizziness, BP stable   Stairs             Wheelchair Mobility     Tilt Bed    Modified Rankin (Stroke Patients Only)       Balance Overall balance assessment: Needs assistance Sitting-balance support: No upper extremity supported, Feet supported Sitting balance-Leahy Scale: Good     Standing balance support: No upper extremity supported Standing balance-Leahy Scale: Fair Standing balance comment: pt benefited from RW support with gait due to pt mildly unsteady without AD                            Communication Communication Communication: Impaired Factors Affecting Communication: Hearing impaired  Cognition Arousal: Alert Behavior During Therapy: [facility]  for tasks assessed/performed   PT - Cognitive impairments: No apparent impairments                         Following commands: Intact      Cueing Cueing Techniques: Verbal cues  Exercises Other Exercises Other Exercises: sit to stand x 5 reps prior to ambulation with BP at  99/49(63)    General Comments General comments (skin integrity, edema, etc.): BP at 102/36(55) in supine, 99/49(63) after x5 sit<>stand,102/44(62) after first gait bout, 107/48(66) after second      Pertinent Vitals/Pain Pain Assessment Pain Assessment: No/denies pain Pain Intervention(s): Monitored during session, Limited activity within patient's tolerance    Home Living                          Prior Function            PT Goals (current goals can now be found in the care plan section) Acute Rehab PT Goals PT Goal Formulation: With patient Time For Goal Achievement: 11/24/23 Progress towards PT goals: Progressing toward goals    Frequency    Min 2X/week      PT Plan      Co-evaluation              AM-PAC PT "6 Clicks" Mobility   Outcome Measure  Help needed turning from your back to your side while in a flat bed without using bedrails?: None Help needed moving from lying on your back to sitting on the side of a flat bed without using bedrails?: A Little Help needed moving to and from a bed to a chair (including a wheelchair)?: A Little Help needed standing up from a chair using your arms (e.g., wheelchair or bedside chair)?: A Little Help needed to walk in hospital room?: A Little Help needed climbing 3-5 steps with a railing? : Total 6 Click Score: 17    End of Session Equipment Utilized During Treatment: Gait belt Activity Tolerance: Treatment limited secondary to medical complications (Comment) (limited by hypotension/dizziness) Patient left: in bed;with call bell/phone within reach;with bed alarm set;with nursing/sitter in room (Lab in room) Nurse Communication: Mobility status;Other (comment) (BP) PT Visit Diagnosis: Other abnormalities of gait and mobility (R26.89);Repeated falls (R29.6);Muscle weakness (generalized) (M62.81);Dizziness and giddiness (R42)     Time: 2952-8413 PT Time Calculation (min) (ACUTE ONLY): 23 min  Charges:     $Gait Training: 8-22 mins $Therapeutic Activity: 8-22 mins PT General Charges $$ ACUTE PT VISIT: 1 Visit                     Dusty Raczkowski R. PTA Acute Rehabilitation Services Office: [phone]   Agapito Horseman 11/18/2023, 3:00 PM

## 2023-11-18 NOTE — Care Management Important Message (Signed)
 Important Message  Patient Details  Name: Sierra Logan MRN: [id number] Date of Birth: 1956-02-26   Important Message Given:  Yes - Medicare IM     Janith Melnick 11/18/2023, 10:31 AM

## 2023-11-18 NOTE — Discharge Summary (Incomplete)
 Physician Discharge Summary  Sierra Logan OZH:[id number] DOB: 02/13/1956 DOA: 11/07/2023  PCP: Adelia Homestead, MD  Admit date: 11/07/2023 Discharge date: 11/18/2023 30 Day Unplanned Readmission Risk Score    Flowsheet Row ED to Hosp-Admission (Current) from 11/07/2023 in [facility] 2C CV PROGRESSIVE CARE  30 Day Unplanned Readmission Risk Score (%) 15.56 Filed at 11/18/2023 0801       This score is the patient's risk of an unplanned readmission within 30 days of being discharged (0 -100%). The score is based on dignosis, age, lab data, medications, orders, and past utilization.   Low:  0-14.9   Medium: 15-21.9   High: 22-29.9   Extreme: 30 and above          Admitted From: Home Disposition: SNF  Recommendations for Outpatient Follow-up:  Follow up with PCP in 1-2 weeks Please obtain BMP/CBC in one week Follow-up with GI in 2 to 3 weeks and repeat EGD in 2 to 3 weeks Please follow up with your PCP on the following pending results: Unresulted Labs (From admission, onward)     Start     Ordered   11/18/23 0833  Hemoglobin and hematocrit, blood  ONCE - STAT,   STAT       Question:  Specimen collection method  Answer:  Lab=Lab collect   11/18/23 0832              Home Health: None Equipment/Devices: None  Discharge Condition: Stable CODE STATUS: Full code Diet recommendation: Cardiac  Subjective: Patient seen and examined.  Complains of mild shortness of breath but appears comfortable.  No other complaint.  Brief/Interim Summary: 68 year old with history of hypertension, hyperlipidemia, type 2 diabetes, history of cirrhosis with esophageal varices presented with swelling of legs and feet, melanotic black stool.  Ongoing for about a month.  In the emergency room patient was relatively stable.  Hemoglobin 10.3 with recent hemoglobin of 11.3.  Chest x-ray with moderate severity left basilar atelectasis or infiltrate, moderate-sized left pleural effusion.  FOBT  positive.  Patient was admitted with anasarca secondary to liver disease and multiple other complex issues, details below.   Fluid overload secondary to decompensated PBC cirrhosis and anasarca/orthostatic hypotension: - known history of liver cirrhosis.  Still has significant leg swelling, wrapped. - on Lasix , spironolactone , - due to ongoing orthostatic hypotension, placed on midodrine  15 mg 3 times daily - 2D echo showed EF of 60 to 65%, no regional WMA, pleural effusion on the left side. - paracentesis on 4/24, 1.8 L transudate removed.  Negative for SBP.  Antibiotics discontinued -Continue unna boot for the legs.  Not feeling dizzy anymore and she is not orthostatic positive anymore.  Tolerating current diuretics.  GI signed off with recommendations to continue current medications and they will follow-up as outpatient.   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  Gastric antral vascular ectasia with bleeding was present.  Cauterized.   -Continue oral Protonix  twice daily, completed 7 days of Carafate . -Outpatient follow-up with GI, will need repeat EGD in 1 to 3 months -Per GI, small varices, did not tolerate Coreg  well due to significant fatigue so this was deferred for now.  They will reassess outpatient for beta-blocker.   Small to moderate left pleural effusion, abnormal chest x-ray  - Chest x-ray 4/21 showed left basilar atelectasis  and/or infiltrate, small to moderate size left pleural effusion.  She is complaining of shortness of breath, faint crackles at the bases, ***   Diabetes mellitus type 2, controlled with - Hemoglobin A1c 4.8, not on any meds     Hyperlipidemia: - On a statin.  LFTs are normal.   History of hepatic encephalopathy - Currently stable, alert and oriented   Obesity class I Estimated body mass index  is 32.38 kg/m as calculated from the following:   Height as of this encounter: 5\' 2"  (1.575 m).   Weight as of this encounter: 80.3 kg.  Discharge plan was discussed with patient and/or family member and they verbalized understanding and agreed with it.  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   Specialty: Internal Medicine Contact information: [address] [place] [address] [phone]                No Known Allergies  Consultations: ***   Procedures/Studies: IR Paracentesis Result Date: 11/10/2023 INDICATION: Patient with history of decompensated cirrhosis, ascites. IR consulted for diagnostic and therapeutic paracentesis with 4L max. EXAM: ULTRASOUND GUIDED DIAGNOSTIC AND THERAPEUTIC PARACENTESIS MEDICATIONS: 10 mL 1% lidocaine  COMPLICATIONS: None immediate. PROCEDURE: Informed written consent was obtained from the patient after a discussion of the risks, benefits and alternatives to treatment. A timeout was performed prior to the initiation of the procedure. Initial ultrasound scanning demonstrates a moderate amount of ascites within the right lower abdominal quadrant. The right lower abdomen was prepped and draped in the usual sterile fashion. 1% lidocaine  was used for local anesthesia. Following this, a 19 gauge, 7-cm,  Yueh catheter was introduced. An ultrasound image was saved for documentation purposes. The paracentesis was performed. The catheter was removed and a dressing was applied. The patient tolerated the procedure well without immediate post procedural complication. FINDINGS: A total of approximately 1.85 liters of turbid yellow fluid was removed. Samples were sent to the laboratory as requested by the clinical team. IMPRESSION: Successful ultrasound-guided paracentesis yielding 1.85 liters of peritoneal fluid. Performed by: Wyatt Pommier, PA-C Electronically Signed   By: Myrlene Asper D.O.   On: 11/10/2023 09:04   ECHOCARDIOGRAM COMPLETE Result Date: 11/09/2023    ECHOCARDIOGRAM REPORT   Patient Name:   BELANNA VESTAL Date of Exam: 11/09/2023 Medical Rec #:  [id number]      Height:       62.0 in Accession #:    [id number]     Weight:       187.1 lb Date of Birth:  1956-03-13      BSA:          1.858 m Patient Age:    67 years       BP:           106/66 mmHg  Patient Gender: F              HR:           91 bpm. Exam Location:  Inpatient Procedure: 2D Echo, Cardiac Doppler and Color Doppler (Both Spectral and Color            Flow Doppler were utilized during procedure). Indications:    CHF I50.31  History:        Patient has prior history of Echocardiogram examinations, most                 recent 10/17/2020. CHF; Risk Factors:Hypertension, Diabetes,                 Dyslipidemia and Current Smoker.  Sonographer:    Adelia Homestead RVT RCS Referring Phys: [id number] Roxana Copier  Sonographer Comments: Technically difficult study due to poor echo windows. IMPRESSIONS  1. Left ventricular ejection fraction, by estimation, is 60 to 65%. The left ventricle has normal function. The left ventricle has no regional wall motion abnormalities. Left ventricular diastolic parameters were normal.  2. Right ventricular systolic function is normal. The right ventricular size is normal. Tricuspid regurgitation signal is inadequate for  assessing PA pressure.  3. A small pericardial effusion is present. The pericardial effusion is circumferential. Large pleural effusion in the left lateral region.  4. The mitral valve is degenerative. No evidence of mitral valve regurgitation. No evidence of mitral stenosis. Severe mitral annular calcification.  5. The aortic valve has an indeterminant number of cusps. There is moderate calcification of the aortic valve. There is moderate thickening of the aortic valve. Aortic valve regurgitation is mild. Aortic valve sclerosis/calcification is present, without  any evidence of aortic stenosis. Aortic valve area, by VTI measures 2.13 cm. Aortic valve mean gradient measures 4.0 mmHg. Aortic valve Vmax measures 1.42 m/s.  6. The inferior vena cava is dilated in size with >50% respiratory variability, suggesting right atrial pressure of 8 mmHg. FINDINGS  Left Ventricle: Left ventricular ejection fraction, by estimation, is 60 to 65%. The left ventricle has normal function. The left ventricle has no regional wall motion abnormalities. The left ventricular internal cavity size was normal in size. There is  no left ventricular hypertrophy. Left ventricular diastolic parameters were normal. Indeterminate filling pressures. Right Ventricle: The right ventricular size is normal. No increase in right ventricular wall thickness. Right ventricular systolic function is normal. Tricuspid regurgitation signal is inadequate for assessing PA pressure. Left Atrium: Left atrial size was normal in size. Right Atrium: Right atrial size was normal in size. Pericardium: A small pericardial effusion is present. The pericardial effusion is circumferential. Mitral Valve: The mitral valve is degenerative in appearance. There is mild calcification of the mitral valve leaflet(s). Severe mitral annular calcification. No evidence of mitral valve regurgitation. No evidence of mitral valve stenosis. Tricuspid Valve: The tricuspid valve is normal  in structure. Tricuspid valve regurgitation is not demonstrated. No evidence of tricuspid stenosis. Aortic Valve: The aortic valve has an indeterminant number of cusps. There is moderate calcification of the aortic valve. There is moderate thickening of the aortic valve. Aortic valve regurgitation is mild. Aortic valve sclerosis/calcification is present, without any evidence of aortic stenosis. Aortic valve mean gradient measures 4.0 mmHg. Aortic valve peak gradient measures 8.0 mmHg. Aortic valve area, by VTI measures 2.13 cm. Pulmonic Valve: The pulmonic valve was not well visualized. Pulmonic valve regurgitation is not visualized. No evidence of pulmonic stenosis. Aorta: The aortic root is  normal in size and structure. Venous: The inferior vena cava is dilated in size with greater than 50% respiratory variability, suggesting right atrial pressure of 8 mmHg. IAS/Shunts: No atrial level shunt detected by color flow Doppler. Additional Comments: There is a large pleural effusion in the left lateral region.  LEFT VENTRICLE PLAX 2D LVIDd:         5.20 cm   Diastology LVIDs:         2.70 cm   LV e' medial:    8.27 cm/s LV PW:         0.70 cm   LV E/e' medial:  16.1 LV IVS:        1.10 cm   LV e' lateral:   6.74 cm/s LVOT diam:     1.60 cm   LV E/e' lateral: 19.7 LV SV:         58 LV SV Index:   31 LVOT Area:     2.01 cm  RIGHT VENTRICLE             IVC RV Basal diam:  3.20 cm     IVC diam: 2.20 cm RV S prime:     11.90 cm/s LEFT ATRIUM             Index        RIGHT ATRIUM          Index LA Vol (A2C):   44.1 ml 23.73 ml/m  RA Area:     8.50 cm LA Vol (A4C):   32.5 ml 17.49 ml/m  RA Volume:   15.30 ml 8.23 ml/m LA Biplane Vol: 38.6 ml 20.77 ml/m  AORTIC VALVE                    PULMONIC VALVE AV Area (Vmax):    2.02 cm     PV Vmax:       0.70 m/s AV Area (Vmean):   2.10 cm     PV Peak grad:  1.9 mmHg AV Area (VTI):     2.13 cm AV Vmax:           141.50 cm/s AV Vmean:          94.250 cm/s AV VTI:             0.274 m AV Peak Grad:      8.0 mmHg AV Mean Grad:      4.0 mmHg LVOT Vmax:         142.00 cm/s LVOT Vmean:        98.500 cm/s LVOT VTI:          0.290 m LVOT/AV VTI ratio: 1.06  AORTA Ao Root diam: 2.00 cm MITRAL VALVE MV Area (PHT): 4.17 cm     SHUNTS MV Decel Time: 182 msec     Systemic VTI:  0.29 m MV E velocity: 133.00 cm/s  Systemic Diam: 1.60 cm MV A velocity: 128.00 cm/s MV E/A ratio:  1.04 Gaylyn Keas MD Electronically signed by Gaylyn Keas MD Signature Date/Time: 11/09/2023/1:06:15 PM    Final    US  Abdomen Complete Result Date: 11/08/2023 CLINICAL DATA:  Cirrhosis of liver with ascites. EXAM: ABDOMEN ULTRASOUND COMPLETE COMPARISON:  Ultrasound abdomen 03/20/2013. CT abdomen and pelvis 2102 chest CT CT a FINDINGS: Gallbladder: Gallstones are present measuring 9 mm. The gallbladder is distended. Gallbladder wall measures up to 3 mm. Negative sonographic Murphy sign. Common bile duct: Diameter: 4.9 mm. Liver: No focal lesion identified. Lobulated contour.  Increased echogenicity throughout. Portal vein is patent on color Doppler imaging with normal direction of blood flow towards the liver. IVC: No abnormality visualized. Pancreas: Visualized portion unremarkable. Spleen: Mildly enlarged. Right Kidney: Length: 10.3. Echogenicity within normal limits. No mass or hydronephrosis visualized. Left Kidney: Length: 11.4. Echogenicity within normal limits. No mass or hydronephrosis visualized. Abdominal aorta: Not well seen secondary to overlying bowel gas. Other findings: Ascites is seen in the 4 abdominal quadrants. There is a 2.0 x 0.9 x 1.4 cm lymph node adjacent to the pancreatic head. IMPRESSION: 1. Cirrhotic liver with ascites and mild splenomegaly. 2. Cholelithiasis with mild gallbladder wall thickening. Negative sonographic Murphy sign. 3. Enlarged lymph node adjacent to the pancreatic head. Electronically Signed   By: Tyron Gallon M.D.   On: 11/08/2023 16:26   DG Chest 2 View Result Date:  11/07/2023 CLINICAL DATA:  Shortness of breath. EXAM: CHEST - 2 VIEW COMPARISON:  August 27, 2020 FINDINGS: The heart size and mediastinal contours are within normal limits. There is marked severity calcification of the aortic arch. Mild, diffuse, chronic appearing increased interstitial lung markings are seen. There is opacification of the left lung base. A small to moderate size left pleural effusion is also noted. No pneumothorax is identified. The visualized skeletal structures are unremarkable. IMPRESSION: 1. Findings likely consistent with marked severity left basilar atelectasis and/or infiltrate. Follow-up with nonemergent chest CT is recommended, as sequelae associated with an underlying neoplastic process cannot be excluded. 2. Small to moderate sized left pleural effusion. Electronically Signed   By: Virgle Grime M.D.   On: 11/07/2023 19:54     Discharge Exam: Vitals:   11/18/23 0408 11/18/23 0800  BP: (!) 101/52 (!) 123/52  Pulse: 100 99  Resp: 18 20  Temp: 99.4 F (37.4 C) 99 F (37.2 C)  SpO2: 91% 91%   Vitals:   11/17/23 1955 11/17/23 2244 11/18/23 0408 11/18/23 0800  BP: (!) 110/57 (!) 126/58 (!) 101/52 (!) 123/52  Pulse: 86 100 100 99  Resp: 20 19 18 20   Temp: 98.4 F (36.9 C) 99 F (37.2 C) 99.4 F (37.4 C) 99 F (37.2 C)  TempSrc: Oral Oral Oral Oral  SpO2: 91% 92% 91% 91%  Weight:   80.3 kg   Height:        General: Pt is alert, awake, not in acute distress Cardiovascular: RRR, S1/S2 +, no rubs, no gallops Respiratory: CTA bilaterally, no wheezing, no rhonchi Abdominal: Soft, NT, ND, bowel sounds + Extremities: no edema, no cyanosis    The results of significant diagnostics from this hospitalization (including imaging, microbiology, ancillary and laboratory) are listed below for reference.     Microbiology: Recent Results (from the past 240 hours)  MRSA Next Gen by PCR, Nasal     Status: None   Collection Time: 11/08/23  5:17 PM   Specimen:  Nasal Mucosa; Nasal Swab  Result Value Ref Range Status   MRSA by PCR Next Gen NOT DETECTED NOT DETECTED Final    Comment: (NOTE) The GeneXpert MRSA Assay (FDA approved for NASAL specimens only), is one component of a comprehensive MRSA colonization surveillance program. It is not intended to diagnose MRSA infection nor to guide or monitor treatment for MRSA infections. Test performance is not FDA approved in patients less than 93 years old. Performed at [facility], [address] [place], [place] [address]   Culture, body fluid w Gram Stain-bottle     Status: None   Collection Time: 11/10/23  8:25 AM   Specimen: Peritoneal Washings  Result Value Ref Range Status   Specimen Description PERITONEAL  Final   Special Requests NONE  Final   Culture   Final    NO GROWTH 5 DAYS Performed at [facility], [address] [place], [place] [address]    Report Status 11/15/2023 FINAL  Final  Gram stain     Status: None   Collection Time: 11/10/23  8:30 AM   Specimen: Peritoneal Washings  Result Value Ref Range Status   Specimen Description PERITONEAL  Final   Special Requests NONE  Final   Gram Stain   Final    CYTOSPIN SMEAR WBC SEEN NO ORGANISMS SEEN Performed at [facility], [address] [place], [place] [address]    Report Status 11/10/2023 FINAL  Final     Labs: BNP (last 3 results) Recent Labs    11/07/23 1607  BNP 101.4*   Basic Metabolic Panel: Recent Labs  Lab 11/13/23 0212 11/15/23 1527 11/16/23 0748 11/17/23 0937 11/18/23 0215  NA 138 135 135 134* 133*  K 4.6 4.2 4.0 3.9 3.7  CL 104 102 102 99 99  CO2 23 25 25 26 27   GLUCOSE 98 84 99 91 95  BUN 11 9 9 11 12   CREATININE 0.53 0.71 0.71 0.68 0.58  CALCIUM  8.2* 8.0* 8.0* 8.5* 8.5*   Liver Function Tests: Recent Labs  Lab 11/12/23 0218 11/13/23 0212 11/16/23 0748 11/18/23 0215  AST 26 47* 28 26  ALT 17 20 16 15   ALKPHOS 71 74 71 58  BILITOT 0.4 1.2 0.7 1.0  PROT 5.4* 6.3*  6.2* 6.7  ALBUMIN  1.7* 2.5* 2.2* 3.4*   No results for input(s): "LIPASE", "AMYLASE" in the last 168 hours. No results for input(s): "AMMONIA" in the last 168 hours. CBC: Recent Labs  Lab 11/12/23 0218 11/13/23 0212 11/16/23 0748 11/18/23 0215  WBC 5.2 7.0 5.6 6.4  NEUTROABS 3.3 4.9  --  4.7  HGB 9.0* 9.9* 9.2* 7.4*  HCT 29.3* 33.6* 30.3* 24.3*  MCV 86.7 89.1 85.4 84.4  PLT 119* 105* 143* 120*   Cardiac Enzymes: No results for input(s): "CKTOTAL", "CKMB", "CKMBINDEX", "TROPONINI" in the last 168 hours. BNP: Invalid input(s): "POCBNP" CBG: No results for input(s): "GLUCAP" in the last 168 hours. D-Dimer No results for input(s): "DDIMER" in the last 72 hours. Hgb A1c No results for input(s): "HGBA1C" in the last 72 hours. Lipid Profile No results for input(s): "CHOL", "HDL", "LDLCALC", "TRIG", "CHOLHDL", "LDLDIRECT" in the last 72 hours. Thyroid  function studies No results for input(s): "TSH", "T4TOTAL", "T3FREE", "THYROIDAB" in the last 72 hours.  Invalid input(s): "FREET3" Anemia work up No results for input(s): "VITAMINB12", "FOLATE", "FERRITIN", "TIBC", "IRON", "RETICCTPCT" in the last 72 hours. Urinalysis    Component Value Date/Time   COLORURINE YELLOW 08/29/2020 0020   APPEARANCEUR CLEAR 08/29/2020 0020   LABSPEC >1.046 (H) 08/29/2020 0020   PHURINE 6.0 08/29/2020 0020   GLUCOSEU NEGATIVE 08/29/2020 0020   HGBUR NEGATIVE 08/29/2020 0020   BILIRUBINUR NEGATIVE 08/29/2020 0020   KETONESUR NEGATIVE 08/29/2020 0020   PROTEINUR NEGATIVE 08/29/2020 0020   NITRITE NEGATIVE 08/29/2020 0020   LEUKOCYTESUR NEGATIVE 08/29/2020 0020   Sepsis Labs Recent Labs  Lab 11/12/23 0218 11/13/23 0212 11/16/23 0748 11/18/23 0215  WBC 5.2 7.0 5.6 6.4   Microbiology Recent Results (from the past 240 hours)  MRSA Next Gen by PCR, Nasal     Status: None   Collection Time: 11/08/23  5:17 PM   Specimen: Nasal Mucosa; Nasal Swab  Result Value Ref Range Status   MRSA by PCR  Next Gen NOT DETECTED NOT DETECTED Final    Comment: (NOTE) The GeneXpert MRSA Assay (FDA approved for NASAL specimens only), is one component of a comprehensive MRSA colonization surveillance program. It is not intended to diagnose MRSA infection nor to guide or monitor treatment for MRSA infections. Test performance is not FDA approved in patients less than 56 years old. Performed at [facility], [address] [place], [place] [address]   Culture, body fluid w Gram Stain-bottle     Status: None   Collection Time: 11/10/23  8:25 AM   Specimen: Peritoneal Washings  Result Value Ref Range Status   Specimen Description PERITONEAL  Final   Special Requests NONE  Final   Culture   Final    NO GROWTH 5 DAYS Performed at [facility], [address], [place], [place] [address]    Report Status 11/15/2023 FINAL  Final  Gram stain     Status: None   Collection Time: 11/10/23  8:30 AM   Specimen: Peritoneal Washings  Result Value Ref Range Status   Specimen Description PERITONEAL  Final   Special Requests NONE  Final   Gram Stain   Final    CYTOSPIN SMEAR WBC SEEN NO ORGANISMS SEEN Performed at [facility], [address] [place], [place] [address]    Report Status 11/10/2023 FINAL  Final    FURTHER DISCHARGE INSTRUCTIONS:   Get Medicines reviewed and adjusted: Please take all your medications with you for your next visit with your Primary MD   Laboratory/radiological data: Please request your Primary MD to go over all hospital tests and procedure/radiological results at the follow up, please ask your Primary MD to get all Hospital records sent to his/her office.   In some cases, they will be blood work, cultures and biopsy results pending at the time of your discharge. Please request that your primary care M.D. goes through all the records of your hospital data and follows up on these results.   Also Note the following: If you experience worsening of your  admission symptoms, develop shortness of breath, life threatening emergency, suicidal or homicidal thoughts you must seek medical attention immediately by calling 911 or calling your MD immediately  if symptoms less severe.   You must read complete instructions/literature along with all the possible adverse reactions/side effects for all the Medicines you take and that have been prescribed to you. Take any new Medicines after you have completely understood and accpet all the possible adverse reactions/side effects.    Do not drive when taking Pain medications or sleeping medications (Benzodaizepines)   Do not take more than prescribed Pain, Sleep and Anxiety Medications. It is not advisable to combine anxiety,sleep and pain medications without talking with your primary care practitioner   Special Instructions: If you have smoked or chewed Tobacco  in the last 2 yrs please stop smoking, stop any regular Alcohol  and or any Recreational drug use.   Wear Seat belts while driving.   Please note: You were cared for by a hospitalist during your hospital stay. Once you are discharged, your primary care physician will handle any further medical issues. Please note that NO REFILLS for any discharge medications will be authorized once you are discharged, as it is imperative that you return to your primary care physician (or establish a relationship with  a primary care physician if you do not have one) for your post hospital discharge needs so that they can reassess your need for medications and monitor your lab values  Time coordinating discharge: Over 30 minutes  SIGNED:   Modena Andes, MD  [facility] 11/18/2023, 8:38 AM *Please note that this is a verbal dictation therefore any spelling or grammatical errors are due to the "[employer] One" system interpretation. If 7PM-7AM, please contact night-coverage [email or web address]

## 2023-11-18 NOTE — TOC Progression Note (Addendum)
 Transition of Care [facility]) - Progression Note    Patient Details  Name: Sierra Logan MRN: [id number] Date of Birth: 1955-11-12  Transition of Care [facility]) CM/SW Contact  Juliane Och, LCSW Phone Number: 11/18/2023, 11:22 AM  Clinical Narrative:     11:22 AM Per hospitalist, patient is not medically ready to discharge to SNF. CSW informed SNF and will continue to follow for disposition needs. [facility] SNF confirmed they could admit patient tomorrow if medically ready and discharge summary is provided before 12:00. CSW relayed information to medical team.  Expected Discharge Plan: Skilled Nursing Facility Barriers to Discharge: Barriers Resolved  Expected Discharge Plan and Services In-house Referral: Clinical Social Work   Post Acute Care Choice: Skilled Nursing Facility Living arrangements for the past 2 months: Single Family Home Expected Discharge Date: 11/18/23                                     Social Determinants of Health (SDOH) Interventions SDOH Screenings   Food Insecurity: No Food Insecurity (11/08/2023)  Housing: Low Risk  (11/08/2023)  Transportation Needs: No Transportation Needs (11/08/2023)  Utilities: Not At Risk (11/08/2023)  Depression (PHQ2-9): Low Risk  (10/27/2023)  Social Connections: Moderately Isolated (11/08/2023)  Tobacco Use: High Risk (11/09/2023)    Readmission Risk Interventions     No data to display

## 2023-11-18 NOTE — Progress Notes (Signed)
 Nurse requested Mobility Specialist to perform oxygen saturation test with pt which includes removing pt from oxygen both at rest and while ambulating.  Below are the results from that testing.     Patient Saturations on Room Air at Rest = spO2 97%  Patient Saturations on Room Air while Ambulating = sp02 87% .    Patient Saturations on 2 Liters of oxygen while Ambulating = sp02 92%  At end of testing pt left in room on 3  Liters of oxygen.  Reported results to nurse.   Janit Meline Mobility Specialist Please contact via SecureChat or [employer] [phone]

## 2023-11-18 NOTE — Progress Notes (Signed)
 Mobility Specialist Progress Note;   11/18/23 1034  Mobility  Activity Ambulated with assistance in room  Level of Assistance Contact guard assist, steadying assist  Assistive Device Front wheel walker  Distance Ambulated (ft) 30 ft  Activity Response Tolerated well  Mobility Referral Yes  Mobility visit 1 Mobility  Mobility Specialist Start Time (ACUTE ONLY) 1034  Mobility Specialist Stop Time (ACUTE ONLY) 1048  Mobility Specialist Time Calculation (min) (ACUTE ONLY) 14 min   RN requesting walking O2 on pt, pt agreeable. On 3LO2 upon arrival. Attempted ambulation on RA, however SPO2 desat to 87%, requiring 2LO2 to stay [facility]. Required MinG assistance during ambulation for safety. Ambulated in room for safety. Pt returned back to bed with all needs met, alarm on. RN notified.   Janit Meline Mobility Specialist Please contact via SecureChat or [employer] [phone]

## 2023-11-18 NOTE — Plan of Care (Signed)

## 2023-11-18 NOTE — Progress Notes (Signed)
 PT Cancellation Note  Patient Details Name: Sierra Logan MRN: [id number] DOB: 1955/10/27   Cancelled Treatment:    Reason Eval/Treat Not Completed: (P) Patient at procedure or test/unavailable, pt working with mobility specialist. Will check back as schedule allows to continue with PT POC.  Beverly Buckler. PTA Acute Rehabilitation Services Office: [phone]    Agapito Horseman 11/18/2023, 10:37 AM

## 2023-11-18 NOTE — Progress Notes (Signed)
 PROGRESS NOTE    Sierra Logan  UJW:[id number] DOB: 1956-01-24 DOA: 11/07/2023 PCP: Adelia Homestead, MD   Brief Narrative:  68 year old with history of hypertension, hyperlipidemia, type 2 diabetes, history of cirrhosis with esophageal varices presented with swelling of legs and feet, melanotic black stool.  Ongoing for about a month.  In the emergency room patient was relatively stable.  Hemoglobin 10.3 with recent hemoglobin of 11.3.  Chest x-ray with moderate severity left basilar atelectasis or infiltrate, moderate-sized left pleural effusion.  FOBT positive.  Patient was admitted with anasarca secondary to liver disease and multiple other complex issues, details below.   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  Still has significant leg swelling, wrapped. - on Lasix , spironolactone , - due to ongoing orthostatic hypotension, placed on midodrine  15 mg 3 times daily - 2D echo showed EF of 60 to 65%, no regional WMA, pleural effusion on the left side. - paracentesis on 4/24, 1.8 L transudate removed.  Negative for SBP.  Antibiotics discontinued. Continue unna boot for the legs.  She is not feeling dizzy anymore and she is not orthostatic anymore either.  Tolerating current diuretics.  GI signed off with recommendations to continue current medications and they will follow-up with her as outpatient.   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  Gastric antral vascular ectasia with bleeding was present.  Cauterized.  Hemoglobin remained stable until this morning when it was 7.4, since this was significant drop from 9.2 just 2 days ago.  I repeated hemoglobin to make sure it was not an error and hemoglobin later again showed 7.2.  She is at verge of requiring blood transfusion.  Will repeat hemoglobin later today and transfuse if 7 or less.  Continue Protonix  and Carafate . -Outpatient follow-up with GI, will need repeat EGD in 1 to 3 months -Per GI, small varices, did not tolerate Coreg  well due to significant fatigue   Small to moderate left pleural effusion, abnormal chest x-ray  - Chest x-ray 4/21 showed left basilar atelectasis and/or infiltrate, small to moderate size left pleural effusion.  She is not complaining of shortness of breath, has crackles on examination and requiring 3 L of oxygen instead of 1 L of yesterday.  Repeating chest x-ray today.  Encouraged RN to educate her to use incentive parameter.  Diabetes mellitus type 2, controlled with - Hemoglobin A1c 4.8, not on any meds     Hyperlipidemia: - On a statin.  LFTs are normal.   History of hepatic encephalopathy - Currently stable, alert and oriented   Obesity class I Estimated body mass index is 32.38 kg/m as calculated from the following:   Height as of this encounter: 5\' 2"  (1.575 m).   Weight as of this encounter: 80.3 kg.  DVT prophylaxis: SCDs Start: 11/08/23 0853 SCDs Start: 11/08/23  5621   Code Status: Full Code  Family Communication:  None present at bedside.  Plan of care discussed with patient in length and he/she verbalized understanding and agreed with it.  Status is: Inpatient Remains inpatient appropriate because: Drop in hemoglobin, may need blood transfusion.  Not ready for discharge today.   Estimated body  mass index is 32.38 kg/m as calculated from the following:   Height as of this encounter: 5\' 2"  (1.575 m).   Weight as of this encounter: 80.3 kg.    Nutritional Assessment: Body mass index is 32.38 kg/m.[name] Seen by dietician.  I agree with the assessment and plan as outlined below: Nutrition Status:        . Skin Assessment: I have examined the patient's skin and I agree with the wound assessment as performed by the wound care RN as outlined below:    Consultants:  GI  Procedures:  As above  Antimicrobials:  Anti-infectives (From admission, onward)    Start     Dose/Rate Route Frequency Ordered Stop   11/08/23 1445  cefTRIAXone  (ROCEPHIN ) 1 g in sodium chloride  0.9 % 100 mL IVPB  Status:  Discontinued        1 g 200 mL/hr over 30 Minutes Intravenous Every 24 hours 11/08/23 1442 11/11/23 0948         Subjective: Patient seen and examined, she is complaining of shortness of breath.  Not dizzy anymore or any other complaint.  Objective: Vitals:   11/17/23 2244 11/18/23 0408 11/18/23 0800 11/18/23 0906  BP: (!) 126/58 (!) 101/52 (!) 123/52   Pulse: 100 100 99 90  Resp: 19 18 20 18   Temp: 99 F (37.2 C) 99.4 F (37.4 C) 99 F (37.2 C)   TempSrc: Oral Oral Oral   SpO2: 92% 91% 91% 94%  Weight:  80.3 kg    Height:        Intake/Output Summary (Last 24 hours) at 11/18/2023 1109 Last data filed at 11/18/2023 0913 Gross per 24 hour  Intake 949.4 ml  Output 900 ml  Net 49.4 ml   Filed Weights   11/16/23 0456 11/17/23 0552 11/18/23 0408  Weight: 80.3 kg 81.4 kg 80.3 kg    Examination:  General exam: Appears calm and comfortable  Respiratory system: Crackles at the bases. Respiratory effort normal. Cardiovascular system: S1 & S2 heard, RRR. No JVD, murmurs, rubs, gallops or clicks.  +2 pitting edema bilateral lower extremity. Gastrointestinal system: Abdomen is nondistended, soft and nontender. No organomegaly or masses felt. Normal bowel sounds heard. Central  nervous system: Alert and oriented. No focal neurological deficits. Extremities: Symmetric 5 x 5 power. Skin: No rashes, lesions or ulcers.  Psychiatry: Judgement and insight appear normal. Mood & affect appropriate.   Data Reviewed: I have personally reviewed following labs and imaging studies  CBC: Recent Labs  Lab 11/12/23 0218 11/13/23 0212 11/16/23 0748 11/18/23 0215 11/18/23 0939  WBC 5.2 7.0 5.6 6.4  --   NEUTROABS 3.3 4.9  --  4.7  --   HGB 9.0* 9.9* 9.2* 7.4* 7.2*  HCT 29.3* 33.6* 30.3* 24.3* 23.1*  MCV 86.7 89.1 85.4 84.4  --   PLT 119* 105* 143* 120*  --    Basic Metabolic Panel: Recent Labs  Lab 11/13/23 0212 11/15/23 1527 11/16/23 0748 11/17/23 0937 11/18/23 0215  NA 138 135 135 134* 133*  K 4.6 4.2 4.0 3.9 3.7  CL 104 102 102 99 99  CO2 23 25 25 26 27   GLUCOSE  98 84 99 91 95  BUN 11 9 9 11 12   CREATININE 0.53 0.71 0.71 0.68 0.58  CALCIUM  8.2* 8.0* 8.0* 8.5* 8.5*   GFR: Estimated Creatinine Clearance: 67 mL/min (by C-G formula based on SCr of 0.58 mg/dL). Liver Function Tests: Recent Labs  Lab 11/12/23 0218 11/13/23 0212 11/16/23 0748 11/18/23 0215  AST 26 47* 28 26  ALT 17 20 16 15   ALKPHOS 71 74 71 58  BILITOT 0.4 1.2 0.7 1.0  PROT 5.4* 6.3* 6.2* 6.7  ALBUMIN  1.7* 2.5* 2.2* 3.4*   No results for input(s): "LIPASE", "AMYLASE" in the last 168 hours. No results for input(s): "AMMONIA" in the last 168 hours. Coagulation Profile: No results for input(s): "INR", "PROTIME" in the last 168 hours. Cardiac Enzymes: No results for input(s): "CKTOTAL", "CKMB", "CKMBINDEX", "TROPONINI" in the last 168 hours. BNP (last 3 results) No results for input(s): "PROBNP" in the last 8760 hours. HbA1C: No results for input(s): "HGBA1C" in the last 72 hours. CBG: No results for input(s): "GLUCAP" in the last 168 hours. Lipid Profile: No results for input(s): "CHOL", "HDL", "LDLCALC", "TRIG", "CHOLHDL", "LDLDIRECT" in the last 72 hours. Thyroid  Function  Tests: No results for input(s): "TSH", "T4TOTAL", "FREET4", "T3FREE", "THYROIDAB" in the last 72 hours. Anemia Panel: No results for input(s): "VITAMINB12", "FOLATE", "FERRITIN", "TIBC", "IRON", "RETICCTPCT" in the last 72 hours. Sepsis Labs: No results for input(s): "PROCALCITON", "LATICACIDVEN" in the last 168 hours.  Recent Results (from the past 240 hours)  MRSA Next Gen by PCR, Nasal     Status: None   Collection Time: 11/08/23  5:17 PM   Specimen: Nasal Mucosa; Nasal Swab  Result Value Ref Range Status   MRSA by PCR Next Gen NOT DETECTED NOT DETECTED Final    Comment: (NOTE) The GeneXpert MRSA Assay (FDA approved for NASAL specimens only), is one component of a comprehensive MRSA colonization surveillance program. It is not intended to diagnose MRSA infection nor to guide or monitor treatment for MRSA infections. Test performance is not FDA approved in patients less than 13 years old. Performed at [facility], [address] [place], [place] [address]   Culture, body fluid w Gram Stain-bottle     Status: None   Collection Time: 11/10/23  8:25 AM   Specimen: Peritoneal Washings  Result Value Ref Range Status   Specimen Description PERITONEAL  Final   Special Requests NONE  Final   Culture   Final    NO GROWTH 5 DAYS Performed at [facility], [address], [place], [place] [address]    Report Status 11/15/2023 FINAL  Final  Gram stain     Status: None   Collection Time: 11/10/23  8:30 AM   Specimen: Peritoneal Washings  Result Value Ref Range Status   Specimen Description PERITONEAL  Final   Special Requests NONE  Final   Gram Stain   Final    CYTOSPIN SMEAR WBC SEEN NO ORGANISMS SEEN Performed at [facility], [address], [place], [place] [address]    Report Status 11/10/2023 FINAL  Final     Radiology Studies: No results found.  Scheduled Meds:  atorvastatin   10 mg Oral QHS   furosemide   40 mg Oral Daily   gabapentin   400 mg Oral  Daily   And   gabapentin   800 mg Oral QHS   liver oil-zinc  oxide   Topical TID   midodrine   15 mg Oral TID WC   pantoprazole   40 mg Oral BID   sodium chloride  flush  3 mL Intravenous Q12H   spironolactone   100 mg Oral Daily   sucralfate   1 g Oral TID WC & HS   traZODone   50 mg Oral QHS   Continuous Infusions:   LOS: 10 days   Modena Andes, MD [facility]  11/18/2023, 11:09 AM   *Please note that this is a verbal dictation therefore any spelling or grammatical errors are due to the "[employer]agon [employer] One" system interpretation.  Please page via Amion and do not message via secure chat for urgent patient care matters. Secure chat can be used for non urgent patient care matters.  How to contact the [facility] Attending or Consulting provider 7A - 7P or covering provider during after hours 7P -7A, for this patient?  Check the care team in [facility] and look for a) attending/consulting [facility] provider listed and b) the [facility] team listed. Page or secure chat 7A-7P. Log into [email or web address] and use [place]'s universal password to access. If you do not have the password, please contact the hospital operator. Locate the [facility] provider you are looking for under [facility] and page to a number that you can be directly reached. If you still have difficulty reaching the provider, please page the [facility] (Director on Call) for the Hospitalists listed on amion for assistance.

## 2023-11-19 ENCOUNTER — Encounter (HOSPITAL_COMMUNITY): Payer: Self-pay | Admitting: Internal Medicine

## 2023-11-19 LAB — PREPARE RBC (CROSSMATCH)

## 2023-11-19 LAB — CBC WITH DIFFERENTIAL/PLATELET
Abs Immature Granulocytes: 0.02 10*3/uL (ref 0.00–0.07)
Basophils Absolute: 0 10*3/uL (ref 0.0–0.1)
Basophils Relative: 1 %
Eosinophils Absolute: 0 10*3/uL (ref 0.0–0.5)
Eosinophils Relative: 0 %
HCT: 21.5 % — ABNORMAL LOW (ref 36.0–46.0)
Hemoglobin: 6.5 g/dL — CL (ref 12.0–15.0)
Immature Granulocytes: 0 %
Lymphocytes Relative: 18 %
Lymphs Abs: 0.9 10*3/uL (ref 0.7–4.0)
MCH: 25.8 pg — ABNORMAL LOW (ref 26.0–34.0)
MCHC: 30.2 g/dL (ref 30.0–36.0)
MCV: 85.3 fL (ref 80.0–100.0)
Monocytes Absolute: 0.7 10*3/uL (ref 0.1–1.0)
Monocytes Relative: 14 %
Neutro Abs: 3.3 10*3/uL (ref 1.7–7.7)
Neutrophils Relative %: 67 %
Platelets: 133 10*3/uL — ABNORMAL LOW (ref 150–400)
RBC: 2.52 MIL/uL — ABNORMAL LOW (ref 3.87–5.11)
RDW: 15.7 % — ABNORMAL HIGH (ref 11.5–15.5)
WBC: 5 10*3/uL (ref 4.0–10.5)
nRBC: 0 % (ref 0.0–0.2)

## 2023-11-19 LAB — HEMOGLOBIN AND HEMATOCRIT, BLOOD
HCT: 23.3 % — ABNORMAL LOW (ref 36.0–46.0)
HCT: 23.7 % — ABNORMAL LOW (ref 36.0–46.0)
Hemoglobin: 7 g/dL — ABNORMAL LOW (ref 12.0–15.0)
Hemoglobin: 7.4 g/dL — ABNORMAL LOW (ref 12.0–15.0)

## 2023-11-19 MED ORDER — SODIUM CHLORIDE 0.9% IV SOLUTION: Freq: Once | INTRAVENOUS | Status: DC

## 2023-11-19 MED ORDER — MORPHINE SULFATE (PF) 2 MG/ML IV SOLN
2.0000 mg | INTRAVENOUS | Status: DC | PRN
  Administered 2023-11-19: 2 mg via INTRAVENOUS
  Filled 2023-11-19: qty 1

## 2023-11-19 MED ORDER — FUROSEMIDE 10 MG/ML IJ SOLN
40.0000 mg | Freq: Once | INTRAMUSCULAR | Status: AC
  Administered 2023-11-19: 40 mg via INTRAVENOUS
  Filled 2023-11-19: qty 4

## 2023-11-19 NOTE — Plan of Care (Signed)

## 2023-11-19 NOTE — Progress Notes (Signed)
 PROGRESS NOTE    Sierra Logan  WUJ:[id number] DOB: Apr 26, 1956 DOA: 11/07/2023 PCP: Adelia Homestead, MD   Brief Narrative:  As per prior documentation: "68 year old with history of hypertension, hyperlipidemia, type 2 diabetes, history of cirrhosis with esophageal varices presented with swelling of legs and feet, melanotic black stool.  Ongoing for about a month.  In the emergency room patient was relatively stable.  Hemoglobin 10.3 with recent hemoglobin of 11.3.  Chest x-ray with moderate severity left basilar atelectasis or infiltrate, moderate-sized left pleural effusion.  FOBT positive.  Patient was admitted with anasarca secondary to liver disease and multiple other complex issues, details below".   11/19/2023: Patient seen alongside patient's husband.  Hemoglobin dropped to 6.5 g/dL at 7 g/dL.  Patient had EGD on 11/09/2023 that revealed bleeding gastric antral vascular ectasia.  GI team has been reconsulted.  Patient remains volume overloaded.  Will give IV Lasix  40 Mg x 1 dose.  Will continue to assess patient.  Previous echocardiogram reviewed.  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  Still has significant leg swelling, wrapped. - on Lasix , spironolactone , - due to ongoing orthostatic hypotension, placed on midodrine  15 mg 3 times daily - 2D echo showed EF of 60 to 65%, no regional WMA, pleural effusion on the left side. -  paracentesis on 4/24, 1.8 L transudate removed.  Negative for SBP.  Antibiotics discontinued. Continue unna boot for the legs.  She is not feeling dizzy anymore and she is not orthostatic anymore either.  Tolerating current diuretics.  GI signed off with recommendations to continue current medications and they will follow-up with her as outpatient. 11/19/2023: Patient remains volume overloaded.  GI team has been consulted.   ABLA, history of chronic microcytic anemia due to upper GI bleed from cirrhosis  - s/p EGD , grade 2 varices in the lower third of esophagus without evidence of bleeding.  Gastric antral vascular ectasia with bleeding was present.  Cauterized.  Hemoglobin remained stable until this morning when it was 7.4, since this was significant drop from 9.2 just 2 days ago.  I repeated hemoglobin to make sure it was not an error and hemoglobin later again showed 7.2.  She is at verge of requiring blood transfusion.  Will repeat hemoglobin later today and transfuse if 7 or less.  Continue Protonix  and Carafate . -Outpatient follow-up with GI, will need repeat EGD in 1 to 3 months -Per GI, small varices, did not tolerate Coreg  well due to significant fatigue 12/06/2023: Hemoglobin continues to drop.  Recent EGD on 11/09/2023 that revealed esophageal varices and gastric antral vascular antral ectasia with bleeding.  Hemoglobin of 6.5 g/dL To 7 g/dL.  Transfuse 1 unit of packed red blood cells.  Consult GI team.   11/19/2023: Hemoglobin of 6.5 g/dL to 7 g/dL.  Will transfuse 1 unit of packed red blood cells.  GI team has been consulted.  EGD done on 11/09/2023 revealed bleeding gastric antral vascular ectasia.  Small to moderate left pleural effusion, abnormal chest x-ray  - Chest x-ray 4/21 showed left basilar atelectasis and/or infiltrate, small to moderate size left pleural effusion.  She is not complaining of shortness of breath, has crackles on examination and requiring 3 L of oxygen instead of 1 L of  yesterday.  Repeating chest x-ray today.  Encouraged RN to educate her to use incentive parameter.  Diabetes mellitus type 2, controlled with - Hemoglobin A1c 4.8, not on any meds     Hyperlipidemia: - On a statin.  LFTs are normal.   History of hepatic encephalopathy - Currently stable, alert and oriented   Obesity class I Estimated body mass index is 32.38 kg/m as calculated from the following:   Height as of this encounter: 5\' 2"  (1.575 m).   Weight as of this encounter: 80.3 kg.  DVT prophylaxis: SCDs Start: 11/08/23 0853 SCDs Start: 11/08/23 0427   Code Status: Full Code  Family Communication:  None present at bedside.  Plan of care discussed with patient in length and he/she verbalized understanding and agreed with it.  Status is: Inpatient Remains inpatient appropriate because: Drop in hemoglobin, may need blood transfusion.  Not ready for discharge today.   Estimated body mass index is 31.81 kg/m as calculated from the following:   Height as of this encounter: 5\' 2"  (1.575 m).   Weight as of this encounter: 78.9 kg.    Nutritional Assessment: Body mass index is 31.81 kg/m.[name] Seen by dietician.  I agree with the assessment and plan as outlined below: Nutrition Status:        . Skin Assessment: I have examined the patient's skin and I agree with the wound assessment as performed by the wound care RN as outlined below:    Consultants:  GI  Procedures:  As above  Antimicrobials:  Anti-infectives (From admission, onward)    Start     Dose/Rate Route Frequency Ordered Stop   11/08/23 1445  cefTRIAXone  (ROCEPHIN ) 1 g in sodium chloride  0.9 % 100 mL IVPB  Status:  Discontinued        1 g 200 mL/hr over 30 Minutes Intravenous Every 24 hours 11/08/23 1442 11/11/23 0948         Subjective: No new complaints. Volume overload persists. Hemoglobin continues to drop. Recent EGD revealed bleeding source.  Objective: Vitals:   11/18/23 2255 11/19/23 0350  11/19/23 0734 11/19/23 1142  BP: (!) 104/47 (!) 92/48 (!) 106/44 (!) 103/47  Pulse: 91 93 87 81  Resp: 17 20 20 19   Temp: 98.6 F (37 C) 100 F (37.8 C) 98.8 F (37.1 C) 98.6 F (37 C)  TempSrc: Oral Oral Oral Oral  SpO2: 96% 94% 97% 93%  Weight:  78.9 kg    Height:        Intake/Output Summary (Last 24 hours) at 11/19/2023 1552 Last data filed at 11/19/2023 0739 Gross per 24 hour  Intake 320 ml  Output --  Net 320 ml   Filed Weights   11/17/23 0552 11/18/23 0408 11/19/23 0350  Weight: 81.4 kg 80.3 kg 78.9 kg    Examination:  General exam: Not in any distress. Respiratory system: Decreased air entry. Cardiovascular system: S1 & S2. Gastrointestinal system: Abdomen is soft and nontender. Central nervous system: Awake and alert. Extremities: 2-2+ bilateral lower extremity edema.  Data Reviewed: I have personally reviewed following labs and imaging studies  CBC: Recent Labs  Lab 11/13/23 0212 11/16/23 0748 11/18/23 0215 11/18/23 0939 11/18/23 1727 11/19/23  1914 11/19/23 1227  WBC 7.0 5.6 6.4  --  7.0 5.0  --   NEUTROABS 4.9  --  4.7  --  5.0 3.3  --   HGB 9.9* 9.2* 7.4* 7.2* 7.9* 6.5* 7.0*  HCT 33.6* 30.3* 24.3* 23.1* 26.8* 21.5* 23.3*  MCV 89.1 85.4 84.4  --  86.5 85.3  --   PLT 105* 143* 120*  --  145* 133*  --    Basic Metabolic Panel: Recent Labs  Lab 11/13/23 0212 11/15/23 1527 11/16/23 0748 11/17/23 0937 11/18/23 0215  NA 138 135 135 134* 133*  K 4.6 4.2 4.0 3.9 3.7  CL 104 102 102 99 99  CO2 23 25 25 26 27   GLUCOSE 98 84 99 91 95  BUN 11 9 9 11 12   CREATININE 0.53 0.71 0.71 0.68 0.58  CALCIUM  8.2* 8.0* 8.0* 8.5* 8.5*   GFR: Estimated Creatinine Clearance: 66.4 mL/min (by C-G formula based on SCr of 0.58 mg/dL). Liver Function Tests: Recent Labs  Lab 11/13/23 0212 11/16/23 0748 11/18/23 0215  AST 47* 28 26  ALT 20 16 15   ALKPHOS 74 71 58  BILITOT 1.2 0.7 1.0  PROT 6.3* 6.2* 6.7  ALBUMIN  2.5* 2.2* 3.4*   No results for input(s):  "LIPASE", "AMYLASE" in the last 168 hours. No results for input(s): "AMMONIA" in the last 168 hours. Coagulation Profile: No results for input(s): "INR", "PROTIME" in the last 168 hours. Cardiac Enzymes: No results for input(s): "CKTOTAL", "CKMB", "CKMBINDEX", "TROPONINI" in the last 168 hours. BNP (last 3 results) No results for input(s): "PROBNP" in the last 8760 hours. HbA1C: No results for input(s): "HGBA1C" in the last 72 hours. CBG: No results for input(s): "GLUCAP" in the last 168 hours. Lipid Profile: No results for input(s): "CHOL", "HDL", "LDLCALC", "TRIG", "CHOLHDL", "LDLDIRECT" in the last 72 hours. Thyroid  Function Tests: No results for input(s): "TSH", "T4TOTAL", "FREET4", "T3FREE", "THYROIDAB" in the last 72 hours. Anemia Panel: No results for input(s): "VITAMINB12", "FOLATE", "FERRITIN", "TIBC", "IRON", "RETICCTPCT" in the last 72 hours. Sepsis Labs: No results for input(s): "PROCALCITON", "LATICACIDVEN" in the last 168 hours.  Recent Results (from the past 240 hours)  Culture, body fluid w Gram Stain-bottle     Status: None   Collection Time: 11/10/23  8:25 AM   Specimen: Peritoneal Washings  Result Value Ref Range Status   Specimen Description PERITONEAL  Final   Special Requests NONE  Final   Culture   Final    NO GROWTH 5 DAYS Performed at [facility], [address] [place], [place] [address]    Report Status 11/15/2023 FINAL  Final  Gram stain     Status: None   Collection Time: 11/10/23  8:30 AM   Specimen: Peritoneal Washings  Result Value Ref Range Status   Specimen Description PERITONEAL  Final   Special Requests NONE  Final   Gram Stain   Final    CYTOSPIN SMEAR WBC SEEN NO ORGANISMS SEEN Performed at [facility], [address], [place], [place] [address]    Report Status 11/10/2023 FINAL  Final     Radiology Studies: DG CHEST PORT 1 VIEW Result Date: 11/18/2023 CLINICAL DATA:  Pulmonary edema EXAM: PORTABLE CHEST 1 VIEW  COMPARISON:  11/07/2023 FINDINGS: Large but stable left pleural effusion with associated passive atelectasis. Upper zone pulmonary vascular prominence is nonspecific given the semi erect positioning. Borderline enlargement of the cardiopericardial silhouette, although the left heart border is obscured. Mild lower thoracic spondylosis.  Atherosclerotic calcification of the aortic arch. IMPRESSION: 1. Large but stable left pleural effusion with associated passive atelectasis. 2. Upper zone pulmonary vascular prominence is nonspecific given the semi erect positioning. 3. Borderline enlargement of the cardiopericardial silhouette. 4.  Aortic Atherosclerosis (ICD10-I70.0). Electronically Signed   By: Freida Jes M.D.   On: 11/18/2023 11:10    Scheduled Meds:  sodium chloride    Intravenous Once   atorvastatin   10 mg Oral QHS   furosemide   40 mg Oral Daily   gabapentin   400 mg Oral Daily   And   gabapentin   800 mg Oral QHS   liver oil-zinc  oxide   Topical TID   midodrine   15 mg Oral TID WC   pantoprazole   40 mg Oral BID   sodium chloride  flush  3 mL Intravenous Q12H   spironolactone   100 mg Oral Daily   sucralfate   1 g Oral TID WC & HS   traZODone   50 mg Oral QHS   Continuous Infusions:   LOS: 11 days   Doroteo Gasmen, MD [facility]  11/19/2023, 3:52 PM   *Please note that this is a verbal dictation therefore any spelling or grammatical errors are due to the "Dragon [employer] One" system interpretation.  Please page via Amion and do not message via secure chat for urgent patient care matters. Secure chat can be used for non urgent patient care matters.  How to contact the [facility] Attending or Consulting provider 7A - 7P or covering provider during after hours 7P -7A, for this patient?  Check the care team in [facility] and look for a) attending/consulting [facility] provider listed and b) the [facility] team listed. Page or secure chat 7A-7P. Log into [email or web address] and use [place] universal  password to access. If you do not have the password, please contact the hospital operator. Locate the [facility] provider you are looking for under [facility] and page to a number that you can be directly reached. If you still have difficulty reaching the provider, please page the [facility] (Director on Call) for the Hospitalists listed on amion for assistance.

## 2023-11-19 NOTE — Progress Notes (Signed)
 Mobility Specialist Progress Note:    11/19/23 1010  Mobility  Activity Ambulated with assistance in hallway  Level of Assistance Contact guard assist, steadying assist  Assistive Device Front wheel walker  Distance Ambulated (ft) 100 ft  Activity Response Tolerated well  Mobility Referral Yes  Mobility visit 1 Mobility  Mobility Specialist Start Time (ACUTE ONLY) [id number]  Mobility Specialist Stop Time (ACUTE ONLY) 1005  Mobility Specialist Time Calculation (min) (ACUTE ONLY) 12 min   Received pt on BSC having no complaints and agreeable to mobility. Pt was asymptomatic throughout session. Ambulated on 3L/min VSS. Left in bed w/ call bell in reach and all needs met.   Inetta Manes Mobility Specialist  Please contact vis Secure Chat or  Rehab Office [phone]

## 2023-11-20 DIAGNOSIS — K7460 Unspecified cirrhosis of liver: Secondary | ICD-10-CM | POA: Diagnosis not present

## 2023-11-20 DIAGNOSIS — K922 Gastrointestinal hemorrhage, unspecified: Secondary | ICD-10-CM | POA: Diagnosis not present

## 2023-11-20 DIAGNOSIS — K7290 Hepatic failure, unspecified without coma: Secondary | ICD-10-CM | POA: Diagnosis not present

## 2023-11-20 DIAGNOSIS — R601 Generalized edema: Secondary | ICD-10-CM | POA: Diagnosis not present

## 2023-11-20 DIAGNOSIS — D62 Acute posthemorrhagic anemia: Secondary | ICD-10-CM | POA: Diagnosis not present

## 2023-11-20 DIAGNOSIS — K31811 Angiodysplasia of stomach and duodenum with bleeding: Secondary | ICD-10-CM | POA: Diagnosis not present

## 2023-11-20 LAB — CBC WITH DIFFERENTIAL/PLATELET
Abs Immature Granulocytes: 0.02 10*3/uL (ref 0.00–0.07)
Basophils Absolute: 0 10*3/uL (ref 0.0–0.1)
Basophils Relative: 1 %
Eosinophils Absolute: 0 10*3/uL (ref 0.0–0.5)
Eosinophils Relative: 0 %
HCT: 23.3 % — ABNORMAL LOW (ref 36.0–46.0)
Hemoglobin: 7.3 g/dL — ABNORMAL LOW (ref 12.0–15.0)
Immature Granulocytes: 1 %
Lymphocytes Relative: 22 %
Lymphs Abs: 0.9 10*3/uL (ref 0.7–4.0)
MCH: 26.4 pg (ref 26.0–34.0)
MCHC: 31.3 g/dL (ref 30.0–36.0)
MCV: 84.1 fL (ref 80.0–100.0)
Monocytes Absolute: 0.5 10*3/uL (ref 0.1–1.0)
Monocytes Relative: 12 %
Neutro Abs: 2.8 10*3/uL (ref 1.7–7.7)
Neutrophils Relative %: 64 %
Platelets: 123 10*3/uL — ABNORMAL LOW (ref 150–400)
RBC: 2.77 MIL/uL — ABNORMAL LOW (ref 3.87–5.11)
RDW: 15.9 % — ABNORMAL HIGH (ref 11.5–15.5)
WBC: 4.2 10*3/uL (ref 4.0–10.5)
nRBC: 0.5 % — ABNORMAL HIGH (ref 0.0–0.2)

## 2023-11-20 LAB — COMPREHENSIVE METABOLIC PANEL WITH GFR
ALT: 16 U/L (ref 0–44)
AST: 27 U/L (ref 15–41)
Albumin: 2.9 g/dL — ABNORMAL LOW (ref 3.5–5.0)
Alkaline Phosphatase: 68 U/L (ref 38–126)
Anion gap: 7 (ref 5–15)
BUN: 9 mg/dL (ref 8–23)
CO2: 29 mmol/L (ref 22–32)
Calcium: 8.3 mg/dL — ABNORMAL LOW (ref 8.9–10.3)
Chloride: 100 mmol/L (ref 98–111)
Creatinine, Ser: 0.66 mg/dL (ref 0.44–1.00)
GFR, Estimated: >60 mL/min (ref >60)
Glucose, Bld: 84 mg/dL (ref 70–99)
Potassium: 3.6 mmol/L (ref 3.5–5.1)
Sodium: 136 mmol/L (ref 135–145)
Total Bilirubin: 1.2 mg/dL (ref 0.0–1.2)
Total Protein: 6.3 g/dL — ABNORMAL LOW (ref 6.5–8.1)

## 2023-11-20 LAB — HEMOGLOBIN AND HEMATOCRIT, BLOOD
HCT: 29.4 % — ABNORMAL LOW (ref 36.0–46.0)
Hemoglobin: 9.2 g/dL — ABNORMAL LOW (ref 12.0–15.0)

## 2023-11-20 LAB — OCCULT BLOOD X 1 CARD TO LAB, STOOL: Fecal Occult Bld: POSITIVE — AB

## 2023-11-20 LAB — PREPARE RBC (CROSSMATCH)

## 2023-11-20 MED ORDER — FUROSEMIDE 40 MG PO TABS
40.0000 mg | ORAL_TABLET | Freq: Two times a day (BID) | ORAL | Status: DC
  Administered 2023-11-21 – 2023-11-23 (×4): 40 mg via ORAL
  Filled 2023-11-20 (×5): qty 1

## 2023-11-20 MED ORDER — WHITE PETROLATUM EX OINT
TOPICAL_OINTMENT | CUTANEOUS | Status: DC | PRN
  Filled 2023-11-20: qty 28.35

## 2023-11-20 MED ORDER — FUROSEMIDE 10 MG/ML IJ SOLN
40.0000 mg | Freq: Once | INTRAMUSCULAR | Status: AC
  Administered 2023-11-20: 40 mg via INTRAVENOUS
  Filled 2023-11-20: qty 4

## 2023-11-20 MED ORDER — SODIUM CHLORIDE 0.9% IV SOLUTION: Freq: Once | INTRAVENOUS | Status: DC

## 2023-11-20 NOTE — Progress Notes (Signed)
 Mobility Specialist Progress Note:    11/20/23 1030  Mobility  Activity Ambulated with assistance in hallway  Level of Assistance Contact guard assist, steadying assist  Assistive Device Front wheel walker  Distance Ambulated (ft) 100 ft  Activity Response Tolerated well  Mobility Referral Yes  Mobility visit 1 Mobility  Mobility Specialist Start Time (ACUTE ONLY) 1002  Mobility Specialist Stop Time (ACUTE ONLY) 1012  Mobility Specialist Time Calculation (min) (ACUTE ONLY) 10 min   Received pt seated EOB having no complaints and agreeable to mobility. Pt was asymptomatic throughout session. Ambulated on 3L/min VSS. Left in bed w/ call bell in reach and all needs met.   Inetta Manes Mobility Specialist  Please contact vis Secure Chat or  Rehab Office [phone]

## 2023-11-20 NOTE — Plan of Care (Signed)

## 2023-11-20 NOTE — Progress Notes (Signed)
 [facility]                                                                               Sierra Logan, is a 68 y.o. female, DOB - 02-18-1956, VHQ:[id number] Admit date - 11/07/2023    Outpatient Primary MD for the patient is Adelia Homestead, MD  LOS - 12  days    Brief summary    68 year old with history of hypertension, hyperlipidemia, type 2 diabetes, history of cirrhosis with esophageal varices presented with swelling of legs and feet, melanotic black stool.  Ongoing for about a month.  In the emergency room patient was relatively stable.  Hemoglobin 10.3 with recent hemoglobin of 11.3.  Chest x-ray with moderate severity left basilar atelectasis or infiltrate, moderate-sized left pleural effusion.  FOBT positive.  Patient was admitted with anasarca secondary to liver disease and multiple other complex issues, details below".    11/19/2023: Patient seen alongside patient's husband.  Hemoglobin dropped to 6.5 g/dL at 7 g/dL.  Patient had EGD on 11/09/2023 that revealed bleeding gastric antral vascular ectasia.  GI team has been reconsulted.  Assessment & Plan    Assessment and Plan:   Fluid overload secondary to decompensated PBC cirrhosis and anasarca/orthostatic hypotension:  Volume overloaded, on lasix  40 mg daily, transitioned to BID dosing.  - due to ongoing orthostatic hypotension, placed on midodrine  15 mg 3 times daily - 2D echo showed EF of 60 to 65%, no regional WMA, pleural effusion on the left side. - paracentesis on 4/24, 1.8 L transudate removed.  Negative for SBP.   - she was found to have large pleural effusion, US  thoracentesis ordered.  - will monitor her on BID dosing of lasix   and spironolactone  100 mg daily. and monitor renal parameters.     ABLA, history of chronic microcytic anemia due to upper GI bleed from cirrhosis  - s/p EGD , grade 2 varices in the lower third of esophagus without evidence of bleeding.  Gastric antral vascular ectasia  with bleeding was present.  Cauterized.    Recent EGD on 11/09/2023 that revealed esophageal varices and gastric antral vascular antral ectasia with bleeding.  GI on board. Recommended supportive care with BID PPI and sucralfate .     Small to moderate left pleural effusion, abnormal chest x-ray  US  thoracentesis ordered.  Monitor.    Diabetes mellitus type 2, controlled  - Hemoglobin A1c 4.8, Diet controlled.    Hyperlipidemia: - On a statin.  LFTs are normal.   History of hepatic encephalopathy - Currently stable, alert and oriented   Obesity class I Estimated body mass index is 32.38 kg/m as calculated from the following:   Height as of this encounter: 5\' 2"  (1.575 m).   Weight as of this encounter: 80.3 kg.  Estimated body mass index is 31.37 kg/m as calculated from the following:   Height as of this encounter: 5\' 2"  (1.575 m).   Weight as of this encounter: 77.8 kg.  Code Status: full code.  DVT Prophylaxis:  SCDs Start: 11/08/23 0853 SCDs Start: 11/08/23 0427   Level of Care: Level of care: Progressive Family Communication: none at bedside.   Disposition Plan:  Remains inpatient appropriate:   Diuresis, monitor hemoglobin.   Procedures:  None.   Consultants:   Gastroenterology.   Antimicrobials:   Anti-infectives (From admission, onward)    Start     Dose/Rate Route Frequency Ordered Stop   11/08/23 1445  cefTRIAXone  (ROCEPHIN ) 1 g in sodium chloride  0.9 % 100 mL IVPB  Status:  Discontinued        1 g 200 mL/hr over 30 Minutes Intravenous Every 24 hours 11/08/23 1442 11/11/23 0948        Medications  Scheduled Meds:  sodium chloride    Intravenous Once   sodium chloride    Intravenous Once   atorvastatin   10 mg Oral QHS   furosemide   40 mg Oral Daily   gabapentin   400 mg Oral Daily   And   gabapentin   800 mg Oral QHS   liver oil-zinc  oxide   Topical TID   midodrine   15 mg Oral TID WC   pantoprazole   40 mg Oral BID   sodium chloride  flush  3  mL Intravenous Q12H   spironolactone   100 mg Oral Daily   sucralfate   1 g Oral TID WC & HS   traZODone   50 mg Oral QHS   Continuous Infusions: PRN Meds:.acetaminophen  **OR** acetaminophen , albuterol , melatonin, morphine  injection, ondansetron  (ZOFRAN ) IV, mouth rinse, white petrolatum    Subjective:   Sierra Logan was seen and examined today.  On 3lit of [place] oxygen.   Objective:   Vitals:   11/20/23 1134 11/20/23 1355 11/20/23 1423 11/20/23 1723  BP: (!) 96/47 93/64 (!) 100/46 (!) 108/50  Pulse: 79 78 78 78  Resp: 16 (!) 21 19 17   Temp: 98.9 F (37.2 C) 97.7 F (36.5 C) 98.6 F (37 C) 98.2 F (36.8 C)  TempSrc: Oral Oral Oral   SpO2: 93% 98% 97% 97%  Weight:      Height:        Intake/Output Summary (Last 24 hours) at 11/20/2023 1734 Last data filed at 11/20/2023 1723 Gross per 24 hour  Intake 953 ml  Output 1160 ml  Net -207 ml   Filed Weights   11/18/23 0408 11/19/23 0350 11/20/23 0407  Weight: 80.3 kg 78.9 kg 77.8 kg     Exam General exam: Appears calm and comfortable  Respiratory system: diminished air entry at bases. Left more than right. On [place] oxygen.  Cardiovascular system: S1 & S2 heard, RRR. No JVD, Gastrointestinal system: Abdomen is nondistended, soft and nontender.  Central nervous system: Alert and oriented. No focal neurological deficits. Extremities: 3+ leg edema.  Skin: No rashes, Psychiatry: Mood & affect appropriate.    Data Reviewed:  I have personally reviewed following labs and imaging studies   CBC Lab Results  Component Value Date   WBC 4.2 11/20/2023   RBC 2.77 (L) 11/20/2023   HGB 7.3 (L) 11/20/2023   HCT 23.3 (L) 11/20/2023   MCV 84.1 11/20/2023   MCH 26.4 11/20/2023   PLT 123 (L) 11/20/2023   MCHC 31.3 11/20/2023   RDW 15.9 (H) 11/20/2023   LYMPHSABS 0.9 11/20/2023   MONOABS 0.5 11/20/2023   EOSABS 0.0 11/20/2023   BASOSABS 0.0 11/20/2023     Last metabolic panel Lab Results  Component Value Date   NA 136  11/20/2023   K 3.6 11/20/2023   CL 100 11/20/2023   CO2 29 11/20/2023   BUN 9 11/20/2023   CREATININE 0.66 11/20/2023   GLUCOSE 84 11/20/2023   GFRNONAA >60 11/20/2023   CALCIUM  8.3 (  L) 11/20/2023   PHOS 3.6 11/10/2023   PROT 6.3 (L) 11/20/2023   ALBUMIN  2.9 (L) 11/20/2023   BILITOT 1.2 11/20/2023   ALKPHOS 68 11/20/2023   AST 27 11/20/2023   ALT 16 11/20/2023   ANIONGAP 7 11/20/2023    CBG (last 3)  No results for input(s): "GLUCAP" in the last 72 hours.    Coagulation Profile: No results for input(s): "INR", "PROTIME" in the last 168 hours.   Radiology Studies: No results found.     Feliciana Horn M.D. [facility] 11/20/2023, 5:34 PM  Available via Epic secure chat 7am-7pm After 7 pm, please refer to night coverage provider listed on amion.

## 2023-11-20 NOTE — Progress Notes (Signed)
 Reconsult  DOA: 11/07/2023 Hospital Day: 14   Chief Complaint:  worsening anemia  ASSESSMENT    Brief GI history 68 year old female admitted several days ago with decompensated PBC cirrhosis with ascites, encephalopathy, orsening anemia with FOBT positive.  We evaluated and she underwent EGD with APC of GAVE on 11/07/2023.  Small esophageal varices found. Coreg  initiated and diuretics were adjusted to manage ascites.  She became fatigued so Coreg  was held and diuretic dosages reduced.  Eventually we were able to increase diuretics back to Lasix  40/Aldactone  100.  We signed off on 11/16/2023 with plans for repeat EGD in 4 to 6 weeks.  Also at some point in the outpatient setting we would address whether or not to put her on Urso  and whether to resume Coreg .  We were reconsulted yesterday for declining hemoglobin.   GI bleed GAVE, S/p APC ablation on 4/23 Anemia Post APC on 4/23 her hgb remained in the 9 range until 2 days ago when it dropped to 7.4.  Yesterday it declined further to 6.5 but repeat labs a few hours later showed spontaneous increase of hemoglobin to 7. She subsequently got a unit of RBCs last evening.  Today:  Minimal improvement in hemoglobin from 7 to 7.3 following unit of blood last night. She reports 4-5 semi loose black BMs yesterday. No BMs today. On DRE there is a scant amount of soft light brown stool in vault  Decompensated PBC cirrhosis  Ascites /edema - No SBP on 1.8 L LVP.  Getting lasix  40 mg / aldactone  100 mg and 2 gram Na diet. Today: Renal function normal. Total 1160 ml UOP yesterday  Encephalopathy - resolved.   Small varices / portal hypertensive gastropathy - Didn't tolerate trial of Carvedilol   Hypotension On midodrine   Large, stable L pleural effusion.   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  --She will need to re-establish care with [facility] given her long absence form outpt monitoring --Okay to delay a retrial of beta blocker to outpt setting. --Defer need for URSO  until outpatient appointment --Continue BID PPI  --Continue carafate  QID --Monitor H/H --If rebleeds / hgb declines again may need repeat EGD    Subjective   No complaints . Had 4 black BMs yesterday. No BMs yet today  Objective   GI Studies:   11/07/2023 EGD for GI bleed --Grade 2 esophageal varices --GAVE, treated with APC -- Portal hypertensive gastropathy -- Erythematous duodenopathy -- No specimens collected   Recent Labs    11/18/23 1727 11/19/23 0907 11/19/23 1227 11/19/23 2153 11/20/23 0216  WBC 7.0 5.0  --   --  4.2  HGB 7.9* 6.5* 7.0* 7.4* 7.3*  HCT 26.8* 21.5* 23.3* 23.7* 23.3*  MCV 86.5 85.3  --   --  84.1  PLT 145* 133*  --   --  123*   No results for input(s): "FOLATE", "VITAMINB12", "FERRITIN", "TIBC", "IRONPCTSAT"  in the last 72 hours. Recent Labs    11/18/23 0215 11/20/23 0216  NA 133* 136  K 3.7 3.6  CL 99 100  CO2 27 29  GLUCOSE 95 84  BUN 12 9  CREATININE 0.58 0.66  CALCIUM  8.5* 8.3*   Recent Labs    11/18/23 0215 11/20/23 0216  PROT 6.7 6.3*  ALBUMIN  3.4* 2.9*  AST 26 27  ALT 15 16  ALKPHOS 58 68  BILITOT 1.0 1.2   No results for input(s): "INR" in the last 72 hours. No results for input(s): "AFPTUMOR" in the last 72 hours.   No results for input(s): "ANA" in the last 72 hours.  Imaging:  DG CHEST PORT 1 VIEW CLINICAL DATA:  Pulmonary  edema  EXAM: PORTABLE CHEST 1 VIEW  COMPARISON:  11/07/2023  FINDINGS: Large but stable left pleural effusion with associated passive atelectasis.  Upper zone pulmonary vascular prominence is nonspecific given the semi erect positioning.  Borderline enlargement of the cardiopericardial silhouette, although the left heart border is obscured.  Mild lower thoracic spondylosis.  Atherosclerotic calcification of the aortic arch.  IMPRESSION: 1. Large but stable left pleural effusion with associated passive atelectasis. 2. Upper zone pulmonary vascular prominence is nonspecific given the semi erect positioning. 3. Borderline enlargement of the cardiopericardial silhouette. 4.  Aortic Atherosclerosis (ICD10-I70.0).  Electronically Signed   By: Freida Jes M.D.   On: 11/18/2023 11:10     Scheduled inpatient medications:   sodium chloride    Intravenous Once   sodium chloride    Intravenous Once   atorvastatin   10 mg Oral QHS   furosemide   40 mg Oral Daily   gabapentin   400 mg Oral Daily   And   gabapentin   800 mg Oral QHS   liver oil-zinc  oxide   Topical TID   midodrine   15 mg Oral TID WC   pantoprazole   40 mg Oral BID   sodium chloride  flush  3 mL Intravenous Q12H   spironolactone   100 mg Oral Daily   sucralfate   1 g Oral TID WC & HS   traZODone   50 mg Oral QHS   Continuous inpatient infusions:  PRN inpatient medications: acetaminophen  **OR** acetaminophen , albuterol , melatonin, morphine  injection, ondansetron  (ZOFRAN ) IV, mouth rinse  Vital signs in last 24 hours: Temp:  [98 F (36.7 C)-99.8 F (37.7 C)] 99.8 F (37.7 C) (05/04 0715) Pulse Rate:  [74-92] 84 (05/04 0715) Resp:  [13-22] 22 (05/04 0715) BP: (91-123)/(43-58) 102/50 (05/04 0715) SpO2:  [91 %-97 %] 91 % (05/04 0715) Weight:  [77.8 kg] 77.8 kg (05/04 0407) Last BM Date : 11/19/23  Intake/Output Summary (Last 24 hours) at 11/20/2023 1027 Last data filed at 11/20/2023 0408 Gross per 24 hour   Intake 555 ml  Output 1160 ml  Net -605 ml    Intake/Output from previous day: 05/03 0701 - 05/04 0700 In: 755 [P.O.:440; Blood:315] Out: 1160 [Urine:1160] Intake/Output this shift: No intake/output data recorded.   Physical Exam:  General: Alert female in NAD Heart:  Regular rate and rhythm.  Pulmonary: Normal respiratory effort Abdomen: Soft, nondistended, nontender. Normal bowel sounds. Extremities: 1+ BLE edema  Neurologic: Alert and oriented Psych: Pleasant. Cooperative     LOS: 12 days   Mai Schwalbe ,NP 11/20/2023, 10:27 AM

## 2023-11-21 ENCOUNTER — Inpatient Hospital Stay (HOSPITAL_COMMUNITY)

## 2023-11-21 DIAGNOSIS — D62 Acute posthemorrhagic anemia: Secondary | ICD-10-CM | POA: Diagnosis not present

## 2023-11-21 DIAGNOSIS — K31819 Angiodysplasia of stomach and duodenum without bleeding: Secondary | ICD-10-CM | POA: Diagnosis not present

## 2023-11-21 DIAGNOSIS — K7682 Hepatic encephalopathy: Secondary | ICD-10-CM

## 2023-11-21 DIAGNOSIS — K7290 Hepatic failure, unspecified without coma: Secondary | ICD-10-CM | POA: Diagnosis not present

## 2023-11-21 DIAGNOSIS — K7469 Other cirrhosis of liver: Secondary | ICD-10-CM | POA: Diagnosis not present

## 2023-11-21 DIAGNOSIS — K922 Gastrointestinal hemorrhage, unspecified: Secondary | ICD-10-CM | POA: Diagnosis not present

## 2023-11-21 DIAGNOSIS — R601 Generalized edema: Secondary | ICD-10-CM | POA: Diagnosis not present

## 2023-11-21 HISTORY — PX: IR THORACENTESIS ASP PLEURAL SPACE W/IMG GUIDE: IMG5380

## 2023-11-21 LAB — CBC WITH DIFFERENTIAL/PLATELET
Abs Immature Granulocytes: 0.02 10*3/uL (ref 0.00–0.07)
Basophils Absolute: 0 10*3/uL (ref 0.0–0.1)
Basophils Relative: 1 %
Eosinophils Absolute: 0 10*3/uL (ref 0.0–0.5)
Eosinophils Relative: 0 %
HCT: 25.5 % — ABNORMAL LOW (ref 36.0–46.0)
Hemoglobin: 8 g/dL — ABNORMAL LOW (ref 12.0–15.0)
Immature Granulocytes: 0 %
Lymphocytes Relative: 23 %
Lymphs Abs: 1.1 10*3/uL (ref 0.7–4.0)
MCH: 27 pg (ref 26.0–34.0)
MCHC: 31.4 g/dL (ref 30.0–36.0)
MCV: 86.1 fL (ref 80.0–100.0)
Monocytes Absolute: 0.6 10*3/uL (ref 0.1–1.0)
Monocytes Relative: 12 %
Neutro Abs: 3.1 10*3/uL (ref 1.7–7.7)
Neutrophils Relative %: 64 %
Platelets: 128 10*3/uL — ABNORMAL LOW (ref 150–400)
RBC: 2.96 MIL/uL — ABNORMAL LOW (ref 3.87–5.11)
RDW: 15.5 % (ref 11.5–15.5)
WBC: 4.8 10*3/uL (ref 4.0–10.5)
nRBC: 0 % (ref 0.0–0.2)

## 2023-11-21 LAB — BASIC METABOLIC PANEL WITH GFR
Anion gap: 9 (ref 5–15)
BUN: 9 mg/dL (ref 8–23)
CO2: 30 mmol/L (ref 22–32)
Calcium: 8.8 mg/dL — ABNORMAL LOW (ref 8.9–10.3)
Chloride: 99 mmol/L (ref 98–111)
Creatinine, Ser: 0.7 mg/dL (ref 0.44–1.00)
GFR, Estimated: >60 mL/min (ref >60)
Glucose, Bld: 110 mg/dL — ABNORMAL HIGH (ref 70–99)
Potassium: 3.5 mmol/L (ref 3.5–5.1)
Sodium: 138 mmol/L (ref 135–145)

## 2023-11-21 LAB — TYPE AND SCREEN
ABO/RH(D): O POS
Antibody Screen: POSITIVE
Donor AG Type: NEGATIVE
Donor AG Type: NEGATIVE
Unit division: 0
Unit division: 0

## 2023-11-21 LAB — BPAM RBC
Blood Product Expiration Date: 202505302359
Blood Product Unit Number: 202505162359
ISSUE DATE / TIME: 202505031639
PRODUCT CODE: 202505041358
Unit Type and Rh: 202505302359
Unit Type and Rh: 202505162359
Unit Type and Rh: 202505302359
Unit Type and Rh: 5100
Unit Type and Rh: 5100

## 2023-11-21 LAB — HEMOGLOBIN AND HEMATOCRIT, BLOOD
HCT: 29.2 % — ABNORMAL LOW (ref 36.0–46.0)
Hemoglobin: 9 g/dL — ABNORMAL LOW (ref 12.0–15.0)

## 2023-11-21 MED ORDER — LIDOCAINE HCL 1 % IJ SOLN
INTRAMUSCULAR | Status: AC
Start: 2023-11-21 — End: ?
  Filled 2023-11-21: qty 20

## 2023-11-21 MED ORDER — MIDODRINE HCL 5 MG PO TABS
15.0000 mg | ORAL_TABLET | Freq: Three times a day (TID) | ORAL | Status: DC
  Administered 2023-11-21 – 2023-11-23 (×9): 15 mg via ORAL
  Filled 2023-11-21 (×9): qty 3

## 2023-11-21 MED ORDER — LIDOCAINE HCL 1 % IJ SOLN
10.0000 mL | Freq: Once | INTRAMUSCULAR | Status: DC
  Filled 2023-11-21: qty 10

## 2023-11-21 MED ORDER — ALBUMIN HUMAN 25 % IV SOLN
25.0000 g | Freq: Four times a day (QID) | INTRAVENOUS | Status: AC
  Administered 2023-11-21 (×3): 25 g via INTRAVENOUS
  Filled 2023-11-21 (×3): qty 100

## 2023-11-21 NOTE — Progress Notes (Addendum)
 Patient ID: Sierra Logan, female   DOB: 04-04-1956, 68 y.o.   MRN: [id number]     Attending physician's note   I have taken a history, reviewed the chart, and examined the patient. I performed a substantive portion of this encounter, including complete performance of at least one of the key components, in conjunction with the APP. I agree with the APP's note, impression, and recommendations with my edits.   Aryanne Gilleland, DO, FACG [phone] office          Progress Note   Subjective   Day # 13 CC;Decompensated cirrhosis, GAVE- s/p APC earlier this admission  Midodrine  Lasix  twice daily 40 mg/brawl lactone 100 mg Carafate   HGB>7.3>9.2>8.0. BMET pending  Patient sitting up in the chair, says she feels okay, has been eating without difficulty.  She is unaware of any melena  Patient had thoracentesis this morning 650 cc removed from the left   Objective   Vital signs in last 24 hours: Temp:  [97.7 F (36.5 C)-98.9 F (37.2 C)] 98 F (36.7 C) (05/05 0513) Pulse Rate:  [72-79] 72 (05/04 2349) Resp:  [16-21] 16 (05/04 2349) BP: (90-112)/(42-64) 112/47 (05/05 0842) SpO2:  [93 %-98 %] 95 % (05/04 2349) Weight:  [76.8 kg] 76.8 kg (05/05 0513) Last BM Date : 11/20/23 General:   older  white female in NAD Heart:  Regular rate and rhythm; no murmurs Lungs: Respirations even and unlabored, lungs CTA bilaterally Abdomen:  Soft, nontender and nondistended, nontense ascites  Normal bowel sounds. Extremities:  2 + edema/ chronic stasis changes Neurologic:  Alert and oriented,  grossly normal neurologically. Psych:  Cooperative. Normal mood and affect.  Intake/Output from previous day: 05/04 0701 - 05/05 0700 In: 883.1 [P.O.:360; Blood:398; IV Piggyback:125.1] Out: 1900 [Urine:1900] Intake/Output this shift: No intake/output data recorded.  Lab Results: Recent Labs    11/19/23 0907 11/19/23 1227 11/20/23 0216 11/20/23 2004 11/21/23 0211  WBC 5.0  --  4.2  --   4.8  HGB 6.5*   < > 7.3* 9.2* 8.0*  HCT 21.5*   < > 23.3* 29.4* 25.5*  PLT 133*  --  123*  --  128*   < > = values in this interval not displayed.   BMET Recent Labs    11/20/23 0216  NA 136  K 3.6  CL 100  CO2 29  GLUCOSE 84  BUN 9  CREATININE 0.66  CALCIUM  8.3*   LFT Recent Labs    11/20/23 0216  PROT 6.3*  ALBUMIN  2.9*  AST 27  ALT 16  ALKPHOS 68  BILITOT 1.2   PT/INR No results for input(s): "LABPROT", "INR" in the last 72 hours.  Studies/Results: DG CHEST PORT 1 VIEW Result Date: 11/21/2023 CLINICAL DATA:  Follow-up [id number] EXAM: PORTABLE CHEST 1 VIEW COMPARISON:  Nov 18, 2023 FINDINGS: Persistent bilateral reticular interstitial infiltrates. Persistent small left pleural effusion and left lower lobe atelectasis. Heart and mediastinum normal Calcification of the annulus of the mitral valve IMPRESSION: Persistent bilateral reticular interstitial infiltrates. Persistent small left pleural effusion and left lower lobe atelectasis. Persistent pulmonary infiltrates. Edema versus pneumonia or combination of both Electronically Signed   By: Fredrich Jefferson M.D.   On: 11/21/2023 09:50   IR THORACENTESIS ASP PLEURAL SPACE W/IMG GUIDE Result Date: 11/21/2023 INDICATION: 68 year old female. History of pulmonary edema. Found to have left-sided pleural effusion. Request is for therapeutic thoracentesis EXAM: ULTRASOUND GUIDED LEFT-SIDED THERAPEUTIC THORACENTESIS MEDICATIONS: Lidocaine  1% 10 mL COMPLICATIONS: None immediate. PROCEDURE: An ultrasound  guided thoracentesis was thoroughly discussed with the patient and questions answered. The benefits, risks, alternatives and complications were also discussed. The patient understands and wishes to proceed with the procedure. Written consent was obtained. Ultrasound was performed to localize and mark an adequate pocket of fluid in the left chest. The area was then prepped and draped in the normal sterile fashion. 1% Lidocaine  was used for local  anesthesia. Under ultrasound guidance a 6 Fr Safe-T-Centesis catheter was introduced. Thoracentesis was performed. The catheter was removed and a dressing applied. FINDINGS: A total of approximately 650 mL of straw-colored fluid was removed. IMPRESSION: Successful ultrasound guided therapeutic left-sided thoracentesis yielding 650 mL of pleural fluid. Read by: Reagan Camera, NP Electronically Signed   By: Melven Stable.  Shick M.D.   On: 11/21/2023 09:39   DG Chest 1 View Result Date: 11/21/2023 CLINICAL DATA:  [id number] S/P thoracentesis [id number]. EXAM: CHEST  1 VIEW COMPARISON:  Radiograph from earlier the same day at 6 a.m. FINDINGS: Re-demonstration of left retrocardiac airspace opacity obscuring the left hemidiaphragm, descending thoracic aorta and blunting the left lateral costophrenic angle, suggesting combination of left lung atelectasis and/or consolidation with pleural effusion. There is interval decrease in the amount of left pleural effusion, status post thoracentesis. No left pneumothorax seen. Bilateral lung fields are otherwise clear. Right lateral costophrenic angle is clear. Stable cardio-mediastinal silhouette. Mitral annulus calcifications noted. No acute osseous abnormalities. The soft tissues are within normal limits. IMPRESSION: *Interval decrease in the amount of left pleural effusion, status post thoracentesis. No pneumothorax. *Persistent left retrocardiac opacity, as described above. Electronically Signed   By: Beula Brunswick M.D.   On: 11/21/2023 09:16       Assessment / Plan:    #5 68 year old female with decompensated cirrhosis secondary to PBC admitted 13 days ago with progressive edema, ascites, encephalopathy and anemia.  At EGD on 11/07/2023 she was found to have GAVE and this was treated with APC, also noted small nonbleeding esophageal varices. Unable to tolerate Coreg  with fatigue and hypotension.  Patient has been noted to have drifting hemoglobin again over the past couple of  days-black stool noted on Saturday, patient says she does not feel that her stool was black last evening, no bowel movement today  Hemoglobin 8.0 today down from 9.2 posttransfusion yesterday x 1  #2 anasarca-continues on Lasix  40 twice daily and Aldactone  100 mg daily-continues to diurese #3 large left pleural effusion/status post thoracentesis this a.m. with 650 cc removed #4 diabetes mellitus #5 hepatic encephalopathy-stable - not currently on rx  Plan; no procedure today continue to trend hemoglobin if hemoglobin continues to drift and she requires further transfusion can consider for repeat EGD with APC in the next day or 2.    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

## 2023-11-21 NOTE — Procedures (Signed)
 Ultrasound-guided therapeutic  left sided thoracentesis performed yielding 650 milliters of straw colored fluid. No immediate complications. . Follow-up chest x-ray pending. EBL is < 2 ml.

## 2023-11-21 NOTE — Progress Notes (Signed)
 Mobility Specialist Progress Note;   11/21/23 1454  Mobility  Activity Ambulated with assistance in hallway  Level of Assistance Contact guard assist, steadying assist  Assistive Device Front wheel walker  Distance Ambulated (ft) 200 ft  Activity Response Tolerated well  Mobility Referral Yes  Mobility visit 1 Mobility  Mobility Specialist Start Time (ACUTE ONLY) 1454  Mobility Specialist Stop Time (ACUTE ONLY) 1515  Mobility Specialist Time Calculation (min) (ACUTE ONLY) 21 min    Post-mobility: BP 117/52 (69)  Pt agreeable to mobility, on BSC upon arrival. BM successful. Required MinG assistance during ambulation for safety. Ambulated on 3LO2 to maintain SPO2 88%>. No c/o when asked. BP taken post mobility (see above). Pt returned back to bed with all needs met, alarm on.   Janit Meline Mobility Specialist Please contact via SecureChat or [employer] [phone]

## 2023-11-21 NOTE — Progress Notes (Signed)
 [facility]                                                                               Sierra Logan, is a 68 y.o. female, DOB - [birth year], IHK:[id number] Admit date - 11/07/2023    Outpatient Primary MD for the patient is Adelia Homestead, MD  LOS - 13  days    Brief summary    68 year old with history of hypertension, hyperlipidemia, type 2 diabetes, history of cirrhosis with esophageal varices presented with swelling of legs and feet, melanotic black stool.  Ongoing for about a month.  In the emergency room patient was relatively stable.  Hemoglobin 10.3 with recent hemoglobin of 11.3.  Chest x-ray with moderate severity left basilar atelectasis or infiltrate, moderate-sized left pleural effusion.  FOBT positive.  Patient was admitted with anasarca secondary to liver disease and multiple other complex issues, details below".    11/19/2023: Patient seen alongside patient's husband.  Hemoglobin dropped to 6.5 g/dL at 7 g/dL.  Patient had EGD on 11/09/2023 that revealed bleeding gastric antral vascular ectasia.  GI team has been reconsulted.  Assessment & Plan    Assessment and Plan:   Fluid overload secondary to decompensated PBC cirrhosis and anasarca/orthostatic hypotension:  Volume overloaded, on lasix  40 mg daily, transitioned to BID dosing.  - due to ongoing orthostatic hypotension, placed on midodrine  15 mg 3 times daily - 2D echo showed EF of 60 to 65%, no regional WMA, pleural effusion on the left side. - paracentesis on 4/24, 1.8 L transudate removed.  Negative for SBP.   - she was found to have large pleural effusion, US  thoracentesis ordered.  - will monitor her on BID dosing of lasix   and spironolactone  100 mg daily. and monitor renal parameters.     ABLA, history of chronic microcytic anemia due to upper GI bleed from cirrhosis  - s/p EGD , grade 2 varices in the lower third of esophagus without evidence of bleeding.  Gastric antral vascular ectasia  with bleeding was present.  Cauterized.    Recent EGD on 11/09/2023 that revealed esophageal varices and gastric antral vascular antral ectasia with bleeding.  GI on board. Recommended supportive care with BID PPI and sucralfate .  After the unit of prbc yestrday, hemoglobin improved to 9 then dropped to 8 today.  Check H&H in am and if trending down, GI plans for EGD in a day or two.     Small to moderate left pleural effusion, abnormal chest x-ray  US  thoracentesis ordered. And 650 ml fluid removed.  Repeat CXR shows improvement without any pneumothorax.  Monitor.    Diabetes mellitus type 2, controlled  - Hemoglobin A1c 4.8, Diet controlled.    Hyperlipidemia: - On a statin.  LFTs are normal.   History of hepatic encephalopathy - Currently stable, alert and oriented   Obesity class I Estimated body mass index is 32.38 kg/m as calculated from the following:   Height as of this encounter: 5\' 2"  (1.575 m).   Weight as of this encounter: 80.3 kg.  Estimated body mass index is 30.97 kg/m as calculated from the following:   Height as of this encounter: 5'  2" (1.575 m).   Weight as of this encounter: 76.8 kg.  Code Status: full code.  DVT Prophylaxis:  SCDs Start: 11/08/23 0853 SCDs Start: 11/08/23 0427   Level of Care: Level of care: Progressive Family Communication: none at bedside.   Disposition Plan:     Remains inpatient appropriate:   Diuresis, monitor hemoglobin.   Procedures:  None.   Consultants:   Gastroenterology.   Antimicrobials:   Anti-infectives (From admission, onward)    Start     Dose/Rate Route Frequency Ordered Stop   11/08/23 1445  cefTRIAXone  (ROCEPHIN ) 1 g in sodium chloride  0.9 % 100 mL IVPB  Status:  Discontinued        1 g 200 mL/hr over 30 Minutes Intravenous Every 24 hours 11/08/23 1442 11/11/23 0948        Medications  Scheduled Meds:  sodium chloride    Intravenous Once   sodium chloride    Intravenous Once   atorvastatin   10 mg  Oral QHS   furosemide   40 mg Oral BID   gabapentin   400 mg Oral Daily   And   gabapentin   800 mg Oral QHS   lidocaine   10 mL Intradermal Once   liver oil-zinc  oxide   Topical TID   midodrine   15 mg Oral TID WC   pantoprazole   40 mg Oral BID   sodium chloride  flush  3 mL Intravenous Q12H   spironolactone   100 mg Oral Daily   sucralfate   1 g Oral TID WC & HS   traZODone   50 mg Oral QHS   Continuous Infusions: PRN Meds:.acetaminophen  **OR** acetaminophen , albuterol , melatonin, morphine  injection, ondansetron  (ZOFRAN ) IV, mouth rinse, white petrolatum    Subjective:   Sierra Logan was seen and examined today.  No new complaints , no dizziness.   Objective:   Vitals:   11/20/23 2349 11/21/23 0513 11/21/23 0842 11/21/23 1118  BP: (!) 90/42 (!) 99/49 (!) 112/47 (!) 105/50  Pulse: 72     Resp: 16     Temp: 98 F (36.7 C) 98 F (36.7 C)    TempSrc: Oral Oral    SpO2: 95%     Weight:  76.8 kg    Height:        Intake/Output Summary (Last 24 hours) at 11/21/2023 1217 Last data filed at 11/21/2023 1107 Gross per 24 hour  Intake 883.14 ml  Output 2100 ml  Net -1216.86 ml   Filed Weights   11/19/23 0350 11/20/23 0407 11/21/23 0513  Weight: 78.9 kg 77.8 kg 76.8 kg     Exam General exam: Appears calm and comfortable  Respiratory system: Clear to auscultation. Respiratory effort normal. Cardiovascular system: S1 & S2 heard, RRR. No JVD, Gastrointestinal system: Abdomen is nondistended, soft and nontender. Central nervous system: Alert and oriented. No focal neurological deficits. Extremities: improving edema.  Skin: No rashes,  Psychiatry:  Mood & affect appropriate.     Data Reviewed:  I have personally reviewed following labs and imaging studies   CBC Lab Results  Component Value Date   WBC 4.8 11/21/2023   RBC 2.96 (L) 11/21/2023   HGB 8.0 (L) 11/21/2023   HCT 25.5 (L) 11/21/2023   MCV 86.1 11/21/2023   MCH 27.0 11/21/2023   PLT 128 (L) 11/21/2023   MCHC  31.4 11/21/2023   RDW 15.5 11/21/2023   LYMPHSABS 1.1 11/21/2023   MONOABS 0.6 11/21/2023   EOSABS 0.0 11/21/2023   BASOSABS 0.0 11/21/2023     Last metabolic panel Lab Results  Component Value Date   NA 138 11/21/2023   K 3.5 11/21/2023   CL 99 11/21/2023   CO2 30 11/21/2023   BUN 9 11/21/2023   CREATININE 0.70 11/21/2023   GLUCOSE 110 (H) 11/21/2023   GFRNONAA >60 11/21/2023   CALCIUM  8.8 (L) 11/21/2023   PHOS 3.6 11/10/2023   PROT 6.3 (L) 11/20/2023   ALBUMIN  2.9 (L) 11/20/2023   BILITOT 1.2 11/20/2023   ALKPHOS 68 11/20/2023   AST 27 11/20/2023   ALT 16 11/20/2023   ANIONGAP 9 11/21/2023    CBG (last 3)  No results for input(s): "GLUCAP" in the last 72 hours.    Coagulation Profile: No results for input(s): "INR", "PROTIME" in the last 168 hours.   Radiology Studies: DG CHEST PORT 1 VIEW Result Date: 11/21/2023 CLINICAL DATA:  Follow-up [id number] EXAM: PORTABLE CHEST 1 VIEW COMPARISON:  Nov 18, 2023 FINDINGS: Persistent bilateral reticular interstitial infiltrates. Persistent small left pleural effusion and left lower lobe atelectasis. Heart and mediastinum normal Calcification of the annulus of the mitral valve IMPRESSION: Persistent bilateral reticular interstitial infiltrates. Persistent small left pleural effusion and left lower lobe atelectasis. Persistent pulmonary infiltrates. Edema versus pneumonia or combination of both Electronically Signed   By: Fredrich Jefferson M.D.   On: 11/21/2023 09:50   IR THORACENTESIS ASP PLEURAL SPACE W/IMG GUIDE Result Date: 11/21/2023 INDICATION: 68 year old female. History of pulmonary edema. Found to have left-sided pleural effusion. Request is for therapeutic thoracentesis EXAM: ULTRASOUND GUIDED LEFT-SIDED THERAPEUTIC THORACENTESIS MEDICATIONS: Lidocaine  1% 10 mL COMPLICATIONS: None immediate. PROCEDURE: An ultrasound guided thoracentesis was thoroughly discussed with the patient and questions answered. The benefits, risks, alternatives  and complications were also discussed. The patient understands and wishes to proceed with the procedure. Written consent was obtained. Ultrasound was performed to localize and mark an adequate pocket of fluid in the left chest. The area was then prepped and draped in the normal sterile fashion. 1% Lidocaine  was used for local anesthesia. Under ultrasound guidance a 6 Fr Safe-T-Centesis catheter was introduced. Thoracentesis was performed. The catheter was removed and a dressing applied. FINDINGS: A total of approximately 650 mL of straw-colored fluid was removed. IMPRESSION: Successful ultrasound guided therapeutic left-sided thoracentesis yielding 650 mL of pleural fluid. Read by: Reagan Camera, NP Electronically Signed   By: Melven Stable.  Shick M.D.   On: 11/21/2023 09:39   DG Chest 1 View Result Date: 11/21/2023 CLINICAL DATA:  [id number] S/P thoracentesis [id number]. EXAM: CHEST  1 VIEW COMPARISON:  Radiograph from earlier the same day at 6 a.m. FINDINGS: Re-demonstration of left retrocardiac airspace opacity obscuring the left hemidiaphragm, descending thoracic aorta and blunting the left lateral costophrenic angle, suggesting combination of left lung atelectasis and/or consolidation with pleural effusion. There is interval decrease in the amount of left pleural effusion, status post thoracentesis. No left pneumothorax seen. Bilateral lung fields are otherwise clear. Right lateral costophrenic angle is clear. Stable cardio-mediastinal silhouette. Mitral annulus calcifications noted. No acute osseous abnormalities. The soft tissues are within normal limits. IMPRESSION: *Interval decrease in the amount of left pleural effusion, status post thoracentesis. No pneumothorax. *Persistent left retrocardiac opacity, as described above. Electronically Signed   By: Beula Brunswick M.D.   On: 11/21/2023 09:16       Feliciana Horn M.D. [facility] 11/21/2023, 12:17 PM  Available via Epic secure chat 7am-7pm After 7 pm,  please refer to night coverage provider listed on amion.

## 2023-11-21 NOTE — Progress Notes (Signed)
 Pt's BP 90/42 MAP 55, no complaints of dizziness, A&O x4; attempted to notify Hall, DO. See new orders.  Sonjia Durie, RN

## 2023-11-21 NOTE — TOC Progression Note (Signed)
 Transition of Care [facility]) - Progression Note    Patient Details  Name: Sierra Logan MRN: [id number] Date of Birth: Mar 28, 1956  [facility]) CM/SW Contact  Juliane Och, LCSW Phone Number: 11/21/2023, 9:50 AM  Clinical Narrative:     9:50 AM Per hospitalist, patient is yet medically ready to discharge to [facility]. CSW relayed information to SNF.   Expected Discharge Plan: Skilled Nursing Facility Barriers to Discharge: Barriers Resolved  Expected Discharge Plan and Services In-house Referral: Clinical Social Work   Post Acute Care Choice: Skilled Nursing Facility Living arrangements for the past 2 months: Single Family Home Expected Discharge Date: 11/18/23                                     Social Determinants of Health (SDOH) Interventions SDOH Screenings   Food Insecurity: No Food Insecurity (11/08/2023)  Housing: Low Risk  (11/08/2023)  Transportation Needs: No Transportation Needs (11/08/2023)  Utilities: Not At Risk (11/08/2023)  Depression (PHQ2-9): Low Risk  (10/27/2023)  Social Connections: Moderately Isolated (11/08/2023)  Tobacco Use: High Risk (11/09/2023)    Readmission Risk Interventions     No data to display

## 2023-11-21 NOTE — Progress Notes (Signed)
 Physical Therapy Treatment Patient Details Name: Sierra Logan MRN: [id number] DOB: 1956/03/20 Today's Date: 11/21/2023   History of Present Illness 68 year old presented 11/07/23 with swelling of legs, feet, stomach and black stool. Chest x-ray with moderate severity left basilar atelectasis or infiltrate, moderate-sized left pleural effusion. +anasarca; +GI bleed; 4/23 EGD with esophageal varices; 4/24 paracentesis; persistent hypotension; 5/5 L thoracentesis  PMH  hypertension, hyperlipidemia, type 2 diabetes, history of cirrhosis with esophageal varices    PT Comments  Patient eager to participate on arrival. Now back on 2.5L of O2 with sats 97% at rest (no good pleth/reading while ambulating). BP 100/57 (70) in sitting with pt denying dizziness. Patient able to increase ambulation distance to 120 ft with RW. Returned to room to use restroom and rest with plans to ambulate a second time, however RN arrived with pt's morning meds and pt preferred to end session and take her meds. May try step training with single step in her room on next visit.     If plan is discharge home, recommend the following: A little help with walking and/or transfers;A little help with bathing/dressing/bathroom;Assistance with cooking/housework;Assist for transportation;Help with stairs or ramp for entrance   Can travel by private vehicle     No  Equipment Recommendations  Other (comment) (TBD; pt reports house too small for use of RW)    Recommendations for Other Services       Precautions / Restrictions Precautions Precautions: Fall Recall of Precautions/Restrictions: Intact Restrictions Weight Bearing Restrictions Per Provider Order: No     Mobility  Bed Mobility Overal bed mobility: Needs Assistance Bed Mobility: Supine to Sit     Supine to sit: Supervision     General bed mobility comments: supervision for safety due to recent BP issues    Transfers Overall transfer level: Needs  assistance Equipment used: None, Standard walker Transfers: Sit to/from Stand, Bed to chair/wheelchair/BSC Sit to Stand: Contact guard assist   Step pivot transfers: Contact guard assist       General transfer comment: CGA for safety on rise from EOB and then BSC, Stood from [facility] to chair without device    Ambulation/Gait Ambulation/Gait assistance: Contact guard assist, Min assist Gait Distance (Feet): 120 Feet Assistive device: None, Rolling walker (2 wheels) Gait Pattern/deviations: Step-through pattern, Decreased stride length Gait velocity: decr     General Gait Details: Initiated with RW with plan to walk a second time after using BSC. RN in to administer meds and pt deferred walking so she could take her meds.   Stairs             Wheelchair Mobility     Tilt Bed    Modified Rankin (Stroke Patients Only)       Balance Overall balance assessment: Needs assistance Sitting-balance support: No upper extremity supported, Feet supported Sitting balance-Leahy Scale: Good     Standing balance support: No upper extremity supported Standing balance-Leahy Scale: Fair Standing balance comment: pt benefited from RW support with gait                            Communication Communication Communication: Impaired Factors Affecting Communication: Hearing impaired  Cognition Arousal: Alert Behavior During Therapy: WFL for tasks assessed/performed   PT - Cognitive impairments: No apparent impairments                       PT - Cognition Comments: a&ox4; Following commands: Intact  Cueing Cueing Techniques: Verbal cues  Exercises      General Comments        Pertinent Vitals/Pain Pain Assessment Pain Assessment: Faces Faces Pain Scale: Hurts little more Pain Location: bil lower legs Pain Descriptors / Indicators: Burning Pain Intervention(s): Limited activity within patient's tolerance    Home Living                           Prior Function            PT Goals (current goals can now be found in the care plan section) Acute Rehab PT Goals Patient Stated Goal: get better Time For Goal Achievement: 11/24/23 Potential to Achieve Goals: Good Progress towards PT goals: Progressing toward goals    Frequency    Min 2X/week      PT Plan      Co-evaluation              AM-PAC PT "6 Clicks" Mobility   Outcome Measure  Help needed turning from your back to your side while in a flat bed without using bedrails?: None Help needed moving from lying on your back to sitting on the side of a flat bed without using bedrails?: A Little Help needed moving to and from a bed to a chair (including a wheelchair)?: A Little Help needed standing up from a chair using your arms (e.g., wheelchair or bedside chair)?: A Little Help needed to walk in hospital room?: A Little Help needed climbing 3-5 steps with a railing? : Total 6 Click Score: 17    End of Session Equipment Utilized During Treatment: Gait belt;Oxygen Activity Tolerance: Patient limited by fatigue Patient left: with call bell/phone within reach;with nursing/sitter in room;in chair (Lab in room) Nurse Communication: Mobility status PT Visit Diagnosis: Other abnormalities of gait and mobility (R26.89);Repeated falls (R29.6);Muscle weakness (generalized) (M62.81);Dizziness and giddiness (R42)     Time: 3086-5784 PT Time Calculation (min) (ACUTE ONLY): 25 min  Charges:    $Gait Training: 23-37 mins PT General Charges $$ ACUTE PT VISIT: 1 Visit                      Gayle Kava, PT Acute Rehabilitation Services  Office [phone]    Guilford Leep 11/21/2023, 11:26 AM

## 2023-11-21 NOTE — Plan of Care (Signed)
  Problem: Education: Goal: Knowledge of General Education information will improve Description: Including pain rating scale, medication(s)/side effects and non-pharmacologic comfort measures Outcome: Progressing   Problem: Clinical Measurements: Goal: Will remain free from infection Outcome: Progressing   Problem: Activity: Goal: Risk for activity intolerance will decrease Outcome: Progressing   Problem: Nutrition: Goal: Adequate nutrition will be maintained Outcome: Progressing   Problem: Coping: Goal: Level of anxiety will decrease Outcome: Progressing   Problem: Safety: Goal: Ability to remain free from injury will improve Outcome: Progressing   

## 2023-11-22 LAB — BASIC METABOLIC PANEL WITH GFR
Anion gap: 8 (ref 5–15)
BUN: 9 mg/dL (ref 8–23)
CO2: 28 mmol/L (ref 22–32)
Calcium: 8.9 mg/dL (ref 8.9–10.3)
Chloride: 100 mmol/L (ref 98–111)
Creatinine, Ser: 0.64 mg/dL (ref 0.44–1.00)
GFR, Estimated: >60 mL/min (ref >60)
Glucose, Bld: 106 mg/dL — ABNORMAL HIGH (ref 70–99)
Potassium: 3.5 mmol/L (ref 3.5–5.1)
Sodium: 136 mmol/L (ref 135–145)

## 2023-11-22 LAB — CBC WITH DIFFERENTIAL/PLATELET
Abs Immature Granulocytes: 0.02 10*3/uL (ref 0.00–0.07)
Basophils Absolute: 0 10*3/uL (ref 0.0–0.1)
Basophils Relative: 1 %
Eosinophils Absolute: 0 10*3/uL (ref 0.0–0.5)
Eosinophils Relative: 0 %
HCT: 28.1 % — ABNORMAL LOW (ref 36.0–46.0)
Hemoglobin: 8.9 g/dL — ABNORMAL LOW (ref 12.0–15.0)
Immature Granulocytes: 0 %
Lymphocytes Relative: 18 %
Lymphs Abs: 1 10*3/uL (ref 0.7–4.0)
MCH: 27.6 pg (ref 26.0–34.0)
MCHC: 31.7 g/dL (ref 30.0–36.0)
MCV: 87 fL (ref 80.0–100.0)
Monocytes Absolute: 0.4 10*3/uL (ref 0.1–1.0)
Monocytes Relative: 8 %
Neutro Abs: 4 10*3/uL (ref 1.7–7.7)
Neutrophils Relative %: 73 %
Platelets: 154 10*3/uL (ref 150–400)
RBC: 3.23 MIL/uL — ABNORMAL LOW (ref 3.87–5.11)
RDW: 15.7 % — ABNORMAL HIGH (ref 11.5–15.5)
WBC: 5.5 10*3/uL (ref 4.0–10.5)
nRBC: 0 % (ref 0.0–0.2)

## 2023-11-22 LAB — HEMOGLOBIN AND HEMATOCRIT, BLOOD
HCT: 27.9 % — ABNORMAL LOW (ref 36.0–46.0)
Hemoglobin: 8.5 g/dL — ABNORMAL LOW (ref 12.0–15.0)

## 2023-11-22 NOTE — Progress Notes (Signed)
 Mobility Specialist Progress Note;   11/22/23 0933  Mobility  Activity Ambulated with assistance in hallway  Level of Assistance Standby assist, set-up cues, supervision of patient - no hands on  Assistive Device Front wheel walker  Distance Ambulated (ft) 175 ft  Activity Response Tolerated well  Mobility Referral Yes  Mobility visit 1 Mobility  Mobility Specialist Start Time (ACUTE ONLY) [id number]  Mobility Specialist Stop Time (ACUTE ONLY) 0930  Mobility Specialist Time Calculation (min) (ACUTE ONLY) 12 min   Pt agreeable to mobility. Required no physical assistance during ambulation, SV. Ambulated on 3LO2, VSS throughout. Pt c/o feeling a bit fatigued this session, limiting distance. Pt returned back to bed with all needs met, call bell in reach.   Janit Meline Mobility Specialist Please contact via SecureChat or [employer] [phone]

## 2023-11-22 NOTE — Progress Notes (Signed)
 Occupational Therapy Treatment Patient Details Name: Sierra Logan MRN: [id number] DOB: 12-05-1955 Today's Date: 11/22/2023   History of present illness 68 year old presented 11/07/23 with swelling of legs, feet, stomach and black stool. Chest x-ray with moderate severity left basilar atelectasis or infiltrate, moderate-sized left pleural effusion. +anasarca; +GI bleed; 4/23 EGD with esophageal varices; 4/24 paracentesis; persistent hypotension; 5/5 L thoracentesis  PMH  hypertension, hyperlipidemia, type 2 diabetes, history of cirrhosis with esophageal varices   OT comments  Patient continues to make good gains with OT treatment. Patient able to get to EOB with supervision and required max assist to donn socks. Patient performed transfer to [facility] with CGA and no AD and was able to stand for toilet hygiene with CGA for safety. Patient ambulated short distance to sink for grooming tasks and then to EOB for breakfast. Patient will benefit from continued inpatient follow up therapy, <3 hours/day. Acute OT to continue to follow to address established goals to facilitate DC to next venue of care.       If plan is discharge home, recommend the following:  Assistance with cooking/housework;Assist for transportation   Equipment Recommendations  Other (comment) (defer)    Recommendations for Other Services      Precautions / Restrictions Precautions Precautions: Fall Recall of Precautions/Restrictions: Intact Restrictions Weight Bearing Restrictions Per Provider Order: No       Mobility Bed Mobility Overal bed mobility: Needs Assistance Bed Mobility: Supine to Sit     Supine to sit: Supervision, HOB elevated, Used rails     General bed mobility comments: able to get to EOB with use of bed rail and supervisoin    Transfers Overall transfer level: Needs assistance Equipment used: Rolling walker (2 wheels), None Transfers: Sit to/from Stand, Bed to chair/wheelchair/BSC Sit to Stand:  Contact guard assist     Step pivot transfers: Contact guard assist     General transfer comment: transfer to [facility], chair, and EOB     Balance Overall balance assessment: Needs assistance Sitting-balance support: No upper extremity supported, Feet supported Sitting balance-Leahy Scale: Good     Standing balance support: No upper extremity supported Standing balance-Leahy Scale: Fair Standing balance comment: able to stand for grooming and toilet hygiene with no UE support and supervision to CGA                           ADL either performed or assessed with clinical judgement   ADL Overall ADL's : Needs assistance/impaired     Grooming: Wash/dry hands;Wash/dry face;Oral care;Brushing hair;Supervision/safety;Standing Grooming Details (indicate cue type and reason): at sink             Lower Body Dressing: Sitting/lateral leans;Maximal assistance Lower Body Dressing Details (indicate cue type and reason): to donn socks Toilet Transfer: Contact guard assist;Ambulation;BSC/3in1 Toilet Transfer Details (indicate cue type and reason): transfer from EOB to [facility] to chair with CGA Toileting- Clothing Manipulation and Hygiene: Contact guard assist;Supervision/safety;Sit to/from stand Toileting - Clothing Manipulation Details (indicate cue type and reason): patient able to perform toilet hygiene standing with CGA for safety       General ADL Comments: Slight LOB while transfer to [facility] but able to steady self    Extremity/Trunk Assessment Upper Extremity Assessment Upper Extremity Assessment: Generalized weakness            Vision       Perception     Praxis     Communication Communication Communication: Impaired Factors  Affecting Communication: Hearing impaired   Cognition Arousal: Alert Behavior During Therapy: WFL for tasks assessed/performed                                 Following commands: Intact        Cueing   Cueing  Techniques: Verbal cues  Exercises      Shoulder Instructions       General Comments      Pertinent Vitals/ Pain       Pain Assessment Pain Assessment: Faces Faces Pain Scale: Hurts a little bit Pain Location: bil lower legs Pain Descriptors / Indicators: Grimacing, Sore Pain Intervention(s): Limited activity within patient's tolerance, Monitored during session, Repositioned  Home Living                                          Prior Functioning/Environment              Frequency  Min 2X/week        Progress Toward Goals  OT Goals(current goals can now be found in the care plan section)  Progress towards OT goals: Progressing toward goals  Acute Rehab OT Goals Patient Stated Goal: go to rehab OT Goal Formulation: With patient Time For Goal Achievement: 11/24/23 Potential to Achieve Goals: Good ADL Goals Pt Will Perform Grooming: with supervision;standing Pt Will Perform Lower Body Dressing: with supervision;sit to/from stand Pt Will Transfer to Toilet: with supervision;ambulating Pt Will Perform Toileting - Clothing Manipulation and hygiene: with supervision;sit to/from stand  Plan      Co-evaluation                 AM-PAC OT "6 Clicks" Daily Activity     Outcome Measure   Help from another person eating meals?: None Help from another person taking care of personal grooming?: A Little Help from another person toileting, which includes using toliet, bedpan, or urinal?: A Little Help from another person bathing (including washing, rinsing, drying)?: A Little Help from another person to put on and taking off regular upper body clothing?: A Little Help from another person to put on and taking off regular lower body clothing?: A Lot 6 Click Score: 18    End of Session Equipment Utilized During Treatment: Rolling walker (2 wheels);Oxygen  OT Visit Diagnosis: Unsteadiness on feet (R26.81);Other abnormalities of gait and mobility  (R26.89);Muscle weakness (generalized) (M62.81)   Activity Tolerance Patient tolerated treatment well   Patient Left in chair;with call bell/phone within reach;with bed alarm set;Other (comment) (left on EOB with breakfast)   Nurse Communication Mobility status        Time: 4098-1191 OT Time Calculation (min): 23 min  Charges: OT General Charges $OT Visit: 1 Visit OT Treatments $Self Care/Home Management : 23-37 mins  Anitra Barn, OTA Acute Rehabilitation Services  Office [phone]   Jovita Nipper 11/22/2023, 7:56 AM

## 2023-11-22 NOTE — TOC Progression Note (Signed)
 Transition of Care [facility]) - Progression Note    Patient Details  Name: Sierra Logan MRN: [id number] Date of Birth: Jun 28, 1956  [facility] CM/SW Contact  Juliane Och, LCSW Phone Number: 11/22/2023, 1:12 PM  Clinical Narrative:     1:12 PM Per progressions, patient is not yet medically ready to discharge. CSW made SNF aware.   Expected Discharge Plan: Skilled Nursing Facility Barriers to Discharge: Barriers Resolved  Expected Discharge Plan and Services In-house Referral: Clinical Social Work   Post Acute Care Choice: Skilled Nursing Facility Living arrangements for the past 2 months: Single Family Home Expected Discharge Date: 11/18/23                                     Social Determinants of Health (SDOH) Interventions SDOH Screenings   Food Insecurity: No Food Insecurity (11/08/2023)  Housing: Low Risk  (11/08/2023)  Transportation Needs: No Transportation Needs (11/08/2023)  Utilities: Not At Risk (11/08/2023)  Depression (PHQ2-9): Low Risk  (10/27/2023)  Social Connections: Moderately Isolated (11/08/2023)  Tobacco Use: High Risk (11/09/2023)    Readmission Risk Interventions     No data to display

## 2023-11-22 NOTE — Plan of Care (Signed)
°  Problem: Clinical Measurements: Goal: Will remain free from infection Outcome: Progressing   Problem: Activity: Goal: Risk for activity intolerance will decrease Outcome: Progressing   Problem: Nutrition: Goal: Adequate nutrition will be maintained Outcome: Progressing

## 2023-11-22 NOTE — Plan of Care (Signed)

## 2023-11-22 NOTE — Progress Notes (Signed)
 [facility]                                                                               Sierra Logan, is a 68 y.o. female, DOB - 11-Jan-1956, ZOX:[id number] Admit date - 11/07/2023    Outpatient Primary MD for the patient is Adelia Homestead, MD  LOS - 14  days    Brief summary    68 year old with history of hypertension, hyperlipidemia, type 2 diabetes, history of cirrhosis with esophageal varices presented with swelling of legs and feet, melanotic black stool.  Ongoing for about a month.  In the emergency room patient was relatively stable.  Hemoglobin 10.3 with recent hemoglobin of 11.3.  Chest x-ray with moderate severity left basilar atelectasis or infiltrate, moderate-sized left pleural effusion.  FOBT positive.  Patient was admitted with anasarca secondary to liver disease and multiple other complex issues, details below".    11/19/2023: Patient seen alongside patient's husband.  Hemoglobin dropped to 6.5 g/dL at 7 g/dL.  Patient had EGD on 11/09/2023 that revealed bleeding gastric antral vascular ectasia.  GI team has been reconsulted, recommended supportive care.   Assessment & Plan    Assessment and Plan:   Fluid overload secondary to decompensated PBC cirrhosis and anasarca/orthostatic hypotension:  Volume overloaded, on lasix  40 mg daily, transitioned to BID dosing. Improving leg edema.  - due to ongoing orthostatic hypotension, placed on midodrine  15 mg 3 times daily - 2D echo showed EF of 60 to 65%, no regional WMA, pleural effusion on the left side. - paracentesis on 4/24, 1.8 L transudate removed.  Negative for SBP.   - she was found to have large pleural effusion, US  thoracentesis ordered, 650 ml of fluid removed.  - will monitor her on BID dosing of lasix   and spironolactone  100 mg daily. and monitor renal parameters.     ABLA, history of chronic microcytic anemia due to upper GI bleed from cirrhosis  - s/p EGD , grade 2 varices in the lower third  of esophagus without evidence of bleeding.  Gastric antral vascular ectasia with bleeding was present.  Cauterized.    Recent EGD on 11/09/2023 that revealed esophageal varices and gastric antral vascular antral ectasia with bleeding.   GI recommended supportive care with BID PPI and sucralfate .  - s/p 1 unit of prbc transfusion on 5/4. Transfuse to keep hemoglobin greater than 8.  GI on board and await recommendations.    Small to moderate left pleural effusion, abnormal chest x-ray  US  thoracentesis on 5/5 and 650 ml fluid removed.  Repeat CXR shows improvement without any pneumothorax.  Monitor.    Diabetes mellitus type 2, controlled  - Hemoglobin A1c 4.8, Diet controlled.    Hyperlipidemia: - On a statin.  LFTs are normal.   History of hepatic encephalopathy - Currently stable, alert and oriented   Obesity class I Estimated body mass index is 32.38 kg/m as calculated from the following:   Height as of this encounter: 5\' 2"  (1.575 m).   Weight as of this encounter: 80.3 kg.  Estimated body mass index is 30.45 kg/m as calculated from the following:   Height as of this encounter:  5\' 2"  (1.575 m).   Weight as of this encounter: 75.5 kg.  Code Status: full code.  DVT Prophylaxis:  SCDs Start: 11/08/23 0853 SCDs Start: 11/08/23 0427   Level of Care: Level of care: Progressive Family Communication: none at bedside.   Disposition Plan:     Remains inpatient appropriate:   Diuresis, monitor hemoglobin.   Procedures:  EGD US  thoracentesis.   Consultants:   Gastroenterology.   Antimicrobials:   Anti-infectives (From admission, onward)    Start     Dose/Rate Route Frequency Ordered Stop   11/08/23 1445  cefTRIAXone  (ROCEPHIN ) 1 g in sodium chloride  0.9 % 100 mL IVPB  Status:  Discontinued        1 g 200 mL/hr over 30 Minutes Intravenous Every 24 hours 11/08/23 1442 11/11/23 0948        Medications  Scheduled Meds:  atorvastatin   10 mg Oral QHS   furosemide    40 mg Oral BID   gabapentin   400 mg Oral Daily   And   gabapentin   800 mg Oral QHS   lidocaine   10 mL Intradermal Once   liver oil-zinc  oxide   Topical TID   midodrine   15 mg Oral TID WC   pantoprazole   40 mg Oral BID   sodium chloride  flush  3 mL Intravenous Q12H   spironolactone   100 mg Oral Daily   sucralfate   1 g Oral TID WC & HS   traZODone   50 mg Oral QHS   Continuous Infusions: PRN Meds:.acetaminophen  **OR** acetaminophen , albuterol , melatonin, morphine  injection, ondansetron  (ZOFRAN ) IV, mouth rinse, white petrolatum    Subjective:   Sierra Logan was seen and examined today.   No new complaints today.   Objective:   Vitals:   11/22/23 0622 11/22/23 0737 11/22/23 1200 11/22/23 1709  BP: (!) 95/49 (!) 101/38 (!) 94/48 (!) 87/73  Pulse: 86 78 65 72  Resp: 16 14 16 15   Temp: 98.2 F (36.8 C) 97.8 F (36.6 C) 97.8 F (36.6 C) 98.1 F (36.7 C)  TempSrc: Oral Axillary Oral Oral  SpO2: 96% 95% 98% 96%  Weight: 75.5 kg     Height:        Intake/Output Summary (Last 24 hours) at 11/22/2023 1816 Last data filed at 11/22/2023 0550 Gross per 24 hour  Intake 120 ml  Output --  Net 120 ml   Filed Weights   11/20/23 0407 11/21/23 0513 11/22/23 0622  Weight: 77.8 kg 76.8 kg 75.5 kg     Exam General exam: Appears calm and comfortable  Respiratory system: Clear to auscultation. Respiratory effort normal. Cardiovascular system: S1 & S2 heard, RRR. No JVD,  Gastrointestinal system: Abdomen is soft, non tender. Bs+ Central nervous system: Alert and oriented. No focal neurological deficits. Extremities: pedal edema improving.  Skin: No rashes,  Psychiatry:  Mood & affect appropriate.      Data Reviewed:  I have personally reviewed following labs and imaging studies   CBC Lab Results  Component Value Date   WBC 5.5 11/22/2023   RBC 3.23 (L) 11/22/2023   HGB 8.5 (L) 11/22/2023   HCT 27.9 (L) 11/22/2023   MCV 87.0 11/22/2023   MCH 27.6 11/22/2023   PLT 154  11/22/2023   MCHC 31.7 11/22/2023   RDW 15.7 (H) 11/22/2023   LYMPHSABS 1.0 11/22/2023   MONOABS 0.4 11/22/2023   EOSABS 0.0 11/22/2023   BASOSABS 0.0 11/22/2023     Last metabolic panel Lab Results  Component Value Date  NA 136 11/22/2023   K 3.5 11/22/2023   CL 100 11/22/2023   CO2 28 11/22/2023   BUN 9 11/22/2023   CREATININE 0.64 11/22/2023   GLUCOSE 106 (H) 11/22/2023   GFRNONAA >60 11/22/2023   CALCIUM  8.9 11/22/2023   PHOS 3.6 11/10/2023   PROT 6.3 (L) 11/20/2023   ALBUMIN  2.9 (L) 11/20/2023   BILITOT 1.2 11/20/2023   ALKPHOS 68 11/20/2023   AST 27 11/20/2023   ALT 16 11/20/2023   ANIONGAP 8 11/22/2023    CBG (last 3)  No results for input(s): "GLUCAP" in the last 72 hours.    Coagulation Profile: No results for input(s): "INR", "PROTIME" in the last 168 hours.   Radiology Studies: DG CHEST PORT 1 VIEW Result Date: 11/21/2023 CLINICAL DATA:  Follow-up [id number] EXAM: PORTABLE CHEST 1 VIEW COMPARISON:  Nov 18, 2023 FINDINGS: Persistent bilateral reticular interstitial infiltrates. Persistent small left pleural effusion and left lower lobe atelectasis. Heart and mediastinum normal Calcification of the annulus of the mitral valve IMPRESSION: Persistent bilateral reticular interstitial infiltrates. Persistent small left pleural effusion and left lower lobe atelectasis. Persistent pulmonary infiltrates. Edema versus pneumonia or combination of both Electronically Signed   By: Fredrich Jefferson M.D.   On: 11/21/2023 09:50   IR THORACENTESIS ASP PLEURAL SPACE W/IMG GUIDE Result Date: 11/21/2023 INDICATION: 68 year old female. History of pulmonary edema. Found to have left-sided pleural effusion. Request is for therapeutic thoracentesis EXAM: ULTRASOUND GUIDED LEFT-SIDED THERAPEUTIC THORACENTESIS MEDICATIONS: Lidocaine  1% 10 mL COMPLICATIONS: None immediate. PROCEDURE: An ultrasound guided thoracentesis was thoroughly discussed with the patient and questions answered. The benefits,  risks, alternatives and complications were also discussed. The patient understands and wishes to proceed with the procedure. Written consent was obtained. Ultrasound was performed to localize and mark an adequate pocket of fluid in the left chest. The area was then prepped and draped in the normal sterile fashion. 1% Lidocaine  was used for local anesthesia. Under ultrasound guidance a 6 Fr Safe-T-Centesis catheter was introduced. Thoracentesis was performed. The catheter was removed and a dressing applied. FINDINGS: A total of approximately 650 mL of straw-colored fluid was removed. IMPRESSION: Successful ultrasound guided therapeutic left-sided thoracentesis yielding 650 mL of pleural fluid. Read by: Reagan Camera, NP Electronically Signed   By: Melven Stable.  Shick M.D.   On: 11/21/2023 09:39   DG Chest 1 View Result Date: 11/21/2023 CLINICAL DATA:  [id number] S/P thoracentesis [id number]. EXAM: CHEST  1 VIEW COMPARISON:  Radiograph from earlier the same day at 6 a.m. FINDINGS: Re-demonstration of left retrocardiac airspace opacity obscuring the left hemidiaphragm, descending thoracic aorta and blunting the left lateral costophrenic angle, suggesting combination of left lung atelectasis and/or consolidation with pleural effusion. There is interval decrease in the amount of left pleural effusion, status post thoracentesis. No left pneumothorax seen. Bilateral lung fields are otherwise clear. Right lateral costophrenic angle is clear. Stable cardio-mediastinal silhouette. Mitral annulus calcifications noted. No acute osseous abnormalities. The soft tissues are within normal limits. IMPRESSION: *Interval decrease in the amount of left pleural effusion, status post thoracentesis. No pneumothorax. *Persistent left retrocardiac opacity, as described above. Electronically Signed   By: Beula Brunswick M.D.   On: 11/21/2023 09:16       Feliciana Horn M.D. [facility] 11/22/2023, 6:16 PM  Available via Epic secure chat  7am-7pm After 7 pm, please refer to night coverage provider listed on amion.

## 2023-11-23 DIAGNOSIS — K922 Gastrointestinal hemorrhage, unspecified: Secondary | ICD-10-CM | POA: Diagnosis not present

## 2023-11-23 DIAGNOSIS — I959 Hypotension, unspecified: Secondary | ICD-10-CM | POA: Diagnosis not present

## 2023-11-23 DIAGNOSIS — E669 Obesity, unspecified: Secondary | ICD-10-CM | POA: Diagnosis not present

## 2023-11-23 DIAGNOSIS — R278 Other lack of coordination: Secondary | ICD-10-CM | POA: Diagnosis not present

## 2023-11-23 DIAGNOSIS — M6259 Muscle wasting and atrophy, not elsewhere classified, multiple sites: Secondary | ICD-10-CM | POA: Diagnosis not present

## 2023-11-23 DIAGNOSIS — M6281 Muscle weakness (generalized): Secondary | ICD-10-CM | POA: Diagnosis not present

## 2023-11-23 DIAGNOSIS — R2689 Other abnormalities of gait and mobility: Secondary | ICD-10-CM | POA: Diagnosis not present

## 2023-11-23 DIAGNOSIS — R188 Other ascites: Secondary | ICD-10-CM | POA: Diagnosis not present

## 2023-11-23 DIAGNOSIS — D500 Iron deficiency anemia secondary to blood loss (chronic): Secondary | ICD-10-CM | POA: Diagnosis not present

## 2023-11-23 DIAGNOSIS — K31811 Angiodysplasia of stomach and duodenum with bleeding: Secondary | ICD-10-CM | POA: Diagnosis not present

## 2023-11-23 DIAGNOSIS — R197 Diarrhea, unspecified: Secondary | ICD-10-CM | POA: Diagnosis not present

## 2023-11-23 DIAGNOSIS — K31819 Angiodysplasia of stomach and duodenum without bleeding: Secondary | ICD-10-CM | POA: Diagnosis not present

## 2023-11-23 DIAGNOSIS — E119 Type 2 diabetes mellitus without complications: Secondary | ICD-10-CM | POA: Diagnosis not present

## 2023-11-23 DIAGNOSIS — R600 Localized edema: Secondary | ICD-10-CM | POA: Diagnosis not present

## 2023-11-23 DIAGNOSIS — R2681 Unsteadiness on feet: Secondary | ICD-10-CM | POA: Diagnosis not present

## 2023-11-23 DIAGNOSIS — R58 Hemorrhage, not elsewhere classified: Secondary | ICD-10-CM | POA: Diagnosis not present

## 2023-11-23 DIAGNOSIS — K7460 Unspecified cirrhosis of liver: Secondary | ICD-10-CM | POA: Diagnosis not present

## 2023-11-23 DIAGNOSIS — Z7401 Bed confinement status: Secondary | ICD-10-CM | POA: Diagnosis not present

## 2023-11-23 DIAGNOSIS — J90 Pleural effusion, not elsewhere classified: Secondary | ICD-10-CM | POA: Diagnosis not present

## 2023-11-23 DIAGNOSIS — K7290 Hepatic failure, unspecified without coma: Secondary | ICD-10-CM | POA: Diagnosis not present

## 2023-11-23 DIAGNOSIS — R601 Generalized edema: Secondary | ICD-10-CM | POA: Diagnosis not present

## 2023-11-23 LAB — CBC WITH DIFFERENTIAL/PLATELET
Abs Immature Granulocytes: 0.01 10*3/uL (ref 0.00–0.07)
Basophils Absolute: 0 10*3/uL (ref 0.0–0.1)
Basophils Relative: 1 %
Eosinophils Absolute: 0 10*3/uL (ref 0.0–0.5)
Eosinophils Relative: 0 %
HCT: 25.2 % — ABNORMAL LOW (ref 36.0–46.0)
Hemoglobin: 7.7 g/dL — ABNORMAL LOW (ref 12.0–15.0)
Immature Granulocytes: 0 %
Lymphocytes Relative: 24 %
Lymphs Abs: 1 10*3/uL (ref 0.7–4.0)
MCH: 26.7 pg (ref 26.0–34.0)
MCHC: 30.6 g/dL (ref 30.0–36.0)
MCV: 87.5 fL (ref 80.0–100.0)
Monocytes Absolute: 0.4 10*3/uL (ref 0.1–1.0)
Monocytes Relative: 9 %
Neutro Abs: 2.8 10*3/uL (ref 1.7–7.7)
Neutrophils Relative %: 66 %
Platelets: 145 10*3/uL — ABNORMAL LOW (ref 150–400)
RBC: 2.88 MIL/uL — ABNORMAL LOW (ref 3.87–5.11)
RDW: 15.8 % — ABNORMAL HIGH (ref 11.5–15.5)
WBC: 4.2 10*3/uL (ref 4.0–10.5)
nRBC: 0 % (ref 0.0–0.2)

## 2023-11-23 LAB — BASIC METABOLIC PANEL WITH GFR
Anion gap: 7 (ref 5–15)
BUN: 8 mg/dL (ref 8–23)
CO2: 32 mmol/L (ref 22–32)
Calcium: 8.4 mg/dL — ABNORMAL LOW (ref 8.9–10.3)
Chloride: 100 mmol/L (ref 98–111)
Creatinine, Ser: 0.58 mg/dL (ref 0.44–1.00)
GFR, Estimated: >60 mL/min (ref >60)
Glucose, Bld: 90 mg/dL (ref 70–99)
Potassium: 3.5 mmol/L (ref 3.5–5.1)
Sodium: 139 mmol/L (ref 135–145)

## 2023-11-23 LAB — HEMOGLOBIN AND HEMATOCRIT, BLOOD
HCT: 25.3 % — ABNORMAL LOW (ref 36.0–46.0)
Hemoglobin: 7.8 g/dL — ABNORMAL LOW (ref 12.0–15.0)

## 2023-11-23 MED ORDER — ACETAMINOPHEN 500 MG PO TABS: 500.0000 mg | ORAL_TABLET | Freq: Four times a day (QID) | ORAL | Status: AC | PRN

## 2023-11-23 MED ORDER — ACETAMINOPHEN 500 MG PO TABS: 500.0000 mg | ORAL_TABLET | Freq: Four times a day (QID) | ORAL | Status: DC | PRN

## 2023-11-23 MED ORDER — SPIRONOLACTONE 100 MG PO TABS: 100.0000 mg | ORAL_TABLET | Freq: Every day | ORAL | Status: DC

## 2023-11-23 MED ORDER — MIDODRINE HCL 5 MG PO TABS
15.0000 mg | ORAL_TABLET | Freq: Three times a day (TID) | ORAL | Status: DC
Start: 2023-11-23 — End: 2023-12-20

## 2023-11-23 MED ORDER — PANTOPRAZOLE SODIUM 40 MG PO TBEC: 40.0000 mg | DELAYED_RELEASE_TABLET | Freq: Two times a day (BID) | ORAL | Status: DC

## 2023-11-23 MED ORDER — FUROSEMIDE 40 MG PO TABS
40.0000 mg | ORAL_TABLET | Freq: Two times a day (BID) | ORAL | Status: DC
Start: 2023-11-23 — End: 2023-12-20

## 2023-11-23 NOTE — Progress Notes (Signed)
 Mobility Specialist Progress Note;   11/23/23 0936  Mobility  Activity Transferred to/from [facility]  Level of Assistance Contact guard assist, steadying assist  Assistive Device Front wheel walker  Distance Ambulated (ft) 4 ft  Activity Response Tolerated fair  Mobility Referral Yes  Mobility Specialist Start Time (ACUTE ONLY) 0936  Mobility Specialist Stop Time (ACUTE ONLY) 0942  Mobility Specialist Time Calculation (min) (ACUTE ONLY) 6 min    Pre-mobility: BP 96/38 (55)  Pt on BSC upon arrival, agreeable to mobility. Took sitting BP before mobility (see above). Deferred further mobility at this time. Required MinG assistance to assist w/ pericare and transfer pt from [facility] to bed safely. Pt left in bed with all needs met.   Janit Meline Mobility Specialist Please contact via SecureChat or [employer] [phone]

## 2023-11-23 NOTE — Progress Notes (Signed)
 Tried to give report to [facility] but no answer. Will call back at a later time.

## 2023-11-23 NOTE — Discharge Summary (Signed)
 DISCHARGE SUMMARY  Sierra Logan  MR#: [id number]  DOB:January 30, 1956  Date of Admission: 11/07/2023 Date of Discharge: 11/23/2023  Attending Physician:Majesti Gambrell Constantine Delude, MD  Patient's WUJ:[id number], Marjory Signs, MD  Disposition: D/C to SNF for rehab stay   Follow-up Appts:  Follow-up Information     Adelia Homestead, MD Follow up in 1 week(s).   Specialty: Internal Medicine Contact information: [address] [place] [address] [phone]         Mansouraty, Albino Alu., MD Follow up.   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  Initial presentation: 68 year old with a history of HTN, HLD, DM2, and cirrhosis with esophageal varices who presented to the ER 4/21 with melanotic stools and swelling of the legs and feet which she reported had been present for approximately one month.  Presenting hemoglobin was 10.3.  CXR revealed a moderate size left pleural effusion.  FOBT was positive.  Physical exam revealed anasarca.   Following her admission the patient experienced an abrupt drop in her hemoglobin down to 6.5.  She underwent EGD that revealed bleeding gastric antrum vascular ectasias.  Hospital Course:  Anasarca -decompensated cirrhosis of the liver due to PBC Improved with ongoing diuresis -midodrine  titrated during this admission -TTE noted preserved EF at 60-65% -underwent paracentesis 4/24 producing 1.8 L of ascites with no evidence of SBP -left-sided  thoracentesis completed producing 650 cc of fluid -continue Aldactone  plus Lasix  -unable to tolerate Coreg  due to fatigue and hypotension -monitor volume status closely as an outpatient with possible need for further titration of diuretics to avoid volume overload   Acute GI bleed EGD 4/21 noted grade 2 varices in the lower third of esophagus without bleeding and gastric antral vascular ectasia (GAVE) with active bleeding which was treated with ACP (cauterization) -continue PPI twice daily - has completed ~10 days of Carafate  - transfused 2 units PRBC total this admit, the last on 11/20/23 - recheck of Hgb in 48hrs is indicated - Hgb appears stable at present, accounting for equilibration    Recent Labs  Lab 11/21/23 1428 11/22/23 0232 11/22/23 1349 11/23/23 0206 11/23/23 0207  HGB 9.0* 8.9* 8.5* 7.8* 7.7*        Left pleural effusion Due to anasarca of cirrhosis -status post thoracentesis 5/5 with removal of 650 cc of fluid - f/u CXR stable   DM2 Well-controlled with A1c 4.8 - diet controlled only   HLD Continue statin -LFTs normal   Obesity class I - Body mass index is 30.49 kg/m.  Allergies as of 11/23/2023   No Known Allergies      Medication List     TAKE these medications    acetaminophen  500 MG tablet Commonly known as: TYLENOL  Take 1 tablet (500 mg total) by mouth every 6 (six) hours as needed for mild pain (pain score 1-3) (or Fever >/= 101). What changed:  how much to take when to take this reasons to take this  atorvastatin  10 MG tablet Commonly known as: LIPITOR Take 1 tablet (10 mg total) by mouth at bedtime.   BIOTIN PO Take 1 tablet by mouth daily with breakfast.   CALCIUM +D3 PO Take 1 tablet by mouth daily after breakfast.   FISH OIL PO Take 1 capsule by mouth daily with breakfast.   furosemide  40 MG tablet Commonly known as: LASIX  Take 1 tablet (40 mg total) by mouth 2 (two) times daily.   gabapentin  400 MG capsule Commonly known as:  NEURONTIN  TAKE 1 CAPSULE EVERY MORNING AND 2 AT BEDTIME What changed:  how much to take how to take this when to take this   midodrine  5 MG tablet Commonly known as: PROAMATINE  Take 3 tablets (15 mg total) by mouth 3 (three) times daily with meals.   pantoprazole  40 MG tablet Commonly known as: PROTONIX  Take 1 tablet (40 mg total) by mouth 2 (two) times daily.   spironolactone  100 MG tablet Commonly known as: ALDACTONE  Take 1 tablet (100 mg total) by mouth daily. What changed:  medication strength how much to take   traZODone  50 MG tablet Commonly known as: DESYREL  TAKE 1 TABLET(50 MG) BY MOUTH AT BEDTIME What changed: See the new instructions.        Day of Discharge BP (!) 103/31 (BP Location: Left Arm)   Pulse 81   Temp 99 F (37.2 C) (Oral)   Resp 16   Ht 5\' 2"  (1.575 m)   Wt 75.6 kg Comment: Scale B  SpO2 90%   BMI 30.49 kg/m   Physical Exam: General: No acute respiratory distress Lungs: Clear to auscultation bilaterally without wheezes or crackles Cardiovascular: Regular rate and rhythm without murmur gallop or rub normal S1 and S2 Abdomen: Nontender, nondistended, soft, bowel sounds positive, no rebound, no ascites, no appreciable mass Extremities: No significant cyanosis, clubbing, or edema bilateral lower extremities  Basic Metabolic Panel: Recent Labs  Lab 11/18/23 0215 11/20/23 0216 11/21/23 1021 11/22/23 0232 11/23/23 0207  NA 133* 136 138 136 139  K 3.7 3.6 3.5 3.5 3.5  CL 99 100 99 100 100  CO2 27 29 30 28  32  GLUCOSE 95 84 110* 106* 90  BUN 12 9 9 9 8   CREATININE 0.58 0.66 0.70 0.64 0.58  CALCIUM  8.5* 8.3* 8.8* 8.9 8.4*    CBC: Recent Labs  Lab 11/19/23 0907 11/19/23 1227 11/20/23 0216 11/20/23 2004 11/21/23 0211 11/21/23 1428 11/22/23 0232 11/22/23 1349 11/23/23 0206 11/23/23 0207  WBC 5.0  --  4.2  --  4.8  --  5.5  --   --  4.2  NEUTROABS 3.3  --  2.8  --  3.1  --  4.0  --   --  2.8  HGB 6.5*   < > 7.3*   < > 8.0*  9.0* 8.9* 8.5* 7.8* 7.7*  HCT 21.5*   < > 23.3*   < > 25.5* 29.2* 28.1* 27.9* 25.3* 25.2*  MCV 85.3  --  84.1  --  86.1  --  87.0  --   --  87.5  PLT 133*  --  123*  --  128*  --  154  --   --  145*   < > = values in this interval not displayed.    Time spent in discharge (includes decision making & examination of pt): 35 minutes  11/23/2023, 10:34 AM   Abbe Abate, MD [facility] Office  [phone]

## 2023-11-23 NOTE — TOC Transition Note (Signed)
 Transition of Care [facility]) - Discharge Note   Patient Details  Name: Sierra Logan MRN: [id number] Date of Birth: 09-08-1955  Transition of Care [facility] CM/SW Contact:  Juliane Och, LCSW Phone Number: 11/23/2023, 11:26 AM   Clinical Narrative:     Patient will DC to: [facility] SNF  Anticipated DC date: 11/23/2023 Family notified: Kristyna Abdella; Spouse; [phone] Transport by: Lyna Sandhoff   Per MD patient ready for DC to [facility]. RN to call report prior to discharge [phone]). RN, patient, patient's family (by patient), and facility notified of DC. Discharge Summary and FL2 sent to facility. DC packet on chart. Ambulance transport requested for patient at 11:24AM.   CSW will sign off for now as social work intervention is no longer needed. Please consult us  again if new needs arise.    Final next level of care: Skilled Nursing Facility Barriers to Discharge: Barriers Resolved   Patient Goals and CMS Choice Patient states their goals for this hospitalization and ongoing recovery are:: SNF CMS Medicare.gov Compare Post Acute Care list provided to:: Patient Choice offered to / list presented to : Patient      Discharge Placement              Patient chooses bed at: [facility] Patient to be transferred to facility by: PTAR Name of family member notified: Paree Morano; Spouse; [phone] Patient and family notified of of transfer: 11/23/23  Discharge Plan and Services Additional resources added to the After Visit Summary for   In-house Referral: Clinical Social Work   Post Acute Care Choice: Skilled Nursing Facility                               Social Drivers of Health (SDOH) Interventions SDOH Screenings   Food Insecurity: No Food Insecurity (11/08/2023)  Housing: Low Risk  (11/08/2023)  Transportation Needs: No Transportation Needs (11/08/2023)  Utilities: Not At Risk (11/08/2023)  Depression (PHQ2-9): Low Risk  (10/27/2023)  Social  Connections: Moderately Isolated (11/08/2023)  Tobacco Use: High Risk (11/09/2023)     Readmission Risk Interventions     No data to display

## 2023-11-23 NOTE — Plan of Care (Signed)

## 2023-11-23 NOTE — Progress Notes (Signed)
 Gave report to nurse at [facility] and answered all questions. Attending Jonelle Neri is aware and comfortable with patient having soft blood pressures. Nurse made aware. Awaiting for PTAR.

## 2023-11-23 NOTE — Progress Notes (Signed)
 Physical Therapy Treatment Patient Details Name: Sierra Logan MRN: [id number] DOB: [birth year] Today's Date: 11/23/2023   History of Present Illness 68 year old presented 11/07/23 with swelling of legs, feet, stomach and black stool. Chest x-ray with moderate severity left basilar atelectasis or infiltrate, moderate-sized left pleural effusion. +anasarca; +GI bleed; 4/23 EGD with esophageal varices; 4/24 paracentesis; persistent hypotension; 5/5 L thoracentesis  PMH  hypertension, hyperlipidemia, type 2 diabetes, history of cirrhosis with esophageal varices    PT Comments  Pt on BSC on arrival, pleasant and agreeable to therapy session. Pt demonstrated sit<>stand transfer without an AD, and CGA for safety. Pt able to statically stand with no UE support for ~57min to perform pericare and stand at bedside for ~21min with no LOB and CGA for safety. Pt demonstrated gait with RW support and CGA for safety with SpO2 as low as 85% on room air with c/o of SOB, and recovering up to 96% on 3L with symptoms subsiding. Pt is limited in activity tolerance due to soft BP and low O2 sats, see comments below. Patient will benefit from continued inpatient follow up therapy, <3 hours/day, will continue to follow acutely.        If plan is discharge home, recommend the following: A little help with walking and/or transfers;A little help with bathing/dressing/bathroom;Assistance with cooking/housework;Assist for transportation;Help with stairs or ramp for entrance   Can travel by private vehicle     No  Equipment Recommendations  Other (comment) (TBD; pt reports house too small for use of RW)    Recommendations for Other Services OT consult     Precautions / Restrictions Precautions Precautions: Fall Recall of Precautions/Restrictions: Intact Restrictions Weight Bearing Restrictions Per Provider Order: No     Mobility  Bed Mobility               General bed mobility comments: BSC on arrival and EOB  at end of session    Transfers   Equipment used: Rolling walker (2 wheels), None Transfers: Sit to/from Stand Sit to Stand: Contact guard assist           General transfer comment: CGA for saftey, from [facility] no AD needed, used RW during gait    Ambulation/Gait Ambulation/Gait assistance: Contact guard assist Gait Distance (Feet): 18 Feet (+186ft, standing rest break) Assistive device: Rolling walker (2 wheels), None Gait Pattern/deviations: Step-through pattern, Decreased stride length Gait velocity: decr     General Gait Details: from [facility] to bed with no AD, no LOB, CGA for saftey and RW with gait for steadying and CGA for saftey, pt walking 32ft on RA with SpO2 at 84%, standing rest break and 3L of O2 with SpO2 up to 96% during the rest of gait   Stairs             Wheelchair Mobility     Tilt Bed    Modified Rankin (Stroke Patients Only)       Balance Overall balance assessment: Needs assistance Sitting-balance support: No upper extremity supported, Feet supported Sitting balance-Leahy Scale: Good     Standing balance support: No upper extremity supported Standing balance-Leahy Scale: Fair Standing balance comment: able to statically stand for ~40min at [facility] and beside the bed with no LOB, RW for gait to steady                            Communication Communication Communication: Impaired Factors Affecting Communication: Hearing impaired  Cognition Arousal: Alert  Behavior During Therapy: WFL for tasks assessed/performed   PT - Cognitive impairments: No apparent impairments                         Following commands: Intact      Cueing Cueing Techniques: Verbal cues  Exercises      General Comments General comments (skin integrity, edema, etc.): BP 104/41(59) in sitting, 90/42(57) in standing, and 98/53(67) after gait at end of session with abdominal binder on. Pt SpO2 dropping as low as 85% on RA with pt c/o of mild SOB, pt  able to recover to 96% on 3L of O2 during gait and left on 3L at end of session with SpO2 at 97%      Pertinent Vitals/Pain Pain Assessment Pain Assessment: No/denies pain Pain Intervention(s): Monitored during session    Home Living                          Prior Function            PT Goals (current goals can now be found in the care plan section) Acute Rehab PT Goals PT Goal Formulation: With patient Time For Goal Achievement: 11/24/23 Progress towards PT goals: Progressing toward goals    Frequency    Min 2X/week      PT Plan      Co-evaluation              AM-PAC PT "6 Clicks" Mobility   Outcome Measure  Help needed turning from your back to your side while in a flat bed without using bedrails?: None Help needed moving from lying on your back to sitting on the side of a flat bed without using bedrails?: A Little Help needed moving to and from a bed to a chair (including a wheelchair)?: A Little Help needed standing up from a chair using your arms (e.g., wheelchair or bedside chair)?: A Little Help needed to walk in hospital room?: A Little Help needed climbing 3-5 steps with a railing? : Total 6 Click Score: 17    End of Session Equipment Utilized During Treatment: Gait belt;Oxygen Activity Tolerance: Patient tolerated treatment well Patient left: in bed;with call bell/phone within reach (sitting EOB with lunch) Nurse Communication: Mobility status PT Visit Diagnosis: Other abnormalities of gait and mobility (R26.89);Repeated falls (R29.6);Muscle weakness (generalized) (M62.81);Dizziness and giddiness (R42)     Time: 1308-6578 PT Time Calculation (min) (ACUTE ONLY): 23 min  Charges:    $Gait Training: 8-22 mins $Therapeutic Activity: 8-22 mins PT General Charges $$ ACUTE PT VISIT: 1 Visit                     Forrest Iha 11/23/2023, 1:13 PM

## 2023-11-24 DIAGNOSIS — K7290 Hepatic failure, unspecified without coma: Secondary | ICD-10-CM | POA: Diagnosis not present

## 2023-11-24 DIAGNOSIS — R601 Generalized edema: Secondary | ICD-10-CM | POA: Diagnosis not present

## 2023-11-24 DIAGNOSIS — K31819 Angiodysplasia of stomach and duodenum without bleeding: Secondary | ICD-10-CM | POA: Diagnosis not present

## 2023-11-24 DIAGNOSIS — E119 Type 2 diabetes mellitus without complications: Secondary | ICD-10-CM | POA: Diagnosis not present

## 2023-11-24 DIAGNOSIS — D500 Iron deficiency anemia secondary to blood loss (chronic): Secondary | ICD-10-CM | POA: Diagnosis not present

## 2023-11-24 DIAGNOSIS — R188 Other ascites: Secondary | ICD-10-CM | POA: Diagnosis not present

## 2023-11-24 DIAGNOSIS — R197 Diarrhea, unspecified: Secondary | ICD-10-CM | POA: Diagnosis not present

## 2023-11-28 DIAGNOSIS — R601 Generalized edema: Secondary | ICD-10-CM | POA: Diagnosis not present

## 2023-11-28 DIAGNOSIS — E119 Type 2 diabetes mellitus without complications: Secondary | ICD-10-CM | POA: Diagnosis not present

## 2023-11-28 DIAGNOSIS — K31819 Angiodysplasia of stomach and duodenum without bleeding: Secondary | ICD-10-CM | POA: Diagnosis not present

## 2023-11-28 DIAGNOSIS — R188 Other ascites: Secondary | ICD-10-CM | POA: Diagnosis not present

## 2023-11-28 DIAGNOSIS — D500 Iron deficiency anemia secondary to blood loss (chronic): Secondary | ICD-10-CM | POA: Diagnosis not present

## 2023-11-28 DIAGNOSIS — R197 Diarrhea, unspecified: Secondary | ICD-10-CM | POA: Diagnosis not present

## 2023-11-28 DIAGNOSIS — K7290 Hepatic failure, unspecified without coma: Secondary | ICD-10-CM | POA: Diagnosis not present

## 2023-11-29 DIAGNOSIS — R601 Generalized edema: Secondary | ICD-10-CM | POA: Diagnosis not present

## 2023-11-29 DIAGNOSIS — D500 Iron deficiency anemia secondary to blood loss (chronic): Secondary | ICD-10-CM | POA: Diagnosis not present

## 2023-11-29 DIAGNOSIS — K7290 Hepatic failure, unspecified without coma: Secondary | ICD-10-CM | POA: Diagnosis not present

## 2023-11-29 DIAGNOSIS — E119 Type 2 diabetes mellitus without complications: Secondary | ICD-10-CM | POA: Diagnosis not present

## 2023-11-29 DIAGNOSIS — R188 Other ascites: Secondary | ICD-10-CM | POA: Diagnosis not present

## 2023-11-29 DIAGNOSIS — K31819 Angiodysplasia of stomach and duodenum without bleeding: Secondary | ICD-10-CM | POA: Diagnosis not present

## 2023-11-29 DIAGNOSIS — R197 Diarrhea, unspecified: Secondary | ICD-10-CM | POA: Diagnosis not present

## 2023-11-29 LAB — COMPREHENSIVE METABOLIC PANEL WITH GFR
Albumin: 3.7 (ref 3.5–5.0)
Calcium: 8.8 (ref 8.7–10.7)

## 2023-11-29 LAB — BASIC METABOLIC PANEL WITH GFR
BUN: 1 — AB (ref 4–21)
CO2: 30 — AB (ref 13–22)
Chloride: 4 — AB (ref 99–108)
Creatinine: 0.6 (ref 0.5–1.1)
Glucose: 84
Sodium: 138 (ref 137–147)

## 2023-11-29 LAB — HEPATIC FUNCTION PANEL: AST: 19 (ref 13–35)

## 2023-11-30 DIAGNOSIS — R188 Other ascites: Secondary | ICD-10-CM | POA: Diagnosis not present

## 2023-11-30 DIAGNOSIS — R197 Diarrhea, unspecified: Secondary | ICD-10-CM | POA: Diagnosis not present

## 2023-11-30 DIAGNOSIS — K31819 Angiodysplasia of stomach and duodenum without bleeding: Secondary | ICD-10-CM | POA: Diagnosis not present

## 2023-11-30 DIAGNOSIS — R601 Generalized edema: Secondary | ICD-10-CM | POA: Diagnosis not present

## 2023-11-30 DIAGNOSIS — E119 Type 2 diabetes mellitus without complications: Secondary | ICD-10-CM | POA: Diagnosis not present

## 2023-11-30 DIAGNOSIS — D500 Iron deficiency anemia secondary to blood loss (chronic): Secondary | ICD-10-CM | POA: Diagnosis not present

## 2023-11-30 DIAGNOSIS — K7290 Hepatic failure, unspecified without coma: Secondary | ICD-10-CM | POA: Diagnosis not present

## 2023-12-02 DIAGNOSIS — K31819 Angiodysplasia of stomach and duodenum without bleeding: Secondary | ICD-10-CM | POA: Diagnosis not present

## 2023-12-02 DIAGNOSIS — D500 Iron deficiency anemia secondary to blood loss (chronic): Secondary | ICD-10-CM | POA: Diagnosis not present

## 2023-12-02 DIAGNOSIS — E119 Type 2 diabetes mellitus without complications: Secondary | ICD-10-CM | POA: Diagnosis not present

## 2023-12-02 DIAGNOSIS — R601 Generalized edema: Secondary | ICD-10-CM | POA: Diagnosis not present

## 2023-12-02 DIAGNOSIS — R188 Other ascites: Secondary | ICD-10-CM | POA: Diagnosis not present

## 2023-12-02 DIAGNOSIS — K7290 Hepatic failure, unspecified without coma: Secondary | ICD-10-CM | POA: Diagnosis not present

## 2023-12-05 DIAGNOSIS — R188 Other ascites: Secondary | ICD-10-CM | POA: Diagnosis not present

## 2023-12-05 DIAGNOSIS — K7460 Unspecified cirrhosis of liver: Secondary | ICD-10-CM | POA: Diagnosis not present

## 2023-12-05 DIAGNOSIS — R601 Generalized edema: Secondary | ICD-10-CM | POA: Diagnosis not present

## 2023-12-05 DIAGNOSIS — K31819 Angiodysplasia of stomach and duodenum without bleeding: Secondary | ICD-10-CM | POA: Diagnosis not present

## 2023-12-05 DIAGNOSIS — E119 Type 2 diabetes mellitus without complications: Secondary | ICD-10-CM | POA: Diagnosis not present

## 2023-12-05 LAB — CBC AND DIFFERENTIAL
HCT: 26 — AB (ref 36–46)
Hemoglobin: 8.3 — AB (ref 12.0–16.0)
Platelets: 240 10*3/uL (ref 150–400)
WBC: 5.4

## 2023-12-07 ENCOUNTER — Telehealth: Payer: Self-pay | Admitting: Internal Medicine

## 2023-12-07 NOTE — Telephone Encounter (Signed)
 Copied from CRM [id number]. Topic: General - Other >> Dec 07, 2023 11:50 AM Adonis Hoot wrote: Reason for CRM: Patient called to let provider know that she is in [facility] and will be getting discharged on Friday.

## 2023-12-08 DIAGNOSIS — K7460 Unspecified cirrhosis of liver: Secondary | ICD-10-CM | POA: Diagnosis not present

## 2023-12-08 DIAGNOSIS — E119 Type 2 diabetes mellitus without complications: Secondary | ICD-10-CM | POA: Diagnosis not present

## 2023-12-08 DIAGNOSIS — R601 Generalized edema: Secondary | ICD-10-CM | POA: Diagnosis not present

## 2023-12-08 DIAGNOSIS — K31819 Angiodysplasia of stomach and duodenum without bleeding: Secondary | ICD-10-CM | POA: Diagnosis not present

## 2023-12-08 DIAGNOSIS — R188 Other ascites: Secondary | ICD-10-CM | POA: Diagnosis not present

## 2023-12-09 DIAGNOSIS — K31819 Angiodysplasia of stomach and duodenum without bleeding: Secondary | ICD-10-CM | POA: Diagnosis not present

## 2023-12-09 DIAGNOSIS — K7460 Unspecified cirrhosis of liver: Secondary | ICD-10-CM | POA: Diagnosis not present

## 2023-12-09 DIAGNOSIS — R601 Generalized edema: Secondary | ICD-10-CM | POA: Diagnosis not present

## 2023-12-09 DIAGNOSIS — J90 Pleural effusion, not elsewhere classified: Secondary | ICD-10-CM | POA: Diagnosis not present

## 2023-12-09 DIAGNOSIS — E119 Type 2 diabetes mellitus without complications: Secondary | ICD-10-CM | POA: Diagnosis not present

## 2023-12-09 DIAGNOSIS — K922 Gastrointestinal hemorrhage, unspecified: Secondary | ICD-10-CM | POA: Diagnosis not present

## 2023-12-09 DIAGNOSIS — K31811 Angiodysplasia of stomach and duodenum with bleeding: Secondary | ICD-10-CM | POA: Diagnosis not present

## 2023-12-09 DIAGNOSIS — R188 Other ascites: Secondary | ICD-10-CM | POA: Diagnosis not present

## 2023-12-09 DIAGNOSIS — M6259 Muscle wasting and atrophy, not elsewhere classified, multiple sites: Secondary | ICD-10-CM | POA: Diagnosis not present

## 2023-12-09 DIAGNOSIS — R600 Localized edema: Secondary | ICD-10-CM | POA: Diagnosis not present

## 2023-12-10 DIAGNOSIS — J90 Pleural effusion, not elsewhere classified: Secondary | ICD-10-CM | POA: Diagnosis not present

## 2023-12-10 DIAGNOSIS — Z9981 Dependence on supplemental oxygen: Secondary | ICD-10-CM | POA: Diagnosis not present

## 2023-12-10 DIAGNOSIS — Z6827 Body mass index (BMI) 27.0-27.9, adult: Secondary | ICD-10-CM | POA: Diagnosis not present

## 2023-12-10 DIAGNOSIS — D500 Iron deficiency anemia secondary to blood loss (chronic): Secondary | ICD-10-CM | POA: Diagnosis not present

## 2023-12-10 DIAGNOSIS — E119 Type 2 diabetes mellitus without complications: Secondary | ICD-10-CM | POA: Diagnosis not present

## 2023-12-10 DIAGNOSIS — R601 Generalized edema: Secondary | ICD-10-CM | POA: Diagnosis not present

## 2023-12-10 DIAGNOSIS — E66811 Obesity, class 1: Secondary | ICD-10-CM | POA: Diagnosis not present

## 2023-12-10 DIAGNOSIS — I8500 Esophageal varices without bleeding: Secondary | ICD-10-CM | POA: Diagnosis not present

## 2023-12-10 DIAGNOSIS — R188 Other ascites: Secondary | ICD-10-CM | POA: Diagnosis not present

## 2023-12-10 DIAGNOSIS — K219 Gastro-esophageal reflux disease without esophagitis: Secondary | ICD-10-CM | POA: Diagnosis not present

## 2023-12-10 DIAGNOSIS — E785 Hyperlipidemia, unspecified: Secondary | ICD-10-CM | POA: Diagnosis not present

## 2023-12-10 DIAGNOSIS — K7460 Unspecified cirrhosis of liver: Secondary | ICD-10-CM | POA: Diagnosis not present

## 2023-12-10 DIAGNOSIS — K31811 Angiodysplasia of stomach and duodenum with bleeding: Secondary | ICD-10-CM | POA: Diagnosis not present

## 2023-12-13 ENCOUNTER — Telehealth: Payer: Self-pay | Admitting: Internal Medicine

## 2023-12-13 NOTE — Telephone Encounter (Signed)
 Copied from CRM [id number]. Topic: Clinical - Prescription Issue >> Dec 13, 2023  9:51 AM Dimple Francis wrote: Reason for CRM: patient needing to have medications sent to  [facility]  [address] [place], [place], [place] [address] Phone: [phone]

## 2023-12-13 NOTE — Telephone Encounter (Signed)
 Copied from CRM [id number]. Topic: Clinical - Medication Refill >> Dec 13, 2023  9:47 AM [place] M wrote: Medication: furosemide  (LASIX ) 40 MG tablet ; gabapentin  (NEURONTIN ) 400 MG capsule ; midodrine  (PROAMATINE ) 5 MG tablet ; pantoprazole  (PROTONIX ) 40 MG tablet ; spironolactone  (ALDACTONE ) 100 MG tablet  Has the patient contacted their pharmacy? Yes (Agent: If no, request that the patient contact the pharmacy for the refill. If patient does not wish to contact the pharmacy document the reason why and proceed with request.) (Agent: If yes, when and what did the pharmacy advise?)  This is the patient's preferred pharmacy:  [id number] - [place] - [address] [place] [address] Phone: [phone] Fax: [phone]    Is this the correct pharmacy for this prescription? Yes If no, delete pharmacy and type the correct one.   Has the prescription been filled recently? Yes  Is the patient out of the medication? Yes  Has the patient been seen for an appointment in the last year OR does the patient have an upcoming appointment? Yes  Can we respond through MyChart? Yes  Agent: Please be advised that Rx refills may take up to 3 business days. We ask that you follow-up with your pharmacy.

## 2023-12-15 ENCOUNTER — Telehealth: Payer: Self-pay | Admitting: Internal Medicine

## 2023-12-15 NOTE — Telephone Encounter (Signed)
 Copied from CRM [id number]. Topic: Clinical - Home Health Verbal Orders >> Dec 14, 2023  3:11 PM Magdalene School wrote: Caller/Agency: [facility] Callback Number: [phone] Service Requested: Physical Therapy Frequency: 1 week 8 effective 12/14/23 Any new concerns about the patient? No

## 2023-12-16 NOTE — Telephone Encounter (Signed)
 We cannot supervise hh orders as we have no f21f

## 2023-12-19 ENCOUNTER — Ambulatory Visit: Admitting: Internal Medicine

## 2023-12-20 ENCOUNTER — Ambulatory Visit: Payer: Self-pay

## 2023-12-20 ENCOUNTER — Ambulatory Visit (INDEPENDENT_AMBULATORY_CARE_PROVIDER_SITE_OTHER): Admitting: Internal Medicine

## 2023-12-20 ENCOUNTER — Other Ambulatory Visit: Payer: Self-pay

## 2023-12-20 ENCOUNTER — Encounter: Payer: Self-pay | Admitting: Internal Medicine

## 2023-12-20 ENCOUNTER — Encounter (HOSPITAL_COMMUNITY): Payer: Self-pay | Admitting: Emergency Medicine

## 2023-12-20 ENCOUNTER — Emergency Department (HOSPITAL_COMMUNITY)

## 2023-12-20 ENCOUNTER — Telehealth: Payer: Self-pay

## 2023-12-20 ENCOUNTER — Inpatient Hospital Stay (HOSPITAL_COMMUNITY)
Admission: EM | Admit: 2023-12-20 | Discharge: 2023-12-23 | DRG: 377 | Disposition: A | Discharge status: Home / Self Care | Attending: Internal Medicine | Admitting: Internal Medicine

## 2023-12-20 VITALS — BP 96/68 | HR 122 | Temp 98.3°F | Ht 62.0 in | Wt 157.0 lb

## 2023-12-20 DIAGNOSIS — K7460 Unspecified cirrhosis of liver: Secondary | ICD-10-CM

## 2023-12-20 DIAGNOSIS — I8510 Secondary esophageal varices without bleeding: Secondary | ICD-10-CM | POA: Diagnosis present

## 2023-12-20 DIAGNOSIS — E785 Hyperlipidemia, unspecified: Secondary | ICD-10-CM | POA: Diagnosis present

## 2023-12-20 DIAGNOSIS — J90 Pleural effusion, not elsewhere classified: Secondary | ICD-10-CM | POA: Diagnosis present

## 2023-12-20 DIAGNOSIS — K743 Primary biliary cirrhosis: Secondary | ICD-10-CM

## 2023-12-20 DIAGNOSIS — K31819 Angiodysplasia of stomach and duodenum without bleeding: Secondary | ICD-10-CM | POA: Diagnosis present

## 2023-12-20 DIAGNOSIS — D62 Acute posthemorrhagic anemia: Secondary | ICD-10-CM

## 2023-12-20 DIAGNOSIS — Z833 Family history of diabetes mellitus: Secondary | ICD-10-CM | POA: Diagnosis not present

## 2023-12-20 DIAGNOSIS — K766 Portal hypertension: Secondary | ICD-10-CM | POA: Diagnosis not present

## 2023-12-20 DIAGNOSIS — K3189 Other diseases of stomach and duodenum: Secondary | ICD-10-CM | POA: Diagnosis not present

## 2023-12-20 DIAGNOSIS — Z8719 Personal history of other diseases of the digestive system: Secondary | ICD-10-CM | POA: Diagnosis not present

## 2023-12-20 DIAGNOSIS — I10 Essential (primary) hypertension: Secondary | ICD-10-CM | POA: Diagnosis not present

## 2023-12-20 DIAGNOSIS — I8511 Secondary esophageal varices with bleeding: Secondary | ICD-10-CM | POA: Diagnosis present

## 2023-12-20 DIAGNOSIS — K635 Polyp of colon: Secondary | ICD-10-CM | POA: Diagnosis present

## 2023-12-20 DIAGNOSIS — Z8042 Family history of malignant neoplasm of prostate: Secondary | ICD-10-CM

## 2023-12-20 DIAGNOSIS — I9589 Other hypotension: Secondary | ICD-10-CM | POA: Diagnosis not present

## 2023-12-20 DIAGNOSIS — E871 Hypo-osmolality and hyponatremia: Secondary | ICD-10-CM | POA: Diagnosis not present

## 2023-12-20 DIAGNOSIS — E119 Type 2 diabetes mellitus without complications: Secondary | ICD-10-CM

## 2023-12-20 DIAGNOSIS — J9611 Chronic respiratory failure with hypoxia: Secondary | ICD-10-CM | POA: Diagnosis not present

## 2023-12-20 DIAGNOSIS — R188 Other ascites: Secondary | ICD-10-CM | POA: Diagnosis present

## 2023-12-20 DIAGNOSIS — F17210 Nicotine dependence, cigarettes, uncomplicated: Secondary | ICD-10-CM | POA: Diagnosis not present

## 2023-12-20 DIAGNOSIS — K922 Gastrointestinal hemorrhage, unspecified: Secondary | ICD-10-CM

## 2023-12-20 DIAGNOSIS — Z79899 Other long term (current) drug therapy: Secondary | ICD-10-CM

## 2023-12-20 DIAGNOSIS — E1140 Type 2 diabetes mellitus with diabetic neuropathy, unspecified: Secondary | ICD-10-CM | POA: Diagnosis not present

## 2023-12-20 DIAGNOSIS — K7290 Hepatic failure, unspecified without coma: Secondary | ICD-10-CM

## 2023-12-20 DIAGNOSIS — R911 Solitary pulmonary nodule: Secondary | ICD-10-CM

## 2023-12-20 DIAGNOSIS — Z9889 Other specified postprocedural states: Secondary | ICD-10-CM

## 2023-12-20 DIAGNOSIS — Z8249 Family history of ischemic heart disease and other diseases of the circulatory system: Secondary | ICD-10-CM | POA: Diagnosis not present

## 2023-12-20 DIAGNOSIS — R9389 Abnormal findings on diagnostic imaging of other specified body structures: Secondary | ICD-10-CM | POA: Diagnosis not present

## 2023-12-20 DIAGNOSIS — K31811 Angiodysplasia of stomach and duodenum with bleeding: Secondary | ICD-10-CM | POA: Diagnosis not present

## 2023-12-20 DIAGNOSIS — D649 Anemia, unspecified: Principal | ICD-10-CM | POA: Diagnosis present

## 2023-12-20 DIAGNOSIS — D500 Iron deficiency anemia secondary to blood loss (chronic): Secondary | ICD-10-CM | POA: Diagnosis not present

## 2023-12-20 DIAGNOSIS — K219 Gastro-esophageal reflux disease without esophagitis: Secondary | ICD-10-CM | POA: Diagnosis present

## 2023-12-20 DIAGNOSIS — I8500 Esophageal varices without bleeding: Secondary | ICD-10-CM | POA: Diagnosis not present

## 2023-12-20 DIAGNOSIS — R7303 Prediabetes: Secondary | ICD-10-CM

## 2023-12-20 DIAGNOSIS — Z720 Tobacco use: Secondary | ICD-10-CM | POA: Diagnosis present

## 2023-12-20 DIAGNOSIS — K648 Other hemorrhoids: Secondary | ICD-10-CM | POA: Diagnosis present

## 2023-12-20 DIAGNOSIS — K2970 Gastritis, unspecified, without bleeding: Secondary | ICD-10-CM | POA: Diagnosis not present

## 2023-12-20 DIAGNOSIS — R531 Weakness: Secondary | ICD-10-CM | POA: Diagnosis not present

## 2023-12-20 LAB — CBC
HCT: 16.5 % — ABNORMAL LOW (ref 36.0–46.0)
Hemoglobin: 4.7 g/dL — CL (ref 12.0–15.0)
MCH: 22.8 pg — ABNORMAL LOW (ref 26.0–34.0)
MCHC: 28.5 g/dL — ABNORMAL LOW (ref 30.0–36.0)
MCV: 80.1 fL (ref 80.0–100.0)
Platelets: 190 10*3/uL (ref 150–400)
RBC: 2.06 MIL/uL — ABNORMAL LOW (ref 3.87–5.11)
RDW: 17.2 % — ABNORMAL HIGH (ref 11.5–15.5)
WBC: 8.8 10*3/uL (ref 4.0–10.5)
nRBC: 0 % (ref 0.0–0.2)

## 2023-12-20 LAB — CBC WITH DIFFERENTIAL/PLATELET
Basophils Absolute: 0.1 10*3/uL (ref 0.0–0.1)
Basophils Relative: 0.7 % (ref 0.0–3.0)
Eosinophils Absolute: 0 10*3/uL (ref 0.0–0.7)
Eosinophils Relative: 0.1 % (ref 0.0–5.0)
HCT: 17.4 % — CL (ref 36.0–46.0)
Hemoglobin: 5.2 g/dL — CL (ref 12.0–15.0)
Lymphocytes Relative: 13.4 % (ref 12.0–46.0)
Lymphs Abs: 1.2 10*3/uL (ref 0.7–4.0)
MCHC: 29.8 g/dL — ABNORMAL LOW (ref 30.0–36.0)
MCV: 73.8 fl — ABNORMAL LOW (ref 78.0–100.0)
Monocytes Absolute: 0.9 10*3/uL (ref 0.1–1.0)
Monocytes Relative: 10.1 % (ref 3.0–12.0)
Neutro Abs: 6.6 10*3/uL (ref 1.4–7.7)
Neutrophils Relative %: 75.7 % (ref 43.0–77.0)
Platelets: 223 10*3/uL (ref 150.0–400.0)
RBC: 2.36 Mil/uL — ABNORMAL LOW (ref 3.87–5.11)
RDW: 18.8 % — ABNORMAL HIGH (ref 11.5–15.5)
WBC: 8.7 10*3/uL (ref 4.0–10.5)

## 2023-12-20 LAB — HEPATIC FUNCTION PANEL
ALT: 16 U/L (ref 0–35)
AST: 25 U/L (ref 0–37)
Albumin: 3.4 g/dL — ABNORMAL LOW (ref 3.5–5.2)
Alkaline Phosphatase: 133 U/L — ABNORMAL HIGH (ref 39–117)
Bilirubin, Direct: 0.3 mg/dL (ref 0.0–0.3)
Total Bilirubin: 0.8 mg/dL (ref 0.2–1.2)
Total Protein: 7.3 g/dL (ref 6.0–8.3)

## 2023-12-20 LAB — COMPREHENSIVE METABOLIC PANEL WITH GFR
ALT: 18 U/L (ref 0–44)
AST: 29 U/L (ref 15–41)
Albumin: 2.7 g/dL — ABNORMAL LOW (ref 3.5–5.0)
Alkaline Phosphatase: 114 U/L (ref 38–126)
Anion gap: 12 (ref 5–15)
BUN: 14 mg/dL (ref 8–23)
CO2: 25 mmol/L (ref 22–32)
Calcium: 8 mg/dL — ABNORMAL LOW (ref 8.9–10.3)
Chloride: 94 mmol/L — ABNORMAL LOW (ref 98–111)
Creatinine, Ser: 0.86 mg/dL (ref 0.44–1.00)
GFR, Estimated: >60 mL/min (ref >60)
Glucose, Bld: 111 mg/dL — ABNORMAL HIGH (ref 70–99)
Potassium: 3.8 mmol/L (ref 3.5–5.1)
Sodium: 131 mmol/L — ABNORMAL LOW (ref 135–145)
Total Bilirubin: 0.9 mg/dL (ref 0.0–1.2)
Total Protein: 6.8 g/dL (ref 6.5–8.1)

## 2023-12-20 LAB — PROTIME-INR
INR: 1.3 ratio — ABNORMAL HIGH (ref 0.8–1.0)
Prothrombin Time: 14.1 s — ABNORMAL HIGH (ref 9.6–13.1)

## 2023-12-20 LAB — BASIC METABOLIC PANEL WITH GFR
BUN: 15 mg/dL (ref 6–23)
CO2: 32 meq/L (ref 19–32)
Calcium: 8.7 mg/dL (ref 8.4–10.5)
Chloride: 95 meq/L — ABNORMAL LOW (ref 96–112)
Creatinine, Ser: 0.79 mg/dL (ref 0.40–1.20)
GFR: 77.26 mL/min (ref >60.00)
Glucose, Bld: 80 mg/dL (ref 70–99)
Potassium: 3.3 meq/L — ABNORMAL LOW (ref 3.5–5.1)
Sodium: 134 meq/L — ABNORMAL LOW (ref 135–145)

## 2023-12-20 LAB — POC OCCULT BLOOD, ED: Fecal Occult Bld: POSITIVE — AB

## 2023-12-20 LAB — PREPARE RBC (CROSSMATCH)

## 2023-12-20 LAB — FERRITIN: Ferritin: 13.7 ng/mL (ref 10.0–291.0)

## 2023-12-20 MED ORDER — MIDODRINE HCL 5 MG PO TABS
15.0000 mg | ORAL_TABLET | Freq: Three times a day (TID) | ORAL | Status: DC
  Administered 2023-12-20 – 2023-12-23 (×8): 15 mg via ORAL
  Filled 2023-12-20 (×8): qty 3

## 2023-12-20 MED ORDER — ALBUMIN HUMAN 25 % IV SOLN
25.0000 g | INTRAVENOUS | Status: AC
  Administered 2023-12-20: 12.5 g via INTRAVENOUS
  Filled 2023-12-20: qty 100

## 2023-12-20 MED ORDER — ACETAMINOPHEN 500 MG PO TABS: 500.0000 mg | ORAL_TABLET | Freq: Four times a day (QID) | ORAL | Status: DC | PRN

## 2023-12-20 MED ORDER — OCTREOTIDE LOAD VIA INFUSION
50.0000 ug | Freq: Once | INTRAVENOUS | Status: AC
  Administered 2023-12-20: 50 ug via INTRAVENOUS
  Filled 2023-12-20: qty 25

## 2023-12-20 MED ORDER — PANTOPRAZOLE SODIUM 40 MG IV SOLR
80.0000 mg | Freq: Once | INTRAVENOUS | Status: AC
  Administered 2023-12-20: 80 mg via INTRAVENOUS
  Filled 2023-12-20: qty 20

## 2023-12-20 MED ORDER — SODIUM CHLORIDE 0.9 % IV SOLN
50.0000 ug/h | INTRAVENOUS | Status: DC
  Administered 2023-12-20 – 2023-12-22 (×4): 50 ug/h via INTRAVENOUS
  Filled 2023-12-20 (×5): qty 1

## 2023-12-20 MED ORDER — SODIUM CHLORIDE 0.9 % IV SOLN
1.0000 g | Freq: Once | INTRAVENOUS | Status: DC
  Administered 2023-12-20: 1 g via INTRAVENOUS
  Filled 2023-12-20: qty 10

## 2023-12-20 MED ORDER — SODIUM CHLORIDE 0.9 % IV SOLN
2.0000 g | INTRAVENOUS | Status: DC
  Administered 2023-12-21 – 2023-12-22 (×2): 2 g via INTRAVENOUS
  Filled 2023-12-20 (×2): qty 20

## 2023-12-20 MED ORDER — PANTOPRAZOLE SODIUM 40 MG PO TBEC: 40.0000 mg | DELAYED_RELEASE_TABLET | Freq: Two times a day (BID) | ORAL | 1 refills | Status: DC

## 2023-12-20 MED ORDER — SODIUM CHLORIDE 0.9 % IV SOLN: 2.0000 g | INTRAVENOUS | Status: DC

## 2023-12-20 MED ORDER — SODIUM CHLORIDE 0.9 % IV SOLN: 1.0000 g | Freq: Once | INTRAVENOUS | Status: DC

## 2023-12-20 MED ORDER — MELATONIN 5 MG PO TABS
5.0000 mg | ORAL_TABLET | Freq: Every evening | ORAL | Status: DC | PRN
  Administered 2023-12-22 (×2): 5 mg via ORAL
  Filled 2023-12-20 (×2): qty 1

## 2023-12-20 MED ORDER — MIDODRINE HCL 5 MG PO TABS: 15.0000 mg | ORAL_TABLET | Freq: Three times a day (TID) | ORAL | 3 refills | Status: AC

## 2023-12-20 MED ORDER — SODIUM CHLORIDE 0.9% IV SOLUTION: Freq: Once | INTRAVENOUS | Status: AC

## 2023-12-20 MED ORDER — PANTOPRAZOLE SODIUM 40 MG IV SOLR: 40.0000 mg | Freq: Two times a day (BID) | INTRAVENOUS | Status: DC

## 2023-12-20 MED ORDER — SPIRONOLACTONE 100 MG PO TABS: 100.0000 mg | ORAL_TABLET | Freq: Every day | ORAL | 1 refills | Status: DC

## 2023-12-20 MED ORDER — ATORVASTATIN CALCIUM 10 MG PO TABS: 10.0000 mg | ORAL_TABLET | Freq: Every day | ORAL | 3 refills | Status: AC

## 2023-12-20 MED ORDER — FUROSEMIDE 40 MG PO TABS: 40.0000 mg | ORAL_TABLET | Freq: Every day | ORAL | 1 refills | Status: DC

## 2023-12-20 MED ORDER — PANTOPRAZOLE SODIUM 40 MG IV SOLR
40.0000 mg | Freq: Two times a day (BID) | INTRAVENOUS | Status: DC
  Administered 2023-12-21 – 2023-12-23 (×5): 40 mg via INTRAVENOUS
  Filled 2023-12-20 (×5): qty 10

## 2023-12-20 MED ORDER — POLYETHYLENE GLYCOL 3350 17 G PO PACK: 17.0000 g | PACK | Freq: Every day | ORAL | Status: DC | PRN

## 2023-12-20 MED ORDER — PROCHLORPERAZINE EDISYLATE 10 MG/2ML IJ SOLN: 5.0000 mg | Freq: Four times a day (QID) | INTRAMUSCULAR | Status: DC | PRN

## 2023-12-20 NOTE — Assessment & Plan Note (Signed)
 Not on meds currently and seeing GI in 1-2 weeks.

## 2023-12-20 NOTE — Patient Instructions (Addendum)
 Try adding metamucil or benefiber daily to see if this helps with the bowels.   We have sent in the medicine refills. When you run out of spironolactone  25 mg tablets make sure you check the new bottle. We have sent in 100 mg tablets so you will only need to take 1 pill daily instead of the 4 (25 mg tablets) you are taking now. This will not change your dose per day.   We are checking the labs today. Start weighing yourself daily and let us  know if the weight is up more than 3 pounds.

## 2023-12-20 NOTE — Assessment & Plan Note (Addendum)
 Upon review after visit noted this abnormal chest x-ray finding. No mention on d/c summary or plan during hospital course for retrocardiac opacity. She is a smoker and overdue for follow up lung nodule (right sided 5 mm, she never went for CT scan last year). Will contact patient and find out if she wants CT scan to assess.

## 2023-12-20 NOTE — Progress Notes (Signed)
   Subjective:   Patient ID: Sierra Logan, female    DOB: 01-26-1956, 68 y.o.   MRN: [id number]  HPI The patient is a 68 YO female coming in for follow up leaving SNF  5/25 left. after hospital stay (for decompensated cirrhosis with paracentesis and thoracentesis, GI bleed during hospital stay and transfusion) Labs reviewed from 5/13 SNF with stable CMP and CBC. Furosemide  40 mg daily. Not checking weight since leaving SNF and is taking medications as listed. She is taking 4 pills of 25 mg spironolactone  instead of 1 100 mg pill. Is having dizziness/lightheadedness which is worst in the morning and by afternoon it is not as bad. Denies confusion or falls. She is tired. Appetite is okay but not great. Having constipation alternating with diarrhea. She is taking imodium and something else she cannot recall. Not taking in the past few days. Denies blood in stool.   PMH, [facility], social history reviewed and updated  Discharge medication list from hospital and facility reviewed and updated during visit.   Review of Systems  Constitutional:  Positive for activity change, appetite change and fatigue.  HENT: Negative.    Eyes: Negative.   Respiratory:  Positive for shortness of breath. Negative for cough and chest tightness.   Cardiovascular:  Negative for chest pain, palpitations and leg swelling.  Gastrointestinal:  Negative for abdominal distention, abdominal pain, constipation, diarrhea, nausea and vomiting.  Musculoskeletal: Negative.   Skin: Negative.   Neurological: Negative.   Psychiatric/Behavioral: Negative.      Objective:  Physical Exam Constitutional:      Appearance: She is well-developed.  HENT:     Head: Normocephalic and atraumatic.  Cardiovascular:     Rate and Rhythm: Normal rate and regular rhythm.  Pulmonary:     Effort: Pulmonary effort is normal. No respiratory distress.     Breath sounds: Normal breath sounds. No wheezing or rales.     Comments: Oxygen in  place Abdominal:     General: Bowel sounds are normal. There is no distension.     Palpations: Abdomen is soft.     Tenderness: There is no abdominal tenderness. There is no rebound.  Musculoskeletal:     Cervical back: Normal range of motion.     Right lower leg: Edema present.     Left lower leg: Edema present.  Skin:    General: Skin is warm and dry.  Neurological:     Mental Status: She is alert and oriented to person, place, and time.     Coordination: Coordination normal.     Vitals:   12/20/23 1019  BP: 96/68  Pulse: (!) 122  Temp: 98.3 F (36.8 C)  TempSrc: Oral  SpO2: 99%  Weight: 157 lb (71.2 kg)  Height: 5\' 2"  (1.575 m)    Assessment & Plan:  Visit time 30 minutes in face to face communication with patient and coordination of care, additional 20 minutes spent in record review, coordination or care, ordering tests, communicating/referring to other healthcare professionals, documenting in medical records all on the same day of the visit for total time 50 minutes spent on the visit.

## 2023-12-20 NOTE — Telephone Encounter (Signed)
 Advise her to go to ER for low blood counts. She will likely need repeat blood transfusion.

## 2023-12-20 NOTE — Telephone Encounter (Signed)
 Patient was seen today in office with PCP

## 2023-12-20 NOTE — ED Triage Notes (Signed)
 Patient c/o generalized weakness.  Patient had an appointment this morning with her PCP and had labs and was told her hgb was "critical" and she needed a blood transfusion.  Patient denies any GI bleeding.  Patient gives verbal consent for MSE.

## 2023-12-20 NOTE — Assessment & Plan Note (Signed)
 With varices and low BP unable to start beta blocker today.

## 2023-12-20 NOTE — Telephone Encounter (Signed)
 Called and gave ok verbals per provider

## 2023-12-20 NOTE — Assessment & Plan Note (Addendum)
 She is taking protonix  40 mg BID and refilled. Checking CBC, ferritin. No blood in stool.

## 2023-12-20 NOTE — Telephone Encounter (Signed)
 Called patient and advised her to go back to ER per provider and patient verbalized she understood

## 2023-12-20 NOTE — Assessment & Plan Note (Signed)
 Hg reviewed from care facility 5/13 was 8.3 which is slight improvement since discharge from hospital. Checking CBC and ferritin today.

## 2023-12-20 NOTE — Telephone Encounter (Signed)
 Ok for [employer]

## 2023-12-20 NOTE — Telephone Encounter (Signed)
 Copied from CRM [id number]. Topic: Clinical - Medical Advice >> Dec 20, 2023  2:51 PM Magdalene School wrote: Reason for CRM: Natavia calling from [facility] to report patient's Heart rate was 133 and was having shortness of breath when walking. Oxygen was at 100%.

## 2023-12-20 NOTE — Assessment & Plan Note (Signed)
 Using oxygen since discharge and suspect that she has low blood levels which contribute so as this climbs she may be able to reduce oxygen requirements. Follow up 1 month.

## 2023-12-20 NOTE — ED Notes (Signed)
 Albumin  started at this time. Pump is making it unable to begin 25 mg albumin . 12.5 mg albumin  started at this time and will start the other 12.5 mg after first infusion finishes.

## 2023-12-20 NOTE — H&P (Signed)
 History and Physical  Sierra Logan DGU:[id number] DOB: 1956-05-02 DOA: 12/20/2023  Referring physician: Dr. Reba Camper, EDP  PCP: Adelia Homestead, MD  Outpatient Specialists: [facility]. Patient coming from: Home through PCPs office  Chief Complaint: Abnormal lab results.  HPI: Sierra Logan is a 68 y.o. female with medical history significant for decompensated cirrhosis, esophageal varices seen on EGD 11/09/2023, history of paracentesis and thoracentesis, history of GI bleed requiring blood transfusion, who initially presented to her PCPs office today as a follow-up after discharge from SNF.  She reported tiredness, generalized fatigue and lightheadedness worse in the morning associated with loose stools.  She had CBC done in her PCPs office which returned with significant drop in her hemoglobin 5.2 from 7.7 on 11/23/2023.  Due to concern for symptomatic anemia and possible recurrent GI bleed the patient was advised to go to the ER for blood transfusion and further evaluation.  Endorses dark stools noted about a week ago.  In the ER, tachycardic with soft BPs.  Repeated hemoglobin 4.7.  FOBT positive.  2 unit PRBCs ordered to be transfused by EDP.  EDP discussed the case with [facility], Dr. Willy Harvest, who recommended in addition to blood transfusion, IV Rocephin , octreotide  drip and Protonix  twice daily.  Admitted by [facility], hospitalist service.  ED Course: Temperature 98.5.  BP 100/36, pulse 79, respiration rate 20, O2 saturation 100% on 3 L.  Lab studies notable for hemoglobin 4.7, MCV 80, WBC 8.8, platelet count 190.  Serum sodium 131, glucose 111, BUN 14, creatinine 0.86, albumin  2.7, GFR greater than 60.  Review of Systems: Review of systems as noted in the HPI. All other systems reviewed and are negative.   Past Medical History:  Diagnosis Date   Cirrhosis (HCC)    Diabetes mellitus without complication (HCC)    Hyperlipemia    Hypertension    Neuropathy    Spleen disease    Past  Surgical History:  Procedure Laterality Date   BIOPSY  10/20/2020   Procedure: BIOPSY;  Surgeon: Brice Campi Albino Alu., MD;  Location: [facility];  Service: Gastroenterology;;   COLONOSCOPY WITH PROPOFOL  N/A 10/20/2020   Procedure: COLONOSCOPY WITH PROPOFOL ;  Surgeon: Normie Becton., MD;  Location: [facility];  Service: Gastroenterology;  Laterality: N/A;   ESOPHAGOGASTRODUODENOSCOPY N/A 11/09/2023   Procedure: EGD (ESOPHAGOGASTRODUODENOSCOPY);  Surgeon: Nandigam, Kavitha V, MD;  Location: [facility] ENDOSCOPY;  Service: Gastroenterology;  Laterality: N/A;   ESOPHAGOGASTRODUODENOSCOPY (EGD) WITH PROPOFOL  N/A 10/20/2020   Procedure: ESOPHAGOGASTRODUODENOSCOPY (EGD) WITH PROPOFOL ;  Surgeon: Brice Campi Albino Alu., MD;  Location: [facility] ENDOSCOPY;  Service: Gastroenterology;  Laterality: N/A;   HOT HEMOSTASIS N/A 11/09/2023   Procedure: EGD, WITH ARGON PLASMA COAGULATION;  Surgeon: Sergio Dandy, MD;  Location: [facility] ENDOSCOPY;  Service: Gastroenterology;  Laterality: N/A;   INTRAOPERATIVE TRANSTHORACIC ECHOCARDIOGRAM  10/17/2020   Procedure: TRANSTHORACIC ECHOCARDIOGRAM;  Surgeon: Elmyra Haggard, MD;  Location: [facility] ENDOSCOPY;  Service: Cardiovascular;;   IR PARACENTESIS  11/10/2023   IR THORACENTESIS ASP PLEURAL SPACE W/IMG GUIDE  11/21/2023   POLYPECTOMY  10/20/2020   Procedure: POLYPECTOMY;  Surgeon: Brice Campi Albino Alu., MD;  Location: [facility];  Service: Gastroenterology;;   RADIOFREQUENCY ABLATION  10/20/2020   Procedure: RADIO FREQUENCY ABLATION;  Surgeon: Normie Becton., MD;  Location: [facility] ENDOSCOPY;  Service: Gastroenterology;;   RECONSTRUCTION OF NOSE     TRACHEOSTOMY     TRACHEOSTOMY CLOSURE      Social History:  reports that she has been smoking cigarettes. She started smoking about  56 years ago. She has a 28.2 pack-year smoking history. She has never used smokeless tobacco. She reports that she does not currently use alcohol. She reports that she does not use  drugs.   No Known Allergies  Family History  Problem Relation Age of Onset   GER disease Mother    Heart disease Mother    Diabetes Father    Hypertension Sister    Hypertension Brother    Prostate cancer Maternal Grandfather    Hypertension Brother    Hypertension Brother    Hypertension Brother    Colon cancer Neg Hx    Esophageal cancer Neg Hx    Kidney disease Neg Hx    Liver disease Neg Hx    Pancreatic cancer Neg Hx    Rectal cancer Neg Hx    Stomach cancer Neg Hx       Prior to Admission medications   Medication Sig Start Date End Date Taking? Authorizing Provider  acetaminophen  (TYLENOL ) 500 MG tablet Take 1 tablet (500 mg total) by mouth every 6 (six) hours as needed for mild pain (pain score 1-3) (or Fever >/= 101). 11/23/23   Abbe Abate, MD  atorvastatin  (LIPITOR) 10 MG tablet Take 1 tablet (10 mg total) by mouth at bedtime. 12/20/23   Adelia Homestead, MD  BIOTIN PO Take 1 tablet by mouth daily with breakfast.    [provider]  Calcium  Carb-Cholecalciferol (CALCIUM +D3 PO) Take 1 tablet by mouth daily after breakfast.    [provider]  furosemide  (LASIX ) 40 MG tablet Take 1 tablet (40 mg total) by mouth daily. 12/20/23   Adelia Homestead, MD  gabapentin  (NEURONTIN ) 400 MG capsule TAKE 1 CAPSULE EVERY MORNING AND 2 AT BEDTIME Patient taking differently: Take 400 mg by mouth 2 (two) times daily. TAKE 1 CAPSULE EVERY MORNING AND 2 AT BEDTIME 04/18/23   Adelia Homestead, MD  midodrine  (PROAMATINE ) 5 MG tablet Take 3 tablets (15 mg total) by mouth 3 (three) times daily with meals. 12/20/23   Adelia Homestead, MD  Omega-3 Fatty Acids (FISH OIL PO) Take 1 capsule by mouth daily with breakfast.    [provider]  pantoprazole  (PROTONIX ) 40 MG tablet Take 1 tablet (40 mg total) by mouth 2 (two) times daily. 12/20/23   Adelia Homestead, MD  spironolactone  (ALDACTONE ) 100 MG tablet Take 1 tablet (100 mg total) by mouth daily.  12/20/23   Adelia Homestead, MD    Physical Exam: BP (!) 116/37 (BP Location: Right Arm)   Pulse 86   Temp 98.5 F (36.9 C)   Resp 20   Wt 71.2 kg   SpO2 99%   BMI 28.71 kg/m   General: 68 y.o. year-old female well developed well nourished in no acute distress.  Alert and oriented x3. Cardiovascular: Regular rate and rhythm with no rubs or gallops.  No thyromegaly or JVD noted.  No lower extremity edema. 2/4 pulses in all 4 extremities. Respiratory: Clear to auscultation with no wheezes or rales. Good inspiratory effort. Abdomen: Soft nontender nondistended with normal bowel sounds x4 quadrants. Muskuloskeletal: No cyanosis, clubbing or edema noted bilaterally Neuro: CN II-XII intact, strength, sensation, reflexes Skin: No ulcerative lesions noted or rashes Psychiatry: Judgement and insight appear normal. Mood is appropriate for condition and setting          Labs on Admission:  Basic Metabolic Panel: Recent Labs  Lab 12/20/23 1130 12/20/23 1627  NA 134* 131*  K 3.3* 3.8  CL 95* 94*  CO2 32 25  GLUCOSE 80 111*  BUN 15 14  CREATININE 0.79 0.86  CALCIUM  8.7 8.0*   Liver Function Tests: Recent Labs  Lab 12/20/23 1130 12/20/23 1627  AST 25 29  ALT 16 18  ALKPHOS 133* 114  BILITOT 0.8 0.9  PROT 7.3 6.8  ALBUMIN  3.4* 2.7*   No results for input(s): "LIPASE", "AMYLASE" in the last 168 hours. No results for input(s): "AMMONIA" in the last 168 hours. CBC: Recent Labs  Lab 12/20/23 1130 12/20/23 1627  WBC 8.7 8.8  NEUTROABS 6.6  --   HGB 5.2 Repeated and verified X2.* 4.7*  HCT 17.4 Repeated and verified X2.* 16.5*  MCV 73.8* 80.1  PLT 223.0 190   Cardiac Enzymes: No results for input(s): "CKTOTAL", "CKMB", "CKMBINDEX", "TROPONINI" in the last 168 hours.  BNP (last 3 results) Recent Labs    11/07/23 1607  BNP 101.4*    ProBNP (last 3 results) No results for input(s): "PROBNP" in the last 8760 hours.  CBG: No results for input(s): "GLUCAP" in  the last 168 hours.  Radiological Exams on Admission: DG Chest Port 1 View Result Date: 12/20/2023 CLINICAL DATA:  Weakness EXAM: PORTABLE CHEST 1 VIEW COMPARISON:  Chest x-ray 11/21/2023 FINDINGS: The heart size and mediastinal contours are within normal limits. Both lungs are clear. The visualized skeletal structures are unremarkable. IMPRESSION: No active disease. Electronically Signed   By: Tyron Gallon M.D.   On: 12/20/2023 19:20    EKG: I independently viewed the EKG done and my findings are as followed: Sinus rhythm rate of 90.  Nonspecific ST-T changes.  QTc 486.  Assessment/Plan Present on Admission:  Symptomatic anemia  Principal Problem:   Symptomatic anemia  Symptomatic anemia Decompensated cirrhosis with esophageal varices seen on EGD 11/09/2023 Hemoglobin on presentation 4.7 MCV 80 Positive FOBT 2 unit PRBCs ordered to be transfused. IV Rocephin  for SBP prophylaxis Octreotide  drip and IV Protonix  twice daily, as recommended by GI. Repeat CBC post blood transfusion.  Chronic hypotension in the setting of decompensated cirrhosis Complicated by acute blood loss anemia Maintain MAP greater than 65 Add IV albumin  25 g x 1 Resume home midodrine   Decompensated cirrhosis with esophageal varices Followed by [facility] per GI  Euvolemic hyponatremia Serum sodium 131 Encourage oral protein calorie intake as tolerated.  Generalized weakness, likely multifactorial Secondary to advanced cirrhosis and symptomatic anemia Continue to treat underlying conditions Continue fall precautions.     Critical care time: 55 minutes.    DVT prophylaxis: SCDs.  Code Status: Full code.  Family Communication: None at bedside.  Disposition Plan: Admitted to progressive care unit.  Consults called: [facility].  Admission status: Inpatient status.   Status is: Inpatient The patient requires at least 2 midnights for further evaluation and treatment of present  condition.   Bary Boss MD [facility] Pager [phone]  If 7PM-7AM, please contact night-coverage [email or web address] Password [facility]  12/20/2023, 7:26 PM

## 2023-12-20 NOTE — ED Provider Notes (Signed)
 [place] EMERGENCY DEPARTMENT AT [facility] Provider Note   CSN: [id number] Arrival date & time: 12/20/23  1608     History  Chief Complaint  Patient presents with   Weakness    Sierra Logan is a 68 y.o. female.  68 year old female with HTN, HLD, DM2, and cirrhosis with esophageal varices presents with weakness/anemia.  PCP performed labs earlier today.  Patient denies recent GI bleeding.  Patient's hemoglobin was 5.2 -recent hemoglobin was 7.7.    The history is provided by the patient.       Home Medications Prior to Admission medications   Medication Sig Start Date End Date Taking? Authorizing Provider  acetaminophen  (TYLENOL ) 500 MG tablet Take 1 tablet (500 mg total) by mouth every 6 (six) hours as needed for mild pain (pain score 1-3) (or Fever >/= 101). 11/23/23   Abbe Abate, MD  atorvastatin  (LIPITOR) 10 MG tablet Take 1 tablet (10 mg total) by mouth at bedtime. 12/20/23   Adelia Homestead, MD  BIOTIN PO Take 1 tablet by mouth daily with breakfast.    [provider]  Calcium  Carb-Cholecalciferol (CALCIUM +D3 PO) Take 1 tablet by mouth daily after breakfast.    [provider]  furosemide  (LASIX ) 40 MG tablet Take 1 tablet (40 mg total) by mouth daily. 12/20/23   Adelia Homestead, MD  gabapentin  (NEURONTIN ) 400 MG capsule TAKE 1 CAPSULE EVERY MORNING AND 2 AT BEDTIME Patient taking differently: Take 400 mg by mouth 2 (two) times daily. TAKE 1 CAPSULE EVERY MORNING AND 2 AT BEDTIME 04/18/23   Adelia Homestead, MD  midodrine  (PROAMATINE ) 5 MG tablet Take 3 tablets (15 mg total) by mouth 3 (three) times daily with meals. 12/20/23   Adelia Homestead, MD  Omega-3 Fatty Acids (FISH OIL PO) Take 1 capsule by mouth daily with breakfast.    [provider]  pantoprazole  (PROTONIX ) 40 MG tablet Take 1 tablet (40 mg total) by mouth 2 (two) times daily. 12/20/23   Adelia Homestead, MD  spironolactone  (ALDACTONE ) 100 MG  tablet Take 1 tablet (100 mg total) by mouth daily. 12/20/23   Adelia Homestead, MD      Allergies    Patient has no known allergies.    Review of Systems   Review of Systems  All other systems reviewed and are negative.   Physical Exam Updated Vital Signs BP (!) 116/37 (BP Location: Right Arm)   Pulse 86   Temp 98.5 F (36.9 C)   Resp 20   Wt 71.2 kg   SpO2 99%   BMI 28.71 kg/m  Physical Exam Vitals and nursing note reviewed.  Constitutional:      General: She is not in acute distress.    Appearance: Normal appearance. She is well-developed.  HENT:     Head: Normocephalic and atraumatic.  Eyes:     Conjunctiva/sclera: Conjunctivae normal.     Pupils: Pupils are equal, round, and reactive to light.  Cardiovascular:     Rate and Rhythm: Normal rate and regular rhythm.     Heart sounds: Normal heart sounds.  Pulmonary:     Effort: Pulmonary effort is normal. No respiratory distress.     Breath sounds: Normal breath sounds.  Abdominal:     General: There is no distension.     Palpations: Abdomen is soft.     Tenderness: There is no abdominal tenderness.  Genitourinary:    Comments: Brown stool present on DRE.  Guaiac positive. Musculoskeletal:        General: No deformity. Normal range of motion.     Cervical back: Normal range of motion and neck supple.  Skin:    General: Skin is warm and dry.  Neurological:     General: No focal deficit present.     Mental Status: She is alert and oriented to person, place, and time.     ED Results / Procedures / Treatments   Labs (all labs ordered are listed, but only abnormal results are displayed) Labs Reviewed  COMPREHENSIVE METABOLIC PANEL WITH GFR - Abnormal; Notable for the following components:      Result Value   Sodium 131 (*)    Chloride 94 (*)    Glucose, Bld 111 (*)    Calcium  8.0 (*)    Albumin  2.7 (*)    All other components within normal limits  CBC - Abnormal; Notable for the following components:    RBC 2.06 (*)    Hemoglobin 4.7 (*)    HCT 16.5 (*)    MCH 22.8 (*)    MCHC 28.5 (*)    RDW 17.2 (*)    All other components within normal limits  POC OCCULT BLOOD, ED - Abnormal; Notable for the following components:   Fecal Occult Bld POSITIVE (*)    All other components within normal limits  TYPE AND SCREEN  PREPARE RBC (CROSSMATCH)    EKG EKG Interpretation Date/Time:  Tuesday December 20 2023 16:20:13 EDT Ventricular Rate:  90 PR Interval:  122 QRS Duration:  88 QT Interval:  398 QTC Calculation: 486 R Axis:   25  Text Interpretation: Normal sinus rhythm Cannot rule out Inferior infarct , age undetermined Cannot rule out Anterior infarct , age undetermined Abnormal ECG When compared with ECG of 08-Nov-2023 02:55, PREVIOUS ECG IS PRESENT Confirmed by Angela Kell [phone]) on 12/20/2023 5:19:49 PM  Radiology No results found.  Procedures Procedures    Medications Ordered in ED Medications  0.9 %  sodium chloride  infusion (Manually program via Guardrails IV Fluids) (has no administration in time range)  pantoprazole  (PROTONIX ) injection 80 mg (has no administration in time range)    ED Course/ Medical Decision Making/ A&P                                 Medical Decision Making Amount and/or Complexity of Data Reviewed Labs: ordered. Radiology: ordered.  Risk Prescription drug management.    Medical Screen Complete  This patient presented to the ED with complaint of weakness, anemia.  This complaint involves an extensive number of treatment options. The initial differential diagnosis includes, but is not limited to, anemia, GI bleed, metabolic abnormality, etc.  This presentation is: Acute, Chronic, Self-Limited, Previously Undiagnosed, Uncertain Prognosis, Complicated, Systemic Symptoms, and Threat to Life/Bodily Function  Patient is presenting with worsening anemia.  She is symptomatic with reported significant weakness.  She denies hematemesis or bloody  stool.  She is guaiac positive with brown stool.  Hemoglobin is 4.7.  Hemodynamics are stable.    Transfusion initiated.  Protonix  initiated.  Octreotide  initiated.  Rocephin  administered.  Case discussed with Dr. Willy Harvest, [facility] who will consult.  Patient will require admission.  Hospitalist service made aware of case.  Additional history obtained:  Additional history obtained from [facility] External records from outside sources obtained and reviewed including prior ED visits and prior Inpatient records.    Problem  List / ED Course:  Anemia, GI bleeding     Disposition:  After consideration of the diagnostic results and the patients response to treatment, I feel that the patent would benefit from admission.   CRITICAL CARE Performed by: Burnette Carte   Total critical care time: 30 minutes  Critical care time was exclusive of separately billable procedures and treating other patients.  Critical care was necessary to treat or prevent imminent or life-threatening deterioration.  Critical care was time spent personally by me on the following activities: development of treatment plan with patient and/or surrogate as well as nursing, discussions with consultants, evaluation of patient's response to treatment, examination of patient, obtaining history from patient or surrogate, ordering and performing treatments and interventions, ordering and review of laboratory studies, ordering and review of radiographic studies, pulse oximetry and re-evaluation of patient's condition.          Final Clinical Impression(s) / ED Diagnoses Final diagnoses:  Anemia, unspecified type  Gastrointestinal hemorrhage, unspecified gastrointestinal hemorrhage type    Rx / DC Orders ED Discharge Orders     None         Burnette Carte, MD 12/20/23 1904

## 2023-12-20 NOTE — Assessment & Plan Note (Addendum)
 Not decompensated today. She is taking lasix  40 mg daily and spironolactone  100 mg daily for fluid retention and 2+ edema bilaterally to mid shins. No ascites. No change in SOB to suggest recurrent pulmonary edema. She is seeing GI in 1-2 weeks and needs to re-establish care with them. She is taking midodrine  15 mg TID for hypotension. Cannot tolerate addition of beta blocker today. Checking CMP, CBC, pt/inr today.

## 2023-12-20 NOTE — Telephone Encounter (Signed)
 CRITICAL VALUE STICKER  CRITICAL VALUE: hemoglobin 5.2 and hematocrit 17.4   RECEIVER (on-site recipient of call): Sierra Logan  DATE & TIME NOTIFIED: 12/20/23 at 2:56 pm  MESSENGER (representative from lab): Camilo Cella   MD NOTIFIED: Dr. Nicolette Barrio

## 2023-12-21 ENCOUNTER — Ambulatory Visit: Payer: Self-pay | Admitting: Internal Medicine

## 2023-12-21 DIAGNOSIS — E871 Hypo-osmolality and hyponatremia: Secondary | ICD-10-CM

## 2023-12-21 DIAGNOSIS — K7460 Unspecified cirrhosis of liver: Secondary | ICD-10-CM

## 2023-12-21 DIAGNOSIS — K766 Portal hypertension: Secondary | ICD-10-CM

## 2023-12-21 DIAGNOSIS — I8511 Secondary esophageal varices with bleeding: Secondary | ICD-10-CM

## 2023-12-21 DIAGNOSIS — K743 Primary biliary cirrhosis: Secondary | ICD-10-CM

## 2023-12-21 DIAGNOSIS — K31819 Angiodysplasia of stomach and duodenum without bleeding: Secondary | ICD-10-CM

## 2023-12-21 DIAGNOSIS — R188 Other ascites: Secondary | ICD-10-CM | POA: Diagnosis not present

## 2023-12-21 DIAGNOSIS — D62 Acute posthemorrhagic anemia: Secondary | ICD-10-CM

## 2023-12-21 DIAGNOSIS — I8500 Esophageal varices without bleeding: Secondary | ICD-10-CM

## 2023-12-21 DIAGNOSIS — K7290 Hepatic failure, unspecified without coma: Secondary | ICD-10-CM

## 2023-12-21 DIAGNOSIS — D649 Anemia, unspecified: Secondary | ICD-10-CM | POA: Diagnosis not present

## 2023-12-21 DIAGNOSIS — K3189 Other diseases of stomach and duodenum: Secondary | ICD-10-CM

## 2023-12-21 LAB — BPAM RBC
Blood Product Expiration Date: 202507102359
Unit Type and Rh: 5100

## 2023-12-21 LAB — COMPREHENSIVE METABOLIC PANEL WITH GFR
ALT: 17 U/L (ref 0–44)
AST: 25 U/L (ref 15–41)
Albumin: 2.5 g/dL — ABNORMAL LOW (ref 3.5–5.0)
Alkaline Phosphatase: 104 U/L (ref 38–126)
Anion gap: 10 (ref 5–15)
BUN: 14 mg/dL (ref 8–23)
CO2: 27 mmol/L (ref 22–32)
Calcium: 7.8 mg/dL — ABNORMAL LOW (ref 8.9–10.3)
Chloride: 97 mmol/L — ABNORMAL LOW (ref 98–111)
Creatinine, Ser: 0.83 mg/dL (ref 0.44–1.00)
GFR, Estimated: >60 mL/min (ref >60)
Glucose, Bld: 79 mg/dL (ref 70–99)
Potassium: 3.5 mmol/L (ref 3.5–5.1)
Sodium: 134 mmol/L — ABNORMAL LOW (ref 135–145)
Total Bilirubin: 1.2 mg/dL (ref 0.0–1.2)
Total Protein: 6.5 g/dL (ref 6.5–8.1)

## 2023-12-21 LAB — HEMOGLOBIN AND HEMATOCRIT, BLOOD
HCT: 24.9 % — ABNORMAL LOW (ref 36.0–46.0)
Hemoglobin: 7.8 g/dL — ABNORMAL LOW (ref 12.0–15.0)

## 2023-12-21 LAB — HIV ANTIBODY (ROUTINE TESTING W REFLEX): HIV Screen 4th Generation wRfx: NONREACTIVE

## 2023-12-21 LAB — CBC
HCT: 24.1 % — ABNORMAL LOW (ref 36.0–46.0)
Hemoglobin: 7.3 g/dL — ABNORMAL LOW (ref 12.0–15.0)
MCH: 25.3 pg — ABNORMAL LOW (ref 26.0–34.0)
MCHC: 30.3 g/dL (ref 30.0–36.0)
MCV: 83.4 fL (ref 80.0–100.0)
Platelets: 141 10*3/uL — ABNORMAL LOW (ref 150–400)
RBC: 2.89 MIL/uL — ABNORMAL LOW (ref 3.87–5.11)
RDW: 17.4 % — ABNORMAL HIGH (ref 11.5–15.5)
WBC: 6 10*3/uL (ref 4.0–10.5)
nRBC: 0 % (ref 0.0–0.2)

## 2023-12-21 LAB — PHOSPHORUS: Phosphorus: 4 mg/dL (ref 2.5–4.6)

## 2023-12-21 LAB — MAGNESIUM: Magnesium: 1.7 mg/dL (ref 1.7–2.4)

## 2023-12-21 LAB — CBG MONITORING, ED: Glucose-Capillary: 84 mg/dL (ref 70–99)

## 2023-12-21 MED ORDER — GABAPENTIN 400 MG PO CAPS: ORAL_CAPSULE | ORAL | 1 refills | Status: AC

## 2023-12-21 NOTE — TOC Initial Note (Signed)
 Transition of Care [facility]) - Initial/Assessment Note    Patient Details  Name: Sierra Logan MRN: [id number] Date of Birth: 07-May-1956  [facility] CM/SW Contact:    Arron Big, LCSWA Phone Number: 12/21/2023, 12:03 PM  Clinical Narrative:     Patient was previously at [facility] from 5/7 - 5/23. Patient stated she will be staying with her sister when medically ready to discharge. Will need to contact Libby Ree (sis) at  [phone].  Patient typically lives at home with spouse. Patient has reliable transportation and has a PCP, she uses Psychologist, forensic. Patient reports having the following DME: RW, bedside commode, and O2 (2L at home). Patient reports she was active with [facility] PT, OT, and RN prior to admission.   TOC will continue to follow.             Expected Discharge Plan:  (TBD) Barriers to Discharge: Continued Medical Work up   Patient Goals and CMS Choice Patient states their goals for this hospitalization and ongoing recovery are:: To go home to sisters house with home health          Expected Discharge Plan and Services In-house Referral: Clinical Social Work Discharge Planning Services: CM Consult   Living arrangements for the past 2 months: Single Family Home                                      Prior Living Arrangements/Services Living arrangements for the past 2 months: Single Family Home Lives with:: Spouse Patient language and need for interpreter reviewed:: No Do you feel safe going back to the place where you live?: Yes      Need for Family Participation in Patient Care: Yes (Comment) Care giver support system in place?: Yes (comment) Current home services: DME, Home PT, Home OT, Home RN (walker, bedside commode, O2) Criminal Activity/Legal Involvement Pertinent to Current Situation/Hospitalization: No - Comment as needed  Activities of Daily Living      Permission Sought/Granted Permission sought to share information  with : Facility Medical sales representative, Family Supports Permission granted to share information with : Yes, Verbal Permission Granted  Share Information with NAME: Libby Ree  Permission granted to share info w AGENCY: [facility]  Permission granted to share info w Relationship: sis  Permission granted to share info w Contact Information: [phone]  Emotional Assessment Appearance:: Appears stated age Attitude/Demeanor/Rapport: Engaged Affect (typically observed): Pleasant Orientation: : Oriented to Situation, Oriented to  Time, Oriented to Place, Oriented to Self Alcohol / Substance Use: Not Applicable Psych Involvement: No (comment)  Admission diagnosis:  Symptomatic anemia [D64.9] Gastrointestinal hemorrhage, unspecified gastrointestinal hemorrhage type [K92.2] Anemia, unspecified type [D64.9] Patient Active Problem List   Diagnosis Date Noted   Symptomatic anemia 12/20/2023   Pleural effusion 11/10/2023   Peripheral edema 11/10/2023   Acute blood loss anemia 11/08/2023   Abnormal chest x-ray 11/08/2023   GERD (gastroesophageal reflux disease) 11/08/2023   Obesity (BMI 30-39.9) 11/08/2023   Controlled type 2 diabetes mellitus without complication, without long-term current use of insulin (HCC) 12/07/2022   Hyperlipidemia 12/07/2022   Insomnia 12/07/2022   Routine general medical examination at a health care facility 11/10/2021   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   Secondary esophageal varices without bleeding (HCC) 12/03/2020   Aortic atherosclerosis (HCC) 11/20/2020   Tobacco abuse 11/20/2020   Concern for possible hepatopulmonary  syndrome (HCC) 10/07/2020   Chronic respiratory failure with hypoxia (HCC) 10/07/2020   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 [place] [address] Phone: [phone] Fax: [phone]     Social Drivers of Health (SDOH) Social History: SDOH Screenings   Food Insecurity: No Food Insecurity (11/08/2023)  Housing: Low Risk  (11/08/2023)  Transportation Needs: No Transportation Needs (11/08/2023)  Utilities: Not At Risk (11/08/2023)  Depression (PHQ2-9): Low Risk  (10/27/2023)  Social Connections: Moderately Isolated (11/08/2023)  Tobacco Use: High Risk (12/20/2023)   SDOH Interventions:     Readmission Risk Interventions     No data to display

## 2023-12-21 NOTE — Consult Note (Addendum)
 Consultation  Referring Provider: [facility] Primary Care Physician:  Adelia Homestead, MD Primary Gastroenterologist:  Dr.Mansouraty  Reason for Consultation: Acute anemia, weakness  HPI: Sierra Logan is a 68 y.o. 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  She has also had previous paracenteses for ascites.  History otherwise pertinent for diabetes mellitus, hyperlipidemia and hypertension tension. He had not been seen by GI for many years prior to her recent admission in April 2025 and had not been on Urso  or any other medications for her liver disease. She presented complaints of progressive lower extremity edema, shortness of breath and weakness on 11/08/2023 which had progressed over the previous few weeks.  She also related having dark stools intermittently and in the ER was found to have hemoglobin of 11.3 on admission. She had previous EGD in April 2022 with documentation of grade 2 varices, diffusely edematous gastric mucosa consistent with portal hypertensive gastropathy and also severe GAVE which was treated with RFA.  She was to return in 6 to 8 weeks for repeat procedure but had not been seen since.  She did have colonoscopy during that same setting with a poor prep but was noted to have internal hemorrhoids and one 6 mm polyp removed from the transverse colon as well as diverticulosis.  Pete EGD during her last admission in April with findings of grade 2 varices in the lower third of the esophagus, moderate GAVE which was treated with APC and mild to moderate portal gastropathy. She was started on carvedilol  PPI and had a 1 week course of Carafate . Patient ultimately did not tolerate carvedilol , was sent home on midodrine   She had also undergone diagnostic and therapeutic paracentesis during that admission, no SBP, had been restarted on Reddix with Lasix  and  Aldactone .  She says that she was to have labs done yesterday prior to upcoming appointment with her PCP and was found to have a hemoglobin of 5.2 down from 7.7 at last check. Advised to come to the emergency room. Labs here show hemoglobin 4.7/hematocrit 16.5/WBC 8.8/platelets 190. Baseline hemoglobin around 9 Sodium 131/potassium 3.8/BUN 14/creatinine 0.86 Albumin  2.7 LFTs within normal limits INR 1.3/pro time 14.1 Ferritin 13  Was transfused 2 units of packed RBCs last evening and hemoglobin 7.3 this morning.  Patient is not a great historian but denies any abdominal pain, she says she has been a little nauseated recently and not eating great but has not been vomiting.  She relates intermittent loose stools stating she normally has 2 or 3 bowel movements per day and thinks that yesterday she may have had 5 or 6 bowel movements.  She does not think that the stools were tarry or bloody yesterday. ER MD documented brown heme positive stool last p.m.  Does admit to feeling weak and fatigued, has not fallen, denies any shortness of breath or chest pain.  Hemodynamically stable since arrival in the ER   Past Medical History:  Diagnosis Date   Cirrhosis (HCC)    Diabetes mellitus without complication (HCC)    Hyperlipemia    Hypertension    Neuropathy    Spleen disease     Past Surgical History:  Procedure Laterality Date   BIOPSY  10/20/2020   Procedure: BIOPSY;  Surgeon: Normie Becton., MD;  Location: [facility];  Service: Gastroenterology;;   COLONOSCOPY WITH PROPOFOL  N/A 10/20/2020   Procedure: COLONOSCOPY WITH PROPOFOL ;  Surgeon: Yong Henle  Marieta Shorten., MD;  Location: [facility];  Service: Gastroenterology;  Laterality: N/A;   ESOPHAGOGASTRODUODENOSCOPY N/A 11/09/2023   Procedure: EGD (ESOPHAGOGASTRODUODENOSCOPY);  Surgeon: Nandigam, Kavitha V, MD;  Location: [facility] ENDOSCOPY;  Service: Gastroenterology;  Laterality: N/A;   ESOPHAGOGASTRODUODENOSCOPY (EGD) WITH  PROPOFOL  N/A 10/20/2020   Procedure: ESOPHAGOGASTRODUODENOSCOPY (EGD) WITH PROPOFOL ;  Surgeon: Brice Campi Albino Alu., MD;  Location: [facility] ENDOSCOPY;  Service: Gastroenterology;  Laterality: N/A;   HOT HEMOSTASIS N/A 11/09/2023   Procedure: EGD, WITH ARGON PLASMA COAGULATION;  Surgeon: Sergio Dandy, MD;  Location: [facility]COPY;  Service: Gastroenterology;  Laterality: N/A;   INTRAOPERATIVE TRANSTHORACIC ECHOCARDIOGRAM  10/17/2020   Procedure: TRANSTHORACIC ECHOCARDIOGRAM;  Surgeon: Elmyra Haggard, MD;  Location: [facility] ENDOSCOPY;  Service: Cardiovascular;;   IR PARACENTESIS  11/10/2023   IR THORACENTESIS ASP PLEURAL SPACE W/IMG GUIDE  11/21/2023   POLYPECTOMY  10/20/2020   Procedure: POLYPECTOMY;  Surgeon: Brice Campi Albino Alu., MD;  Location: [facility];  Service: Gastroenterology;;   RADIOFREQUENCY ABLATION  10/20/2020   Procedure: RADIO FREQUENCY ABLATION;  Surgeon: Normie Becton., MD;  Location: [facility];  Service: Gastroenterology;;   RECONSTRUCTION OF NOSE     TRACHEOSTOMY     TRACHEOSTOMY CLOSURE      Prior to Admission medications   Medication Sig Start Date End Date Taking? Authorizing Provider  acetaminophen  (TYLENOL ) 500 MG tablet Take 1 tablet (500 mg total) by mouth every 6 (six) hours as needed for mild pain (pain score 1-3) (or Fever >/= 101). 11/23/23  Yes Abbe Abate, MD  atorvastatin  (LIPITOR) 10 MG tablet Take 1 tablet (10 mg total) by mouth at bedtime. 12/20/23  Yes Adelia Homestead, MD  BIOTIN PO Take 1 tablet by mouth daily with breakfast.   Yes [provider]  Calcium  Carb-Cholecalciferol (CALCIUM +D3 PO) Take 1 tablet by mouth daily after breakfast.   Yes [provider]  furosemide  (LASIX ) 40 MG tablet Take 1 tablet (40 mg total) by mouth daily. 12/20/23  Yes Adelia Homestead, MD  ibuprofen (ADVIL) 200 MG tablet Take 200 mg by mouth every 6 (six) hours as needed for mild pain (pain score 1-3).   Yes [provider]   midodrine  (PROAMATINE ) 5 MG tablet Take 3 tablets (15 mg total) by mouth 3 (three) times daily with meals. 12/20/23  Yes Adelia Homestead, MD  Omega-3 Fatty Acids (FISH OIL PO) Take 1 capsule by mouth every evening.   Yes [provider]  pantoprazole  (PROTONIX ) 40 MG tablet Take 1 tablet (40 mg total) by mouth 2 (two) times daily. 12/20/23  Yes Adelia Homestead, MD  spironolactone  (ALDACTONE ) 100 MG tablet Take 1 tablet (100 mg total) by mouth daily. 12/20/23  Yes Adelia Homestead, MD  gabapentin  (NEURONTIN ) 400 MG capsule TAKE 1 CAPSULE EVERY MORNING AND 2 AT BEDTIME 12/21/23   Adelia Homestead, MD    Current Facility-Administered Medications  Medication Dose Route Frequency Provider Last Rate Last Admin   acetaminophen  (TYLENOL ) tablet 500 mg  500 mg Oral Q6H PRN Bary Boss, DO       cefTRIAXone  (ROCEPHIN ) 1 g in sodium chloride  0.9 % 100 mL IVPB  1 g Intravenous Once Utomwen, Adesuwa, RPH       cefTRIAXone  (ROCEPHIN ) 2 g in sodium chloride  0.9 % 100 mL IVPB  2 g Intravenous Q24H Utomwen, Adesuwa, RPH       melatonin tablet 5 mg  5 mg Oral QHS PRN Bary Boss, DO  midodrine  (PROAMATINE ) tablet 15 mg  15 mg Oral TID WC Reesa Cannon N, DO   15 mg at 12/21/23 0809   octreotide  (SANDOSTATIN ) 500 mcg in sodium chloride  0.9 % 250 mL (2 mcg/mL) infusion  50 mcg/hr Intravenous Continuous Burnette Carte, MD 25 mL/hr at 12/20/23 2011 50 mcg/hr at 12/20/23 2011   pantoprazole  (PROTONIX ) injection 40 mg  40 mg Intravenous BID Bary Boss, DO   40 mg at 12/21/23 0810   polyethylene glycol (MIRALAX  / GLYCOLAX ) packet 17 g  17 g Oral Daily PRN Bary Boss, DO       prochlorperazine (COMPAZINE) injection 5 mg  5 mg Intravenous Q6H PRN Bary Boss, DO       Current Outpatient Medications  Medication Sig Dispense Refill   acetaminophen  (TYLENOL ) 500 MG tablet Take 1 tablet (500 mg total) by mouth every 6 (six) hours as needed for mild pain (pain score 1-3) (or  Fever >/= 101).     atorvastatin  (LIPITOR) 10 MG tablet Take 1 tablet (10 mg total) by mouth at bedtime. 90 tablet 3   BIOTIN PO Take 1 tablet by mouth daily with breakfast.     Calcium  Carb-Cholecalciferol (CALCIUM +D3 PO) Take 1 tablet by mouth daily after breakfast.     furosemide  (LASIX ) 40 MG tablet Take 1 tablet (40 mg total) by mouth daily. 90 tablet 1   ibuprofen (ADVIL) 200 MG tablet Take 200 mg by mouth every 6 (six) hours as needed for mild pain (pain score 1-3).     midodrine  (PROAMATINE ) 5 MG tablet Take 3 tablets (15 mg total) by mouth 3 (three) times daily with meals. 270 tablet 3   Omega-3 Fatty Acids (FISH OIL PO) Take 1 capsule by mouth every evening.     pantoprazole  (PROTONIX ) 40 MG tablet Take 1 tablet (40 mg total) by mouth 2 (two) times daily. 60 tablet 1   spironolactone  (ALDACTONE ) 100 MG tablet Take 1 tablet (100 mg total) by mouth daily. 90 tablet 1   gabapentin  (NEURONTIN ) 400 MG capsule TAKE 1 CAPSULE EVERY MORNING AND 2 AT BEDTIME 270 capsule 1    Allergies as of 12/20/2023   (No Known Allergies)    Family History  Problem Relation Age of Onset   GER disease Mother    Heart disease Mother    Diabetes Father    Hypertension Sister    Hypertension Brother    Prostate cancer Maternal Grandfather    Hypertension Brother    Hypertension Brother    Hypertension Brother    Colon cancer Neg Hx    Esophageal cancer Neg Hx    Kidney disease Neg Hx    Liver disease Neg Hx    Pancreatic cancer Neg Hx    Rectal cancer Neg Hx    Stomach cancer Neg Hx     Social History   Socioeconomic History   Marital status: Married    Spouse name: Not on file   Number of children: 0   Years of education: Not on file   Highest education level: Not on file  Occupational History   Occupation: part time courier  Tobacco Use   Smoking status: Some Days    Current packs/day: 0.50    Average packs/day: 0.5 packs/day for 56.4 years (28.2 ttl pk-yrs)    Types: Cigarettes     Start date: 07/20/1967   Smokeless tobacco: Never   Tobacco comments:    smokes 1 per day.  Vaping Use  Vaping status: Never Used  Substance and Sexual Activity   Alcohol use: Not Currently   Drug use: Never   Sexual activity: Not on file  Other Topics Concern   Not on file  Social History Narrative   Not on file   Social Drivers of Health   Financial Resource Strain: Not on file  Food Insecurity: No Food Insecurity (11/08/2023)   Hunger Vital Sign    Worried About Running Out of Food in the Last Year: Never true    Ran Out of Food in the Last Year: Never true  Transportation Needs: No Transportation Needs (11/08/2023)   PRAPARE - Administrator, Civil Service (Medical): No    Lack of Transportation (Non-Medical): No  Physical Activity: Not on file  Stress: Not on file  Social Connections: Moderately Isolated (11/08/2023)   Social Connection and Isolation Panel [NHANES]    Frequency of Communication with Friends and Family: Once a week    Frequency of Social Gatherings with Friends and Family: Once a week    Attends Religious Services: 1 to 4 times per year    Active Member of [employer] or Organizations: No    Attends Banker Meetings: Never    Marital Status: Married  Catering manager Violence: Not At Risk (11/08/2023)   Humiliation, Afraid, Rape, and Kick questionnaire    Fear of Current or Ex-Partner: No    Emotionally Abused: No    Physically Abused: No    Sexually Abused: No    Review of Systems: Pertinent positive and negative review of systems were noted in the above HPI section.  All other review of systems was otherwise negative.   Physical Exam: Vital signs in last 24 hours: Temp:  [97.8 F (36.6 C)-98.5 F (36.9 C)] 98.1 F (36.7 C) (06/04 0802) Pulse Rate:  [62-122] 65 (06/04 0900) Resp:  [12-20] 12 (06/04 0900) BP: (86-116)/(36-68) 102/39 (06/04 0900) SpO2:  [98 %-100 %] 100 % (06/04 0900) Weight:  [71.2 kg] 71.2 kg (06/03  1625) Last BM Date : 12/20/23 General:   Alert,  Well-developed, somewhat chronically ill-appearing older white female, pleasant and cooperative in NAD-pale Head:  Normocephalic and atraumatic. Eyes:  Sclera clear, no icterus.   Conjunctiva pale Ears:  Normal auditory acuity. Nose:  No deformity, discharge,  or lesions. Mouth:  No deformity or lesions.   Neck:  Supple; no masses or thyromegaly. Lungs:  Clear throughout to auscultation.   No wheezes, crackles, or rhonchi.  Heart:  Regular rate and rhythm; systolic murmur present Abdomen:  Soft,nontender, BS active,nonpalp mass or hsm.  Nontense ascites Rectal: Not done, ER MD documented brown heme positive stool last p.m. Msk:  Symmetrical without gross deformities. . Pulses:  Normal pulses noted. Extremities: Edema bilateral lower extremities Neurologic:  Alert and  oriented x4;  grossly normal neurologically.  No asterixis Skin:  Intact without significant lesions or rashes.. Psych:  Alert and cooperative. Normal mood and affect.  Intake/Output from previous day: 06/03 0701 - 06/04 0700 In: 52.8 [I.V.:52.8] Out: -  Intake/Output this shift: No intake/output data recorded.  Lab Results: Recent Labs    12/20/23 1130 12/20/23 1627 12/21/23 0336  WBC 8.7 8.8 6.0  HGB 5.2 Repeated and verified X2.* 4.7* 7.3*  HCT 17.4 Repeated and verified X2.* 16.5* 24.1*  PLT 223.0 190 141*   BMET Recent Labs    12/20/23 1130 12/20/23 1627 12/21/23 0336  NA 134* 131* 134*  K 3.3* 3.8 3.5  CL 95*  94* 97*  CO2 32 25 27  GLUCOSE 80 111* 79  BUN 15 14 14   CREATININE 0.79 0.86 0.83  CALCIUM  8.7 8.0* 7.8*   LFT Recent Labs    12/20/23 1130 12/20/23 1627 12/21/23 0336  PROT 7.3   < > 6.5  ALBUMIN  3.4*   < > 2.5*  AST 25   < > 25  ALT 16   < > 17  ALKPHOS 133*   < > 104  BILITOT 0.8   < > 1.2  BILIDIR 0.3  --   --    < > = values in this interval not displayed.   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  Patient had outpatient labs done yesterday showing hemoglobin of 5.2 down from 7.7 at last check and with baseline of around 9.  Had a prolonged hospitalization April 2025 due to her decompensated liver disease and during that admission had undergone EGD for further evaluation of profound anemia and heme positive stool. She had treatment of GAVE with APC and documented grade 2 varices in the distal one third of the esophagus.  Suspect that she has had ongoing chronic intermittent blood loss due to GAVE and portal gastropathy  #2 profound anemia secondary to #1 #3 ascites-centesis last admission no SBP, managed with Lasix  and Aldactone  \\MELD  3.0: 15 at 12/21/2023  3:36 AM MELD-Na: 10 at 12/21/2023  3:36 AM Calculated from: Serum Creatinine: 0.83 mg/dL (Using min of 1 mg/dL) at 07/24/1094  0:45 AM Serum Sodium: 134 mmol/L at 12/21/2023  3:36 AM Total Bilirubin: 1.2 mg/dL at 4/0/9811  9:14 AM Serum Albumin : 2.5 g/dL at 01/24/2955  2:13 AM INR(ratio): 1.3 ratio at 12/20/2023 11:30 AM Age at listing (hypothetical): 67 years Sex: Female at 12/21/2023  3:36 AM   #2 diabetes mellitus #3 hyperlipidemia    PLAN: Full liquid diet today, n.p.o. after midnight Continue to trend hemoglobin every 8 hours and transfuse as indicated to keep her hemoglobin above 7 We will continue octreotide  for now and IV PPI Rocephin  has been ordered Continue midodrine  3 times daily We will plan for repeat EGD with possible repeat APC tomorrow with Dr. Yvone Herd.  Procedure was discussed in detail with the patient today including indications risks and benefits and she is agreeable to proceed  GI will follow with you   Glynda Soliday PA-C 12/21/2023, 10:04 AM

## 2023-12-21 NOTE — Plan of Care (Signed)

## 2023-12-21 NOTE — Progress Notes (Signed)
 Progress Note   Patient: Sierra Logan WJX:[id number] DOB: Sep 10, 1955 DOA: 12/20/2023     1 DOS: the patient was seen and examined on 12/21/2023   Brief hospital course: Sierra Logan is a 68 y.o. female with medical history significant for decompensated cirrhosis, esophageal varices seen on EGD 11/09/2023, history of paracentesis and thoracentesis, history of GI bleed requiring blood transfusion, who initially presented to her PCPs office today as a follow-up after discharge from SNF.  She reported tiredness, generalized fatigue and lightheadedness worse in the morning associated with loose stools.  She had CBC done in her PCPs office which returned with significant drop in her hemoglobin 5.2 from 7.7 on 11/23/2023.  Due to concern for symptomatic anemia and possible recurrent GI bleed the patient was advised to go to the ER for blood transfusion and further evaluation.  Endorses dark stools noted about a week ago.   Assessment and Plan: Symptomatic anemia Acute blood loss anemia Decompensated cirrhosis with esophageal varices seen on EGD 11/09/2023 Hemoglobin on presentation 4.7 MCV 80 Positive FOBT S/p 2 unit PRBCs transfused. Repeat Hb 7.8. Continue IV Rocephin  for SBP prophylaxis Continue Octreotide  drip and IV Protonix  twice daily, as recommended by GI. Monitor h/h closely and transfuse for Hb<7. GI evaluation appreciated- she will go for repeat EGD tomorrow. NPO past midnight, liquids for now.   Chronic hypotension in the setting of decompensated cirrhosis Complicated by acute blood loss anemia Maintain MAP greater than 65 Resumed home midodrine    Decompensated cirrhosis with esophageal varices Followed by [facility] EGD tomorrow per GI   Euvolemic hyponatremia Serum sodium 134 Encourage oral protein calorie intake as tolerated.   Generalized weakness, likely multifactorial Secondary to advanced cirrhosis and symptomatic anemia Continue to treat underlying conditions Continue  fall precautions.              Out of bed to chair. Incentive spirometry. Nursing supportive care. Fall, aspiration precautions. Diet:  Diet Orders (From admission, onward)     Start     Ordered   12/22/23 0001  Diet NPO time specified Except for: Sips with Meds  Diet effective midnight       Question:  Except for  Answer:  Sips with Meds   12/21/23 1042   12/21/23 1042  Diet full liquid Room service appropriate? Yes; Fluid consistency: Thin  Diet effective now       Question Answer Comment  Room service appropriate? Yes   Fluid consistency: Thin      12/21/23 1042           DVT prophylaxis: SCDs Start: 12/20/23 1928  Level of care: Progressive   Code Status: Full Code  Subjective: Patient is seen and examined today morning in ED. She is lying in bed, pale looking. Wishes to eat. Denies abdominal pain, nausea. Denies active bleeding.  Physical Exam: Vitals:   12/21/23 1659 12/21/23 2105 12/21/23 2106 12/21/23 2107  BP: 107/60   (!) 101/48  Pulse:  70 67 66  Resp:    18  Temp:    97.8 F (36.6 C)  TempSrc:    Oral  SpO2:  99% 100% 99%  Weight:      Height:        General - Elderly Caucasian female, no apparent distress HEENT - PERRLA, EOMI, atraumatic head, non tender sinuses. Lung - Clear, basal rales, rhonchi, wheezes. Heart - S1, S2 heard, no murmurs, rubs, trace pedal edema. Abdomen - Soft, non tender, distended, bowel sounds good  Neuro - Alert, awake and oriented x 3, non focal exam. Skin - Warm and dry.  Data Reviewed:      Latest Ref Rng & Units 12/21/2023    3:36 AM 12/20/2023    4:27 PM 12/20/2023   11:30 AM  CBC  WBC 4.0 - 10.5 K/uL 6.0  8.8  8.7   Hemoglobin 12.0 - 15.0 g/dL 7.3  4.7  5.2 Repeated and verified X2.   Hematocrit 36.0 - 46.0 % 24.1  16.5  17.4 Repeated and verified X2.   Platelets 150 - 400 K/uL 141  190  223.0       Latest Ref Rng & Units 12/21/2023    3:36 AM 12/20/2023    4:27 PM 12/20/2023   11:30 AM  BMP  Glucose 70 - 99  mg/dL 79  409  80   BUN 8 - 23 mg/dL 14  14  15    Creatinine 0.44 - 1.00 mg/dL 8.11  9.14  7.82   Sodium 135 - 145 mmol/L 134  131  134   Potassium 3.5 - 5.1 mmol/L 3.5  3.8  3.3   Chloride 98 - 111 mmol/L 97  94  95   CO2 22 - 32 mmol/L 27  25  32   Calcium  8.9 - 10.3 mg/dL 7.8  8.0  8.7    DG Chest Port 1 View Result Date: 12/20/2023 CLINICAL DATA:  Weakness EXAM: PORTABLE CHEST 1 VIEW COMPARISON:  Chest x-ray 11/21/2023 FINDINGS: The heart size and mediastinal contours are within normal limits. Both lungs are clear. The visualized skeletal structures are unremarkable. IMPRESSION: No active disease. Electronically Signed   By: Sierra Logan M.D.   On: 12/20/2023 19:20    Family Communication: Discussed with patient, she understand and agree. All questions answered.  Disposition: Status is: Inpatient Remains inpatient appropriate because: Hb monitoring, GI work up  Planned Discharge Destination: Home     Time spent: 40 minutes  Author: Aisha Hove, MD 12/21/2023 12:08 PM Secure chat 7am to 7pm For on call review [email or web address].

## 2023-12-22 ENCOUNTER — Inpatient Hospital Stay (HOSPITAL_COMMUNITY)

## 2023-12-22 ENCOUNTER — Encounter: Payer: Self-pay | Admitting: Internal Medicine

## 2023-12-22 ENCOUNTER — Encounter (HOSPITAL_COMMUNITY)
Admission: EM | Disposition: A | Discharge status: Home / Self Care | Payer: Self-pay | Source: Home / Self Care | Attending: Internal Medicine

## 2023-12-22 ENCOUNTER — Encounter (HOSPITAL_COMMUNITY): Payer: Self-pay | Admitting: Internal Medicine

## 2023-12-22 DIAGNOSIS — I10 Essential (primary) hypertension: Secondary | ICD-10-CM

## 2023-12-22 DIAGNOSIS — D649 Anemia, unspecified: Secondary | ICD-10-CM | POA: Diagnosis not present

## 2023-12-22 DIAGNOSIS — K766 Portal hypertension: Secondary | ICD-10-CM | POA: Diagnosis not present

## 2023-12-22 DIAGNOSIS — I8511 Secondary esophageal varices with bleeding: Secondary | ICD-10-CM | POA: Diagnosis not present

## 2023-12-22 DIAGNOSIS — D500 Iron deficiency anemia secondary to blood loss (chronic): Secondary | ICD-10-CM

## 2023-12-22 DIAGNOSIS — K31819 Angiodysplasia of stomach and duodenum without bleeding: Secondary | ICD-10-CM

## 2023-12-22 DIAGNOSIS — K3189 Other diseases of stomach and duodenum: Secondary | ICD-10-CM | POA: Diagnosis not present

## 2023-12-22 DIAGNOSIS — K7290 Hepatic failure, unspecified without coma: Secondary | ICD-10-CM | POA: Diagnosis not present

## 2023-12-22 DIAGNOSIS — F17210 Nicotine dependence, cigarettes, uncomplicated: Secondary | ICD-10-CM | POA: Diagnosis not present

## 2023-12-22 DIAGNOSIS — I8500 Esophageal varices without bleeding: Secondary | ICD-10-CM | POA: Diagnosis not present

## 2023-12-22 DIAGNOSIS — D62 Acute posthemorrhagic anemia: Secondary | ICD-10-CM | POA: Diagnosis not present

## 2023-12-22 HISTORY — PX: ESOPHAGOGASTRODUODENOSCOPY: SHX5428

## 2023-12-22 HISTORY — PX: HOT HEMOSTASIS: SHX5433

## 2023-12-22 LAB — CBC WITH DIFFERENTIAL/PLATELET
Abs Immature Granulocytes: 0.03 10*3/uL (ref 0.00–0.07)
Basophils Absolute: 0.1 10*3/uL (ref 0.0–0.1)
Basophils Relative: 1 %
Eosinophils Absolute: 0.2 10*3/uL (ref 0.0–0.5)
Eosinophils Relative: 4 %
HCT: 24.4 % — ABNORMAL LOW (ref 36.0–46.0)
Hemoglobin: 7.3 g/dL — ABNORMAL LOW (ref 12.0–15.0)
Immature Granulocytes: 1 %
Lymphocytes Relative: 18 %
Lymphs Abs: 1.1 10*3/uL (ref 0.7–4.0)
MCH: 25 pg — ABNORMAL LOW (ref 26.0–34.0)
MCHC: 29.9 g/dL — ABNORMAL LOW (ref 30.0–36.0)
MCV: 83.6 fL (ref 80.0–100.0)
Monocytes Absolute: 0.6 10*3/uL (ref 0.1–1.0)
Monocytes Relative: 10 %
Neutro Abs: 4.1 10*3/uL (ref 1.7–7.7)
Neutrophils Relative %: 66 %
Platelets: 145 10*3/uL — ABNORMAL LOW (ref 150–400)
RBC: 2.92 MIL/uL — ABNORMAL LOW (ref 3.87–5.11)
RDW: 17.8 % — ABNORMAL HIGH (ref 11.5–15.5)
WBC: 6 10*3/uL (ref 4.0–10.5)
nRBC: 0 % (ref 0.0–0.2)

## 2023-12-22 SURGERY — EGD (ESOPHAGOGASTRODUODENOSCOPY)
Anesthesia: Monitor Anesthesia Care

## 2023-12-22 MED ORDER — SODIUM CHLORIDE 0.9 % IV SOLN
INTRAVENOUS | Status: DC
  Administered 2023-12-22: 250 mL via INTRAVENOUS

## 2023-12-22 MED ORDER — PROPOFOL 10 MG/ML IV BOLUS
INTRAVENOUS | Status: DC | PRN
  Administered 2023-12-22: 40 mg via INTRAVENOUS
  Administered 2023-12-22: 30 mg via INTRAVENOUS
  Administered 2023-12-22 (×2): 20 mg via INTRAVENOUS
  Administered 2023-12-22 (×4): 30 mg via INTRAVENOUS

## 2023-12-22 MED ORDER — PHENYLEPHRINE HCL (PRESSORS) 10 MG/ML IV SOLN
INTRAVENOUS | Status: DC | PRN
  Administered 2023-12-22 (×2): 80 ug via INTRAVENOUS

## 2023-12-22 NOTE — Transfer of Care (Signed)
 Immediate Anesthesia Transfer of Care Note  Patient: Sierra Logan  Procedure(s) Performed: EGD (ESOPHAGOGASTRODUODENOSCOPY) EGD, WITH ARGON PLASMA COAGULATION  Patient Location: PACU  Anesthesia Type:MAC  Level of Consciousness: awake, alert , and oriented  Airway & Oxygen Therapy: Patient Spontanous Breathing and Patient connected to nasal cannula oxygen  Post-op Assessment: Report given to RN and Post -op Vital signs reviewed and stable  Post vital signs: Reviewed and stable  Last Vitals:  Vitals Value Taken Time  BP    Temp    Pulse    Resp    SpO2      Last Pain:  Vitals:   12/22/23 0835  TempSrc: Temporal  PainSc: 0-No pain      Patients Stated Pain Goal: 0 (12/21/23 2107)  Complications: No notable events documented.

## 2023-12-22 NOTE — Progress Notes (Signed)
 Progress Note   Patient: Sierra Logan:[id number] DOB: [birth year] DOA: 12/20/2023     2 DOS: the patient was seen and examined on 12/22/2023   Brief hospital course: ALONNI HEIMSOTH is a 68 y.o. female with medical history significant for decompensated cirrhosis, esophageal varices seen on EGD 11/09/2023, history of paracentesis and thoracentesis, history of GI bleed requiring blood transfusion, who initially presented to her PCPs office today as a follow-up after discharge from SNF.  She reported tiredness, generalized fatigue and lightheadedness worse in the morning associated with loose stools.  She had CBC done in her PCPs office which returned with significant drop in her hemoglobin 5.2 from 7.7 on 11/23/2023.  Due to concern for symptomatic anemia and possible recurrent GI bleed the patient was advised to go to the ER for blood transfusion and further evaluation.  Endorses dark stools noted about a week ago.   Assessment and Plan: Symptomatic anemia Acute blood loss anemia Decompensated cirrhosis with esophageal varices seen on EGD 11/09/2023 Hemoglobin on presentation 4.7 MCV 80 Positive FOBT S/p 2 unit PRBCs transfused. Repeat Hb 7.3 today. S/p EGD - showed grade II esophageal varices. Portal hypertensive gastropathy, GAVE, portal hypertensive findings. Continue IV Rocephin  for SBP prophylaxis Stopped Octreotide  drip  Continue IV Protonix  twice daily, as recommended by GI. Monitor h/h closely and transfuse for Hb<7. Able to tolerate diet.   Chronic hypotension in the setting of decompensated cirrhosis Complicated by acute blood loss anemia Resumed home midodrine . BP better.   Euvolemic hyponatremia Serum sodium 134 Encourage oral protein calorie intake as tolerated.   Generalized weakness, likely multifactorial Secondary to advanced cirrhosis and symptomatic anemia Continue to treat underlying conditions Continue fall precautions.            Out of bed to chair. Incentive  spirometry. Nursing supportive care. Fall, aspiration precautions. Diet:  Diet Orders (From admission, onward)     Start     Ordered   12/22/23 1113  Diet Heart Fluid consistency: Thin  Diet effective now       Question:  Fluid consistency:  Answer:  Thin   12/22/23 1112           DVT prophylaxis: SCDs Start: 12/20/23 1928  Level of care: Progressive   Code Status: Full Code  Subjective: Patient is seen and examined today morning after EGD. She is eating her lunch. Denies abdominal pain, nausea or active bleeding.  Physical Exam: Vitals:   12/22/23 1012 12/22/23 1024 12/22/23 1030 12/22/23 1624  BP: 101/61  (!) 111/58 (!) 127/58  Pulse: 85 91 75 77  Resp: 15 20 16 20   Temp: 97.6 F (36.4 C)     TempSrc: Temporal     SpO2: 100% 92% 92% 97%  Weight:      Height:        General - Elderly Caucasian female, no apparent distress HEENT - PERRLA, EOMI, atraumatic head, non tender sinuses. Lung - Clear, basal rales, rhonchi, wheezes. Heart - S1, S2 heard, no murmurs, rubs, trace pedal edema. Abdomen - Soft, non tender, distended, bowel sounds good Neuro - Alert, awake and oriented x 3, non focal exam. Skin - Warm and dry.  Data Reviewed:      Latest Ref Rng & Units 12/22/2023    2:52 AM 12/21/2023    3:07 PM 12/21/2023    3:36 AM  CBC  WBC 4.0 - 10.5 K/uL 6.0   6.0   Hemoglobin 12.0 - 15.0 g/dL 7.3  7.8  7.3   Hematocrit 36.0 - 46.0 % 24.4  24.9  24.1   Platelets 150 - 400 K/uL 145   141       Latest Ref Rng & Units 12/21/2023    3:36 AM 12/20/2023    4:27 PM 12/20/2023   11:30 AM  BMP  Glucose 70 - 99 mg/dL 79  161  80   BUN 8 - 23 mg/dL 14  14  15    Creatinine 0.44 - 1.00 mg/dL 0.96  0.45  4.09   Sodium 135 - 145 mmol/L 134  131  134   Potassium 3.5 - 5.1 mmol/L 3.5  3.8  3.3   Chloride 98 - 111 mmol/L 97  94  95   CO2 22 - 32 mmol/L 27  25  32   Calcium  8.9 - 10.3 mg/dL 7.8  8.0  8.7    DG Chest Port 1 View Result Date: 12/20/2023 CLINICAL DATA:  Weakness EXAM:  PORTABLE CHEST 1 VIEW COMPARISON:  Chest x-ray 11/21/2023 FINDINGS: The heart size and mediastinal contours are within normal limits. Both lungs are clear. The visualized skeletal structures are unremarkable. IMPRESSION: No active disease. Electronically Signed   By: Tyron Gallon M.D.   On: 12/20/2023 19:20    Family Communication: Discussed with patient, she understand and agree. All questions answered.  Disposition: Status is: Inpatient Remains inpatient appropriate because: Hb monitoring, GI work up  Planned Discharge Destination: Home     Time spent: 41 minutes  Author: Aisha Hove, MD 12/22/2023 6:01 PM Secure chat 7am to 7pm For on call review [email or web address].

## 2023-12-22 NOTE — Anesthesia Preprocedure Evaluation (Signed)
 Anesthesia Evaluation  Patient identified by MRN, date of birth, ID band Patient awake    Reviewed: Allergy & Precautions, NPO status , Patient's Chart, lab work & pertinent test results  History of Anesthesia Complications Negative for: history of anesthetic complications  Airway Mallampati: III  TM Distance: >3 FB Neck ROM: Full    Dental  (+) Dental Advisory Given, Teeth Intact   Pulmonary shortness of breath and Long-Term Oxygen Therapy, Current Smoker   breath sounds clear to auscultation       Cardiovascular hypertension, (-) angina (-) Past MI  Rhythm:Regular  1. Left ventricular ejection fraction, by estimation, is 60 to 65%. The  left ventricle has normal function. The left ventricle has no regional  wall motion abnormalities. Left ventricular diastolic parameters were  normal.   2. Right ventricular systolic function is normal. The right ventricular  size is normal. Tricuspid regurgitation signal is inadequate for assessing  PA pressure.   3. A small pericardial effusion is present. The pericardial effusion is  circumferential. Large pleural effusion in the left lateral region.   4. The mitral valve is degenerative. No evidence of mitral valve  regurgitation. No evidence of mitral stenosis. Severe mitral annular  calcification.   5. The aortic valve has an indeterminant number of cusps. There is  moderate calcification of the aortic valve. There is moderate thickening  of the aortic valve. Aortic valve regurgitation is mild. Aortic valve  sclerosis/calcification is present, without   any evidence of aortic stenosis. Aortic valve area, by VTI measures 2.13  cm. Aortic valve mean gradient measures 4.0 mmHg. Aortic valve Vmax  measures 1.42 m/s.   6. The inferior vena cava is dilated in size with >50% respiratory  variability, suggesting right atrial pressure of 8 mmHg.     Neuro/Psych  Neuromuscular disease     GI/Hepatic ,GERD  ,,(+) Cirrhosis       Lab Results      Component                Value               Date                      ALT                      17                  12/21/2023                AST                      25                  12/21/2023                ALKPHOS                  104                 12/21/2023                BILITOT                  1.2                 12/21/2023  Endo/Other  diabetes    Renal/GU      Musculoskeletal negative musculoskeletal ROS (+)    Abdominal   Peds  Hematology  (+) Blood dyscrasia, anemia Lab Results      Component                Value               Date                      WBC                      6.0                 12/22/2023                HGB                      7.3 (L)             12/22/2023                HCT                      24.4 (L)            12/22/2023                MCV                      83.6                12/22/2023                PLT                      145 (L)             12/22/2023              Anesthesia Other Findings   Reproductive/Obstetrics                              Anesthesia Physical Anesthesia Plan  ASA: 3  Anesthesia Plan: MAC   Post-op Pain Management: Minimal or no pain anticipated   Induction: Intravenous  PONV Risk Score and Plan: 1 and Propofol  infusion and Treatment may vary due to age or medical condition  Airway Management Planned: Nasal Cannula, Natural Airway and Simple Face Mask  Additional Equipment: None  Intra-op Plan:   Post-operative Plan:   Informed Consent: I have reviewed the patients History and Physical, chart, labs and discussed the procedure including the risks, benefits and alternatives for the proposed anesthesia with the patient or authorized representative who has indicated his/her understanding and acceptance.     Dental advisory given  Plan Discussed with: CRNA  Anesthesia Plan Comments:           Anesthesia Quick Evaluation

## 2023-12-22 NOTE — Op Note (Signed)
 [facility] Patient Name: Sierra Logan Procedure Date : 12/22/2023 MRN: [id number] Attending MD: Eugenia Hess , MD, [id number] Date of Birth: [birth year] CSN: [id number] Age: 68 Admit Type: Inpatient Procedure:                Upper GI endoscopy Indications:              Iron deficiency anemia secondary to chronic blood                            loss, cirrhosis, history of esophageal varices,                            portal hypertensive gastropathy and GAVE Providers:                Eugenia Hess, MD, Bradley Caffey, Gabino Joe, Technician Referring MD:              Medicines:                Monitored Anesthesia Care Complications:            No immediate complications. Estimated blood loss:                            Minimal. Estimated Blood Loss:     Estimated blood loss was minimal. Procedure:                Pre-Anesthesia Assessment:                           - Prior to the procedure, a History and Physical                            was performed, and patient medications and                            allergies were reviewed. The patient's tolerance of                            previous anesthesia was also reviewed. The risks                            and benefits of the procedure and the sedation                            options and risks were discussed with the patient.                            All questions were answered, and informed consent                            was obtained. Prior Anticoagulants: The patient has                            taken  no anticoagulant or antiplatelet agents. ASA                            Grade Assessment: III - A patient with severe                            systemic disease. After reviewing the risks and                            benefits, the patient was deemed in satisfactory                            condition to undergo the procedure.                           After obtaining  informed consent, the endoscope was                            passed under direct vision. Throughout the                            procedure, the patient's blood pressure, pulse, and                            oxygen saturations were monitored continuously. The                            GIF-H190 ([id number]) Olympus endoscope was introduced                            through the mouth, and advanced to the second part                            of duodenum. The upper GI endoscopy was                            accomplished without difficulty. The patient                            tolerated the procedure well. Scope In: Scope Out: Findings:      The upper third of the esophagus was normal.      Grade II varices were found in the middle third of the esophagus and in       the lower third of the esophagus.      Mild portal hypertensive gastropathy was found in the entire examined       stomach.      The cardia (on retroflexion) and gastric fundus (on retroflexion) were       normal. No gastric varices.      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 Coagulation for tissue destruction       using argon plasma at 1 liter/minute and 20 watts was successful.      Moderate portal hypertensive duodenopathy was seen in the 1st and  2nd       portion of the duodenum with nodular and erythematous mucosa      There was no active bleeding or stigmata of recent bleeding identified       during today's exam Impression:               - Normal upper third of esophagus.                           - Grade II esophageal varices.                           - Portal hypertensive gastropathy.                           - Normal cardia and gastric fundus.                           - Gastric antral vascular ectasia without bleeding.                            Treated with argon plasma coagulation (APC).                           - Source of  patient's recurrent anemia and                            suspected blood loss may be due to GAVE as well as                            portal hypertensive duodenopathy. Recommendation:           - Return patient to hospital ward for ongoing care.                           - Monitor serial H&H and hemodynamics                           - Discontinue octreotide  in absence of ongoing                            active bleeding                           - Continue IV PPI                           - Continue ceftriaxone                            - Patient will be scheduled with Dr. Brice Campi                            after hospital discharge to review further                            management of GAVE with RFA. Procedure Code(s):        ---  Professional ---                           [phone], Esophagogastroduodenoscopy, flexible,                            transoral; with ablation of tumor(s), polyp(s), or                            other lesion(s) (includes pre- and post-dilation                            and guide wire passage, when performed) Diagnosis Code(s):        --- Professional ---                           I85.00, Esophageal varices without bleeding                           K76.6, Portal hypertension                           K31.89, Other diseases of stomach and duodenum                           K31.819, Angiodysplasia of stomach and duodenum                            without bleeding                           D50.0, Iron deficiency anemia secondary to blood                            loss (chronic) CPT copyright 2022 American Medical Association. All rights reserved. The codes documented in this report are preliminary and upon coder review may  be revised to meet current compliance requirements. Eugenia Hess, MD 12/22/2023 10:23:59 AM This report has been signed electronically. Number of Addenda: 0

## 2023-12-22 NOTE — Plan of Care (Signed)

## 2023-12-22 NOTE — H&P (Signed)
 [facility] History and Physical   Primary Care Physician:  Adelia Homestead, MD   Reason for Procedure:  Anemia, cirrhosis, history of esophageal varices, GAVE, portal hypertensive gastropathy  Plan:    Upper endoscopy     HPI: Sierra Logan is a 68 y.o. female undergoing upper endoscopy for investigation of anemia in the setting of cirrhosis.  Patient has known history of esophageal varices, GAVE and portal hypertensive gastropathy.  Recent EGD in April 2025 showed significant GAVE that was treated with APC.  Patient has received RFA for this in the past as well.  Suspicion is that patient has had recurrent bleeding from GAVE and procedures performed today for assessment and therapeutics.   Past Medical History:  Diagnosis Date   Cirrhosis (HCC)    Diabetes mellitus without complication (HCC)    Hyperlipemia    Hypertension    Neuropathy    Spleen disease     Past Surgical History:  Procedure Laterality Date   BIOPSY  10/20/2020   Procedure: BIOPSY;  Surgeon: Brice Campi Albino Alu., MD;  Location: [facility];  Service: Gastroenterology;;   COLONOSCOPY WITH PROPOFOL  N/A 10/20/2020   Procedure: COLONOSCOPY WITH PROPOFOL ;  Surgeon: Normie Becton., MD;  Location: [facility];  Service: Gastroenterology;  Laterality: N/A;   ESOPHAGOGASTRODUODENOSCOPY N/A 11/09/2023   Procedure: EGD (ESOPHAGOGASTRODUODENOSCOPY);  Surgeon: Nandigam, Kavitha V, MD;  Location: [facility];  Service: Gastroenterology;  Laterality: N/A;   ESOPHAGOGASTRODUODENOSCOPY (EGD) WITH PROPOFOL  N/A 10/20/2020   Procedure: ESOPHAGOGASTRODUODENOSCOPY (EGD) WITH PROPOFOL ;  Surgeon: Brice Campi Albino Alu., MD;  Location: [facility];  Service: Gastroenterology;  Laterality: N/A;   HOT HEMOSTASIS N/A 11/09/2023   Procedure: EGD, WITH ARGON PLASMA COAGULATION;  Surgeon: Sergio Dandy, MD;  Location: [facility];  Service: Gastroenterology;  Laterality: N/A;   INTRAOPERATIVE  TRANSTHORACIC ECHOCARDIOGRAM  10/17/2020   Procedure: TRANSTHORACIC ECHOCARDIOGRAM;  Surgeon: Elmyra Haggard, MD;  Location: [facility] END[facility];  Service: Cardiovascular;;   IR PARACENTESIS  11/10/2023   IR THORACENTESIS ASP PLEURAL SPACE W/IMG GUIDE  11/21/2023   POLYPECTOMY  10/20/2020   Procedure: POLYPECTOMY;  Surgeon: Brice Campi Albino Alu., MD;  Location: [facility];  Service: Gastroenterology;;   RADIOFREQUENCY ABLATION  10/20/2020   Procedure: RADIO FREQUENCY ABLATION;  Surgeon: Normie Becton., MD;  Location: [facility];  Service: Gastroenterology;;   RECONSTRUCTION OF NOSE     TRACHEOSTOMY     TRACHEOSTOMY CLOSURE      Prior to Admission medications   Medication Sig Start Date End Date Taking? Authorizing Provider  acetaminophen  (TYLENOL ) 500 MG tablet Take 1 tablet (500 mg total) by mouth every 6 (six) hours as needed for mild pain (pain score 1-3) (or Fever >/= 101). 11/23/23  Yes Abbe Abate, MD  atorvastatin  (LIPITOR) 10 MG tablet Take 1 tablet (10 mg total) by mouth at bedtime. 12/20/23  Yes Adelia Homestead, MD  BIOTIN PO Take 1 tablet by mouth daily with breakfast.   Yes [provider]  Calcium  Carb-Cholecalciferol (CALCIUM +D3 PO) Take 1 tablet by mouth daily after breakfast.   Yes [provider]  furosemide  (LASIX ) 40 MG tablet Take 1 tablet (40 mg total) by mouth daily. 12/20/23  Yes Adelia Homestead, MD  ibuprofen (ADVIL) 200 MG tablet Take 200 mg by mouth every 6 (six) hours as needed for mild pain (pain score 1-3).   Yes [provider]  midodrine  (PROAMATINE ) 5 MG tablet Take 3 tablets (15 mg total) by mouth 3 (three) times daily with meals.  12/20/23  Yes Adelia Homestead, MD  Omega-3 Fatty Acids (FISH OIL PO) Take 1 capsule by mouth every evening.   Yes [provider]  pantoprazole  (PROTONIX ) 40 MG tablet Take 1 tablet (40 mg total) by mouth 2 (two) times daily. 12/20/23  Yes Adelia Homestead, MD   spironolactone  (ALDACTONE ) 100 MG tablet Take 1 tablet (100 mg total) by mouth daily. 12/20/23  Yes Adelia Homestead, MD  gabapentin  (NEURONTIN ) 400 MG capsule TAKE 1 CAPSULE EVERY MORNING AND 2 AT BEDTIME 12/21/23   Adelia Homestead, MD    Current Facility-Administered Medications  Medication Dose Route Frequency Provider Last Rate Last Admin   0.9 %  sodium chloride  infusion   Intravenous Continuous Truddie Furrow, MD 10 mL/hr at 12/22/23 0837 250 mL at 12/22/23 0837   [MAR Hold] acetaminophen  (TYLENOL ) tablet 500 mg  500 mg Oral Q6H PRN Bary Boss, DO       [MAR Hold] cefTRIAXone  (ROCEPHIN ) 1 g in sodium chloride  0.9 % 100 mL IVPB  1 g Intravenous Once Utomwen, Adesuwa, RPH       [MAR Hold] cefTRIAXone  (ROCEPHIN ) 2 g in sodium chloride  0.9 % 100 mL IVPB  2 g Intravenous Q24H Utomwen, Adesuwa, RPH 200 mL/hr at 12/21/23 2118 2 g at 12/21/23 2118   [MAR Hold] melatonin tablet 5 mg  5 mg Oral QHS PRN Reesa Cannon N, DO   5 mg at 12/22/23 0014   [MAR Hold] midodrine  (PROAMATINE ) tablet 15 mg  15 mg Oral TID WC Reesa Cannon N, DO   15 mg at 12/21/23 1635   octreotide  (SANDOSTATIN ) 500 mcg in sodium chloride  0.9 % 250 mL (2 mcg/mL) infusion  50 mcg/hr Intravenous Continuous Burnette Carte, MD 25 mL/hr at 12/22/23 0252 50 mcg/hr at 12/22/23 0252   [MAR Hold] pantoprazole  (PROTONIX ) injection 40 mg  40 mg Intravenous BID Reesa Cannon N, DO   40 mg at 12/21/23 2106   [facility] Hold] polyethylene glycol (MIRALAX  / GLYCOLAX ) packet 17 g  17 g Oral Daily PRN Bary Boss, DO       [MAR Hold] prochlorperazine (COMPAZINE) injection 5 mg  5 mg Intravenous Q6H PRN Reesa Cannon N, DO        Allergies as of 12/20/2023   (No Known Allergies)    Family History  Problem Relation Age of Onset   GER disease Mother    Heart disease Mother    Diabetes Father    Hypertension Sister    Hypertension Brother    Prostate cancer Maternal Grandfather    Hypertension Brother    Hypertension Brother     Hypertension Brother    Colon cancer Neg Hx    Esophageal cancer Neg Hx    Kidney disease Neg Hx    Liver disease Neg Hx    Pancreatic cancer Neg Hx    Rectal cancer Neg Hx    Stomach cancer Neg Hx     Social History   Socioeconomic History   Marital status: Married    Spouse name: Not on file   Number of children: 0   Years of education: Not on file   Highest education level: Not on file  Occupational History   Occupation: part time courier  Tobacco Use   Smoking status: Some Days    Current packs/day: 0.50    Average packs/day: 0.5 packs/day for 56.4 years (28.2 ttl pk-yrs)    Types: Cigarettes    Start date: 07/20/1967  Smokeless tobacco: Never   Tobacco comments:    smokes 1 per day.  Vaping Use   Vaping status: Never Used  Substance and Sexual Activity   Alcohol use: Not Currently   Drug use: Never   Sexual activity: Not on file  Other Topics Concern   Not on file  Social History Narrative   Not on file   Social Drivers of Health   Financial Resource Strain: Not on file  Food Insecurity: No Food Insecurity (12/21/2023)   Hunger Vital Sign    Worried About Running Out of Food in the Last Year: Never true    Ran Out of Food in the Last Year: Never true  Transportation Needs: No Transportation Needs (12/21/2023)   PRAPARE - Administrator, Civil Service (Medical): No    Lack of Transportation (Non-Medical): No  Physical Activity: Not on file  Stress: Not on file  Social Connections: Moderately Isolated (12/21/2023)   Social Connection and Isolation Panel [NHANES]    Frequency of Communication with Friends and Family: Once a week    Frequency of Social Gatherings with Friends and Family: Once a week    Attends Religious Services: 1 to 4 times per year    Active Member of [employer] or Organizations: No    Attends Banker Meetings: Never    Marital Status: Married  Catering manager Violence: Not At Risk (12/21/2023)   Humiliation, Afraid, Rape,  and Kick questionnaire    Fear of Current or Ex-Partner: No    Emotionally Abused: No    Physically Abused: No    Sexually Abused: No    Review of Systems:  All other review of systems negative except as mentioned in the HPI.  Physical Exam: Vital signs BP (!) 113/46   Pulse 72   Temp 97.7 F (36.5 C) (Temporal)   Resp (!) 23   Ht 5\' 2"  (1.575 m)   Wt 72 kg   SpO2 100%   BMI 29.03 kg/m   General:   Elderly woman in NAD Lungs:  Clear throughout to auscultation.   Heart:  Regular rate and rhythm; no murmurs, clicks, rubs,  or gallops. Abdomen:  Soft, nontender and nondistended. Normal bowel sounds.   Neuro/Psych:  Normal mood and affect. A and O x 3  Eugenia Hess, MD [facility]

## 2023-12-23 ENCOUNTER — Other Ambulatory Visit (HOSPITAL_COMMUNITY): Payer: Self-pay

## 2023-12-23 ENCOUNTER — Telehealth: Payer: Self-pay | Admitting: *Deleted

## 2023-12-23 ENCOUNTER — Encounter: Payer: Self-pay | Admitting: Gastroenterology

## 2023-12-23 DIAGNOSIS — D62 Acute posthemorrhagic anemia: Secondary | ICD-10-CM

## 2023-12-23 DIAGNOSIS — K31819 Angiodysplasia of stomach and duodenum without bleeding: Secondary | ICD-10-CM | POA: Diagnosis not present

## 2023-12-23 DIAGNOSIS — E119 Type 2 diabetes mellitus without complications: Secondary | ICD-10-CM | POA: Diagnosis not present

## 2023-12-23 DIAGNOSIS — Z720 Tobacco use: Secondary | ICD-10-CM

## 2023-12-23 DIAGNOSIS — D649 Anemia, unspecified: Secondary | ICD-10-CM | POA: Diagnosis not present

## 2023-12-23 DIAGNOSIS — I8510 Secondary esophageal varices without bleeding: Secondary | ICD-10-CM

## 2023-12-23 DIAGNOSIS — J9611 Chronic respiratory failure with hypoxia: Secondary | ICD-10-CM | POA: Diagnosis not present

## 2023-12-23 DIAGNOSIS — K2970 Gastritis, unspecified, without bleeding: Secondary | ICD-10-CM

## 2023-12-23 LAB — BASIC METABOLIC PANEL WITH GFR
Anion gap: 9 (ref 5–15)
BUN: 8 mg/dL (ref 8–23)
CO2: 24 mmol/L (ref 22–32)
Calcium: 8.5 mg/dL — ABNORMAL LOW (ref 8.9–10.3)
Chloride: 101 mmol/L (ref 98–111)
Creatinine, Ser: 0.72 mg/dL (ref 0.44–1.00)
GFR, Estimated: >60 mL/min (ref >60)
Glucose, Bld: 143 mg/dL — ABNORMAL HIGH (ref 70–99)
Potassium: 3.9 mmol/L (ref 3.5–5.1)
Sodium: 134 mmol/L — ABNORMAL LOW (ref 135–145)

## 2023-12-23 LAB — HEMOGLOBIN AND HEMATOCRIT, BLOOD
HCT: 26 % — ABNORMAL LOW (ref 36.0–46.0)
Hemoglobin: 7.7 g/dL — ABNORMAL LOW (ref 12.0–15.0)

## 2023-12-23 MED ORDER — SUCRALFATE 1 GM/10ML PO SUSP
1.0000 g | Freq: Three times a day (TID) | ORAL | 0 refills | Status: DC
  Filled 2023-12-23: qty 414, 14d supply, fill #0

## 2023-12-23 MED ORDER — SUCRALFATE 1 GM/10ML PO SUSP
1.0000 g | Freq: Three times a day (TID) | ORAL | Status: DC
  Administered 2023-12-23: 1 g via ORAL
  Filled 2023-12-23: qty 10

## 2023-12-23 MED ORDER — PANTOPRAZOLE SODIUM 40 MG PO TBEC
40.0000 mg | DELAYED_RELEASE_TABLET | Freq: Two times a day (BID) | ORAL | 1 refills | Status: DC
  Filled 2023-12-23: qty 180, 90d supply, fill #0

## 2023-12-23 MED ORDER — PROPRANOLOL HCL 10 MG PO TABS
10.0000 mg | ORAL_TABLET | Freq: Three times a day (TID) | ORAL | 1 refills | Status: DC
  Filled 2023-12-23: qty 90, 30d supply, fill #0

## 2023-12-23 NOTE — Telephone Encounter (Signed)
-----   Message from Ace Holder sent at 12/23/2023  9:20 AM EDT ----- Regarding: [facility] patient to be discharged This is a patient of [facility] to be discharged today. Needs outpatient follow up with him or APP in the next month or so Needs a CBC drawn early next week - Monday or Tuesday if someone can order.   Thanks

## 2023-12-23 NOTE — Progress Notes (Signed)
 Progress Note   Subjective  Patient states she feels well and wants to go home. Tolerating a diet, no bleeding symptoms. Labs for this AM remain pending. She is s/p EGD yesterday with APC treatment of GAVE, states she tolerated it well.    Objective   Vital signs in last 24 hours: Temp:  [97.6 F (36.4 C)-97.9 F (36.6 C)] 97.8 F (36.6 C) (06/06 0756) Pulse Rate:  [75-91] 90 (06/06 0756) Resp:  [15-20] 20 (06/06 0756) BP: (101-127)/(32-61) 120/40 (06/06 0756) SpO2:  [92 %-100 %] 94 % (06/06 0756) Weight:  [72.8 kg] 72.8 kg (06/06 0521) Last BM Date : 12/22/23 General:    white female in NAD Neurologic:  Alert and oriented,  grossly normal neurologically. Psych:  Cooperative. Normal mood and affect.  Intake/Output from previous day: 06/05 0701 - 06/06 0700 In: 1412.8 [P.O.:240; I.V.:972.4; IV Piggyback:200.4] Out: 200 [Urine:200] Intake/Output this shift: Total I/O In: 120 [P.O.:120] Out: -   Lab Results: Recent Labs    12/20/23 1627 12/21/23 0336 12/21/23 1507 12/22/23 0252  WBC 8.8 6.0  --  6.0  HGB 4.7* 7.3* 7.8* 7.3*  HCT 16.5* 24.1* 24.9* 24.4*  PLT 190 141*  --  145*   BMET Recent Labs    12/20/23 1130 12/20/23 1627 12/21/23 0336  NA 134* 131* 134*  K 3.3* 3.8 3.5  CL 95* 94* 97*  CO2 32 25 27  GLUCOSE 80 111* 79  BUN 15 14 14   CREATININE 0.79 0.86 0.83  CALCIUM  8.7 8.0* 7.8*   LFT Recent Labs    12/20/23 1130 12/20/23 1627 12/21/23 0336  PROT 7.3   < > 6.5  ALBUMIN  3.4*   < > 2.5*  AST 25   < > 25  ALT 16   < > 17  ALKPHOS 133*   < > 104  BILITOT 0.8   < > 1.2  BILIDIR 0.3  --   --    < > = values in this interval not displayed.   PT/INR Recent Labs    12/20/23 1130  LABPROT 14.1*  INR 1.3*    Studies/Results: No results found.     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 Hopefully with time this is able to help her anemia. She is on BID PPI. Previously attempted Coreg  but did not tolerate due to hypotension, now on midodrine .   Pending labs stable for this AM, I think okay to go home. Would add oral carafate  for a few days post APC treatment. At some point could consider adding low dose propranolol or nadolol as outpatient to see if she would tolerate this better than Coreg . She should have a follow up EGD in a few months with repeat RFA if needed, we can coordinate follow up with Dr. Brice Campi as outpatient. Also should have follow up labs done next week to make sure stable.  PLAN: - await pending labs, if stable I think can go home today - continue protonix  40mg  BID as outpatient - would add oral carafate  table 1gm TID for next 5-7 days or so - continue midodrine  - consider attempt at low dose propranolol or nadolol if she is willing (intolerance of Coreg ), can discuss as outpatient - outpatient lab to check Hgb early next week, our office can coordinate labs and  outpatient follow up.  We will sign off for now, please call with questions moving forward.  Christi Coward, MD [facility]

## 2023-12-23 NOTE — Progress Notes (Signed)
 Reviewed AVS, patient expressed understanding of medications, MD follow up reviewed.   Removed IV, Site clean, dry and intact.  Patient states all belongings brought to the hospital at time of admission are accounted for and packed to take home.  Picked up medications from [facility] pharmacy. Pt transported to entrance A where family member was waiting in vehicle to transport home.

## 2023-12-23 NOTE — Discharge Summary (Signed)
 Physician Discharge Summary   Patient: Sierra Logan MRN: [id number] DOB: [birth year]  Admit date:     12/20/2023  Discharge date: 12/23/23  Discharge Physician: Aisha Hove   PCP: Adelia Homestead, MD   Recommendations at discharge:    PCP follow up in 1 week. GI follow up as scheduled with Hb check in a week.  Discharge Diagnoses: Principal Problem:   Symptomatic anemia Active Problems:   Acute blood loss anemia   Controlled type 2 diabetes mellitus without complication, without long-term current use of insulin (HCC)   Chronic respiratory failure with hypoxia (HCC)   Tobacco abuse   GAVE (gastric antral vascular ectasia)   Secondary esophageal varices without bleeding (HCC)   Portal hypertensive gastropathy (HCC)  Resolved Problems:   * No resolved hospital problems. *  Hospital Course: Sierra Logan is a 68 y.o. female with medical history significant for decompensated cirrhosis, esophageal varices seen on EGD 11/09/2023, history of paracentesis and thoracentesis, history of GI bleed requiring blood transfusion, who initially presented to her PCPs office today as a follow-up after discharge from SNF.  She reported tiredness, generalized fatigue and lightheadedness worse in the morning associated with loose stools.  She had CBC done in her PCPs office which returned with significant drop in her hemoglobin 5.2 from 7.7 on 11/23/2023.  Due to concern for symptomatic anemia and possible recurrent GI bleed the patient was advised to go to the ER for blood transfusion and further evaluation.  Endorses dark stools noted about a week ago.    Assessment and Plan: Symptomatic anemia Acute blood loss anemia Decompensated cirrhosis with esophageal varices seen on EGD 11/09/2023 Hemoglobin on presentation 4.7 MCV 80, Positive FOBT S/p 2 unit PRBCs transfused. Repeat Hb 7.7 today. S/p EGD - showed grade II esophageal varices. Portal hypertensive gastropathy, GAVE, portal  hypertensive findings. She received IV Rocephin , octreotide  drip for possible active GI bleeding.  Protonix  40mg  twice daily, Carafate  prescribed as recommended by GI. Monitor h/h closely and transfuse for Hb<7. Able to tolerate diet wishes to go home. I advised to follow with PCP, GI upon discharge with repeat Hb check. She understands and agrees,   Chronic hypotension in the setting of decompensated cirrhosis Complicated by acute blood loss anemia Resumed home midodrine . BP better. Started low dose Propanolol.   Euvolemic hyponatremia Serum sodium 134 Stable.   Generalized weakness, likely multifactorial Secondary to advanced cirrhosis and symptomatic anemia Continue to treat underlying conditions Continue fall precautions.          Consultants: GI Procedures performed: EGD  Disposition: Home Diet recommendation:  Discharge Diet Orders (From admission, onward)     Start     Ordered   12/23/23 0000  Diet - low sodium heart healthy        12/23/23 1202           Cardiac diet DISCHARGE MEDICATION: Allergies as of 12/23/2023   No Known Allergies      Medication List     STOP taking these medications    ibuprofen 200 MG tablet Commonly known as: ADVIL   spironolactone  100 MG tablet Commonly known as: ALDACTONE        TAKE these medications    acetaminophen  500 MG tablet Commonly known as: TYLENOL  Take 1 tablet (500 mg total) by mouth every 6 (six) hours as needed for mild pain (pain score 1-3) (or Fever >/= 101).   atorvastatin  10 MG tablet Commonly known as: LIPITOR Take 1 tablet (10 mg  total) by mouth at bedtime.   BIOTIN PO Take 1 tablet by mouth daily with breakfast.   CALCIUM +D3 PO Take 1 tablet by mouth daily after breakfast.   FISH OIL PO Take 1 capsule by mouth every evening.   furosemide  40 MG tablet Commonly known as: LASIX  Take 1 tablet (40 mg total) by mouth daily.   gabapentin  400 MG capsule Commonly known as:  NEURONTIN  TAKE 1 CAPSULE EVERY MORNING AND 2 AT BEDTIME What changed:  how much to take how to take this when to take this   midodrine  5 MG tablet Commonly known as: PROAMATINE  Take 3 tablets (15 mg total) by mouth 3 (three) times daily with meals.   pantoprazole  40 MG tablet Commonly known as: PROTONIX  Take 1 tablet (40 mg total) by mouth 2 (two) times daily.   propranolol 10 MG tablet Commonly known as: INDERAL Take 1 tablet (10 mg total) by mouth 3 (three) times daily.   sucralfate  1 GM/10ML suspension Commonly known as: CARAFATE  Take 10 mLs (1 g total) by mouth with breakfast, with lunch, and with evening meal.        Follow-up Information     Adelia Homestead, MD Follow up in 1 week(s).   Specialty: Internal Medicine Contact information: [address] [place] [address] [phone]                Discharge Exam: Sierra Logan Weights   12/21/23 1140 12/22/23 0325 12/23/23 0521  Weight: 71.4 kg 72 kg 72.8 kg      12/23/2023   11:32 AM 12/23/2023    7:56 AM 12/23/2023    5:21 AM  Vitals with BMI  Weight   160 lbs 8 oz  BMI   29.35  Systolic 105 120 914  Diastolic 54 40 55  Pulse 78 90 81    General - Elderly Caucasian female, no apparent distress HEENT - PERRLA, EOMI, atraumatic head, non tender sinuses. Lung - Clear, basal rales, rhonchi, wheezes. Heart - S1, S2 heard, no murmurs, rubs, trace pedal edema. Abdomen - Soft, non tender, distended, bowel sounds good Neuro - Alert, awake and oriented x 3, non focal exam. Skin - Warm and dry.  Condition at discharge: stable  The results of significant diagnostics from this hospitalization (including imaging, microbiology, ancillary and laboratory) are listed below for reference.   Imaging Studies: DG Chest Port 1 View Result Date: 12/20/2023 CLINICAL DATA:  Weakness EXAM: PORTABLE CHEST 1 VIEW COMPARISON:  Chest x-ray 11/21/2023 FINDINGS: The heart size and mediastinal contours are within normal  limits. Both lungs are clear. The visualized skeletal structures are unremarkable. IMPRESSION: No active disease. Electronically Signed   By: Tyron Gallon M.D.   On: 12/20/2023 19:20    Microbiology: Results for orders placed or performed during the hospital encounter of 11/07/23  MRSA Next Gen by PCR, Nasal     Status: None   Collection Time: 11/08/23  5:17 PM   Specimen: Nasal Mucosa; Nasal Swab  Result Value Ref Range Status   MRSA by PCR Next Gen NOT DETECTED NOT DETECTED Final    Comment: (NOTE) The GeneXpert MRSA Assay (FDA approved for NASAL specimens only), is one component of a comprehensive MRSA colonization surveillance program. It is not intended to diagnose MRSA infection nor to guide or monitor treatment for MRSA infections. Test performance is not FDA approved in patients less than 75 years old. Performed at [facility], [address], [place], [place] [address]  Culture, body fluid w Gram Stain-bottle     Status: None   Collection Time: 11/10/23  8:25 AM   Specimen: Peritoneal Washings  Result Value Ref Range Status   Specimen Description PERITONEAL  Final   Special Requests NONE  Final   Culture   Final    NO GROWTH 5 DAYS Performed at [facility], [address], [place], [place] [address]    Report Status 11/15/2023 FINAL  Final  Gram stain     Status: None   Collection Time: 11/10/23  8:30 AM   Specimen: Peritoneal Washings  Result Value Ref Range Status   Specimen Description PERITONEAL  Final   Special Requests NONE  Final   Gram Stain   Final    CYTOSPIN SMEAR WBC SEEN NO ORGANISMS SEEN Performed at [facility], [address], [place], [place] [address]    Report Status 11/10/2023 FINAL  Final    Labs: CBC: Recent Labs  Lab 12/20/23 1130 12/20/23 1627 12/21/23 0336 12/21/23 1507 12/22/23 0252 12/23/23 0920  WBC 8.7 8.8 6.0  --  6.0  --   NEUTROABS 6.6  --   --   --  4.1  --   HGB 5.2 Repeated and verified X2.*  4.7* 7.3* 7.8* 7.3* 7.7*  HCT 17.4 Repeated and verified X2.* 16.5* 24.1* 24.9* 24.4* 26.0*  MCV 73.8* 80.1 83.4  --  83.6  --   PLT 223.0 190 141*  --  145*  --    Basic Metabolic Panel: Recent Labs  Lab 12/20/23 1130 12/20/23 1627 12/21/23 0336 12/23/23 0920  NA 134* 131* 134* 134*  K 3.3* 3.8 3.5 3.9  CL 95* 94* 97* 101  CO2 32 25 27 24   GLUCOSE 80 111* 79 143*  BUN 15 14 14 8   CREATININE 0.79 0.86 0.83 0.72  CALCIUM  8.7 8.0* 7.8* 8.5*  MG  --   --  1.7  --   PHOS  --   --  4.0  --    Liver Function Tests: Recent Labs  Lab 12/20/23 1130 12/20/23 1627 12/21/23 0336  AST 25 29 25   ALT 16 18 17   ALKPHOS 133* 114 104  BILITOT 0.8 0.9 1.2  PROT 7.3 6.8 6.5  ALBUMIN  3.4* 2.7* 2.5*   CBG: Recent Labs  Lab 12/21/23 0800  GLUCAP 84    Discharge time spent: 34 minutes.  Signed: Aisha Hove, MD [facility] 12/23/2023

## 2023-12-23 NOTE — Plan of Care (Signed)
  Problem: Clinical Measurements: Goal: Diagnostic test results will improve Outcome: Progressing   Problem: Activity: Goal: Risk for activity intolerance will decrease Outcome: Progressing   Problem: Coping: Goal: Level of anxiety will decrease Outcome: Progressing   

## 2023-12-23 NOTE — Telephone Encounter (Signed)
 CBC order placed in EPIC for 12/26/23

## 2023-12-24 LAB — TYPE AND SCREEN
ABO/RH(D): O POS
Antibody Screen: POSITIVE
Donor AG Type: NEGATIVE
Donor AG Type: NEGATIVE
Donor AG Type: NEGATIVE
Donor AG Type: NEGATIVE
Donor AG Type: NEGATIVE
Unit division: 0
Unit division: 0
Unit division: 0
Unit division: 0
Unit division: 0

## 2023-12-24 LAB — BPAM RBC
Blood Product Expiration Date: 202507072359
Blood Product Expiration Date: 202507072359
Blood Product Expiration Date: 202507102359
Blood Product Expiration Date: 202507102359
Blood Product Expiration Date: 202507112359
ISSUE DATE / TIME: 202506031951
ISSUE DATE / TIME: 202506032240
Unit Type and Rh: 5100
Unit Type and Rh: 5100
Unit Type and Rh: 5100
Unit Type and Rh: 5100
Unit Type and Rh: 5100

## 2023-12-25 ENCOUNTER — Encounter (HOSPITAL_COMMUNITY): Payer: Self-pay | Admitting: Pediatrics

## 2023-12-26 ENCOUNTER — Telehealth: Payer: Self-pay | Admitting: Internal Medicine

## 2023-12-26 ENCOUNTER — Other Ambulatory Visit (INDEPENDENT_AMBULATORY_CARE_PROVIDER_SITE_OTHER)

## 2023-12-26 ENCOUNTER — Telehealth: Payer: Self-pay

## 2023-12-26 DIAGNOSIS — D500 Iron deficiency anemia secondary to blood loss (chronic): Secondary | ICD-10-CM

## 2023-12-26 DIAGNOSIS — K31819 Angiodysplasia of stomach and duodenum without bleeding: Secondary | ICD-10-CM

## 2023-12-26 DIAGNOSIS — D62 Acute posthemorrhagic anemia: Secondary | ICD-10-CM

## 2023-12-26 LAB — CBC WITH DIFFERENTIAL/PLATELET
Basophils Absolute: 0.1 10*3/uL (ref 0.0–0.1)
Basophils Relative: 0.9 % (ref 0.0–3.0)
Eosinophils Absolute: 0 10*3/uL (ref 0.0–0.7)
Eosinophils Relative: 0.5 % (ref 0.0–5.0)
HCT: 23.8 % — CL (ref 36.0–46.0)
Hemoglobin: 7.5 g/dL — CL (ref 12.0–15.0)
Lymphocytes Relative: 22.9 % (ref 12.0–46.0)
Lymphs Abs: 1.8 10*3/uL (ref 0.7–4.0)
MCHC: 31.4 g/dL (ref 30.0–36.0)
MCV: 78.1 fl (ref 78.0–100.0)
Monocytes Absolute: 0.9 10*3/uL (ref 0.1–1.0)
Monocytes Relative: 11.6 % (ref 3.0–12.0)
Neutro Abs: 5.1 10*3/uL (ref 1.4–7.7)
Neutrophils Relative %: 64.1 % (ref 43.0–77.0)
Platelets: 173 10*3/uL (ref 150.0–400.0)
RBC: 3.05 Mil/uL — ABNORMAL LOW (ref 3.87–5.11)
RDW: 22 % — ABNORMAL HIGH (ref 11.5–15.5)
WBC: 8 10*3/uL (ref 4.0–10.5)

## 2023-12-26 NOTE — Telephone Encounter (Signed)
Fine for orders 

## 2023-12-26 NOTE — Telephone Encounter (Signed)
 Rovonda, I have just read through the patient's chart. Her hemoglobin is stable since her discharge. She had treatment of her GAVE with APC. The patient is unchanged since her discharge, then no additional blood transfusions will be required. I recommend that she be on oral iron once daily ferrous gluconate or ferrous sulfate . Urgent referral to hematology for IV iron infusions. Set up for EGD with RFA/APC/banding in 6 to 10 weeks with me. She should have a clinic follow-up set up as well, per Dr. Tena Feeling notation. Thanks. GM

## 2023-12-26 NOTE — Telephone Encounter (Signed)
 Copied from CRM [id number]. Topic: Clinical - Home Health Verbal Orders >> Dec 26, 2023  9:47 AM Gates Kasal P wrote: Caller/Agency: Angelia Kelp - RN [facility]  Callback Number: [phone] - Secured Line  Service Requested: Occupational Therapy and Physical Therapy. Skilled Nursing  Any new concerns about the patient? Yes - pt was in hospital and discharged on 06/06 And resume care. Orders will be sent over after eval

## 2023-12-26 NOTE — Telephone Encounter (Signed)
 Spoke to patient and reminded her to come for labs at [facility] either today, 12/26/23 or tomorrow. She verbalizes understanding.

## 2023-12-26 NOTE — Telephone Encounter (Signed)
 FYI- Dr Brice Campi  Received call from Mariah Shines in the lab with critical HGB of 7.5 and HCT 23.8.

## 2023-12-27 ENCOUNTER — Ambulatory Visit: Payer: Self-pay | Admitting: Gastroenterology

## 2023-12-27 ENCOUNTER — Telehealth: Payer: Self-pay | Admitting: Internal Medicine

## 2023-12-27 NOTE — Telephone Encounter (Signed)
 Copied from CRM [id number]. Topic: Clinical - Order For Equipment >> Dec 27, 2023  1:11 PM Kita Perish H wrote: Reason for CRM: Patient is calling stating she needs oxygen supplies and not sure where original supplies came from. States she needs some more to go tanks and hoses. Patient is temporarily staying with her sister and would want supplies mailed there temporary address is on patients profile, please reach out to patient.  Brinda [phone]

## 2023-12-27 NOTE — Anesthesia Postprocedure Evaluation (Signed)
 Anesthesia Post Note  Patient: Sierra Logan  Procedure(s) Performed: EGD (ESOPHAGOGASTRODUODENOSCOPY) EGD, WITH ARGON PLASMA COAGULATION     Patient location during evaluation: Endoscopy Anesthesia Type: MAC Level of consciousness: awake and alert Pain management: pain level controlled Vital Signs Assessment: post-procedure vital signs reviewed and stable Respiratory status: spontaneous breathing, nonlabored ventilation and respiratory function stable Cardiovascular status: stable and blood pressure returned to baseline Postop Assessment: no apparent nausea or vomiting Anesthetic complications: no   No notable events documented.                Denis Koppel

## 2023-12-28 ENCOUNTER — Telehealth: Payer: Self-pay | Admitting: Internal Medicine

## 2023-12-28 MED ORDER — FERROUS GLUCONATE 324 (38 FE) MG PO TABS: 324.0000 mg | ORAL_TABLET | Freq: Every day | ORAL | 1 refills | Status: DC

## 2023-12-28 NOTE — Addendum Note (Signed)
 Addended byPatricio Boop on: 12/28/2023 05:51 PM   Modules accepted: Orders

## 2023-12-28 NOTE — Telephone Encounter (Signed)
 Patient informed that urgent referral was placed to hematology for IV iron infusions. Rx for Ferrous Gluconate sent to [facility] pharmacy. I will get patient scheduled for EGD with RFA/APC/banding and contact her tomorrow with date and time. Patient voiced understanding.

## 2023-12-28 NOTE — Telephone Encounter (Unsigned)
 Copied from CRM [id number]. Topic: General - Call Back - No Documentation >> Dec 28, 2023 11:20 AM Clyde Darling P wrote: Reason for CRM: Pt advise she received a miss call about her appt scheduled for 07/1, there are no notes indicating a call, but if there is anything wrong with appt, reach out to PT [phone]

## 2023-12-30 NOTE — Telephone Encounter (Signed)
 Can you help us  with this case, I don't see where it got set up.

## 2023-12-30 NOTE — Telephone Encounter (Signed)
 She will need to contact the home health company supplying oxygen.

## 2023-12-30 NOTE — Telephone Encounter (Signed)
 Inbound call from patient, following up on call below. States she did not receive a call with date and times for EGD.

## 2023-12-30 NOTE — Telephone Encounter (Signed)
 I spoke with Sierra Logan and told her we will be back in touch early next week with more information about her procedure date/time.

## 2023-12-30 NOTE — Telephone Encounter (Signed)
 Looks like Ro is working on it. She may already have somewhere in mind.  If she needs help Monday I will be available.

## 2023-12-30 NOTE — Telephone Encounter (Signed)
 Called and gave ok verbals. Also informed them that we also have paper work that needs to be sign but I have informed them that it will not be signed until provider returns in office that verbalized that they understood

## 2024-01-03 ENCOUNTER — Telehealth: Payer: Self-pay

## 2024-01-03 ENCOUNTER — Ambulatory Visit: Admitting: Gastroenterology

## 2024-01-03 NOTE — Telephone Encounter (Signed)
 RE: Referral Closure Received: Today Mansouraty, Albino Alu., MD  Erline Hawthorn; Aneita Keens, RN Thank you for trying.  Kerby Borner, Please try to reach out to the patient and see if we can get a hold of her.  If she agrees, she needs referral back to hematology/oncology for IV iron infusions. If she disagrees, that is certainly her right. She should have repeat blood count within the next week. If hemoglobin less than 7 she will be recommended to go to the hospital. Thanks. GM       Previous Messages  Referral Referral # [id number] Referral Information  Referral # Creation Date Referral Status Status Update   [id number] 12/28/2023 Closed 01/03/2024: Status History     Status Reason Referral Type Referral Reasons Referral Class  Unable to Schedule/Reschedule Consultation Specialty Services Required Internal     To Provider Specialty To Provider To Location/Place of Service To Department  Oncology none none [facility]    To Vendor Referred By By Location/Place of Service By Department  none Mansouraty, Albino Alu., MD [facility]     Priority Start Date Expiration Date Referral Entered By  Urgent 12/28/2023 12/27/2024 Patricio Boop, CMA     Visits Requested Visits Authorized Visits Completed Visits Scheduled  1 1      Procedure Information  Service Details Procedure Modifiers Provider Requested Approved  [id number] - AMB REFERRAL TO HEMATOLOGY / ONCOLOGY none  1 1   Diagnosis Information  Diagnosis  D62 (ICD-10-CM) - Acute blood loss anemia  K31.819 (ICD-10-CM) - GAVE (gastric antral vascular ectasia)  D50.0 (ICD-10-CM) - Iron deficiency anemia due to chronic blood loss   Referral Notes Number of Notes: 4 . Type Date User Summary Attachment  General 01/03/2024  1:04 PM Mansouraty, Albino Alu., MD Auto: Referral message -  Note: ----- Message ----- From: Normie Becton., MD Sent: 01/03/2024   1:04 PM EDT To: Aneita Keens, RN; Erline Hawthorn Subject: RE: Referral Closure                            Thank you for trying.   Shylee Durrett, Please try to reach out to the patient and see if we can get a hold of her.  If she agrees, she needs referral back to hematology/oncology for IV iron infusions. If she disagrees, that is certainly her right. She should have repeat blood count within the next week. If hemoglobin less than 7 she will be recommended to go to the hospital. Thanks. GM ----- Message ----- From: Erline Hawthorn Sent: 01/03/2024   1:00 PM EDT To: Normie Becton., MD Subject: Referral Closure                                Hi Dr. Brice Campi,    We have attempted to contact patient multiple times to schedule a NP appt or return est care to Dr. Rosaline [facility]a at [facility]. At this time we are closing the referral as we have been unsuccessful with connecting with he patient. If patient wishes to proceed with scheduling, please submit a new referral to either Heme/ [facility] or [facility] at [facility]. . Type Date User Summary Attachment  General 01/03/2024  1:00 PM Erline Hawthorn Auto: Referral message -  Note: ----- Message ----- From: Erline Hawthorn Sent: 01/03/2024   1:00 PM EDT To: Coye Diver Mansouraty  Marieta Shorten., MD Subject: Referral Closure                                Hi Dr. Brice Campi,    We have attempted to contact patient multiple times to schedule a NP appt or return est care to Dr. [facility]aline Com[facility] at [facility]. At this time we are closing the referral as we have been unsuccessful with connecting with he patient. If patient wishes to proceed with scheduling, please submit a new referral to either Heme/ [facility] or Dr. [facility] Coma at [facility]. . Type Date User Summary Attachment  General 12/29/2023  1:18 PM O'Kelley, Jordis Nevins, RN Auto: Referral message -  Note: ----- Message ----- From: Rosslyn Coons, RN Sent: 12/29/2023   1:18 PM EDT To: Erline Hawthorn Subject: Established pt of Dr Sherolyn Dixon,   She has seen Dr Rosaline Coma for anemia. Last visit with him was 2023   Please ask pt about returning to Dr Rosaline Coma prior to scheduling here   Thanks Jenette Mitchell . Type Date User Summary Attachment  General 12/29/2023  9:51 AM O'Kelley, Jordis Nevins, RN Auto: Referral message -  Note: ----- Message ----- From: Rosslyn Coons, RN Sent: 12/29/2023   9:51 AM EDT To: Erline Hawthorn Subject: New Heme appt                                   Please schedule in Next New patient appt please   Thanks Jenette Mitchell Referral Order  Order  Ambulatory referral to Hematology / Oncology (Order # [id number]) on 12/28/2023

## 2024-01-03 NOTE — Telephone Encounter (Signed)
Unable to reach pt by phone letter mailed.  

## 2024-01-06 ENCOUNTER — Telehealth: Payer: Self-pay | Admitting: *Deleted

## 2024-01-06 ENCOUNTER — Other Ambulatory Visit: Payer: Self-pay

## 2024-01-06 DIAGNOSIS — K31819 Angiodysplasia of stomach and duodenum without bleeding: Secondary | ICD-10-CM

## 2024-01-06 NOTE — Addendum Note (Signed)
 Addended by: Patricio Boop on: 01/06/2024 04:29 PM   Modules accepted: Orders

## 2024-01-06 NOTE — Telephone Encounter (Addendum)
 Returned call to patient she has been scheduled for 02/14/24 at [facility] for EGD with RFA. Instructions will be available for patient at her follow-up visit with Valiant Gaul, PA-C on 01/25/24. Left voicemail with detailed message. Patient instructions are located under letters in patients chart.

## 2024-01-06 NOTE — Telephone Encounter (Signed)
 Elsa Halls, RN  Aneita Keens, RN Sierra Logan just called [facility] campus. I provider her the number for [facility] to call new patient referrals to get in with Dr. Rosaline Coma. I made her aware that they have been trying to reach her without success. She apologized and agrees to call today. Thanks

## 2024-01-06 NOTE — Telephone Encounter (Signed)
 Sierra Logan called to request appointment w/Dr. Rosaline Coma. Noted referral placed by GI, but [facility] has not been able to reach her. Provided her the phone number for [facility] campus and instructed her to ask for new patient referrals when she calls. Notified of GI

## 2024-01-09 ENCOUNTER — Telehealth: Payer: Self-pay | Admitting: Gastroenterology

## 2024-01-09 DIAGNOSIS — R601 Generalized edema: Secondary | ICD-10-CM | POA: Diagnosis not present

## 2024-01-09 DIAGNOSIS — J90 Pleural effusion, not elsewhere classified: Secondary | ICD-10-CM | POA: Diagnosis not present

## 2024-01-09 NOTE — Telephone Encounter (Signed)
 Inbound call from patient requesting a call to discuss upcoming procedure on 7/29. States she has further questions she would like to discuss. Please advise, thank you

## 2024-01-09 NOTE — Telephone Encounter (Signed)
 The pt has been advised of the appt for procedure. All information has been discussed and questions answered.  She will also let us  know if she does not receive the written information. All instructions also in My Chart

## 2024-01-17 ENCOUNTER — Encounter: Payer: Self-pay | Admitting: Internal Medicine

## 2024-01-17 ENCOUNTER — Ambulatory Visit (INDEPENDENT_AMBULATORY_CARE_PROVIDER_SITE_OTHER): Admitting: Internal Medicine

## 2024-01-17 ENCOUNTER — Telehealth: Payer: Self-pay

## 2024-01-17 VITALS — BP 136/86 | HR 82 | Temp 98.5°F | Ht 62.0 in | Wt 175.0 lb

## 2024-01-17 DIAGNOSIS — R7303 Prediabetes: Secondary | ICD-10-CM

## 2024-01-17 DIAGNOSIS — D500 Iron deficiency anemia secondary to blood loss (chronic): Secondary | ICD-10-CM

## 2024-01-17 DIAGNOSIS — K31819 Angiodysplasia of stomach and duodenum without bleeding: Secondary | ICD-10-CM | POA: Diagnosis not present

## 2024-01-17 DIAGNOSIS — D62 Acute posthemorrhagic anemia: Secondary | ICD-10-CM

## 2024-01-17 DIAGNOSIS — K743 Primary biliary cirrhosis: Secondary | ICD-10-CM

## 2024-01-17 LAB — FERRITIN: Ferritin: 12.1 ng/mL (ref 10.0–291.0)

## 2024-01-17 LAB — COMPREHENSIVE METABOLIC PANEL WITH GFR
ALT: 13 U/L (ref 0–35)
AST: 27 U/L (ref 0–37)
Albumin: 2.9 g/dL — ABNORMAL LOW (ref 3.5–5.2)
Alkaline Phosphatase: 148 U/L — ABNORMAL HIGH (ref 39–117)
BUN: 11 mg/dL (ref 6–23)
CO2: 32 meq/L (ref 19–32)
Calcium: 8.4 mg/dL (ref 8.4–10.5)
Chloride: 101 meq/L (ref 96–112)
Creatinine, Ser: 0.54 mg/dL (ref 0.40–1.20)
GFR: 95.04 mL/min (ref >60.00)
Glucose, Bld: 125 mg/dL — ABNORMAL HIGH (ref 70–99)
Potassium: 3.8 meq/L (ref 3.5–5.1)
Sodium: 138 meq/L (ref 135–145)
Total Bilirubin: 0.7 mg/dL (ref 0.2–1.2)
Total Protein: 6.9 g/dL (ref 6.0–8.3)

## 2024-01-17 LAB — CBC
HCT: 25.7 % — ABNORMAL LOW (ref 36.0–46.0)
Hemoglobin: 7.7 g/dL — CL (ref 12.0–15.0)
MCHC: 30.1 g/dL (ref 30.0–36.0)
MCV: 77.9 fl — ABNORMAL LOW (ref 78.0–100.0)
Platelets: 209 10*3/uL (ref 150.0–400.0)
RBC: 3.3 Mil/uL — ABNORMAL LOW (ref 3.87–5.11)
RDW: 22 % — ABNORMAL HIGH (ref 11.5–15.5)
WBC: 5.9 10*3/uL (ref 4.0–10.5)

## 2024-01-17 MED ORDER — PROPRANOLOL HCL 10 MG PO TABS: 10.0000 mg | ORAL_TABLET | Freq: Three times a day (TID) | ORAL | 11 refills | Status: DC

## 2024-01-17 MED ORDER — FUROSEMIDE 40 MG PO TABS: 40.0000 mg | ORAL_TABLET | Freq: Every day | ORAL | 1 refills | Status: AC

## 2024-01-17 NOTE — Assessment & Plan Note (Signed)
 Stable overall checking CMP today.

## 2024-01-17 NOTE — Patient Instructions (Signed)
 We will check the labs today and if needed we can arrange the IV iron.

## 2024-01-17 NOTE — Assessment & Plan Note (Signed)
 Checking CBC and ferritin and if low still will order IV iron transfusion.

## 2024-01-17 NOTE — Assessment & Plan Note (Addendum)
 Hga1c in normal range most recently and never on meds. Had 1 reading in 2023 6.5 which was never repeated. Foot exam done.

## 2024-01-17 NOTE — Progress Notes (Signed)
   Subjective:   Patient ID: Sierra Logan, female    DOB: 1955-09-21, 68 y.o.   MRN: [id number]  HPI The patient is a 68 YO female coming in for follow up. Has been back in the hospital since our last visit due to low Hg. She has had repeat about 1 month ago (about 3 days after discharge) which was stable. Scheduled for radiofrequency ablation later this month with GI. Has not gotten IV iron. Denies blood in stool. Denies low BP or dizziness. Overall fatigued and some SOB on exertion (stable from prior).   Review of Systems  Constitutional:  Positive for fatigue.  HENT: Negative.    Eyes: Negative.   Respiratory:  Positive for shortness of breath. Negative for cough and chest tightness.   Cardiovascular:  Negative for chest pain, palpitations and leg swelling.  Gastrointestinal:  Negative for abdominal distention, abdominal pain, constipation, diarrhea, nausea and vomiting.  Musculoskeletal: Negative.   Skin: Negative.   Neurological: Negative.   Psychiatric/Behavioral: Negative.      Objective:  Physical Exam Constitutional:      Appearance: She is well-developed.  HENT:     Head: Normocephalic and atraumatic.   Cardiovascular:     Rate and Rhythm: Normal rate and regular rhythm.  Pulmonary:     Effort: Pulmonary effort is normal. No respiratory distress.     Breath sounds: Normal breath sounds. No wheezing or rales.  Abdominal:     General: Bowel sounds are normal. There is distension.     Palpations: Abdomen is soft.     Tenderness: There is no abdominal tenderness. There is no rebound.   Musculoskeletal:     Cervical back: Normal range of motion.   Skin:    General: Skin is warm and dry.   Neurological:     Mental Status: She is alert and oriented to person, place, and time.     Coordination: Coordination normal.     Vitals:   01/17/24 1404  BP: 136/86  Pulse: 82  Temp: 98.5 F (36.9 C)  TempSrc: Oral  SpO2: 92%  Weight: 175 lb (79.4 kg)  Height: 5' 2  (1.575 m)    Assessment & Plan:  Visit time 20 minutes in face to face communication with patient and coordination of care, additional 10 minutes spent in record review, coordination or care, ordering tests, communicating/referring to other healthcare professionals, documenting in medical records all on the same day of the visit for total time 30 minutes spent on the visit.

## 2024-01-17 NOTE — Assessment & Plan Note (Signed)
 Checking CBC and ferritin.

## 2024-01-17 NOTE — Assessment & Plan Note (Signed)
 No clinical signs of bleeding checking CBC and ferritin.

## 2024-01-18 ENCOUNTER — Other Ambulatory Visit: Payer: Self-pay | Admitting: Internal Medicine

## 2024-01-18 ENCOUNTER — Telehealth: Payer: Self-pay

## 2024-01-18 ENCOUNTER — Ambulatory Visit: Payer: Self-pay | Admitting: Internal Medicine

## 2024-01-18 NOTE — Telephone Encounter (Signed)
 This critical was routed to DOD as Md was out office.

## 2024-01-18 NOTE — Telephone Encounter (Signed)
 Dr. Rollene, patient will be scheduled as soon as possible.  Auth Submission: APPROVED Site of care: Site of care: [facility] Payer: [employer] Medication & CPT/J Code(s) submitted: Feraheme (ferumoxytol) [id number] Diagnosis Code:  Route of submission (phone, fax, portal): fax Phone # Fax # [phone] Auth type: Buy/Bill PB Units/visits requested: 510mg  x 2 doses Reference number: [id number] Approval from: 01/18/24 to 04/17/24

## 2024-01-19 ENCOUNTER — Ambulatory Visit

## 2024-01-19 VITALS — BP 102/65 | HR 79 | Temp 98.4°F | Resp 16 | Ht 62.0 in | Wt 179.2 lb

## 2024-01-19 DIAGNOSIS — D500 Iron deficiency anemia secondary to blood loss (chronic): Secondary | ICD-10-CM

## 2024-01-19 DIAGNOSIS — D62 Acute posthemorrhagic anemia: Secondary | ICD-10-CM

## 2024-01-19 DIAGNOSIS — R195 Other fecal abnormalities: Secondary | ICD-10-CM

## 2024-01-19 MED ORDER — SODIUM CHLORIDE 0.9 % IV SOLN
510.0000 mg | Freq: Once | INTRAVENOUS | Status: AC
  Administered 2024-01-19: 510 mg via INTRAVENOUS
  Filled 2024-01-19: qty 17

## 2024-01-19 NOTE — Progress Notes (Signed)
 Diagnosis: Iron Deficiency Anemia  Provider:  Praveen Mannam MD  Procedure: IV Infusion  IV Type: Peripheral, IV Location: R Antecubital  Feraheme (Ferumoxytol), Dose: 510 mg  Infusion Start Time: 1537  Infusion Stop Time: 1558   Post Infusion IV Care: Observation period completed and Peripheral IV Discontinued  Discharge: Condition: Good, Destination: Home . AVS Declined  Performed by:  Maximiano JONELLE Pouch, LPN

## 2024-01-24 ENCOUNTER — Telehealth: Payer: Self-pay

## 2024-01-24 DIAGNOSIS — Z122 Encounter for screening for malignant neoplasm of respiratory organs: Secondary | ICD-10-CM

## 2024-01-24 DIAGNOSIS — Z87891 Personal history of nicotine dependence: Secondary | ICD-10-CM

## 2024-01-24 DIAGNOSIS — F17210 Nicotine dependence, cigarettes, uncomplicated: Secondary | ICD-10-CM

## 2024-01-24 NOTE — Telephone Encounter (Signed)
 Lung Cancer Screening Narrative/Criteria Questionnaire (Cigarette Smokers Only- No Cigars/Pipes/vapes)   Sierra Logan   SDMV:01/27/2024 at 1:30pm        09-24-1955               LDCT: 01/31/2024 at 1:00 pm at GI    68 y.o.   Phone: [phone]  Lung Screening Narrative (confirm age 25-77 yrs Medicare / 50-80 yrs Private pay insurance)   Insurance information: [employer]   Referring Provider:Crawford, MD   This screening involves an initial phone call with a team member from our program. It is called a shared decision making visit. The initial meeting is required by  insurance and Medicare to make sure you understand the program. This appointment takes about 15-20 minutes to complete. You will complete the screening scan at your scheduled date/time.  This scan takes about 5-10 minutes to complete. You can eat and drink normally before and after the scan.  Criteria questions for Lung Cancer Screening:   Are you a current or former smoker? Current Age began smoking: 12   If you are a former smoker, what year did you quit smoking?Quit 1 year (within 15 yrs)   To calculate your smoking history, I need an accurate estimate of how many packs of cigarettes you smoked per day and for how many years. (Not just the number of PPD you are now smoking)   Years smoking 51 x Packs per day 1 = Pack years 51   (at least 20 pack yrs)   (Make sure they understand that we need to know how much they have smoked in the past, not just the number of PPD they are smoking now)  Do you have a personal history of cancer?  No    Do you have a family history of cancer? Yes  (cancer type and and relative) Grandfather with prostate cancer.   Are you coughing up blood?  No  Have you had unexplained weight loss of 15 lbs or more in the last 6 months? Yes  It looks like you meet all criteria.  When would be a good time for us  to schedule you for this screening?   Additional information: N/A

## 2024-01-25 ENCOUNTER — Ambulatory Visit: Admitting: Gastroenterology

## 2024-01-25 ENCOUNTER — Other Ambulatory Visit (INDEPENDENT_AMBULATORY_CARE_PROVIDER_SITE_OTHER)

## 2024-01-25 ENCOUNTER — Encounter: Payer: Self-pay | Admitting: Gastroenterology

## 2024-01-25 VITALS — BP 100/56 | HR 64 | Ht 62.0 in | Wt 170.1 lb

## 2024-01-25 DIAGNOSIS — D509 Iron deficiency anemia, unspecified: Secondary | ICD-10-CM

## 2024-01-25 DIAGNOSIS — K3189 Other diseases of stomach and duodenum: Secondary | ICD-10-CM | POA: Diagnosis not present

## 2024-01-25 DIAGNOSIS — G8929 Other chronic pain: Secondary | ICD-10-CM

## 2024-01-25 DIAGNOSIS — I8500 Esophageal varices without bleeding: Secondary | ICD-10-CM | POA: Diagnosis not present

## 2024-01-25 DIAGNOSIS — K7460 Unspecified cirrhosis of liver: Secondary | ICD-10-CM

## 2024-01-25 DIAGNOSIS — K31819 Angiodysplasia of stomach and duodenum without bleeding: Secondary | ICD-10-CM | POA: Diagnosis not present

## 2024-01-25 DIAGNOSIS — K219 Gastro-esophageal reflux disease without esophagitis: Secondary | ICD-10-CM

## 2024-01-25 DIAGNOSIS — K625 Hemorrhage of anus and rectum: Secondary | ICD-10-CM

## 2024-01-25 DIAGNOSIS — K5900 Constipation, unspecified: Secondary | ICD-10-CM | POA: Diagnosis not present

## 2024-01-25 DIAGNOSIS — Z860101 Personal history of adenomatous and serrated colon polyps: Secondary | ICD-10-CM

## 2024-01-25 DIAGNOSIS — R600 Localized edema: Secondary | ICD-10-CM | POA: Diagnosis not present

## 2024-01-25 DIAGNOSIS — K743 Primary biliary cirrhosis: Secondary | ICD-10-CM

## 2024-01-25 DIAGNOSIS — K766 Portal hypertension: Secondary | ICD-10-CM | POA: Diagnosis not present

## 2024-01-25 DIAGNOSIS — R1011 Right upper quadrant pain: Secondary | ICD-10-CM

## 2024-01-25 DIAGNOSIS — I8510 Secondary esophageal varices without bleeding: Secondary | ICD-10-CM

## 2024-01-25 LAB — CBC WITH DIFFERENTIAL/PLATELET
Basophils Absolute: 0 K/uL (ref 0.0–0.1)
Basophils Relative: 0.7 % (ref 0.0–3.0)
Eosinophils Absolute: 0.1 K/uL (ref 0.0–0.7)
Eosinophils Relative: 1.2 % (ref 0.0–5.0)
HCT: 31.1 % — ABNORMAL LOW (ref 36.0–46.0)
Hemoglobin: 9.5 g/dL — ABNORMAL LOW (ref 12.0–15.0)
Lymphocytes Relative: 25.3 % (ref 12.0–46.0)
Lymphs Abs: 1.5 K/uL (ref 0.7–4.0)
MCHC: 30.5 g/dL (ref 30.0–36.0)
MCV: 80.5 fl (ref 78.0–100.0)
Monocytes Absolute: 0.6 K/uL (ref 0.1–1.0)
Monocytes Relative: 9.6 % (ref 3.0–12.0)
Neutro Abs: 3.8 K/uL (ref 1.4–7.7)
Neutrophils Relative %: 63.2 % (ref 43.0–77.0)
Platelets: 209 K/uL (ref 150.0–400.0)
RBC: 3.86 Mil/uL — ABNORMAL LOW (ref 3.87–5.11)
RDW: 25.4 % — ABNORMAL HIGH (ref 11.5–15.5)
WBC: 5.9 K/uL (ref 4.0–10.5)

## 2024-01-25 LAB — COMPREHENSIVE METABOLIC PANEL WITH GFR
ALT: 12 U/L (ref 0–35)
AST: 27 U/L (ref 0–37)
Albumin: 2.8 g/dL — ABNORMAL LOW (ref 3.5–5.2)
Alkaline Phosphatase: 147 U/L — ABNORMAL HIGH (ref 39–117)
BUN: 8 mg/dL (ref 6–23)
CO2: 36 meq/L — ABNORMAL HIGH (ref 19–32)
Calcium: 8.4 mg/dL (ref 8.4–10.5)
Chloride: 100 meq/L (ref 96–112)
Creatinine, Ser: 0.61 mg/dL (ref 0.40–1.20)
GFR: 92.28 mL/min (ref >60.00)
Glucose, Bld: 78 mg/dL (ref 70–99)
Potassium: 3.5 meq/L (ref 3.5–5.1)
Sodium: 139 meq/L (ref 135–145)
Total Bilirubin: 0.8 mg/dL (ref 0.2–1.2)
Total Protein: 6.9 g/dL (ref 6.0–8.3)

## 2024-01-25 LAB — PROTIME-INR
INR: 1.3 ratio — ABNORMAL HIGH (ref 0.8–1.0)
Prothrombin Time: 13.9 s — ABNORMAL HIGH (ref 9.6–13.1)

## 2024-01-25 MED ORDER — SPIRONOLACTONE 25 MG PO TABS: 25.0000 mg | ORAL_TABLET | Freq: Every day | ORAL | 1 refills | Status: DC

## 2024-01-25 NOTE — Patient Instructions (Addendum)
 Your provider has requested that you go to the basement level for lab work before leaving today. Press B on the elevator. The lab is located at the first door on the left as you exit the elevator.  Please purchase the following medications over the counter and take as directed: Miralax  one capful daily.  You have been scheduled for an abdominal ultrasound at [facility] (1st floor of hospital) on 02/02/24 at 7:00am. Please arrive 30 minutes prior to your appointment for registration. Make certain not to have anything to eat or drink 6 hours prior to your appointment. Should you need to reschedule your appointment, please contact radiology at [phone]. This test typically takes about 30 minutes to perform.  We will be referring you to [facility]. They will call you with an appointment.   Low-Sodium Eating Plan Salt (sodium) helps you keep a healthy balance of fluids in your body. Too much sodium can raise your blood pressure. It can also cause fluid and waste to be held in your body. Your health care provider or dietitian may recommend a low-sodium eating plan if you have high blood pressure (hypertension), kidney disease, liver disease, or heart failure. Eating less sodium can help lower your blood pressure and reduce swelling. It can also protect your heart, liver, and kidneys. What are tips for following this plan? Reading food labels  Check food labels for the amount of sodium per serving. If you eat more than one serving, you must multiply the listed amount by the number of servings. Choose foods with less than 140 milligrams (mg) of sodium per serving. Avoid foods with 300 mg of sodium or more per serving. Always check how much sodium is in a product, even if the label says unsalted or no salt added. Shopping  Buy products labeled as low-sodium or no salt added. Buy fresh foods. Avoid canned foods and pre-made or frozen meals. Avoid canned,  cured, or processed meats. Buy breads that have less than 80 mg of sodium per slice. Cooking  Eat more home-cooked food. Try to eat less restaurant, buffet, and fast food. Try not to add salt when you cook. Use salt-free seasonings or herbs instead of table salt or sea salt. Check with your provider or pharmacist before using salt substitutes. Cook with plant-based oils, such as canola, sunflower, or olive oil. Meal planning When eating at a restaurant, ask if your food can be made with less salt or no salt. Avoid dishes labeled as brined, pickled, cured, or smoked. Avoid dishes made with soy sauce, miso, or teriyaki sauce. Avoid foods that have monosodium glutamate (MSG) in them. MSG may be added to some restaurant food, sauces, soups, bouillon, and canned foods. Make meals that can be grilled, baked, poached, roasted, or steamed. These are often made with less sodium. General information Try to limit your sodium intake to 1,500-2,300 mg each day, or the amount told by your provider. What foods should I eat? Fruits Fresh, frozen, or canned fruit. Fruit juice. Vegetables Fresh or frozen vegetables. No salt added canned vegetables. No salt added tomato sauce and paste. Low-sodium or reduced-sodium tomato and vegetable juice. Grains Low-sodium cereals, such as oats, puffed wheat and rice, and shredded wheat. Low-sodium crackers. Unsalted rice. Unsalted pasta. Low-sodium bread. Whole grain breads and whole grain pasta. Meats and other proteins Fresh or frozen meat, poultry, seafood, and fish. These should have no added salt. Low-sodium canned tuna and salmon. Unsalted nuts. Dried peas, beans, and lentils  without added salt. Unsalted canned beans. Eggs. Unsalted nut butters. Dairy Milk. Soy milk. Cheese that is naturally low in sodium, such as ricotta cheese, fresh mozzarella, or Swiss cheese. Low-sodium or reduced-sodium cheese. Cream cheese. Yogurt. Seasonings and condiments Fresh and  dried herbs and spices. Salt-free seasonings. Low-sodium mustard and ketchup. Sodium-free salad dressing. Sodium-free light mayonnaise. Fresh or refrigerated horseradish. Lemon juice. Vinegar. Other foods Homemade, reduced-sodium, or low-sodium soups. Unsalted popcorn and pretzels. Low-salt or salt-free chips. The items listed above may not be all the foods and drinks you can have. Talk to a dietitian to learn more. What foods should I avoid? Vegetables Sauerkraut, pickled vegetables, and relishes. Olives. [place] fries. Onion rings. Regular canned vegetables, except low-sodium or reduced-sodium items. Regular canned tomato sauce and paste. Regular tomato and vegetable juice. Frozen vegetables in sauces. Grains Instant hot cereals. Bread stuffing, pancake, and biscuit mixes. Croutons. Seasoned rice or pasta mixes. Noodle soup cups. Boxed or frozen macaroni and cheese. Regular salted crackers. Self-rising flour. Meats and other proteins Meat or fish that is salted, canned, smoked, spiced, or pickled. Precooked or cured meat, such as sausages or meat loaves. [place]. Ham. Pepperoni. Hot dogs. Corned beef. Chipped beef. Salt pork. Jerky. Pickled herring, anchovies, and sardines. Regular canned tuna. Salted nuts. Dairy Processed cheese and cheese spreads. Hard cheeses. Cheese curds. Blue cheese. Feta cheese. String cheese. Regular cottage cheese. Buttermilk. Canned milk. Fats and oils Salted butter. Regular margarine. Ghee. Bacon fat. Seasonings and condiments Onion salt, garlic salt, seasoned salt, table salt, and sea salt. Canned and packaged gravies. Worcestershire sauce. Tartar sauce. Barbecue sauce. Teriyaki sauce. Soy sauce, including reduced-sodium soy sauce. Steak sauce. Fish sauce. Oyster sauce. Cocktail sauce. Horseradish that you find on the shelf. Regular ketchup and mustard. Meat flavorings and tenderizers. Bouillon cubes. Hot sauce. Pre-made or packaged marinades. Pre-made or packaged taco  seasonings. Relishes. Regular salad dressings. Salsa. Other foods Salted popcorn and pretzels. Corn chips and puffs. Potato and tortilla chips. Canned or dried soups. Pizza. Frozen entrees and pot pies. The items listed above may not be all the foods and drinks you should avoid. Talk to a dietitian to learn more. This information is not intended to replace advice given to you by your health care provider. Make sure you discuss any questions you have with your health care provider. Document Revised: 07/22/2022 Document Reviewed: 07/22/2022 Elsevier Patient Education  2024 [employer].

## 2024-01-25 NOTE — Progress Notes (Signed)
 LAJUANA PATCHELL [id number] 10/18/1955   Chief Complaint: Hospital follow up, cirrhosis, anemia  Referring Provider: Rollene Almarie LABOR, * Primary GI MD: Dr. Wilhelmenia  HPI: CLARENE CURRAN is a 68 y.o. 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    11/07/2023 - 11/23/2023 patient admitted to [facility] for acute GI bleed after presenting to the ED with melanotic stools and swelling of the legs and feet.  Hemoglobin on presentation was 10.3.  CXR revealed moderate-sized left pleural effusion.  FOBT was positive.  Physical exam revealed anasarca.  Following admission she had an abrupt drop in her hemoglobin down to 6.5.  Underwent EGD that revealed bleeding gastric antrum vascular ectasias.  She was treated with APC, transfused 2 units PRBCs.  12/20/2023 - 12/23/2023 patient admitted to [name] for symptomatic anemia, acute blood loss anemia, after presenting to the ED reporting generalized fatigue and lightheadedness and associated loose stools.  CBC done in PCP office showed significant drop in hemoglobin from 7.7 to 5.2.  Due to concern for symptomatic anemia and possible recurrent GI bleed patient was advised to go to the ED for blood transfusion and further evaluation.  She endorsed dark stools noted about a week prior to presentation.  Hemoglobin on presentation was 4.7, MCV 80, positive FOBT.  She received 2 units PRBCs.  EGD showed grade 2 esophageal varices, portal hypertensive gastropathy, GAVE.  She received IV Rocephin , octreotide  drip for possible active GI bleeding.  Protonix  40 mg twice daily, Carafate  prescribed as recommended by GI.  Started low-dose propranolol .  Labs 01/17/2024: Hemoglobin 7.7,  hematocrit 25.7, MCV 77.9, platelets 209, alkaline phosphatase 148, AST 27, ALT 13, total bilirubin 0.7, albumin  2.9, ferritin 12.1  Patient is scheduled for EGD with radiofrequency ablation/APC with Dr. Wilhelmenia 02/14/2024.  Next iron infusion tomorrow.  She was last seen in our office by Dr. Wilhelmenia 12/02/2020.  At that time colonoscopy was recommended with 2-day prep within a year.   Patient states she had diarrhea for few days last week.  Had 1 day of frequent bowel movements.  After this she did notice a small amount of bright red blood on the toilet paper.  Denies any rectal pain.  She took 3 Imodium tablets which relieved diarrhea, but now feels that she is having some constipation.  She did have a small bowel movement this morning.  Took 1 dose of Metamucil, has not tried any laxatives.  States she is also taking fiber capsules.  States that stools have been soft.  She denies hard stools but has had some straining.  Has not had any further rectal bleeding.  Denies melena.  She denies rectal pain, but has had intermittent lower abdominal pain which is relieved with a bowel movement.  She otherwise denies abdominal pain.  Denies acid reflux/heartburn, nausea/vomiting.  Denies confusion.  She has been having some occasional dizziness but denies syncopal events.  Has noticed some swelling of her abdomen and bilateral lower extremities which is worse when she is outside in the heat.  Denies any yellowing of her skin or eyes.  Reports she is taking medications as prescribed.  Planning to have second iron infusion tomorrow.  Previous GI Procedures/Imaging   EGD 12/22/2023 (Dr. Suzann) - Normal upper third of esophagus.  -  Grade II esophageal varices.  - Portal hypertensive gastropathy.  - Normal cardia and gastric fundus.  - Gastric antral vascular ectasia without bleeding. Treated with argon plasma coagulation ( APC) .  - Source of patient' s recurrent anemia and suspected blood loss  may be due to GAVE as well as portal hypertensive duodenopathy.  EGD 11/09/2023 (Dr. Shila) - Grade II esophageal varices.  - Gastric antral vascular ectasia with bleeding. Treated with argon plasma coagulation ( APC) .  - Portal hypertensive gastropathy. - Erythematous duodenopathy.  - No specimens collected.  Abdominal ultrasound 11/08/2023 1. Cirrhotic liver with ascites and mild splenomegaly. 2. Cholelithiasis with mild gallbladder wall thickening. Negative sonographic Murphy sign. 3. Enlarged lymph node adjacent to the pancreatic head.  Colonoscopy 10/20/2020 - Preparation of the colon was fair even after copious lavage.  - Perianal erythema found on perianal exam. Hemorrhoids found on digital rectal exam.  - One 6 mm polyp in the transverse colon, removed with a cold snare. Resected and retrieved.  - Stool in the entire examined colon.  - Diverticulosis in the recto- sigmoid colon, in the sigmoid colon and in the descending colon.  - Non- bleeding non- thrombosed external and internal hemorrhoids. Path: C. COLON, TRANSVERSE, POLYPECTOMY:  - Tubular adenoma  - Negative for high-grade dysplasia or malignancy   EGD 10/20/2020 - No gross lesions in esophagus proximally. Grade II esophageal varices distally.  - Z- line regular, 40 cm from the incisors.  - Erythematous mucosa in the cardia. Portal hypertensive gastropathy throughout.  - Gastric antral vascular ectasia with bleeding. Treated with radiofrequency ablation.  - Biopsies were taken with a cold forceps for histology and Helicobacter pylori testing.  - Nodular mucosa in the duodenal bulb and in the second portion of the duodenum. Biopsied. Path: A. STOMACH, BIOPSY:  - Gastric antral mucosa showing marked nonspecific reactive gastropathy  with changes suggestive of adjacent erosion  - Gastric oxyntic mucosa with no specific histopathologic changes  - Warthin Starry stain is negative for Helicobacter pylori   B. DUODENUM,  NODULES, BIOPSY:  - Polypoid duodenal mucosa showing chronic duodenitis with extensive  surface gastric foveolar metaplasia, see comment  - Negative for dysplasia or malignancy   CT A/P 08/28/2020 1. Advanced cirrhosis of the liver. Splenomegaly. 4-5 cm hypoperfused area in the anterior superior corner of the spleen presumably relating to a splenic infarction. This could explain the clinical presentation. 2. Aortic atherosclerosis. Maximal diameter of the infrarenal abdominal aorta measures 3.2 cm. Recommend follow-up ultrasound every 3 years. This recommendation follows ACR consensus guidelines: White Paper of the ACR Incidental Findings Committee II on Vascular Findings. J Am Coll Radiol 2013; 10:789-794. 3. Nodal prominence in the pericardial station, the upper abdomen and the retroperitoneum, likely secondary to lymphatic congestion associated with cirrhosis.  Past Medical History:  Diagnosis Date   Cirrhosis (HCC)    Diabetes mellitus without complication (HCC)    Hyperlipemia    Hypertension    Neuropathy    Spleen disease     Past Surgical History:  Procedure Laterality Date   BIOPSY  10/20/2020   Procedure: BIOPSY;  Surgeon: Wilhelmenia Aloha Raddle., MD;  Location: [facility];  Service: Gastroenterology;;   COLONOSCOPY WITH PROPOFOL  N/A 10/20/2020   Procedure: COLONOSCOPY WITH PROPOFOL ;  Surgeon: Wilhelmenia Aloha Raddle., MD;  Location: [facility];  Service: Gastroenterology;  Laterality: N/A;   ESOPHAGOGASTRODUODENOSCOPY N/A 11/09/2023   Procedure: EGD (ESOPHAGOGASTRODUODENOSCOPY);  Surgeon: Nandigam, Kavitha V, MD;  Location: [facility] ENDOSCOPY;  Service: Gastroenterology;  Laterality: N/A;   ESOPHAGOGASTRODUODENOSCOPY N/A 12/22/2023   Procedure: EGD (ESOPHAGOGASTRODUODENOSCOPY);  Surgeon: Suzann Inocente HERO, MD;  Location: [facility] [place] END[facility]COPY;  Service: Gastroenterology;  Laterality: N/A;   ESOPHAGOGASTRODUODENOSCOPY (EGD) WITH PROPOFOL  N/A 10/20/2020   Procedure:  ESOPHAGOGASTRODUODENOSCOPY (EGD) WITH PROPOFOL ;  Surgeon: Wilhelmenia Aloha Raddle., MD;  Location: [facility] ENDOSCOPY;  Service: Gastroenterology;  Laterality: N/A;   HOT HEMOSTASIS N/A 11/09/2023   Procedure: EGD, WITH ARGON PLASMA COAGULATION;  Surgeon: Shila Gustav GAILS, MD;  Location: [facility] ENDOSCOPY;  Service: Gastroenterology;  Laterality: N/A;   HOT HEMOSTASIS N/A 12/22/2023   Procedure: EGD, WITH ARGON PLASMA COAGULATION;  Surgeon: Suzann Inocente HERO, MD;  Location: [facility] ENDOSCOPY;  Service: Gastroenterology;  Laterality: N/A;   INTRAOPERATIVE TRANSTHORACIC ECHOCARDIOGRAM  10/17/2020   Procedure: TRANSTHORACIC ECHOCARDIOGRAM;  Surgeon: Okey Vina GAILS, MD;  Location: [facility] ENDOSCOPY;  Service: Cardiovascular;;   IR PARACENTESIS  11/10/2023   IR THORACENTESIS ASP PLEURAL SPACE W/IMG GUIDE  11/21/2023   POLYPECTOMY  10/20/2020   Procedure: POLYPECTOMY;  Surgeon: Mansouraty, Aloha Raddle., MD;  Location: [facility] ENDOSCOPY;  Service: Gastroenterology;;   RADIOFREQUENCY ABLATION  10/20/2020   Procedure: RADIO FREQUENCY ABLATION;  Surgeon: Wilhelmenia Aloha Raddle., MD;  Location: [facility] ENDOSCOPY;  Service: Gastroenterology;;   RECONSTRUCTION OF NOSE     TRACHEOSTOMY     TRACHEOSTOMY CLOSURE      Current Outpatient Medications  Medication Sig Dispense Refill   acetaminophen  (TYLENOL ) 500 MG tablet Take 1 tablet (500 mg total) by mouth every 6 (six) hours as needed for mild pain (pain score 1-3) (or Fever >/= 101).     atorvastatin  (LIPITOR) 10 MG tablet Take 1 tablet (10 mg total) by mouth at bedtime. 90 tablet 3   BIOTIN PO Take 1 tablet by mouth daily with breakfast.     Calcium  Carb-Cholecalciferol (CALCIUM +D3 PO) Take 1 tablet by mouth daily after breakfast.     ferrous gluconate  (FERGON) 324 MG tablet Take 1 tablet (324 mg total) by mouth daily with breakfast. 90 tablet 1   furosemide  (LASIX ) 40 MG tablet Take 1 tablet (40 mg total) by mouth daily. 90 tablet 1   gabapentin  (NEURONTIN ) 400 MG capsule TAKE 1 CAPSULE  EVERY MORNING AND 2 AT BEDTIME 270 capsule 1   midodrine  (PROAMATINE ) 5 MG tablet Take 3 tablets (15 mg total) by mouth 3 (three) times daily with meals. 270 tablet 3   Omega-3 Fatty Acids (FISH OIL PO) Take 1 capsule by mouth every evening.     pantoprazole  (PROTONIX ) 40 MG tablet Take 1 tablet (40 mg total) by mouth 2 (two) times daily. 180 tablet 1   propranolol  (INDERAL ) 10 MG tablet Take 1 tablet (10 mg total) by mouth 3 (three) times daily. 90 tablet 11   sucralfate  (CARAFATE ) 1 GM/10ML suspension Take 10 mLs (1 g total) by mouth with breakfast, with lunch, and with evening meal. 414 mL 0   No current facility-administered medications for this visit.    Allergies as of 01/25/2024   (No Known Allergies)    Family History  Problem Relation Age of Onset   GER disease Mother    Heart disease Mother    Diabetes Father    Hypertension Sister    Hypertension Brother    Prostate cancer Maternal Grandfather    Hypertension Brother    Hypertension Brother    Hypertension Brother    Colon cancer Neg Hx    Esophageal cancer Neg Hx    Kidney disease Neg Hx    Liver disease Neg  Hx    Pancreatic cancer Neg Hx    Rectal cancer Neg Hx    Stomach cancer Neg Hx     Social History   Tobacco Use   Smoking status: Some Days    Current packs/day: 0.50    Average packs/day: 0.5 packs/day for 56.5 years (28.3 ttl pk-yrs)    Types: Cigarettes    Start date: 07/20/1967   Smokeless tobacco: Never   Tobacco comments:    smokes 1 per day.  Vaping Use   Vaping status: Never Used  Substance Use Topics   Alcohol use: Not Currently   Drug use: Never     Review of Systems:    Constitutional: No weight loss, fever, chills.  Positive fatigue. Skin: No rash or itching Cardiovascular: No chest pain, chest pressure or palpitations   Respiratory: No SOB or cough Gastrointestinal: See HPI and otherwise negative Genitourinary: No dysuria or change in urinary frequency Neurological: No headache,  positive dizziness. Musculoskeletal: No new muscle or joint pain Hematologic: No bruising. Resolved minimal BRBPR.    Physical Exam:  Vital signs: BP (!) 100/56 (BP Location: Left Arm, Patient Position: Sitting, Cuff Size: Normal)   Pulse 64   Ht 5' 2 (1.575 m) Comment: Patient measured without shoes  Wt 170 lb 2 oz (77.2 kg)   BMI 31.12 kg/m    Constitutional: Pleasant, obese, chronically ill-appearing female in NAD, alert and cooperative Head:  Normocephalic and atraumatic.  Eyes: No scleral icterus.  Mouth: No oral lesions. Respiratory: Respirations even and unlabored. Lungs clear to auscultation bilaterally.  No wheezes, crackles, or rhonchi.  Cardiovascular:  Regular rate and rhythm. No murmurs.  Bilateral nonpitting lower extremity edema. Gastrointestinal:  Soft, tender to palpation of RUQ. No rebound or guarding. Normal bowel sounds. No appreciable masses.  No tense ascites. Rectal:  Not performed.  Neurologic:  Alert and oriented x4;  grossly normal neurologically.  Negative asterixis. Skin:   Dry and intact without significant lesions or rashes. Psychiatric: Oriented to person, place and time. Demonstrates good judgement and reason without abnormal affect or behaviors.   RELEVANT LABS AND IMAGING: CBC    Component Value Date/Time   WBC 5.9 01/17/2024 1430   RBC 3.30 (L) 01/17/2024 1430   HGB 7.7 Repeated and verified X2. (LL) 01/17/2024 1430   HGB 13.0 02/19/2021 1426   HCT 25.7 Repeated and verified X2. (L) 01/17/2024 1430   PLT 209.0 01/17/2024 1430   PLT 141 (L) 02/19/2021 1426   MCV 77.9 (L) 01/17/2024 1430   MCH 25.0 (L) 12/22/2023 0252   MCHC 30.1 01/17/2024 1430   RDW 22.0 (H) 01/17/2024 1430   LYMPHSABS 1.8 12/26/2023 1535   MONOABS 0.9 12/26/2023 1535   EOSABS 0.0 12/26/2023 1535   BASOSABS 0.1 12/26/2023 1535    CMP     Component Value Date/Time   NA 138 01/17/2024 1430   NA 138 11/29/2023 0000   K 3.8 01/17/2024 1430   CL 101 01/17/2024 1430    CO2 32 01/17/2024 1430   GLUCOSE 125 (H) 01/17/2024 1430   BUN 11 01/17/2024 1430   BUN 1 (A) 11/29/2023 0000   CREATININE 0.54 01/17/2024 1430   CREATININE 0.89 02/19/2021 1426   CALCIUM  8.4 01/17/2024 1430   PROT 6.9 01/17/2024 1430   ALBUMIN  2.9 (L) 01/17/2024 1430   AST 27 01/17/2024 1430   AST 43 (H) 02/19/2021 1426   ALT 13 01/17/2024 1430   ALT 22 02/19/2021 1426   ALKPHOS 148 (H) 01/17/2024  1430   BILITOT 0.7 01/17/2024 1430   BILITOT 0.5 02/19/2021 1426   GFRNONAA >60 12/23/2023 0920   GFRNONAA >60 02/19/2021 1426   Echocardiogram 11/09/2023 1. Left ventricular ejection fraction, by estimation, is 60 to 65% . The left ventricle has normal function. The left ventricle has no regional wall motion abnormalities. Left ventricular diastolic parameters were normal. 2. Right ventricular systolic function is normal. The right ventricular size is normal. Tricuspid regurgitation signal is inadequate for assessing PA pressure.  3. A small pericardial effusion is present. The pericardial effusion is circumferential. Large pleural effusion in the left lateral region.  4. The mitral valve is degenerative. No evidence of mitral valve regurgitation. No evidence of mitral stenosis. Severe mitral annular calcification.  5. The aortic valve has an indeterminant number of cusps. There is moderate calcification of the aortic valve. There is moderate thickening of the aortic valve. Aortic valve regurgitation is mild. Aortic valve sclerosis/ calcification is present, without any evidence of aortic stenosis. Aortic valve area, by VTI measures 2. 13 cm . Aortic valve mean gradient measures 4. 0 mmHg. Aortic valve Vmax measures 1. 42 m/ s. 6. The inferior vena cava is dilated in size with > 50% respiratory variability, suggesting right atrial pressure of 8 mmHg.  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  Has been started on iron infusions, last hemoglobin 7.7.  Possible nontense ascites on exam today as well as RUQ abdominal pain to palpation.  Mild nonpitting lower extremity edema.  She is on Lasix  40 mg daily.  States she is taking all medications as prescribed.  She is now receiving IV iron infusions, with second infusion scheduled for tomorrow. Has not noticed any melanotic stools, though did have minimal BRBPR a few days ago following frequent bowel movements with diarrhea.  Diarrhea has since resolved, she endorses some constipation.  No further rectal bleeding. Denies any confusion and is alert and oriented in office today. She is scheduled for EGD with RFA 02/14/2024 with Dr. Wilhelmenia. MELD 15.  - Check labs today: CBC, CMP, PT/INR - Will order abdominal ultrasound to evaluate for ascites.  Pending findings consider starting spironolactone  25 mg.  Will defer starting today based on low blood pressure and will discuss further with Dr. Wilhelmenia. - 2g sodium diet - Continue protonix  40 mg BID - Continue propranolol  10 mg TID - Continue Lasix  40 mg - IV iron infusions - Refer to liver transplant clinic, MELD 15. - Will discuss further recommendations with Dr. Wilhelmenia, to include restarting ursodiol  and spironolactone .  Constipation History of adenomatous colon polyps BRBPR Patient with minimal BRBPR a few days ago  following frequent bowel movements with diarrhea.  Saw small amount of blood on the toilet paper.  Diarrhea has since resolved, she endorses some constipation.  No further rectal bleeding.  - Start Miralax  1  capful daily - Continue fiber supplement - Follow up with Dr. Wilhelmenia to discuss repeat colonoscopy   Camie Furbish, PA-C [facility] 01/25/2024, 10:07 AM  Patient Care Team: Rollene Almarie LABOR, MD as PCP - General (Internal Medicine)

## 2024-01-26 ENCOUNTER — Ambulatory Visit

## 2024-01-26 VITALS — BP 98/64 | HR 71 | Temp 98.0°F | Resp 12 | Ht 62.0 in | Wt 170.8 lb

## 2024-01-26 DIAGNOSIS — D62 Acute posthemorrhagic anemia: Secondary | ICD-10-CM | POA: Diagnosis not present

## 2024-01-26 DIAGNOSIS — K625 Hemorrhage of anus and rectum: Secondary | ICD-10-CM

## 2024-01-26 DIAGNOSIS — D500 Iron deficiency anemia secondary to blood loss (chronic): Secondary | ICD-10-CM

## 2024-01-26 MED ORDER — SODIUM CHLORIDE 0.9 % IV SOLN
510.0000 mg | Freq: Once | INTRAVENOUS | Status: AC
  Administered 2024-01-26: 510 mg via INTRAVENOUS
  Filled 2024-01-26: qty 17

## 2024-01-26 NOTE — Progress Notes (Signed)
 Diagnosis: Iron Deficiency Anemia  Provider:  Mannam, Praveen MD  Procedure: IV Infusion  IV Type: Peripheral, IV Location: R Hand  Feraheme  (Ferumoxytol ), Dose: 510 mg  Infusion Start Time: 1533  Infusion Stop Time: 1550  Post Infusion IV Care: Observation period completed and Peripheral IV Discontinued  Discharge: Condition: Stable, Destination: Home . AVS Provided  Performed by:  Rocky FORBES Sar, RN

## 2024-01-27 ENCOUNTER — Encounter: Admitting: *Deleted

## 2024-01-27 NOTE — Progress Notes (Signed)
 Attempted to reach pt x3.  Left message to call office to reschedule SDMV

## 2024-01-30 ENCOUNTER — Ambulatory Visit: Payer: Self-pay | Admitting: Gastroenterology

## 2024-01-30 ENCOUNTER — Telehealth: Payer: Self-pay | Admitting: Gastroenterology

## 2024-01-30 MED ORDER — URSODIOL 500 MG PO TABS
1000.0000 mg | ORAL_TABLET | Freq: Two times a day (BID) | ORAL | 3 refills | Status: AC
Start: 2024-01-30 — End: 2024-02-29

## 2024-01-30 NOTE — Telephone Encounter (Signed)
 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 I sent Ursodiol  1000 mg twice daily to her pharmacy. Patient verbalized understanding.

## 2024-01-30 NOTE — Progress Notes (Signed)
 Attending Physician's Attestation   I have reviewed the chart.   I agree with the Advanced Practitioner's note, impression, and recommendations with any updates as below. A lot has happened since her last evaluation in clinic in 2022.  Extensive history reviewed as you have outlined and reviewed prior consultant notes as well.  Will plan to go ahead and initiate ursodiol .  Her dosing would be approximately 1100 mg twice daily (if unable to obtain 1100 mg due to capsules then try to do 1000 mg twice daily).  Agree with upcoming endoscopy for further evaluation and potential treatment of GAVE.  Agree with referral to transplant center for evaluation considerations.   Aloha Finner, MD [facility] Advanced Endoscopy Office # [phone]

## 2024-01-30 NOTE — Telephone Encounter (Signed)
 Discussed with Dr. Wilhelmenia who suggested we restart patient on ursodiol  for her primary biliary cholangitis. Please order the following: 1100 mg twice daily (if unable to obtain 1100 mg due to capsules then try to do 1000 mg twice daily). Okay to give 3 refills. She will follow up with him in office in September.

## 2024-01-31 ENCOUNTER — Ambulatory Visit
Admission: RE | Admit: 2024-01-31 | Discharge: 2024-01-31 | Disposition: A | Discharge status: Home / Self Care | Source: Ambulatory Visit | Attending: Acute Care | Admitting: Acute Care

## 2024-01-31 DIAGNOSIS — Z122 Encounter for screening for malignant neoplasm of respiratory organs: Secondary | ICD-10-CM

## 2024-01-31 DIAGNOSIS — F17210 Nicotine dependence, cigarettes, uncomplicated: Secondary | ICD-10-CM

## 2024-01-31 DIAGNOSIS — Z87891 Personal history of nicotine dependence: Secondary | ICD-10-CM

## 2024-02-02 ENCOUNTER — Ambulatory Visit (HOSPITAL_COMMUNITY)
Admission: RE | Admit: 2024-02-02 | Discharge: 2024-02-02 | Disposition: A | Discharge status: Home / Self Care | Source: Ambulatory Visit | Attending: Gastroenterology | Admitting: Gastroenterology

## 2024-02-02 DIAGNOSIS — G8929 Other chronic pain: Secondary | ICD-10-CM | POA: Insufficient documentation

## 2024-02-02 DIAGNOSIS — R600 Localized edema: Secondary | ICD-10-CM | POA: Diagnosis not present

## 2024-02-02 DIAGNOSIS — K219 Gastro-esophageal reflux disease without esophagitis: Secondary | ICD-10-CM | POA: Insufficient documentation

## 2024-02-02 DIAGNOSIS — K7460 Unspecified cirrhosis of liver: Secondary | ICD-10-CM | POA: Diagnosis not present

## 2024-02-02 DIAGNOSIS — K766 Portal hypertension: Secondary | ICD-10-CM | POA: Insufficient documentation

## 2024-02-02 DIAGNOSIS — K7689 Other specified diseases of liver: Secondary | ICD-10-CM | POA: Diagnosis not present

## 2024-02-02 DIAGNOSIS — K3189 Other diseases of stomach and duodenum: Secondary | ICD-10-CM | POA: Diagnosis not present

## 2024-02-02 DIAGNOSIS — K743 Primary biliary cirrhosis: Secondary | ICD-10-CM | POA: Diagnosis not present

## 2024-02-02 DIAGNOSIS — K31819 Angiodysplasia of stomach and duodenum without bleeding: Secondary | ICD-10-CM | POA: Diagnosis not present

## 2024-02-02 DIAGNOSIS — I8510 Secondary esophageal varices without bleeding: Secondary | ICD-10-CM | POA: Diagnosis not present

## 2024-02-02 DIAGNOSIS — K5900 Constipation, unspecified: Secondary | ICD-10-CM | POA: Diagnosis not present

## 2024-02-02 DIAGNOSIS — R1011 Right upper quadrant pain: Secondary | ICD-10-CM | POA: Diagnosis not present

## 2024-02-02 DIAGNOSIS — K824 Cholesterolosis of gallbladder: Secondary | ICD-10-CM | POA: Diagnosis not present

## 2024-02-07 ENCOUNTER — Telehealth: Payer: Self-pay

## 2024-02-07 ENCOUNTER — Encounter (HOSPITAL_COMMUNITY): Payer: Self-pay | Admitting: Gastroenterology

## 2024-02-07 NOTE — Telephone Encounter (Signed)
 Procedure:radiofrequency ablation Procedure date: 02/14/24 Procedure location: [facility] Arrival Time: 6:30 Spoke with the patient Y/N: Y Any prep concerns? N  Has the patient obtained the prep from the pharmacy ? N Do you have a care partner and transportation: Y Any additional concerns? N

## 2024-02-07 NOTE — Progress Notes (Signed)
 Attempted to obtain medical history for pre op call via telephone, unable to reach at this time. HIPAA compliant voicemail message left requesting return call to pre surgical testing department.

## 2024-02-08 DIAGNOSIS — R601 Generalized edema: Secondary | ICD-10-CM | POA: Diagnosis not present

## 2024-02-08 DIAGNOSIS — J90 Pleural effusion, not elsewhere classified: Secondary | ICD-10-CM | POA: Diagnosis not present

## 2024-02-08 NOTE — Telephone Encounter (Signed)
 Sent to provider, nothing was signed off to close encounter

## 2024-02-13 NOTE — Anesthesia Preprocedure Evaluation (Signed)
 Anesthesia Evaluation  Patient identified by MRN, date of birth, ID band Patient awake    Reviewed: Allergy & Precautions, NPO status , Patient's Chart, lab work & pertinent test results  History of Anesthesia Complications Negative for: history of anesthetic complications  Airway Mallampati: III  TM Distance: >3 FB Neck ROM: Full    Dental  (+) Dental Advisory Given, Teeth Intact   Pulmonary shortness of breath and Long-Term Oxygen  Therapy, Current Smoker   breath sounds clear to auscultation       Cardiovascular hypertension, (-) angina (-) Past MI  Rhythm:Regular  1. Left ventricular ejection fraction, by estimation, is 60 to 65%. The  left ventricle has normal function. The left ventricle has no regional  wall motion abnormalities. Left ventricular diastolic parameters were  normal.   2. Right ventricular systolic function is normal. The right ventricular  size is normal. Tricuspid regurgitation signal is inadequate for assessing  PA pressure.   3. A small pericardial effusion is present. The pericardial effusion is  circumferential. Large pleural effusion in the left lateral region.   4. The mitral valve is degenerative. No evidence of mitral valve  regurgitation. No evidence of mitral stenosis. Severe mitral annular  calcification.   5. The aortic valve has an indeterminant number of cusps. There is  moderate calcification of the aortic valve. There is moderate thickening  of the aortic valve. Aortic valve regurgitation is mild. Aortic valve  sclerosis/calcification is present, without   any evidence of aortic stenosis. Aortic valve area, by VTI measures 2.13  cm. Aortic valve mean gradient measures 4.0 mmHg. Aortic valve Vmax  measures 1.42 m/s.   6. The inferior vena cava is dilated in size with >50% respiratory  variability, suggesting right atrial pressure of 8 mmHg.     Neuro/Psych  Neuromuscular disease     GI/Hepatic ,GERD  ,,(+) Cirrhosis         Endo/Other  diabetes    Renal/GU      Musculoskeletal negative musculoskeletal ROS (+)    Abdominal   Peds  Hematology  (+) Blood dyscrasia, anemia         Anesthesia Other Findings   Reproductive/Obstetrics                              Anesthesia Physical Anesthesia Plan  ASA: 3  Anesthesia Plan: MAC   Post-op Pain Management: Minimal or no pain anticipated   Induction: Intravenous  PONV Risk Score and Plan: 1 and Propofol  infusion and Treatment may vary due to age or medical condition  Airway Management Planned: Nasal Cannula, Natural Airway and Simple Face Mask  Additional Equipment: None  Intra-op Plan:   Post-operative Plan:   Informed Consent: I have reviewed the patients History and Physical, chart, labs and discussed the procedure including the risks, benefits and alternatives for the proposed anesthesia with the patient or authorized representative who has indicated his/her understanding and acceptance.     Dental advisory given  Plan Discussed with: CRNA and Anesthesiologist  Anesthesia Plan Comments:          Anesthesia Quick Evaluation

## 2024-02-14 ENCOUNTER — Ambulatory Visit (HOSPITAL_COMMUNITY)
Admission: RE | Admit: 2024-02-14 | Discharge: 2024-02-14 | Disposition: A | Discharge status: Home / Self Care | Attending: Gastroenterology | Admitting: Gastroenterology

## 2024-02-14 ENCOUNTER — Encounter (HOSPITAL_COMMUNITY): Payer: Self-pay | Admitting: Gastroenterology

## 2024-02-14 ENCOUNTER — Encounter (HOSPITAL_COMMUNITY)
Admission: RE | Disposition: A | Discharge status: Home / Self Care | Payer: Self-pay | Source: Home / Self Care | Attending: Gastroenterology

## 2024-02-14 ENCOUNTER — Ambulatory Visit (HOSPITAL_BASED_OUTPATIENT_CLINIC_OR_DEPARTMENT_OTHER): Payer: Self-pay | Admitting: Anesthesiology

## 2024-02-14 ENCOUNTER — Other Ambulatory Visit: Payer: Self-pay

## 2024-02-14 ENCOUNTER — Telehealth: Payer: Self-pay

## 2024-02-14 ENCOUNTER — Ambulatory Visit: Payer: Self-pay | Admitting: Gastroenterology

## 2024-02-14 ENCOUNTER — Encounter (HOSPITAL_COMMUNITY): Payer: Self-pay | Admitting: Anesthesiology

## 2024-02-14 DIAGNOSIS — I8510 Secondary esophageal varices without bleeding: Secondary | ICD-10-CM | POA: Diagnosis not present

## 2024-02-14 DIAGNOSIS — K2980 Duodenitis without bleeding: Secondary | ICD-10-CM | POA: Insufficient documentation

## 2024-02-14 DIAGNOSIS — R195 Other fecal abnormalities: Secondary | ICD-10-CM | POA: Diagnosis not present

## 2024-02-14 DIAGNOSIS — K766 Portal hypertension: Secondary | ICD-10-CM | POA: Insufficient documentation

## 2024-02-14 DIAGNOSIS — E119 Type 2 diabetes mellitus without complications: Secondary | ICD-10-CM | POA: Insufficient documentation

## 2024-02-14 DIAGNOSIS — K7460 Unspecified cirrhosis of liver: Secondary | ICD-10-CM | POA: Diagnosis not present

## 2024-02-14 DIAGNOSIS — K31811 Angiodysplasia of stomach and duodenum with bleeding: Secondary | ICD-10-CM | POA: Insufficient documentation

## 2024-02-14 DIAGNOSIS — I10 Essential (primary) hypertension: Secondary | ICD-10-CM | POA: Diagnosis not present

## 2024-02-14 DIAGNOSIS — I8500 Esophageal varices without bleeding: Secondary | ICD-10-CM | POA: Diagnosis not present

## 2024-02-14 DIAGNOSIS — E785 Hyperlipidemia, unspecified: Secondary | ICD-10-CM

## 2024-02-14 DIAGNOSIS — D500 Iron deficiency anemia secondary to blood loss (chronic): Secondary | ICD-10-CM

## 2024-02-14 DIAGNOSIS — K3189 Other diseases of stomach and duodenum: Secondary | ICD-10-CM | POA: Diagnosis not present

## 2024-02-14 DIAGNOSIS — K921 Melena: Secondary | ICD-10-CM | POA: Insufficient documentation

## 2024-02-14 DIAGNOSIS — K449 Diaphragmatic hernia without obstruction or gangrene: Secondary | ICD-10-CM | POA: Insufficient documentation

## 2024-02-14 DIAGNOSIS — F17200 Nicotine dependence, unspecified, uncomplicated: Secondary | ICD-10-CM | POA: Insufficient documentation

## 2024-02-14 DIAGNOSIS — K31819 Angiodysplasia of stomach and duodenum without bleeding: Secondary | ICD-10-CM

## 2024-02-14 DIAGNOSIS — K2950 Unspecified chronic gastritis without bleeding: Secondary | ICD-10-CM | POA: Insufficient documentation

## 2024-02-14 DIAGNOSIS — D649 Anemia, unspecified: Secondary | ICD-10-CM

## 2024-02-14 DIAGNOSIS — K2289 Other specified disease of esophagus: Secondary | ICD-10-CM

## 2024-02-14 HISTORY — PX: GI RADIOFREQUENCY ABLATION: SHX6807

## 2024-02-14 HISTORY — PX: ESOPHAGOGASTRODUODENOSCOPY: SHX5428

## 2024-02-14 LAB — IRON AND TIBC
Iron: 146 ug/dL (ref 28–170)
Saturation Ratios: 54 % — ABNORMAL HIGH (ref 10.4–31.8)
TIBC: 272 ug/dL (ref 250–450)
UIBC: 126 ug/dL

## 2024-02-14 LAB — CBC
HCT: 38 % (ref 36.0–46.0)
Hemoglobin: 11.1 g/dL — ABNORMAL LOW (ref 12.0–15.0)
MCH: 27.3 pg (ref 26.0–34.0)
MCHC: 29.2 g/dL — ABNORMAL LOW (ref 30.0–36.0)
MCV: 93.4 fL (ref 80.0–100.0)
Platelets: 151 K/uL (ref 150–400)
RBC: 4.07 MIL/uL (ref 3.87–5.11)
RDW: 19.4 % — ABNORMAL HIGH (ref 11.5–15.5)
WBC: 4.6 K/uL (ref 4.0–10.5)
nRBC: 0 % (ref 0.0–0.2)

## 2024-02-14 LAB — FERRITIN: Ferritin: 56 ng/mL (ref 11–307)

## 2024-02-14 SURGERY — EGD (ESOPHAGOGASTRODUODENOSCOPY)
Anesthesia: Monitor Anesthesia Care

## 2024-02-14 MED ORDER — DICYCLOMINE HCL 10 MG PO CAPS
10.0000 mg | ORAL_CAPSULE | Freq: Once | ORAL | Status: AC
  Administered 2024-02-14: 10 mg via ORAL
  Filled 2024-02-14: qty 1

## 2024-02-14 MED ORDER — ALUM & MAG HYDROXIDE-SIMETH 200-200-20 MG/5ML PO SUSP
30.0000 mL | Freq: Once | ORAL | Status: AC
  Administered 2024-02-14: 30 mL via ORAL
  Filled 2024-02-14: qty 30

## 2024-02-14 MED ORDER — OXYCODONE HCL 5 MG/5ML PO SOLN
5.0000 mg | Freq: Once | ORAL | Status: DC | PRN
  Filled 2024-02-14: qty 5

## 2024-02-14 MED ORDER — KETAMINE HCL 10 MG/ML IJ SOLN
INTRAMUSCULAR | Status: AC
  Filled 2024-02-14: qty 1

## 2024-02-14 MED ORDER — PROPOFOL 500 MG/50ML IV EMUL
INTRAVENOUS | Status: DC | PRN
Start: 2024-02-14 — End: 2024-02-14
  Administered 2024-02-14: 50 mg via INTRAVENOUS
  Administered 2024-02-14: 100 ug/kg/min via INTRAVENOUS

## 2024-02-14 MED ORDER — ACETYLCYSTEINE 20 % IN SOLN
RESPIRATORY_TRACT | Status: AC
  Filled 2024-02-14: qty 4

## 2024-02-14 MED ORDER — LIDOCAINE 2% (20 MG/ML) 5 ML SYRINGE
INTRAMUSCULAR | Status: DC | PRN
Start: 2024-02-14 — End: 2024-02-14
  Administered 2024-02-14: 100 mg via INTRAVENOUS

## 2024-02-14 MED ORDER — GLYCOPYRROLATE PF 0.2 MG/ML IJ SOSY
PREFILLED_SYRINGE | INTRAMUSCULAR | Status: DC | PRN
Start: 2024-02-14 — End: 2024-02-14
  Administered 2024-02-14: .2 mg via INTRAVENOUS

## 2024-02-14 MED ORDER — FENTANYL CITRATE (PF) 100 MCG/2ML IJ SOLN
INTRAMUSCULAR | Status: AC
  Filled 2024-02-14: qty 2

## 2024-02-14 MED ORDER — SODIUM CHLORIDE 0.9 % IV SOLN
INTRAVENOUS | Status: AC | PRN
  Administered 2024-02-14: 500 mL via INTRAMUSCULAR

## 2024-02-14 MED ORDER — STERILE WATER FOR INJECTION IJ SOLN
RESPIRATORY_TRACT | Status: DC | PRN
  Administered 2024-02-14: 60 mL via OROMUCOSAL

## 2024-02-14 MED ORDER — PHENYLEPHRINE 80 MCG/ML (10ML) SYRINGE FOR IV PUSH (FOR BLOOD PRESSURE SUPPORT)
PREFILLED_SYRINGE | INTRAVENOUS | Status: DC | PRN
  Administered 2024-02-14: 80 ug via INTRAVENOUS

## 2024-02-14 MED ORDER — OXYCODONE HCL 5 MG PO TABS: 5.0000 mg | ORAL_TABLET | Freq: Once | ORAL | Status: DC | PRN

## 2024-02-14 MED ORDER — KETAMINE HCL 10 MG/ML IJ SOLN
INTRAMUSCULAR | Status: DC | PRN
  Administered 2024-02-14: 20 mg via INTRAVENOUS

## 2024-02-14 MED ORDER — LACTATED RINGERS IV SOLN: INTRAVENOUS | Status: DC | PRN

## 2024-02-14 MED ORDER — SUCRALFATE 1 G PO TABS: 1.0000 g | ORAL_TABLET | Freq: Three times a day (TID) | ORAL | 3 refills | Status: DC

## 2024-02-14 MED ORDER — MEPERIDINE HCL 50 MG/ML IJ SOLN: 6.2500 mg | INTRAMUSCULAR | Status: DC | PRN

## 2024-02-14 MED ORDER — FENTANYL CITRATE (PF) 100 MCG/2ML IJ SOLN: 25.0000 ug | INTRAMUSCULAR | Status: DC | PRN

## 2024-02-14 MED ORDER — PROPOFOL 1000 MG/100ML IV EMUL
INTRAVENOUS | Status: AC
  Filled 2024-02-14: qty 100

## 2024-02-14 MED ORDER — SODIUM CHLORIDE 0.9 % IV SOLN: INTRAVENOUS | Status: DC

## 2024-02-14 MED ORDER — ONDANSETRON HCL 4 MG/2ML IJ SOLN: 4.0000 mg | Freq: Once | INTRAMUSCULAR | Status: DC | PRN

## 2024-02-14 NOTE — Telephone Encounter (Signed)
-----   Message from [facility] sent at 02/14/2024 12:23 PM EDT ----- Regarding: Follow-up EGD Sierra Logan, This patient needs a repeat EGD in next 3 to 6 weeks. This patient has need for repeat RFA treatment. Thanks. GM

## 2024-02-14 NOTE — Discharge Instructions (Signed)

## 2024-02-14 NOTE — Anesthesia Postprocedure Evaluation (Signed)
 Anesthesia Post Note  Patient: Sierra Logan  Procedure(s) Performed: EGD (ESOPHAGOGASTRODUODENOSCOPY) RADIOFREQUENCY ABLATION, UPPER GI TRACT, ENDOSCOPIC     Patient location during evaluation: PACU Anesthesia Type: MAC Level of consciousness: awake and alert Pain management: pain level controlled Vital Signs Assessment: post-procedure vital signs reviewed and stable Respiratory status: spontaneous breathing, nonlabored ventilation, respiratory function stable and patient connected to nasal cannula oxygen  Cardiovascular status: stable and blood pressure returned to baseline Postop Assessment: no apparent nausea or vomiting Anesthetic complications: no   No notable events documented.  Last Vitals:  Vitals:   02/14/24 1000 02/14/24 1010  BP: (!) 85/36 (!) 99/50  Pulse: 66 66  Resp: 14 17  Temp:    SpO2: (!) 85% (!) 89%    Last Pain:  Vitals:   02/14/24 0955  TempSrc:   PainSc: 0-No pain                 Kwame Ryland

## 2024-02-14 NOTE — Progress Notes (Signed)
 Pt educated on IS and instructed to use throughout the day.

## 2024-02-14 NOTE — H&P (Signed)
 GASTROENTEROLOGY PROCEDURE H&P NOTE   Primary Care Physician: Rollene Almarie LABOR, MD  HPI: Sierra Logan is a 68 y.o. 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  Past Medical History:  Diagnosis Date   Cirrhosis (HCC)    Diabetes mellitus without complication (HCC)    Hyperlipemia    Hypertension    Neuropathy    Spleen disease    Past Surgical History:  Procedure Laterality Date   BIOPSY  10/20/2020   Procedure: BIOPSY;  Surgeon: Wilhelmenia Aloha Raddle., MD;  Location: [facility];  Service: Gastroenterology;;   COLONOSCOPY WITH PROPOFOL  N/A 10/20/2020   Procedure: COLONOSCOPY WITH PROPOFOL ;  Surgeon: Wilhelmenia Aloha Raddle., MD;  Location: [facility];  Service: Gastroenterology;  Laterality: N/A;   ESOPHAGOGASTRODUODENOSCOPY N/A 11/09/2023   Procedure: EGD (ESOPHAGOGASTRODUODENOSCOPY);  Surgeon: Nandigam, Kavitha V, MD;  Location: [facility] END[facility]COPY;  Service: Gastroenterology;  Laterality: N/A;   ESOPHAGOGASTRODUODENOSCOPY N/A 12/22/2023   Procedure: EGD (ESOPHAGOGASTRODUODENOSCOPY);  Surgeon: Suzann Inocente HERO, MD;  Location: [facility] ENDOSCOPY;  Service: Gastroenterology;  Laterality: N/A;   ESOPHAGOGASTRODUODENOSCOPY (EGD) WITH PROPOFOL  N/A 10/20/2020   Procedure: ESOPHAGOGASTRODUODENOSCOPY (EGD) WITH PROPOFOL ;  Surgeon: Wilhelmenia Aloha Raddle., MD;  Location: [facility] END[facility]COPY;  Service: Gastroenterology;  Laterality: N/A;   HOT HEMOSTASIS N/A 11/09/2023   Procedure: EGD, WITH ARGON PLASMA COAGULATION;  Surgeon: Shila Gustav GAILS, MD;  Location: [facility];  Service: Gastroenterology;  Laterality: N/A;   HOT HEMOSTASIS N/A 12/22/2023   Procedure: EGD, WITH ARGON PLASMA COAGULATION;  Surgeon: Suzann Inocente HERO, MD;  Location: [facility] ENDOSCOPY;  Service: Gastroenterology;  Laterality: N/A;   INTRAOPERATIVE TRANSTHORACIC ECHOCARDIOGRAM  10/17/2020   Procedure: TRANSTHORACIC ECHOCARDIOGRAM;  Surgeon: Okey Vina GAILS, MD;  Location:  [facility] ENDOSCOPY;  Service: Cardiovascular;;   IR PARACENTESIS  11/10/2023   IR THORACENTESIS ASP PLEURAL SPACE W/IMG GUIDE  11/21/2023   POLYPECTOMY  10/20/2020   Procedure: POLYPECTOMY;  Surgeon: Wilhelmenia Aloha Raddle., MD;  Location: [facility];  Service: Gastroenterology;;   RADIOFREQUENCY ABLATION  10/20/2020   Procedure: RADIO FREQUENCY ABLATION;  Surgeon: Wilhelmenia Aloha Raddle., MD;  Location: [facility];  Service: Gastroenterology;;   RECONSTRUCTION OF NOSE     TRACHEOSTOMY     TRACHEOSTOMY CLOSURE     No current facility-administered medications for this encounter.   No current facility-administered medications for this encounter. No Known Allergies Family History  Problem Relation Age of Onset   GER disease Mother    Heart disease Mother    Diabetes Father    Hypertension Sister    Hypertension Brother    Prostate cancer Maternal Grandfather    Hypertension Brother    Hypertension Brother    Hypertension Brother    Colon cancer Neg Hx    Esophageal cancer Neg Hx    Kidney disease Neg Hx    Liver disease Neg Hx    Pancreatic cancer Neg Hx    Rectal cancer Neg Hx    Stomach cancer Neg Hx    Social History   Socioeconomic History   Marital status: Married    Spouse name: Not on file   Number of children: 0   Years of education: Not on file   Highest education level: Not on file  Occupational History   Occupation: part time courier  Tobacco Use   Smoking status: Some Days    Current packs/day: 0.50    Average packs/day: 0.5 packs/day for 56.6 years (28.3 ttl pk-yrs)  Types: Cigarettes    Start date: 07/20/1967   Smokeless tobacco: Never   Tobacco comments:    smokes 1 per day.  Vaping Use   Vaping status: Never Used  Substance and Sexual Activity   Alcohol use: Not Currently   Drug use: Never   Sexual activity: Not on file  Other Topics Concern   Not on file  Social History Narrative   Not on file   Social Drivers of Health   Financial Resource  Strain: Not on file  Food Insecurity: No Food Insecurity (12/21/2023)   Hunger Vital Sign    Worried About Running Out of Food in the Last Year: Never true    Ran Out of Food in the Last Year: Never true  Transportation Needs: No Transportation Needs (12/21/2023)   PRAPARE - Administrator, Civil Service (Medical): No    Lack of Transportation (Non-Medical): No  Physical Activity: Not on file  Stress: Not on file  Social Connections: Moderately Isolated (12/21/2023)   Social Connection and Isolation Panel    Frequency of Communication with Friends and Family: Once a week    Frequency of Social Gatherings with Friends and Family: Once a week    Attends Religious Services: 1 to 4 times per year    Active Member of [employer] or Organizations: No    Attends Banker Meetings: Never    Marital Status: Married  Catering manager Violence: Not At Risk (12/21/2023)   Humiliation, Afraid, Rape, and Kick questionnaire    Fear of Current or Ex-Partner: No    Emotionally Abused: No    Physically Abused: No    Sexually Abused: No    Physical Exam: There were no vitals filed for this visit. There is no height or weight on file to calculate BMI. GEN: NAD EYE: Sclerae anicteric ENT: MMM CV: Non-tachycardic GI: Soft, NT/ND NEURO:  Alert & Oriented x 3  Lab Results: No results for input(s): WBC, HGB, HCT, PLT in the last 72 hours. BMET No results for input(s): NA, K, CL, CO2, GLUCOSE, BUN, CREATININE, CALCIUM  in the last 72 hours. LFT No results for input(s): PROT, ALBUMIN , AST, ALT, ALKPHOS, BILITOT, BILIDIR, IBILI in the last 72 hours. PT/INR No results for input(s): LABPROT, INR in the last 72 hours.   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  The risks and benefits of endoscopic evaluation/treatment were discussed with the  patient and/or family; these include but are not limited to the risk of perforation, infection, bleeding, missed lesions, lack of diagnosis, severe illness requiring hospitalization, as well as anesthesia and sedation related illnesses.  The patient's history has been reviewed, patient examined, no change in status, and deemed stable for procedure.  The patient and/or family is agreeable to proceed.    Aloha Finner, MD [facility] Office # [phone]

## 2024-02-14 NOTE — Transfer of Care (Signed)
 Immediate Anesthesia Transfer of Care Note  Patient: Sierra Logan  Procedure(s) Performed: EGD (ESOPHAGOGASTRODUODENOSCOPY) RADIOFREQUENCY ABLATION, UPPER GI TRACT, ENDOSCOPIC  Patient Location: PACU and Endoscopy Unit  Anesthesia Type:MAC  Level of Consciousness: awake and alert   Airway & Oxygen  Therapy: Patient Spontanous Breathing and Patient connected to nasal cannula oxygen   Post-op Assessment: Report given to RN and Post -op Vital signs reviewed and stable  Post vital signs: Reviewed and stable  Last Vitals:  Vitals Value Taken Time  BP 103/43 02/14/24 09:12  Temp    Pulse 69 02/14/24 09:14  Resp 20 02/14/24 09:14  SpO2 93 % 02/14/24 09:14  Vitals shown include unfiled device data.  Last Pain:  Vitals:   02/14/24 0723  TempSrc: Temporal  PainSc: 0-No pain         Complications: No notable events documented.

## 2024-02-14 NOTE — Progress Notes (Signed)
 Dr gene aware of low bp.  Pt on back and fluids infusing.  Dr gene would like 1/2 liter to infuse prior to discharge.

## 2024-02-14 NOTE — Op Note (Addendum)
 [facility] Patient Name: Sierra Logan Procedure Date: 02/14/2024 MRN: [id number] Attending MD: Aloha Finner , MD, [id number] Date of Birth: 06-Aug-1955 CSN: [id number] Age: 68 Admit Type: Outpatient Procedure:                Upper GI endoscopy Indications:              Heme positive stool, Melena, Recent                            gastrointestinal bleeding, Watermelon stomach (GAVE                            syndrome) Providers:                Aloha Finner, MD, Hoy Penner, RN,                            [facility], Technician, Juliane, CRNA Referring MD:              Medicines:                Monitored Anesthesia Care Complications:            No immediate complications. Estimated Blood Loss:     Estimated blood loss was minimal. Procedure:                Pre-Anesthesia Assessment:                           - Prior to the procedure, a History and Physical                            was performed, and patient medications and                            allergies were reviewed. The patient's tolerance of                            previous anesthesia was also reviewed. The risks                            and benefits of the procedure and the sedation                            options and risks were discussed with the patient.                            All questions were answered, and informed consent                            was obtained. Prior Anticoagulants: The patient has                            taken no anticoagulant or antiplatelet agents. ASA                            Grade Assessment: III -  A patient with severe                            systemic disease. After reviewing the risks and                            benefits, the patient was deemed in satisfactory                            condition to undergo the procedure.                           After obtaining informed consent, the endoscope was                            passed  under direct vision. Throughout the                            procedure, the patient's blood pressure, pulse, and                            oxygen  saturations were monitored continuously. The                            [id number]-H190 ([id number]) Olympus endoscope was introduced                            through the mouth, and advanced to the second part                            of duodenum. The [id number]-[id number] ([id number]) Olympus                            therapeutic endoscope was introduced through the                            mouth, and advanced to the second part of duodenum.                            The upper GI endoscopy was accomplished without                            difficulty. The patient tolerated the procedure. Scope In: Scope Out: Findings:      No gross lesions were noted in the proximal esophagus.      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      The Z-line was irregular and was found 39 cm from the incisors.      A 1 cm hiatal hernia was present.      Diffuse severely friable mucosa with no bleeding was found in the cardia       (suggestive of angiectasia similar to GAVE but in different region).      Mild portal hypertensive gastropathy was found in the entire examined       stomach.  Multiple dispersed diminutive erosions with no bleeding and no stigmata       of recent bleeding were found in the entire examined stomach. Biopsies       were taken with a cold forceps for histology and Helicobacter pylori       testing.      A J-shaped deformity was found in the stomach.      A significant angulation deformity was found in the gastric antrum,       right before significant GAVE is found.      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 Focal radiofrequency ablation of gastric antral vascular       ectasia in the stomach was performed.  With the endoscope in place, the       position and extent of the abnormal mucosa and appropriate anatomic       landmarks were noted. The abnormal mucosa was irrigated with       N-acetylcysteine  (Mucomyst ) 1% mixed with water . Gastric contents were       suctioned. The radiofrequency channel ablation catheter was introduced       through the endoscope working channel. The endoscope with the ablation       catheter was advanced to the areas of abnormal mucosa. The endoscope       with the channel ablation catheter was positioned under direct       visualization so that the catheter was placed in contact with the       surface of the abnormal mucosa. Energy was applied twice at 12 J/cm2.       Ablation was repeated in a likewise fashion to all visible abnormal       mucosa. The ablation catheter was removed through the endoscope working       channel. The areas where abnormal mucosa had been ablated were examined.       Areas of abnormal mucosa appeared completely ablated. Whitish changes of       ablated mucosa were present. The total number of energy applications for       all mucosal sites treated was 82. There was no evidence for perforation.      Patchy mild inflammation characterized by erosions and erythema was       found in the duodenal bulb, in the first portion of the duodenum and in       the second portion of the duodenum. Impression:               - No gross lesions in the proximal esophagus.                           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                           - Z-line irregular, 39 cm from the incisors.                           - 1 cm hiatal hernia.                           -  Friable gastric mucosa.                           - Portal hypertensive gastropathy.                           - Erosive gastropathy with no bleeding and no                            stigmata of recent bleeding. Biopsied.                            - J-shaped stomach noted.                           - Angulation deformity in the gastric antrum.                           - Gastric antral vascular ectasia with bleeding                            found in the antrum/prepylorus. Treated with                            radiofrequency ablation.                           - Duodenitis. Moderate Sedation:      Not Applicable - Patient had care per Anesthesia. Recommendation:           - The patient will be observed post-procedure,                            until all discharge criteria are met.                           - Discharge patient to home.                           - Patient has a contact number available for                            emergencies. The signs and symptoms of potential                            delayed complications were discussed with the                            patient. Return to normal activities tomorrow.                            Written discharge instructions were provided to the                            patient.                           -  Advance diet as tolerated.                           - Continue twice daily PPI.                           - Carafate  1 g before every meal plus nightly                            (transition to tablets).                           - Observe patient's clinical course.                           - Obtain CBC/iron/TIBC/ferritin. If evidence of                            persisting iron deficiency, will require IV iron                            infusions and hematology evaluation.                           - Repeat EGD in 3 to 5 weeks for repeat RFA.                            Esophageal varices are grade 2 at this time, they                            could be considered for variceal ligation, but with                            us  working on the GAVE at this time, I would hold                            on any type of banding procedure currently.                            - Patient may require further titration of oxygen ,                            as low O2 sats were noted preprocedure. If evidence                            of significant anemia is found on blood work, this                            could be contributing.                           - The findings and recommendations were discussed  with the patient.                           - The findings and recommendations were discussed                            with the patient's family. Procedure Code(s):        --- Professional ---                           [phone], 59, Esophagogastroduodenoscopy, flexible,                            transoral; with control of bleeding, any method                           43239, Esophagogastroduodenoscopy, flexible,                            transoral; with biopsy, single or multiple Diagnosis Code(s):        --- Professional ---                           I85.00, Esophageal varices without bleeding                           K22.89, Other specified disease of esophagus                           K44.9, Diaphragmatic hernia without obstruction or                            gangrene                           K31.89, Other diseases of stomach and duodenum                           K76.6, Portal hypertension                           K31.811, Angiodysplasia of stomach and duodenum                            with bleeding                           K29.80, Duodenitis without bleeding                           R19.5, Other fecal abnormalities                           K92.1, Melena (includes Hematochezia)                           K92.2, Gastrointestinal hemorrhage, unspecified CPT copyright 2022 American Medical Association. All rights reserved. The codes documented in this report are preliminary  and upon coder review may  be revised to meet current compliance requirements. Aloha Finner, MD 02/14/2024 9:18:12 AM Number  of Addenda: 0

## 2024-02-15 ENCOUNTER — Other Ambulatory Visit: Payer: Self-pay

## 2024-02-15 DIAGNOSIS — K31819 Angiodysplasia of stomach and duodenum without bleeding: Secondary | ICD-10-CM

## 2024-02-15 NOTE — Telephone Encounter (Signed)
 Left message on machine to call back

## 2024-02-15 NOTE — Telephone Encounter (Signed)
 EGD has been set up for 03/01/24 at 4 pm at [facility] with GM

## 2024-02-16 ENCOUNTER — Encounter (HOSPITAL_COMMUNITY): Payer: Self-pay | Admitting: Gastroenterology

## 2024-02-16 ENCOUNTER — Telehealth: Payer: Self-pay

## 2024-02-16 DIAGNOSIS — Z122 Encounter for screening for malignant neoplasm of respiratory organs: Secondary | ICD-10-CM

## 2024-02-16 DIAGNOSIS — Z87891 Personal history of nicotine dependence: Secondary | ICD-10-CM

## 2024-02-16 LAB — SURGICAL PATHOLOGY

## 2024-02-16 NOTE — Addendum Note (Signed)
 Addended by: RHETT KELLY POUR on: 02/16/2024 12:44 PM   Modules accepted: Orders

## 2024-02-16 NOTE — Telephone Encounter (Signed)
 Results sent to patient via mychart. Advised pt to follow up with GI provider regarding cirrhosis and abdominal lymphadenopathy. Annual order placed for 2026. Results and plan sent to PCP.

## 2024-02-16 NOTE — Telephone Encounter (Signed)
 Results reviewed with [place], NP. Patient will complete an annual CT again next year. Result letter may be sent advising patient to continue to follow up with [facility] as planned to address liver concerns on Lung CT. Place annual CT order. Send results and plan to PCP.   IMPRESSION: 1. Lung-RADS 2, benign appearance or behavior. Continue annual screening with low-dose chest CT without contrast in 12 months. 2. Small bilateral pleural effusions with consolidative airspace disease in the dependent lung bases, probably atelectatic although superimposed pneumonia is not excluded. 3. Cirrhotic liver morphology with ascites and splenomegaly. 4. Mild lymphadenopathy in the gastrohepatic ligament measures up to 11 mm short axis. Presumably reactive. Dedicated abdomen and pelvis CT with contrast may prove helpful to further evaluate as clinically warranted. 5. Aortic Atherosclerosis (ICD10-I70.0) and Emphysema (ICD10-J43.9).

## 2024-02-16 NOTE — Telephone Encounter (Signed)
EGD scheduled, pt instructed and medications reviewed.  Patient instructions mailed to home.  Patient to call with any questions or concerns.  

## 2024-02-16 NOTE — Telephone Encounter (Signed)
 Patient returned phone call. Please advise, thank you

## 2024-02-16 NOTE — Telephone Encounter (Signed)
 Left message on machine to call back

## 2024-02-22 ENCOUNTER — Telehealth: Payer: Self-pay | Admitting: Gastroenterology

## 2024-02-22 NOTE — Telephone Encounter (Signed)
 Procedure:Endoscopy Procedure date: 03/01/24 Procedure location: [facility] Arrival Time: 2:30 pm Spoke with the patient Y/N: Yes Any prep concerns? No  Has the patient obtained the prep from the pharmacy ? No prep needed Do you have a care partner and transportation: Yes Any additional concerns? No

## 2024-02-23 ENCOUNTER — Encounter (HOSPITAL_COMMUNITY): Payer: Self-pay | Admitting: Gastroenterology

## 2024-02-23 NOTE — Progress Notes (Signed)
 Attempted to obtain medical history for pre op call via telephone, unable to reach at this time. HIPAA compliant voicemail message left requesting return call to pre surgical testing department.

## 2024-03-01 ENCOUNTER — Ambulatory Visit (HOSPITAL_COMMUNITY)
Admission: RE | Admit: 2024-03-01 | Discharge: 2024-03-01 | Disposition: A | Discharge status: Home / Self Care | Attending: Gastroenterology | Admitting: Gastroenterology

## 2024-03-01 ENCOUNTER — Telehealth: Payer: Self-pay

## 2024-03-01 ENCOUNTER — Encounter (HOSPITAL_COMMUNITY): Payer: Self-pay | Admitting: Gastroenterology

## 2024-03-01 ENCOUNTER — Other Ambulatory Visit: Payer: Self-pay

## 2024-03-01 ENCOUNTER — Ambulatory Visit (HOSPITAL_BASED_OUTPATIENT_CLINIC_OR_DEPARTMENT_OTHER): Admitting: Certified Registered Nurse Anesthetist

## 2024-03-01 ENCOUNTER — Ambulatory Visit (HOSPITAL_COMMUNITY): Admitting: Certified Registered Nurse Anesthetist

## 2024-03-01 ENCOUNTER — Encounter (HOSPITAL_COMMUNITY)
Admission: RE | Disposition: A | Discharge status: Home / Self Care | Payer: Self-pay | Source: Home / Self Care | Attending: Gastroenterology

## 2024-03-01 DIAGNOSIS — D500 Iron deficiency anemia secondary to blood loss (chronic): Secondary | ICD-10-CM

## 2024-03-01 DIAGNOSIS — F17210 Nicotine dependence, cigarettes, uncomplicated: Secondary | ICD-10-CM | POA: Diagnosis not present

## 2024-03-01 DIAGNOSIS — I10 Essential (primary) hypertension: Secondary | ICD-10-CM | POA: Diagnosis not present

## 2024-03-01 DIAGNOSIS — K259 Gastric ulcer, unspecified as acute or chronic, without hemorrhage or perforation: Secondary | ICD-10-CM

## 2024-03-01 DIAGNOSIS — I8510 Secondary esophageal varices without bleeding: Secondary | ICD-10-CM | POA: Insufficient documentation

## 2024-03-01 DIAGNOSIS — K31819 Angiodysplasia of stomach and duodenum without bleeding: Secondary | ICD-10-CM | POA: Diagnosis not present

## 2024-03-01 DIAGNOSIS — E119 Type 2 diabetes mellitus without complications: Secondary | ICD-10-CM

## 2024-03-01 DIAGNOSIS — D509 Iron deficiency anemia, unspecified: Secondary | ICD-10-CM | POA: Diagnosis not present

## 2024-03-01 DIAGNOSIS — I8500 Esophageal varices without bleeding: Secondary | ICD-10-CM

## 2024-03-01 DIAGNOSIS — K766 Portal hypertension: Secondary | ICD-10-CM | POA: Diagnosis not present

## 2024-03-01 DIAGNOSIS — K7460 Unspecified cirrhosis of liver: Secondary | ICD-10-CM

## 2024-03-01 DIAGNOSIS — K3189 Other diseases of stomach and duodenum: Secondary | ICD-10-CM

## 2024-03-01 DIAGNOSIS — Z09 Encounter for follow-up examination after completed treatment for conditions other than malignant neoplasm: Secondary | ICD-10-CM | POA: Insufficient documentation

## 2024-03-01 HISTORY — PX: ESOPHAGOGASTRODUODENOSCOPY: SHX5428

## 2024-03-01 HISTORY — PX: GI RADIOFREQUENCY ABLATION: SHX6807

## 2024-03-01 LAB — GLUCOSE, CAPILLARY: Glucose-Capillary: 83 mg/dL (ref 70–99)

## 2024-03-01 SURGERY — EGD (ESOPHAGOGASTRODUODENOSCOPY)
Anesthesia: Monitor Anesthesia Care

## 2024-03-01 MED ORDER — SODIUM CHLORIDE 0.9 % IV SOLN
INTRAVENOUS | Status: AC | PRN
  Administered 2024-03-01: 1000 mL via INTRAMUSCULAR

## 2024-03-01 MED ORDER — PROPOFOL 500 MG/50ML IV EMUL
INTRAVENOUS | Status: DC | PRN
  Administered 2024-03-01: 125 ug/kg/min via INTRAVENOUS

## 2024-03-01 MED ORDER — SUCRALFATE 1 G PO TABS: 1.0000 g | ORAL_TABLET | Freq: Three times a day (TID) | ORAL | 4 refills | Status: DC

## 2024-03-01 MED ORDER — ACETYLCYSTEINE 20 % IN SOLN
RESPIRATORY_TRACT | Status: AC
  Filled 2024-03-01: qty 4

## 2024-03-01 MED ORDER — FENTANYL CITRATE (PF) 100 MCG/2ML IJ SOLN
INTRAMUSCULAR | Status: DC | PRN
  Administered 2024-03-01: 50 ug via INTRAVENOUS

## 2024-03-01 MED ORDER — PANTOPRAZOLE SODIUM 40 MG PO TBEC: 40.0000 mg | DELAYED_RELEASE_TABLET | Freq: Two times a day (BID) | ORAL | 3 refills | Status: DC

## 2024-03-01 MED ORDER — PROPOFOL 10 MG/ML IV BOLUS
INTRAVENOUS | Status: DC | PRN
Start: 2024-03-01 — End: 2024-03-01
  Administered 2024-03-01: 30 mg via INTRAVENOUS

## 2024-03-01 MED ORDER — FENTANYL CITRATE (PF) 100 MCG/2ML IJ SOLN
INTRAMUSCULAR | Status: AC
  Filled 2024-03-01: qty 2

## 2024-03-01 MED ORDER — ACETYLCYSTEINE 20 % IN SOLN
RESPIRATORY_TRACT | Status: DC | PRN
  Administered 2024-03-01: 3 mL via ORAL

## 2024-03-01 MED ORDER — PROPOFOL 10 MG/ML IV BOLUS
INTRAVENOUS | Status: AC
Start: 2024-03-01 — End: 2024-03-01
  Filled 2024-03-01: qty 20

## 2024-03-01 MED ORDER — SODIUM CHLORIDE 0.9 % IV SOLN: INTRAVENOUS | Status: DC

## 2024-03-01 MED ORDER — PHENYLEPHRINE 80 MCG/ML (10ML) SYRINGE FOR IV PUSH (FOR BLOOD PRESSURE SUPPORT)
PREFILLED_SYRINGE | INTRAVENOUS | Status: DC | PRN
  Administered 2024-03-01 (×2): 120 ug via INTRAVENOUS

## 2024-03-01 NOTE — Transfer of Care (Signed)
 Immediate Anesthesia Transfer of Care Note  Patient: Sierra Logan  Procedure(s) Performed: EGD (ESOPHAGOGASTRODUODENOSCOPY)  Patient Location: PACU  Anesthesia Type:MAC  Level of Consciousness: drowsy  Airway & Oxygen  Therapy: Patient Spontanous Breathing and Patient connected to nasal cannula oxygen   Post-op Assessment: Report given to RN and Post -op Vital signs reviewed and stable  Post vital signs: Reviewed and stable  Last Vitals:  Vitals Value Taken Time  BP 99/51 03/01/24 16:26  Temp    Pulse 63 03/01/24 16:28  Resp 10 03/01/24 16:28  SpO2 93 % 03/01/24 16:28  Vitals shown include unfiled device data.  Last Pain:  Vitals:   03/01/24 1455  TempSrc: Temporal  PainSc: 6          Complications: No notable events documented.

## 2024-03-01 NOTE — Anesthesia Preprocedure Evaluation (Addendum)
 Anesthesia Evaluation  Patient identified by MRN, date of birth, ID band Patient awake    Reviewed: Allergy & Precautions, NPO status , Patient's Chart, lab work & pertinent test results  Airway Mallampati: II  TM Distance: >3 FB Neck ROM: Full    Dental no notable dental hx.    Pulmonary shortness of breath and Long-Term Oxygen  Therapy, Current Smoker and Patient abstained from smoking. 2L O2 at night   Pulmonary exam normal        Cardiovascular hypertension, Pt. on home beta blockers Normal cardiovascular exam     Neuro/Psych negative neurological ROS     GI/Hepatic ,GERD  Medicated and Controlled,,(+) Cirrhosis       , Hepatitis -  Endo/Other  diabetes    Renal/GU negative Renal ROS     Musculoskeletal negative musculoskeletal ROS (+)    Abdominal  (+) + obese  Peds  Hematology negative hematology ROS (+)   Anesthesia Other Findings GAVE (gastric antral vascular ectasia)  Reproductive/Obstetrics                              Anesthesia Physical Anesthesia Plan  ASA: 4  Anesthesia Plan: MAC   Post-op Pain Management:    Induction:   PONV Risk Score and Plan: 1 and Propofol  infusion and Treatment may vary due to age or medical condition  Airway Management Planned: Nasal Cannula  Additional Equipment:   Intra-op Plan:   Post-operative Plan:   Informed Consent: I have reviewed the patients History and Physical, chart, labs and discussed the procedure including the risks, benefits and alternatives for the proposed anesthesia with the patient or authorized representative who has indicated his/her understanding and acceptance.     Dental advisory given  Plan Discussed with: CRNA  Anesthesia Plan Comments:         Anesthesia Quick Evaluation

## 2024-03-01 NOTE — Op Note (Signed)
 [facility] Patient Name: Sierra Logan Procedure Date: 03/01/2024 MRN: [id number] Attending MD: Aloha Finner , MD, [id number] Date of Birth: [birth year] CSN: [id number] Age: 68 Admit Type: Outpatient Procedure:                Upper GI endoscopy Indications:              Iron deficiency anemia secondary to chronic blood                            loss, Iron deficiency anemia, Follow-up of                            esophageal varices, Watermelon stomach (GAVE                            syndrome) Providers:                Aloha Finner, MD, Robie Breed, RN,                            Lorrayne Kitty, Technician Referring MD:              Medicines:                Monitored Anesthesia Care Complications:            No immediate complications. Estimated Blood Loss:     Estimated blood loss was minimal. Procedure:                Pre-Anesthesia Assessment:                           - Prior to the procedure, a History and Physical                            was performed, and patient medications and                            allergies were reviewed. The patient's tolerance of                            previous anesthesia was also reviewed. The risks                            and benefits of the procedure and the sedation                            options and risks were discussed with the patient.                            All questions were answered, and informed consent                            was obtained. Prior Anticoagulants: The patient has                            taken no anticoagulant or  antiplatelet agents. ASA                            Grade Assessment: III - A patient with severe                            systemic disease. After reviewing the risks and                            benefits, the patient was deemed in satisfactory                            condition to undergo the procedure.                           After obtaining  informed consent, the endoscope was                            passed under direct vision. Throughout the                            procedure, the patient's blood pressure, pulse, and                            oxygen  saturations were monitored continuously. The                            GIF-H190 ([id number]) Olympus endoscope was introduced                            through the mouth, and advanced to the second part                            of duodenum. The upper GI endoscopy was                            accomplished without difficulty. The patient                            tolerated the procedure. Scope In: Scope Out: Findings:      No gross lesions were noted in the proximal esophagus and in the mid       esophagus.      Grade I, grade II varices were found in the distal esophagus. No       evidence of stigmata today.      Diffuse moderately erythematous mucosa was found in the cardia       (surrounding the GE junction) - this appears to be GAVE versus       significant portal gastropathy. Unclear, if it is safe to ablate this       area with esophageal varices noted just proximally..      Mild portal hypertensive gastropathy was found in the entire examined       stomach.      Two non-bleeding cratered gastric ulcers with a clean ulcer base       (Forrest Class  III) were found in the prepyloric region of the stomach.       The largest lesion was 16 mm in largest dimension. This is from previous       RFA session.      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 As result of the patient's anatomy, adequate       apposition with an over scope catheter was not felt to be possible.       Focal radiofrequency ablation of gastric antral vascular ectasia in the       stomach was performed. With the endoscope in place, the position and       extent of the  abnormal mucosa and appropriate anatomic landmarks were       noted. The abnormal mucosa was irrigated with N-acetylcysteine        (Mucomyst ) 1% mixed with water . Gastric contents were suctioned. The       through-the-scope radiofrequency channel ablation catheter was       introduced through the endoscope working channel. The endoscope with the       ablation catheter was advanced to the areas of abnormal mucosa. The       endoscope with the channel ablation catheter was positioned under direct       visualization so that the catheter was placed in contact with the       surface of the abnormal mucosa. Energy was applied twice at 12 J/cm2.       Ablation was repeated in a likewise fashion to all visible abnormal       mucosa. It was not clear that we would be able to get deep into the       antral deformity area to do further RFA session. With the ulcers being       present, I did not want to proceed with APC ablation to this area. The       ablation catheter was removed through the endoscope working channel. The       areas where abnormal mucosa had been ablated were examined. Areas of       abnormal mucosa appeared completely ablated to best of ability. Whitish       changes of ablated mucosa were present. The total number of energy       applications for all mucosal sites treated was 44. The endoscope was       then removed.      Patchy moderately erythematous mucosa without active bleeding and with       no stigmata of bleeding was found in the duodenal bulb, in the first       portion of the duodenum and in the second portion of the duodenum. Impression:               - No gross lesions in the proximal esophagus and in                            the mid esophagus.                           - Grade I and grade II esophageal varices noted  distally.                           - Erythematous mucosa in the cardia (just below GE                             junction?"similar in appearance to GAVE versus                            erythematous PHG).                           - Portal hypertensive gastropathy.                           - Non-bleeding gastric ulcers with a clean ulcer                            base (Forrest Class III) within the                            antrum/prepylorus (from recent RFA session 2 weeks                            ago).                           - Gastric antral vascular ectasia without bleeding.                            Treated with TTS radiofrequency ablation catheter.                           - Erythematous duodenopathy. Moderate Sedation:      Not Applicable - Patient had care per Anesthesia. Recommendation:           - The patient will be observed post-procedure,                            until all discharge criteria are met.                           - Discharge patient to home.                           - Patient has a contact number available for                            emergencies. The signs and symptoms of potential                            delayed complications were discussed with the                            patient. Return to normal activities tomorrow.  Written discharge instructions were provided to the                            patient.                           - Advance diet as tolerated.                           - Continue PPI twice daily.                           - Carafate  1 g 4 times daily for 1 month then twice                            daily.                           - Continue present medications otherwise.                           - Observe patient's clinical course.                           - Repeat upper endoscopy in 6 weeks for                            retreatment. This will allow more adequate time for                            healing purposes.                           - The findings and recommendations were discussed                             with the patient.                           - The findings and recommendations were discussed                            with the patient's family. Procedure Code(s):        --- Professional ---                           [phone], Esophagogastroduodenoscopy, flexible,                            transoral; with control of bleeding, any method Diagnosis Code(s):        --- Professional ---                           I85.00, Esophageal varices without bleeding                           K31.89, Other diseases of stomach  and duodenum                           K76.6, Portal hypertension                           K25.9, Gastric ulcer, unspecified as acute or                            chronic, without hemorrhage or perforation                           K31.819, Angiodysplasia of stomach and duodenum                            without bleeding                           D50.0, Iron deficiency anemia secondary to blood                            loss (chronic)                           D50.9, Iron deficiency anemia, unspecified CPT copyright 2022 American Medical Association. All rights reserved. The codes documented in this report are preliminary and upon coder review may  be revised to meet current compliance requirements. Aloha Finner, MD 03/01/2024 4:30:41 PM Number of Addenda: 0

## 2024-03-01 NOTE — Anesthesia Procedure Notes (Signed)
 Procedure Name: MAC Date/Time: 03/01/2024 3:41 PM  Performed by: Judythe Tanda Aran, CRNAPre-anesthesia Checklist: Patient identified, Emergency Drugs available, Suction available and Patient being monitored Patient Re-evaluated:Patient Re-evaluated prior to induction Oxygen  Delivery Method: Simple face mask

## 2024-03-01 NOTE — Discharge Instructions (Signed)

## 2024-03-01 NOTE — Telephone Encounter (Signed)
-----   Message from [facility] sent at 03/01/2024  4:30 PM EDT ----- Regarding: Followup Lashaun Poch, This patient needs repeat EGD with RFA in 6 to 8 weeks to allow healing. Please set her up for follow-up in clinic with one of the APP's in 4 to 6 weeks. Thanks. GM

## 2024-03-01 NOTE — H&P (Signed)
 GASTROENTEROLOGY PROCEDURE H&P NOTE   Primary Care Physician: Rollene Almarie LABOR, MD  HPI: Sierra Logan is a 68 y.o. female who presents for EGD for evaluation of anemia and history of GAVE and EVs in setting of Cirrhosis.  Past Medical History:  Diagnosis Date   Cirrhosis (HCC)    Diabetes mellitus without complication (HCC)    Hyperlipemia    Hypertension    Neuropathy    Spleen disease    Past Surgical History:  Procedure Laterality Date   BIOPSY  10/20/2020   Procedure: BIOPSY;  Surgeon: Wilhelmenia Aloha Raddle., MD;  Location: [facility];  Service: Gastroenterology;;   COLONOSCOPY WITH PROPOFOL  N/A 10/20/2020   Procedure: COLONOSCOPY WITH PROPOFOL ;  Surgeon: Wilhelmenia Aloha Raddle., MD;  Location: [facility];  Service: Gastroenterology;  Laterality: N/A;   ESOPHAGOGASTRODUODENOSCOPY N/A 11/09/2023   Procedure: EGD (ESOPHAGOGASTRODUODENOSCOPY);  Surgeon: Nandigam, Kavitha V, MD;  Location: [facility] ENDOSCOPY;  Service: Gastroenterology;  Laterality: N/A;   ESOPHAGOGASTRODUODENOSCOPY N/A 12/22/2023   Procedure: EGD (ESOPHAGOGASTRODUODENOSCOPY);  Surgeon: Suzann Inocente HERO, MD;  Location: [facility] ENDOSCOPY;  Service: Gastroenterology;  Laterality: N/A;   ESOPHAGOGASTRODUODENOSCOPY N/A 02/14/2024   Procedure: EGD (ESOPHAGOGASTRODUODENOSCOPY);  Surgeon: Wilhelmenia Aloha Raddle., MD;  Location: [facility];  Service: Gastroenterology;  Laterality: N/A;   ESOPHAGOGASTRODUODENOSCOPY (EGD) WITH PROPOFOL  N/A 10/20/2020   Procedure: ESOPHAGOGASTRODUODENOSCOPY (EGD) WITH PROPOFOL ;  Surgeon: Wilhelmenia Aloha Raddle., MD;  Location: [facility] ENDOSCOPY;  Service: Gastroenterology;  Laterality: N/A;   GI RADIOFREQUENCY ABLATION  02/14/2024   Procedure: RADIOFREQUENCY ABLATION, UPPER GI TRACT, ENDOSCOPIC;  Surgeon: Wilhelmenia Aloha Raddle., MD;  Location: [facility]OSCOPY;  Service: Gastroenterology;;   HOT HEMOSTASIS N/A 11/09/2023   Procedure: EGD, WITH ARGON PLASMA COAGULATION;  Surgeon: Shila Gustav GAILS, MD;  Location: [facility];  Service: Gastroenterology;  Laterality: N/A;   HOT HEMOSTASIS N/A 12/22/2023   Procedure: EGD, WITH ARGON PLASMA COAGULATION;  Surgeon: Suzann Inocente HERO, MD;  Location: [facility] ENDOSCOPY;  Service: Gastroenterology;  Laterality: N/A;   INTRAOPERATIVE TRANSTHORACIC ECHOCARDIOGRAM  10/17/2020   Procedure: TRANSTHORACIC ECHOCARDIOGRAM;  Surgeon: Okey Vina GAILS, MD;  Location: [facility] ENDOSCOPY;  Service: Cardiovascular;;   IR PARACENTESIS  11/10/2023   IR THORACENTESIS ASP PLEURAL SPACE W/IMG GUIDE  11/21/2023   POLYPECTOMY  10/20/2020   Procedure: POLYPECTOMY;  Surgeon: Mansouraty, Aloha Raddle., MD;  Location: [facility];  Service: Gastroenterology;;   RADIOFREQUENCY ABLATION  10/20/2020   Procedure: RADIO FREQUENCY ABLATION;  Surgeon: Wilhelmenia Aloha Raddle., MD;  Location: [facility];  Service: Gastroenterology;;   RECONSTRUCTION OF NOSE     TRACHEOSTOMY     TRACHEOSTOMY CLOSURE     Current Facility-Administered Medications  Medication Dose Route Frequency Provider Last Rate Last Admin   0.9 %  sodium chloride  infusion   Intravenous Continuous Mansouraty, Aloha Raddle., MD       0.9 %  sodium chloride  infusion    Continuous PRN Mansouraty, Aloha Raddle., MD 10 mL/hr at 03/01/24 1518 1,000 mL at 03/01/24 1518    Current Facility-Administered Medications:    0.9 %  sodium chloride  infusion, , Intravenous, Continuous, Mansouraty, Aloha Raddle., MD   0.9 %  sodium chloride  infusion, , , Continuous PRN, Mansouraty, Aloha Raddle., MD, Last Rate: 10 mL/hr at 03/01/24 1518, 1,000 mL at 03/01/24 1518 No Known Allergies Family History  Problem Relation Age of Onset   GER disease Mother    Heart disease Mother    Diabetes Father    Hypertension Sister    Hypertension Brother    Prostate cancer Maternal  Grandfather    Hypertension Brother    Hypertension Brother    Hypertension Brother    Colon cancer Neg Hx    Esophageal cancer Neg Hx    Kidney disease Neg Hx    Liver disease  Neg Hx    Pancreatic cancer Neg Hx    Rectal cancer Neg Hx    Stomach cancer Neg Hx    Social History   Socioeconomic History   Marital status: Married    Spouse name: Not on file   Number of children: 0   Years of education: Not on file   Highest education level: Not on file  Occupational History   Occupation: part time courier  Tobacco Use   Smoking status: Some Days    Current packs/day: 0.50    Average packs/day: 0.5 packs/day for 56.6 years (28.3 ttl pk-yrs)    Types: Cigarettes    Start date: 07/20/1967   Smokeless tobacco: Never   Tobacco comments:    smokes 1 per day.  Vaping Use   Vaping status: Never Used  Substance and Sexual Activity   Alcohol use: Not Currently   Drug use: Never   Sexual activity: Not on file  Other Topics Concern   Not on file  Social History Narrative   Not on file   Social Drivers of Health   Financial Resource Strain: Not on file  Food Insecurity: No Food Insecurity (12/21/2023)   Hunger Vital Sign    Worried About Running Out of Food in the Last Year: Never true    Ran Out of Food in the Last Year: Never true  Transportation Needs: No Transportation Needs (12/21/2023)   PRAPARE - Administrator, Civil Service (Medical): No    Lack of Transportation (Non-Medical): No  Physical Activity: Not on file  Stress: Not on file  Social Connections: Moderately Isolated (12/21/2023)   Social Connection and Isolation Panel    Frequency of Communication with Friends and Family: Once a week    Frequency of Social Gatherings with Friends and Family: Once a week    Attends Religious Services: 1 to 4 times per year    Active Member of [employer] or Organizations: No    Attends Banker Meetings: Never    Marital Status: Married  Catering manager Violence: Not At Risk (12/21/2023)   Humiliation, Afraid, Rape, and Kick questionnaire    Fear of Current or Ex-Partner: No    Emotionally Abused: No    Physically Abused: No    Sexually  Abused: No    Physical Exam: Today's Vitals   03/01/24 1455  BP: (!) 108/41  Pulse: 73  Resp: 19  Temp: 97.9 F (36.6 C)  TempSrc: Temporal  SpO2: 91%  Weight: 77.5 kg  Height: 5' 2 (1.575 m)  PainSc: 6    Body mass index is 31.25 kg/m. GEN: NAD EYE: Sclerae anicteric ENT: MMM CV: Non-tachycardic GI: Soft, NT/ND NEURO:  Alert & Oriented x 3  Lab Results: No results for input(s): WBC, HGB, HCT, PLT in the last 72 hours. BMET No results for input(s): NA, K, CL, CO2, GLUCOSE, BUN, CREATININE, CALCIUM  in the last 72 hours. LFT No results for input(s): PROT, ALBUMIN , AST, ALT, ALKPHOS, BILITOT, BILIDIR, IBILI in the last 72 hours. PT/INR No results for input(s): LABPROT, INR in the last 72 hours.   Impression / Plan: This is a 68 y.o.female who presents for EGD for evaluation of anemia and history of GAVE and EVs  in setting of Cirrhosis.  The risks and benefits of endoscopic evaluation/treatment were discussed with the patient and/or family; these include but are not limited to the risk of perforation, infection, bleeding, missed lesions, lack of diagnosis, severe illness requiring hospitalization, as well as anesthesia and sedation related illnesses.  The patient's history has been reviewed, patient examined, no change in status, and deemed stable for procedure.  The patient and/or family is agreeable to proceed.    Aloha Finner, MD [facility] Advanced [facility]oscopy Office # [phone]

## 2024-03-02 NOTE — Anesthesia Postprocedure Evaluation (Signed)
 Anesthesia Post Note  Patient: Sierra Logan  Procedure(s) Performed: EGD (ESOPHAGOGASTRODUODENOSCOPY)     Patient location during evaluation: Endoscopy Anesthesia Type: MAC Level of consciousness: awake Pain management: pain level controlled Vital Signs Assessment: post-procedure vital signs reviewed and stable Respiratory status: spontaneous breathing, nonlabored ventilation and respiratory function stable Cardiovascular status: blood pressure returned to baseline and stable Postop Assessment: no apparent nausea or vomiting Anesthetic complications: no   No notable events documented.  Last Vitals:  Vitals:   03/01/24 1635 03/01/24 1640  BP:  (!) 92/21  Pulse: 66 68  Resp: 11 12  Temp:    SpO2: 94% 93%    Last Pain:  Vitals:   03/01/24 1640  TempSrc:   PainSc: 0-No pain                 Satvik Parco P Haynes Giannotti

## 2024-03-02 NOTE — Telephone Encounter (Signed)
 Dr Wilhelmenia I see no availability until mid October. Do you see something sooner?

## 2024-03-02 NOTE — Telephone Encounter (Signed)
 You are correct that is what I see. Go ahead and schedule as able. GM

## 2024-03-06 ENCOUNTER — Encounter (HOSPITAL_COMMUNITY): Payer: Self-pay | Admitting: Gastroenterology

## 2024-03-10 DIAGNOSIS — R601 Generalized edema: Secondary | ICD-10-CM | POA: Diagnosis not present

## 2024-03-10 DIAGNOSIS — J90 Pleural effusion, not elsewhere classified: Secondary | ICD-10-CM | POA: Diagnosis not present

## 2024-03-28 ENCOUNTER — Ambulatory Visit: Admitting: Gastroenterology

## 2024-03-28 ENCOUNTER — Encounter: Payer: Self-pay | Admitting: Gastroenterology

## 2024-03-28 ENCOUNTER — Other Ambulatory Visit (INDEPENDENT_AMBULATORY_CARE_PROVIDER_SITE_OTHER)

## 2024-03-28 VITALS — BP 90/64 | HR 86 | Ht 62.0 in | Wt 178.4 lb

## 2024-03-28 DIAGNOSIS — K7460 Unspecified cirrhosis of liver: Secondary | ICD-10-CM | POA: Diagnosis not present

## 2024-03-28 DIAGNOSIS — K31819 Angiodysplasia of stomach and duodenum without bleeding: Secondary | ICD-10-CM

## 2024-03-28 DIAGNOSIS — R600 Localized edema: Secondary | ICD-10-CM

## 2024-03-28 DIAGNOSIS — K743 Primary biliary cirrhosis: Secondary | ICD-10-CM

## 2024-03-28 LAB — PROTIME-INR
INR: 1.1 ratio — ABNORMAL HIGH (ref 0.8–1.0)
Prothrombin Time: 12.1 s (ref 9.6–13.1)

## 2024-03-28 LAB — CBC
HCT: 32 % — ABNORMAL LOW (ref 36.0–46.0)
Hemoglobin: 10.4 g/dL — ABNORMAL LOW (ref 12.0–15.0)
MCHC: 32.5 g/dL (ref 30.0–36.0)
MCV: 88.4 fl (ref 78.0–100.0)
Platelets: 183 K/uL (ref 150.0–400.0)
RBC: 3.62 Mil/uL — ABNORMAL LOW (ref 3.87–5.11)
RDW: 15.7 % — ABNORMAL HIGH (ref 11.5–15.5)
WBC: 4.9 K/uL (ref 4.0–10.5)

## 2024-03-28 LAB — COMPREHENSIVE METABOLIC PANEL WITH GFR
ALT: 14 U/L (ref 0–35)
AST: 29 U/L (ref 0–37)
Albumin: 2.6 g/dL — ABNORMAL LOW (ref 3.5–5.2)
Alkaline Phosphatase: 139 U/L — ABNORMAL HIGH (ref 39–117)
BUN: 8 mg/dL (ref 6–23)
CO2: 34 meq/L — ABNORMAL HIGH (ref 19–32)
Calcium: 8.1 mg/dL — ABNORMAL LOW (ref 8.4–10.5)
Chloride: 96 meq/L (ref 96–112)
Creatinine, Ser: 0.59 mg/dL (ref 0.40–1.20)
GFR: 92.91 mL/min (ref >60.00)
Glucose, Bld: 119 mg/dL — ABNORMAL HIGH (ref 70–99)
Potassium: 3.5 meq/L (ref 3.5–5.1)
Sodium: 136 meq/L (ref 135–145)
Total Bilirubin: 0.4 mg/dL (ref 0.2–1.2)
Total Protein: 6.8 g/dL (ref 6.0–8.3)

## 2024-03-28 MED ORDER — SUCRALFATE 1 G PO TABS: 1.0000 g | ORAL_TABLET | Freq: Three times a day (TID) | ORAL | 2 refills | Status: DC

## 2024-03-28 MED ORDER — PANTOPRAZOLE SODIUM 40 MG PO TBEC: 40.0000 mg | DELAYED_RELEASE_TABLET | Freq: Two times a day (BID) | ORAL | 2 refills | Status: DC

## 2024-03-28 MED ORDER — PROPRANOLOL HCL 10 MG PO TABS: 10.0000 mg | ORAL_TABLET | Freq: Three times a day (TID) | ORAL | 2 refills | Status: AC

## 2024-03-28 NOTE — Progress Notes (Unsigned)
 GASTROENTEROLOGY OUTPATIENT CLINIC VISIT   Primary Care Provider Rollene Almarie LABOR, MD [address] [place] [address] [phone]   Patient Profile: Sierra Logan is a 68 y.o. 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  The patient presents to the [facility] for an evaluation and management of problem(s) noted below:  Problem List No diagnosis found.   History of Present Illness Please see initial consultation note for full details of HPI.  Interval History The patient returns for scheduled follow-up.  Since our last visit we underwent endoscopic evaluation from above and below.  She was found to have stable esophageal varices without stigmata as well as evidence of gastritis and GAVE which was treated with RFA therapy.  Tubular adenomas were found on colonoscopy but the preparation was inadequate for true screening purposes.  Patient has undergone repeat labs showing improvement in blood counts though iron deficiency does still persist.  Patient has felt stronger since her last procedure.  She remains on ursodiol  at this time.  She remains on PPI therapy at this time.   The patient denies any issues with jaundice, scleral icterus, generalized pruritus, darkened/amber urine, clay-colored stools, LE edema, hematemesis, coffee-ground emesis, abdominal distention, confusion.  The patient is followed up with our referral to our pulmonary colleagues and she is now off oxygen .  She is happy about this.  She is ready for the next steps in her evaluation and she just wants to remain healthy and not have any progression of her liver disease if possible.  She does have some chronic loose bowel movements that occurs at times.  No blood in her stools.  No  incontinence.  GI Review of Systems Positive as above including at times darker stool which she attributes to her iron Negative for odynophagia, dysphagia, bloating, hematochezia   Review of Systems General: Denies fevers/chills/weight loss unintentionally Cardiovascular: Denies chest pain/palpitations Pulmonary: Shortness of breath continues to improve Gastroenterological: See HPI Genitourinary: Denies darkened urine Hematological: Denies easy bruising/bleeding Dermatological: Denies jaundice Psychological: Mood is stable   Medications Current Outpatient Medications  Medication Sig Dispense Refill   acetaminophen  (TYLENOL ) 500 MG tablet Take 1 tablet (500 mg total) by mouth every 6 (six) hours as needed for mild pain (pain score 1-3) (or Fever >/= 101).     atorvastatin  (LIPITOR) 10 MG tablet Take 1 tablet (10 mg total) by mouth at bedtime. 90 tablet 3   BIOTIN PO Take 1 tablet by mouth daily with breakfast.     Calcium  Carb-Cholecalciferol (CALCIUM +D3 PO) Take 1 tablet by mouth daily after breakfast.     ferrous gluconate  (FERGON) 324 MG tablet Take 1 tablet (324 mg total) by mouth daily with breakfast. 90 tablet 1   furosemide  (LASIX ) 40 MG tablet Take 1 tablet (40 mg total) by mouth daily. 90 tablet 1   gabapentin  (NEURONTIN ) 400 MG capsule TAKE 1 CAPSULE EVERY MORNING AND 2 AT BEDTIME 270 capsule 1   midodrine  (PROAMATINE ) 5 MG tablet Take 3 tablets (15 mg total) by mouth 3 (three) times daily with meals. (Patient not taking: Reported on 01/25/2024) 270 tablet 3   Omega-3 Fatty Acids (FISH OIL PO) Take 1 capsule by mouth every evening.     pantoprazole  (PROTONIX ) 40 MG tablet Take 1 tablet (40 mg total) by mouth 2 (two) times daily before a  meal. 180 tablet 3   propranolol  (INDERAL ) 10 MG tablet Take 1 tablet (10 mg total) by mouth 3 (three) times daily. 90 tablet 11   sucralfate  (CARAFATE ) 1 g tablet Take 1 tablet (1 g total) by mouth 4 (four) times daily -  with meals and at  bedtime. 120 tablet 4   No current facility-administered medications for this visit.    Allergies No Known Allergies  Histories Past Medical History:  Diagnosis Date   Cirrhosis (HCC)    Diabetes mellitus without complication (HCC)    Hyperlipemia    Hypertension    Neuropathy    Spleen disease    Past Surgical History:  Procedure Laterality Date   BIOPSY  10/20/2020   Procedure: BIOPSY;  Surgeon: Wilhelmenia Aloha Raddle., MD;  Location: [facility];  Service: Gastroenterology;;   COLONOSCOPY WITH PROPOFOL  N/A 10/20/2020   Procedure: COLONOSCOPY WITH PROPOFOL ;  Surgeon: Wilhelmenia Aloha Raddle., MD;  Location: [facility];  Service: Gastroenterology;  Laterality: N/A;   ESOPHAGOGASTRODUODENOSCOPY N/A 11/09/2023   Procedure: EGD (ESOPHAGOGASTRODUODENOSCOPY);  Surgeon: Nandigam, Kavitha V, MD;  Location: [facility] END[facility]COPY;  Service: Gastroenterology;  Laterality: N/A;   ESOPHAGOGASTRODUODENOSCOPY N/A 12/22/2023   Procedure: EGD (ESOPHAGOGASTRODUODENOSCOPY);  Surgeon: Suzann Inocente HERO, MD;  Location: [facility] ENDOSCOPY;  Service: Gastroenterology;  Laterality: N/A;   ESOPHAGOGASTRODUODENOSCOPY N/A 02/14/2024   Procedure: EGD (ESOPHAGOGASTRODUODENOSCOPY);  Surgeon: Wilhelmenia Aloha Raddle., MD;  Location: [facility];  Service: Gastroenterology;  Laterality: N/A;   ESOPHAGOGASTRODUODENOSCOPY N/A 03/01/2024   Procedure: EGD (ESOPHAGOGASTRODUODENOSCOPY);  Surgeon: Wilhelmenia Aloha Raddle., MD;  Location: [facility];  Service: Gastroenterology;  Laterality: N/A;   ESOPHAGOGASTRODUODENOSCOPY (EGD) WITH PROPOFOL  N/A 10/20/2020   Procedure: ESOPHAGOGASTRODUODENOSCOPY (EGD) WITH PROPOFOL ;  Surgeon: Wilhelmenia Aloha Raddle., MD;  Location: [facility]COPY;  Service: Gastroenterology;  Laterality: N/A;   GI RADIOFREQUENCY ABLATION  02/14/2024   Procedure: RADIOFREQUENCY ABLATION, UPPER GI TRACT, ENDOSCOPIC;  Surgeon: Wilhelmenia, Aloha Raddle., MD;  Location: [facility];  Service: Gastroenterology;;   GI  RADIOFREQUENCY ABLATION  03/01/2024   Procedure: RADIOFREQUENCY ABLATION, UPPER GI TRACT, ENDOSCOPIC;  Surgeon: Wilhelmenia Aloha Raddle., MD;  Location: [facility];  Service: Gastroenterology;;   HOT HEMOSTASIS N/A 11/09/2023   Procedure: EGD, WITH ARGON PLASMA COAGULATION;  Surgeon: Shila Gustav GAILS, MD;  Location: [facility];  Service: Gastroenterology;  Laterality: N/A;   HOT HEMOSTASIS N/A 12/22/2023   Procedure: EGD, WITH ARGON PLASMA COAGULATION;  Surgeon: Suzann Inocente HERO, MD;  Location: [facility]COPY;  Service: Gastroenterology;  Laterality: N/A;   INTRAOPERATIVE TRANSTHORACIC ECHOCARDIOGRAM  10/17/2020   Procedure: TRANSTHORACIC ECHOCARDIOGRAM;  Surgeon: Okey Vina GAILS, MD;  Location: [facility] ENDOSCOPY;  Service: Cardiovascular;;   IR PARACENTESIS  11/10/2023   IR THORACENTESIS ASP PLEURAL SPACE W/IMG GUIDE  11/21/2023   POLYPECTOMY  10/20/2020   Procedure: POLYPECTOMY;  Surgeon: Mansouraty, Aloha Raddle., MD;  Location: [facility];  Service: Gastroenterology;;   RADIOFREQUENCY ABLATION  10/20/2020   Procedure: RADIO FREQUENCY ABLATION;  Surgeon: Wilhelmenia Aloha Raddle., MD;  Location: [facility];  Service: Gastroenterology;;   RECONSTRUCTION OF NOSE     TRACHEOSTOMY     TRACHEOSTOMY CLOSURE     Social History   Socioeconomic History   Marital status: Married    Spouse name: Not on file   Number of children: 0   Years of education: Not on file   Highest education level: Not on file  Occupational History   Occupation: part time courier  Tobacco Use   Smoking status: Some Days    Current packs/day: 0.50  Average packs/day: 0.5 packs/day for 56.7 years (28.3 ttl pk-yrs)    Types: Cigarettes    Start date: 07/20/1967   Smokeless tobacco: Never   Tobacco comments:    smokes 1 per day.  Vaping Use   Vaping status: Never Used  Substance and Sexual Activity   Alcohol use: Not Currently   Drug use: Never   Sexual activity: Not on file  Other Topics Concern   Not on file   Social History Narrative   Not on file   Social Drivers of Health   Financial Resource Strain: Not on file  Food Insecurity: No Food Insecurity (12/21/2023)   Hunger Vital Sign    Worried About Running Out of Food in the Last Year: Never true    Ran Out of Food in the Last Year: Never true  Transportation Needs: No Transportation Needs (12/21/2023)   PRAPARE - Administrator, Civil Service (Medical): No    Lack of Transportation (Non-Medical): No  Physical Activity: Not on file  Stress: Not on file  Social Connections: Moderately Isolated (12/21/2023)   Social Connection and Isolation Panel    Frequency of Communication with Friends and Family: Once a week    Frequency of Social Gatherings with Friends and Family: Once a week    Attends Religious Services: 1 to 4 times per year    Active Member of [employer] or Organizations: No    Attends Banker Meetings: Never    Marital Status: Married  Catering manager Violence: Not At Risk (12/21/2023)   Humiliation, Afraid, Rape, and Kick questionnaire    Fear of Current or Ex-Partner: No    Emotionally Abused: No    Physically Abused: No    Sexually Abused: No   Family History  Problem Relation Age of Onset   GER disease Mother    Heart disease Mother    Diabetes Father    Hypertension Sister    Hypertension Brother    Prostate cancer Maternal Grandfather    Hypertension Brother    Hypertension Brother    Hypertension Brother    Colon cancer Neg Hx    Esophageal cancer Neg Hx    Kidney disease Neg Hx    Liver disease Neg Hx    Pancreatic cancer Neg Hx    Rectal cancer Neg Hx    Stomach cancer Neg Hx    I have reviewed her medical, social, and family history in detail and updated the electronic medical record as necessary.    PHYSICAL EXAMINATION  There were no vitals taken for this visit. Wt Readings from Last 3 Encounters:  03/01/24 170 lb 13.7 oz (77.5 kg)  02/14/24 170 lb 13.7 oz (77.5 kg)  01/26/24  170 lb 12.8 oz (77.5 kg)  GEN: NAD, appears stated age, does not appear chronically ill as she has previously, nontoxic PSYCH: Cooperative, without pressured speech EYE: Conjunctivae pink, sclerae anicteric ENT: Masked CV: Nontachycardic RESP: No audible wheezing GI: NABS, soft, protuberant abdomen, nontender, no rebound or guarding MSK/EXT: Trace bilateral lower extremity edema is present SKIN: Spider angiomata noted on upper thorax; no jaundice NEURO:  Alert & Oriented x 3, no focal deficits, no evidence of asterixis   REVIEW OF DATA  I reviewed the following data at the time of this encounter:  GI Procedures and Studies  8/25 EGD - No gross lesions in the proximal esophagus and in the mid esophagus. - Grade I and grade II esophageal varices noted distally. - Erythematous  mucosa in the cardia (just below GE junction?similar in appearance to GAVE versus erythematous PHG). - Portal hypertensive gastropathy. - Non-bleeding gastric ulcers with a clean ulcer base (Forrest Class III) within the antrum/prepylorus (from recent RFA session 2 weeks ago). - Gastric antral vascular ectasia without bleeding. Treated with TTS radiofrequency ablation catheter. - Erythematous duodenopathy   Laboratory Studies  Reviewed those in epic Imaging Studies  No new imaging studies to review   ASSESSMENT  Ms. Fiebig is a 68 y.o. 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  The patient is seen today for evaluation and management of:  No diagnosis found.  The patient is hemodynamically and clinically stable at this time.  It looks like we have made some progress in her iron deficiency with her recent endoscopy and findings of GAVE status post RFA treatment.  We need to repeat this to see if any additional  therapy is required.  If the GAVE is being driven by portal hypertension then potential initiation of beta-blockade could help us  as well.  I have asked the patient to take her blood pressure and heart rates over the course of the next few weeks and then update us  with a journal log to see if we can initiate a nonselective beta-blocker or carvedilol  for primary prophylaxis against variceal bleeding versus if she has too low of a blood pressure or heart rate consideration of endoscopic band ligation therapy.  The patient will continue on her ursodiol .  We will monitor her alkaline phosphatase and liver biochemical testing.  If alkaline phosphatase does not normalize we will need to consider other medications or referral to a quaternary center for further evaluation.  It is not clear that she is a candidate for liver transplantation nor is she sure she would ever want that we will need to keep this in her mind.  The risks and benefits of endoscopic evaluation were discussed with the patient; these include but are not limited to the risk of perforation, infection, bleeding, missed lesions, lack of diagnosis, severe illness requiring hospitalization, as well as anesthesia and sedation related illnesses.  I offered the patient to undergo repeat colonoscopy with 2-day preparation at time of next EGD but she defers on this (she has a recall for within a 1 year time period).  The patient is agreeable to proceed.  All patient questions were answered to the best of my ability, and the patient agrees to the aforementioned plan of action with follow-up as indicated.   PLAN  Laboratories as outlined below Stool studies as outlined below Fecal elastase to be obtained Consider SIBO breath testing in future EGD with repeat RFA availability in coming weeks Colonoscopy within 1 year time.  For true colon cancer screening with 2-day preparation Appreciate follow-up with hematology in regards to any additional therapies or  infusions that may be required though hopefully she is stable and further RFA if necessary will help prevent her from having recurrence Check heart rate and blood pressures a few times per week and bring journal so that we may decide whether you may be candidate for beta-blockade  #ESLD Management Volume -No diuretics currently -Obtain daily standing weight -2000 mg Na diet Infection -No evidence of SBP currently Bleeding -We will see if will be able to initiate beta-blockade versus need for EVL Encephalopathy -No evidence of at this  time Screening -Up-to-date based on 2/22 CT abdomen/pelvis and will need repeat evaluation by August 2022 Transplant -Not clear that she needs or has a high enough meld or even wants at this time Vaccination -Recommended HAV and HBV vaccination/immunization Other -Promote intake of 1.5 g/kg/day of Protein (Ensures/Boosts at each meal)   No orders of the defined types were placed in this encounter.   New Prescriptions   No medications on file   Modified Medications   No medications on file    Planned Follow Up No follow-ups on file.   Total Time in Face-to-Face and in Coordination of Care for patient including independent/personal interpretation/review of prior testing, medical history, examination, medication adjustment, communicating results with the patient directly, and documentation with the EHR is 30 minutes.   Aloha Finner, MD [facility] Office # [phone]

## 2024-03-28 NOTE — Patient Instructions (Addendum)
 Your provider has requested that you go to the basement level for lab work before leaving today. Press B on the elevator. The lab is located at the first door on the left as you exit the elevator.  We have sent the following medications to your pharmacy for you to pick up at your convenience: Carafate , Pantoprazole , Propranolol   You have been scheduled for an endoscopy. Please follow written instructions given to you at your visit today.  If you use inhalers (even only as needed), please bring them with you on the day of your procedure.  If you take any of the following medications, they will need to be adjusted prior to your procedure:   DO NOT TAKE 7 DAYS PRIOR TO TEST- Trulicity (dulaglutide) Ozempic, Wegovy (semaglutide) Mounjaro (tirzepatide) Bydureon Bcise (exanatide extended release)  DO NOT TAKE 1 DAY PRIOR TO YOUR TEST Rybelsus (semaglutide) Adlyxin (lixisenatide) Victoza (liraglutide) Byetta (exanatide) ___________________________________________________________________________   Due to recent changes in healthcare laws, you may see the results of your imaging and laboratory studies on MyChart before your provider has had a chance to review them.  We understand that in some cases there may be results that are confusing or concerning to you. Not all laboratory results come back in the same time frame and the provider may be waiting for multiple results in order to interpret others.  Please give us  48 hours in order for your provider to thoroughly review all the results before contacting the office for clarification of your results.   Thank you for choosing me and [facility].  Dr. Wilhelmenia

## 2024-03-30 ENCOUNTER — Ambulatory Visit (INDEPENDENT_AMBULATORY_CARE_PROVIDER_SITE_OTHER)

## 2024-03-30 VITALS — BP 98/70 | HR 79 | Ht 62.0 in | Wt 180.0 lb

## 2024-03-30 DIAGNOSIS — Z0000 Encounter for general adult medical examination without abnormal findings: Secondary | ICD-10-CM | POA: Diagnosis not present

## 2024-03-30 DIAGNOSIS — E118 Type 2 diabetes mellitus with unspecified complications: Secondary | ICD-10-CM | POA: Diagnosis not present

## 2024-03-30 DIAGNOSIS — Z1231 Encounter for screening mammogram for malignant neoplasm of breast: Secondary | ICD-10-CM | POA: Diagnosis not present

## 2024-03-30 LAB — AFP TUMOR MARKER: AFP-Tumor Marker: 3.5 ng/mL

## 2024-03-30 NOTE — Patient Instructions (Signed)
 [name],  Thank you for taking the time for your Medicare Wellness Visit. I appreciate your continued commitment to your health goals. Please review the care plan we discussed, and feel free to reach out if I can assist you further.  Medicare recommends these wellness visits once per year to help you and your care team stay ahead of potential health issues. These visits are designed to focus on prevention, allowing your provider to concentrate on managing your acute and chronic conditions during your regular appointments.  Please note that Annual Wellness Visits do not include a physical exam. Some assessments may be limited, especially if the visit was conducted virtually. If needed, we may recommend a separate in-person follow-up with your provider.  Ongoing Care Seeing your primary care provider every 3 to 6 months helps us  monitor your health and provide consistent, personalized care. Last office on 01/17/2024.  You are due for a Flu vaccine, a Shingles vaccine and a Tetanus vaccine.  These can be given at your local pharmacy.  You will give a urine sample during your next office visit with Dr. Rollene.    Referrals If a referral was made during today's visit and you haven't received any updates within two weeks, please contact the referred provider directly to check on the status.  You have an order for:  [x]   3D Mammogram     Please call for appointment:  [facility] [address] [place], [place] [address] [phone]  Make sure to wear two-piece clothing.  No lotions, powders, or deodorants the day of the appointment. Make sure to bring picture ID and insurance card.  Bring list of medications you are currently taking including any supplements.    Recommended Screenings:  Health Maintenance  Topic Date Due   Yearly kidney health urinalysis for diabetes  Never done   DTaP/Tdap/Td vaccine (1 - Tdap) Never done   Zoster (Shingles) Vaccine (1 of 2)  Never done   Breast Cancer Screening  01/06/2017   Medicare Annual Wellness Visit  12/07/2023   Flu Shot  02/17/2024   COVID-19 Vaccine (3 - 2025-26 season) 03/19/2024   Eye exam for diabetics  03/15/2024   Hemoglobin A1C  04/27/2024   Complete foot exam   01/16/2025   Screening for Lung Cancer  01/30/2025   Yearly kidney function blood test for diabetes  03/28/2025   Colon Cancer Screening  10/21/2030   Pneumococcal Vaccine for age over 60  Completed   DEXA scan (bone density measurement)  Completed   Hepatitis C Screening  Completed   HPV Vaccine  Aged Out   Meningitis B Vaccine  Aged Out       03/30/2024   11:26 AM  Advanced Directives  Does Patient Have a Medical Advance Directive? No   Advance Care Planning is important because it: Ensures you receive medical care that aligns with your values, goals, and preferences. Provides guidance to your family and loved ones, reducing the emotional burden of decision-making during critical moments.  Vision: Annual vision screenings are recommended for early detection of glaucoma, cataracts, and diabetic retinopathy. These exams can also reveal signs of chronic conditions such as diabetes and high blood pressure.  Dental: Annual dental screenings help detect early signs of oral cancer, gum disease, and other conditions linked to overall health, including heart disease and diabetes.  Please see the attached documents for additional preventive care recommendations.

## 2024-03-30 NOTE — Progress Notes (Signed)
 Subjective:   Sierra Logan is a 68 y.o. who presents for a Medicare Wellness preventive visit.  As a reminder, Annual Wellness Visits don't include a physical exam, and some assessments may be limited, especially if this visit is performed virtually. We may recommend an in-person follow-up visit with your provider if needed.  Visit Complete: In person   Persons Participating in Visit: Patient.  AWV Questionnaire: No: Patient Medicare AWV questionnaire was not completed prior to this visit.  Cardiac Risk Factors include: advanced age (>51men, >23 women);dyslipidemia     Objective:    Today's Vitals   03/30/24 1122  BP: 98/70  Pulse: 79  SpO2: 94%  Weight: 180 lb (81.6 kg)  Height: 5' 2 (1.575 m)   Body mass index is 32.92 kg/m.     03/30/2024   11:26 AM 03/01/2024    2:53 PM 02/14/2024    7:20 AM 12/20/2023    4:26 PM 11/08/2023    2:57 AM 01/05/2022    3:44 PM 12/11/2020    8:37 AM  Advanced Directives  Does Patient Have a Medical Advance Directive? No No No No No No No  Would patient like information on creating a medical advance directive?  No - Patient declined No - Patient declined  No - Patient declined No - Patient declined Yes (MAU/Ambulatory/Procedural Areas - Information given)    Current Medications (verified) Outpatient Encounter Medications as of 03/30/2024  Medication Sig   acetaminophen  (TYLENOL ) 500 MG tablet Take 1 tablet (500 mg total) by mouth every 6 (six) hours as needed for mild pain (pain score 1-3) (or Fever >/= 101).   atorvastatin  (LIPITOR) 10 MG tablet Take 1 tablet (10 mg total) by mouth at bedtime.   BIOTIN PO Take 1 tablet by mouth daily with breakfast.   Calcium  Carb-Cholecalciferol (CALCIUM +D3 PO) Take 1 tablet by mouth daily after breakfast.   ferrous gluconate  (FERGON) 324 MG tablet Take 1 tablet (324 mg total) by mouth daily with breakfast.   furosemide  (LASIX ) 40 MG tablet Take 1 tablet (40 mg total) by mouth daily.   gabapentin   (NEURONTIN ) 400 MG capsule TAKE 1 CAPSULE EVERY MORNING AND 2 AT BEDTIME   midodrine  (PROAMATINE ) 5 MG tablet Take 3 tablets (15 mg total) by mouth 3 (three) times daily with meals.   Omega-3 Fatty Acids (FISH OIL PO) Take 1 capsule by mouth every evening.   pantoprazole  (PROTONIX ) 40 MG tablet Take 1 tablet (40 mg total) by mouth 2 (two) times daily before a meal.   propranolol  (INDERAL ) 10 MG tablet Take 1 tablet (10 mg total) by mouth 3 (three) times daily.   sucralfate  (CARAFATE ) 1 g tablet Take 1 tablet (1 g total) by mouth 4 (four) times daily -  with meals and at bedtime.   ursodiol  (ACTIGALL ) 500 MG tablet Take 2 tablets by mouth 2 (two) times daily.   No facility-administered encounter medications on file as of 03/30/2024.    Allergies (verified) Patient has no known allergies.   History: Past Medical History:  Diagnosis Date   Cirrhosis (HCC)    Diabetes mellitus without complication (HCC)    Esophageal varices (HCC)    GAVE (gastric antral vascular ectasia)    Hyperlipemia    Hypertension    IDA (iron deficiency anemia)    Neuropathy    Spleen disease    Past Surgical History:  Procedure Laterality Date   BIOPSY  10/20/2020   Procedure: BIOPSY;  Surgeon: Wilhelmenia Aloha Raddle., MD;  Location: [facility] ENDOSCOPY;  Service: Gastroenterology;;   COLONOSCOPY WITH PROPOFOL  N/A 10/20/2020   Procedure: COLONOSCOPY WITH PROPOFOL ;  Surgeon: Mansouraty, Aloha Raddle., MD;  Location: [facility];  Service: Gastroenterology;  Laterality: N/A;   ESOPHAGOGASTRODUODENOSCOPY N/A 11/09/2023   Procedure: EGD (ESOPHAGOGASTRODUODENOSCOPY);  Surgeon: Nandigam, Kavitha V, MD;  Location: [facility] ENDOSCOPY;  Service: Gastroenterology;  Laterality: N/A;   ESOPHAGOGASTRODUODENOSCOPY N/A 12/22/2023   Procedure: EGD (ESOPHAGOGASTRODUODENOSCOPY);  Surgeon: Suzann Inocente HERO, MD;  Location: [facility] ENDOSCOPY;  Service: Gastroenterology;  Laterality: N/A;   ESOPHAGOGASTRODUODENOSCOPY N/A 02/14/2024   Procedure: EGD  (ESOPHAGOGASTRODUODENOSCOPY);  Surgeon: Wilhelmenia Aloha Raddle., MD;  Location: [facility];  Service: Gastroenterology;  Laterality: N/A;   ESOPHAGOGASTRODUODENOSCOPY N/A 03/01/2024   Procedure: EGD (ESOPHAGOGASTRODUODENOSCOPY);  Surgeon: Wilhelmenia Aloha Raddle., MD;  Location: [facility];  Service: Gastroenterology;  Laterality: N/A;   ESOPHAGOGASTRODUODENOSCOPY (EGD) WITH PROPOFOL  N/A 10/20/2020   Procedure: ESOPHAGOGASTRODUODENOSCOPY (EGD) WITH PROPOFOL ;  Surgeon: Wilhelmenia Aloha Raddle., MD;  Location: [facility];  Service: Gastroenterology;  Laterality: N/A;   GI RADIOFREQUENCY ABLATION  02/14/2024   Procedure: RADIOFREQUENCY ABLATION, UPPER GI TRACT, ENDOSCOPIC;  Surgeon: Wilhelmenia, Aloha Raddle., MD;  Location: [facility] ENDOSCOPY;  Service: Gastroenterology;;   GI RADIOFREQUENCY ABLATION  03/01/2024   Procedure: RADIOFREQUENCY ABLATION, UPPER GI TRACT, ENDOSCOPIC;  Surgeon: Wilhelmenia Aloha Raddle., MD;  Location: [facility];  Service: Gastroenterology;;   HOT HEMOSTASIS N/A 11/09/2023   Procedure: EGD, WITH ARGON PLASMA COAGULATION;  Surgeon: Shila Gustav GAILS, MD;  Location: [facility] ENDOSCOPY;  Service: Gastroenterology;  Laterality: N/A;   HOT HEMOSTASIS N/A 12/22/2023   Procedure: EGD, WITH ARGON PLASMA COAGULATION;  Surgeon: Suzann Inocente HERO, MD;  Location: [facility] ENDOSCOPY;  Service: Gastroenterology;  Laterality: N/A;   INTRAOPERATIVE TRANSTHORACIC ECHOCARDIOGRAM  10/17/2020   Procedure: TRANSTHORACIC ECHOCARDIOGRAM;  Surgeon: Okey Vina GAILS, MD;  Location: [facility] ENDOSCOPY;  Service: Cardiovascular;;   IR PARACENTESIS  11/10/2023   IR THORACENTESIS ASP PLEURAL SPACE W/IMG GUIDE  11/21/2023   POLYPECTOMY  10/20/2020   Procedure: POLYPECTOMY;  Surgeon: Mansouraty, Aloha Raddle., MD;  Location: [facility] ENDOSCOPY;  Service: Gastroenterology;;   RADIOFREQUENCY ABLATION  10/20/2020   Procedure: RADIO FREQUENCY ABLATION;  Surgeon: Wilhelmenia Aloha Raddle., MD;  Location: [facility] ENDOSCOPY;  Service: Gastroenterology;;    RECONSTRUCTION OF NOSE     TRACHEOSTOMY     TRACHEOSTOMY CLOSURE     Family History  Problem Relation Age of Onset   GER disease Mother    Heart disease Mother    Diabetes Father    Hypertension Sister    Hypertension Brother    Prostate cancer Maternal Grandfather    Hypertension Brother    Hypertension Brother    Hypertension Brother    Colon cancer Neg Hx    Esophageal cancer Neg Hx    Kidney disease Neg Hx    Liver disease Neg Hx    Pancreatic cancer Neg Hx    Rectal cancer Neg Hx    Stomach cancer Neg Hx    Social History   Socioeconomic History   Marital status: Married    Spouse name: [name]   Number of children: 0   Years of education: Not on file   Highest education level: Not on file  Occupational History   Occupation: RETIRED/part time courier  Tobacco Use   Smoking status: Some Days    Current packs/day: 0.50    Average packs/day: 0.5 packs/day for 56.7 years (28.3 ttl pk-yrs)    Types: Cigarettes    Start date: 07/20/1967   Smokeless tobacco: Never  Tobacco comments:    smokes 1 per day.  Vaping Use   Vaping status: Never Used  Substance and Sexual Activity   Alcohol use: Not Currently   Drug use: Never   Sexual activity: Not on file  Other Topics Concern   Not on file  Social History Narrative   Lives with husband/2025   Social Drivers of Health   Financial Resource Strain: Low Risk  (03/30/2024)   Overall Financial Resource Strain (CARDIA)    Difficulty of Paying Living Expenses: Not very hard  Food Insecurity: No Food Insecurity (03/30/2024)   Hunger Vital Sign    Worried About Running Out of Food in the Last Year: Never true    Ran Out of Food in the Last Year: Never true  Transportation Needs: No Transportation Needs (03/30/2024)   PRAPARE - Administrator, Civil Service (Medical): No    Lack of Transportation (Non-Medical): No  Physical Activity: Inactive (03/30/2024)   Exercise Vital Sign    Days of Exercise per Week: 0  days    Minutes of Exercise per Session: 0 min  Stress: No Stress Concern Present (03/30/2024)   [employer] of Occupational Health - Occupational Stress Questionnaire    Feeling of Stress: Not at all  Social Connections: Moderately Isolated (03/30/2024)   Social Connection and Isolation Panel    Frequency of Communication with Friends and Family: Twice a week    Frequency of Social Gatherings with Friends and Family: Once a week    Attends Religious Services: Never    Database administrator or Organizations: No    Attends Engineer, structural: Never    Marital Status: Married    Tobacco Counseling Ready to quit: Not Answered Counseling given: Not Answered Tobacco comments: smokes 1 per day.    Clinical Intake:  Pre-visit preparation completed: Yes  Pain : No/denies pain     BMI - recorded: 32.92 Nutritional Status: BMI > 30  Obese Nutritional Risks: None Diabetes: No  Lab Results  Component Value Date   HGBA1C 4.8 10/27/2023   HGBA1C 5.9 12/07/2022   HGBA1C 6.5 11/09/2021     How often do you need to have someone help you when you read instructions, pamphlets, or other written materials from your doctor or pharmacy?: 1 - Never  Interpreter Needed?: No  Information entered by :: Abdurrahman Petersheim, RMA   Activities of Daily Living     03/30/2024   11:11 AM 11/08/2023    5:08 PM  In your present state of health, do you have any difficulty performing the following activities:  Hearing? 0 1  Vision? 0 0  Difficulty concentrating or making decisions? 0 0  Walking or climbing stairs? 0   Dressing or bathing? 0   Doing errands, shopping? 0 0  Comment Husband drives her around   Preparing Food and eating ? N   Using the Toilet? N   In the past six months, have you accidently leaked urine? N   Do you have problems with loss of bowel control? N   Managing your Medications? N   Managing your Finances? N   Housekeeping or managing your Housekeeping? N      Patient Care Team: Rollene Almarie LABOR, MD as PCP - [facility]  I have updated your Care Teams any recent Medical Services you may have received from other providers in the past year.     Assessment:   This is a  routine wellness examination for Suzi.  Hearing/Vision screen Hearing Screening - Comments:: Denies hearing difficulties   Vision Screening - Comments:: Had cataract surgery/   Goals Addressed               This Visit's Progress     Patient Stated (pt-stated)        Not at this time/2025       Depression Screen     03/30/2024   11:30 AM 01/17/2024    2:07 PM 10/27/2023    1:22 PM 12/07/2022    2:35 PM 01/05/2022    3:43 PM 11/09/2021    3:54 PM 11/19/2020   10:51 AM  PHQ 2/9 Scores  PHQ - 2 Score 0 0 0 0 0 0 0  PHQ- 9 Score 2   6       Fall Risk     03/30/2024   11:28 AM 01/17/2024    2:07 PM 10/27/2023    1:22 PM 12/07/2022    2:35 PM 01/05/2022    3:43 PM  Fall Risk   Falls in the past year? 0 0 1 1 0  Number falls in past yr: 0 0 0 0   Injury with Fall? 0 0 0 0   Risk for fall due to : Impaired balance/gait  No Fall Risks    Follow up Falls evaluation completed;Falls prevention discussed Falls evaluation completed Falls evaluation completed Falls evaluation completed     MEDICARE RISK AT HOME:  Medicare Risk at Home Any stairs in or around the home?: No If so, are there any without handrails?: No Home free of loose throw rugs in walkways, pet beds, electrical cords, etc?: Yes Adequate lighting in your home to reduce risk of falls?: Yes Life alert?: No Use of a cane, walker or w/c?: No Grab bars in the bathroom?: No Shower chair or bench in shower?: Yes Elevated toilet seat or a handicapped toilet?: No  TIMED UP AND GO:  Was the test performed?  Yes  Length of time to ambulate 10 feet: 35 sec Gait slow and steady without use of assistive device  Cognitive Function: Declined/Normal: No cognitive  concerns noted by patient or family. Patient alert, oriented, able to answer questions appropriately and recall recent events. No signs of memory loss or confusion.        Immunizations Immunization History  Administered Date(s) Administered   Influenza,inj,Quad PF,6+ Mos 04/07/2021   Influenza,inj,Quad PF,6-35 Mos 05/02/2019   PFIZER(Purple Top)SARS-COV-2 Vaccination 10/23/2019, 11/20/2019   PNEUMOCOCCAL CONJUGATE-20 11/09/2021    Screening Tests Health Maintenance  Topic Date Due   Diabetic kidney evaluation - Urine ACR  Never done   DTaP/Tdap/Td (1 - Tdap) Never done   Zoster Vaccines- Shingrix (1 of 2) Never done   Mammogram  01/06/2017   Influenza Vaccine  02/17/2024   COVID-19 Vaccine (3 - 2025-26 season) 03/19/2024   OPHTHALMOLOGY EXAM  03/15/2024   HEMOGLOBIN A1C  04/27/2024   FOOT EXAM  01/16/2025   Lung Cancer Screening  01/30/2025   Diabetic kidney evaluation - eGFR measurement  03/28/2025   Medicare Annual Wellness (AWV)  03/30/2025   Colonoscopy  10/21/2030   Pneumococcal Vaccine: 50+ Years  Completed   DEXA SCAN  Completed   Hepatitis C Screening  Completed   HPV VACCINES  Aged Out   Meningococcal B Vaccine  Aged Out    Health Maintenance Items Addressed: Mammogram ordered, Labs Ordered: UACR, See Nurse Notes at the end of this note  Additional Screening:  Vision Screening: Recommended annual ophthalmology exams for early detection of glaucoma and other disorders of the eye. Is the patient up to date with their annual eye exam?  No  Who is the provider or what is the name of the office in which the patient attends annual eye exams? [facility]  Dental Screening: Recommended annual dental exams for proper oral hygiene  Community Resource Referral / Chronic Care Management: CRR required this visit?  No   CCM required this visit?  No   Plan:    I have personally reviewed and noted the following in the patient's chart:   Medical and  social history Use of alcohol, tobacco or illicit drugs  Current medications and supplements including opioid prescriptions. Patient is not currently taking opioid prescriptions. Functional ability and status Nutritional status Physical activity Advanced directives List of other physicians Hospitalizations, surgeries, and ER visits in previous 12 months Vitals Screenings to include cognitive, depression, and falls Referrals and appointments  In addition, I have reviewed and discussed with patient certain preventive protocols, quality metrics, and best practice recommendations. A written personalized care plan for preventive services as well as general preventive health recommendations were provided to patient.   Zarian Colpitts L Albirtha Grinage, CMA   03/30/2024   After Visit Summary: (In Person-Printed) AVS printed and given to the patient  Notes: Patient is due for a mammogram and order has been placed today.  She is due for a UACR and that order has been placed today as well.  Patient is due for a Tdap and a Shingrix vaccine and a Flu vaccine.  She stated that she has had an eye exam and I have sent a request for her record out today.

## 2024-03-31 ENCOUNTER — Encounter: Payer: Self-pay | Admitting: Gastroenterology

## 2024-03-31 DIAGNOSIS — R600 Localized edema: Secondary | ICD-10-CM | POA: Insufficient documentation

## 2024-04-02 ENCOUNTER — Ambulatory Visit: Payer: Self-pay | Admitting: Gastroenterology

## 2024-04-05 ENCOUNTER — Other Ambulatory Visit: Payer: Self-pay

## 2024-04-05 DIAGNOSIS — K743 Primary biliary cirrhosis: Secondary | ICD-10-CM

## 2024-04-05 DIAGNOSIS — D509 Iron deficiency anemia, unspecified: Secondary | ICD-10-CM

## 2024-04-05 DIAGNOSIS — K3189 Other diseases of stomach and duodenum: Secondary | ICD-10-CM

## 2024-04-05 DIAGNOSIS — K31819 Angiodysplasia of stomach and duodenum without bleeding: Secondary | ICD-10-CM

## 2024-04-10 DIAGNOSIS — R601 Generalized edema: Secondary | ICD-10-CM | POA: Diagnosis not present

## 2024-04-10 DIAGNOSIS — J90 Pleural effusion, not elsewhere classified: Secondary | ICD-10-CM | POA: Diagnosis not present

## 2024-04-17 ENCOUNTER — Telehealth: Payer: Self-pay | Admitting: Gastroenterology

## 2024-04-17 DIAGNOSIS — K743 Primary biliary cirrhosis: Secondary | ICD-10-CM

## 2024-04-17 MED ORDER — SPIRONOLACTONE 50 MG PO TABS: 50.0000 mg | ORAL_TABLET | Freq: Every day | ORAL | 6 refills | Status: AC

## 2024-04-17 NOTE — Telephone Encounter (Signed)
 Prescription has been sent and lab order has been entered   Left message on machine to call back

## 2024-04-17 NOTE — Telephone Encounter (Signed)
 Received a call from patient regarding her body weight and lab work results. Requesting nurse fu call to discuss. Please review and advise  Thank you

## 2024-04-17 NOTE — Telephone Encounter (Signed)
 The pt states she was told to call and update her weight gain. She was 178 pounds when seen on 9/10 and is now 185 lbs today. Please advise

## 2024-04-17 NOTE — Telephone Encounter (Signed)
 I would like to add Sprinolactone 50 mg daily (30/6). She should take it at same time as Furosemide  for optimal therapeutic effect. She should have a BMP done 1-week after initiation of the Spironolactone  to ensure kidney function is able to be maintained. Thank you. GM

## 2024-04-17 NOTE — Telephone Encounter (Signed)
 The pt has been advised of the recommendations and will pick up prescription today. She will have labs in 1 week. She knows to take at the same time as furosemide .

## 2024-04-20 NOTE — Telephone Encounter (Signed)
 Error

## 2024-04-26 NOTE — Progress Notes (Signed)
 This encounter was created in error - please disregard.

## 2024-04-30 ENCOUNTER — Other Ambulatory Visit

## 2024-04-30 DIAGNOSIS — K766 Portal hypertension: Secondary | ICD-10-CM

## 2024-04-30 DIAGNOSIS — K31819 Angiodysplasia of stomach and duodenum without bleeding: Secondary | ICD-10-CM

## 2024-04-30 DIAGNOSIS — K3189 Other diseases of stomach and duodenum: Secondary | ICD-10-CM

## 2024-04-30 DIAGNOSIS — D509 Iron deficiency anemia, unspecified: Secondary | ICD-10-CM | POA: Diagnosis not present

## 2024-04-30 DIAGNOSIS — K743 Primary biliary cirrhosis: Secondary | ICD-10-CM

## 2024-04-30 LAB — COMPREHENSIVE METABOLIC PANEL WITH GFR
ALT: 12 U/L (ref 0–35)
AST: 24 U/L (ref 0–37)
Albumin: 2.4 g/dL — ABNORMAL LOW (ref 3.5–5.2)
Alkaline Phosphatase: 125 U/L — ABNORMAL HIGH (ref 39–117)
BUN: 8 mg/dL (ref 6–23)
CO2: 35 meq/L — ABNORMAL HIGH (ref 19–32)
Calcium: 8.1 mg/dL — ABNORMAL LOW (ref 8.4–10.5)
Chloride: 95 meq/L — ABNORMAL LOW (ref 96–112)
Creatinine, Ser: 0.52 mg/dL (ref 0.40–1.20)
GFR: 95.72 mL/min (ref >60.00)
Glucose, Bld: 88 mg/dL (ref 70–99)
Potassium: 3.9 meq/L (ref 3.5–5.1)
Sodium: 135 meq/L (ref 135–145)
Total Bilirubin: 0.5 mg/dL (ref 0.2–1.2)
Total Protein: 6.9 g/dL (ref 6.0–8.3)

## 2024-04-30 LAB — PROTIME-INR
INR: 1.3 ratio — ABNORMAL HIGH (ref 0.8–1.0)
Prothrombin Time: 13.6 s — ABNORMAL HIGH (ref 9.6–13.1)

## 2024-04-30 LAB — CBC
HCT: 28.4 % — ABNORMAL LOW (ref 36.0–46.0)
Hemoglobin: 9 g/dL — ABNORMAL LOW (ref 12.0–15.0)
MCHC: 31.7 g/dL (ref 30.0–36.0)
MCV: 84.8 fl (ref 78.0–100.0)
Platelets: 199 K/uL (ref 150.0–400.0)
RBC: 3.35 Mil/uL — ABNORMAL LOW (ref 3.87–5.11)
RDW: 14.9 % (ref 11.5–15.5)
WBC: 4.8 K/uL (ref 4.0–10.5)

## 2024-05-01 DIAGNOSIS — K743 Primary biliary cirrhosis: Secondary | ICD-10-CM | POA: Diagnosis not present

## 2024-05-01 DIAGNOSIS — R188 Other ascites: Secondary | ICD-10-CM | POA: Diagnosis not present

## 2024-05-01 DIAGNOSIS — I8500 Esophageal varices without bleeding: Secondary | ICD-10-CM | POA: Diagnosis not present

## 2024-05-01 DIAGNOSIS — K7460 Unspecified cirrhosis of liver: Secondary | ICD-10-CM | POA: Diagnosis not present

## 2024-05-02 ENCOUNTER — Other Ambulatory Visit: Payer: Self-pay | Admitting: Internal Medicine

## 2024-05-02 ENCOUNTER — Ambulatory Visit: Payer: Self-pay | Admitting: Gastroenterology

## 2024-05-02 DIAGNOSIS — K7460 Unspecified cirrhosis of liver: Secondary | ICD-10-CM

## 2024-05-02 DIAGNOSIS — I8500 Esophageal varices without bleeding: Secondary | ICD-10-CM

## 2024-05-02 DIAGNOSIS — K743 Primary biliary cirrhosis: Secondary | ICD-10-CM

## 2024-05-02 LAB — AFP TUMOR MARKER: AFP-Tumor Marker: 2.7 ng/mL

## 2024-05-03 ENCOUNTER — Other Ambulatory Visit: Payer: Self-pay

## 2024-05-03 DIAGNOSIS — D509 Iron deficiency anemia, unspecified: Secondary | ICD-10-CM

## 2024-05-09 ENCOUNTER — Encounter (HOSPITAL_COMMUNITY): Payer: Self-pay | Admitting: Gastroenterology

## 2024-05-09 NOTE — Progress Notes (Signed)
 Attempted to obtain medical history for pre op call via telephone, unable to reach at this time. HIPAA compliant voicemail message left requesting return call to pre surgical testing department.

## 2024-05-10 DIAGNOSIS — R601 Generalized edema: Secondary | ICD-10-CM | POA: Diagnosis not present

## 2024-05-10 DIAGNOSIS — J90 Pleural effusion, not elsewhere classified: Secondary | ICD-10-CM | POA: Diagnosis not present

## 2024-05-14 ENCOUNTER — Telehealth: Payer: Self-pay

## 2024-05-14 NOTE — Telephone Encounter (Signed)
 Thanks for letting us  know.  Patty, She usually shows up, so if you could just try again on Tuesday that would be great. Thanks. GM

## 2024-05-14 NOTE — Telephone Encounter (Signed)
 Procedure:EGD Procedure date: 05/16/24 Procedure location: [facility] Arrival Time: 6:30 Spoke with the patient Y/N: N Any prep concerns? N  Has the patient obtained the prep from the pharmacy ? N Do you have a care partner and transportation: N Any additional concerns? N   I called patient on 3 different occasions. I left a detailed message about the procedure and the office number in case patient has questions are concerns

## 2024-05-15 NOTE — Anesthesia Preprocedure Evaluation (Addendum)
 Anesthesia Evaluation  Patient identified by MRN, date of birth, ID band Patient awake    Reviewed: Allergy & Precautions, NPO status , Patient's Chart, lab work & pertinent test results, reviewed documented beta blocker date and time   Airway Mallampati: III  TM Distance: >3 FB Neck ROM: Full    Dental  (+) Teeth Intact, Dental Advisory Given   Pulmonary Current Smoker (1 cigg this AM)Patient did not abstain from smoking. 10cigg/d No inhalers   Pulmonary exam normal breath sounds clear to auscultation       Cardiovascular hypertension (98/46, normally runs low- took midodrine  today), Pt. on home beta blockers Normal cardiovascular exam+ Valvular Problems/Murmurs (mild AI) AI  Rhythm:Regular Rate:Normal  Echo 10/2023  1. Left ventricular ejection fraction, by estimation, is 60 to 65%. The  left ventricle has normal function. The left ventricle has no regional  wall motion abnormalities. Left ventricular diastolic parameters were  normal.   2. Right ventricular systolic function is normal. The right ventricular  size is normal. Tricuspid regurgitation signal is inadequate for assessing  PA pressure.   3. A small pericardial effusion is present. The pericardial effusion is  circumferential. Large pleural effusion in the left lateral region.   4. The mitral valve is degenerative. No evidence of mitral valve  regurgitation. No evidence of mitral stenosis. Severe mitral annular  calcification.   5. The aortic valve has an indeterminant number of cusps. There is  moderate calcification of the aortic valve. There is moderate thickening  of the aortic valve. Aortic valve regurgitation is mild. Aortic valve  sclerosis/calcification is present, without   any evidence of aortic stenosis. Aortic valve area, by VTI measures 2.13  cm. Aortic valve mean gradient measures 4.0 mmHg. Aortic valve Vmax  measures 1.42 m/s.   6. The inferior vena  cava is dilated in size with >50% respiratory  variability, suggesting right atrial pressure of 8 mmHg.      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)   Endo/Other  diabetes    Renal/GU negative Renal ROS  negative genitourinary   Musculoskeletal negative musculoskeletal ROS (+)    Abdominal   Peds  Hematology  (+) Blood dyscrasia, anemia   Anesthesia Other Findings   Reproductive/Obstetrics negative OB ROS                              Anesthesia Physical Anesthesia Plan  ASA: 4  Anesthesia Plan: MAC   Post-op Pain Management:    Induction:   PONV Risk Score and Plan: 2 and Propofol  infusion and TIVA  Airway Management Planned: Natural Airway and Simple Face Mask  Additional Equipment: None  Intra-op Plan:   Post-operative Plan:   Informed Consent: I have reviewed the patients History and Physical, chart, labs and discussed the procedure including the risks, benefits and alternatives for the proposed anesthesia with the patient or authorized representative who has indicated his/her understanding and acceptance.       Plan Discussed with: CRNA  Anesthesia Plan Comments: (Last EGD 02/2024 under MAC, no issues.)         Anesthesia Quick Evaluation

## 2024-05-16 ENCOUNTER — Other Ambulatory Visit: Payer: Self-pay

## 2024-05-16 ENCOUNTER — Ambulatory Visit (HOSPITAL_COMMUNITY): Payer: Self-pay | Admitting: Anesthesiology

## 2024-05-16 ENCOUNTER — Ambulatory Visit (HOSPITAL_COMMUNITY)
Admission: RE | Admit: 2024-05-16 | Discharge: 2024-05-16 | Disposition: A | Discharge status: Home / Self Care | Attending: Gastroenterology | Admitting: Gastroenterology

## 2024-05-16 ENCOUNTER — Telehealth: Payer: Self-pay

## 2024-05-16 ENCOUNTER — Encounter (HOSPITAL_COMMUNITY)
Admission: RE | Disposition: A | Discharge status: Home / Self Care | Payer: Self-pay | Source: Home / Self Care | Attending: Gastroenterology

## 2024-05-16 ENCOUNTER — Encounter (HOSPITAL_COMMUNITY): Payer: Self-pay | Admitting: Gastroenterology

## 2024-05-16 DIAGNOSIS — K743 Primary biliary cirrhosis: Secondary | ICD-10-CM

## 2024-05-16 DIAGNOSIS — I8510 Secondary esophageal varices without bleeding: Secondary | ICD-10-CM

## 2024-05-16 DIAGNOSIS — I10 Essential (primary) hypertension: Secondary | ICD-10-CM

## 2024-05-16 DIAGNOSIS — K31819 Angiodysplasia of stomach and duodenum without bleeding: Secondary | ICD-10-CM

## 2024-05-16 DIAGNOSIS — K2289 Other specified disease of esophagus: Secondary | ICD-10-CM

## 2024-05-16 DIAGNOSIS — I8500 Esophageal varices without bleeding: Secondary | ICD-10-CM | POA: Diagnosis not present

## 2024-05-16 DIAGNOSIS — F17210 Nicotine dependence, cigarettes, uncomplicated: Secondary | ICD-10-CM | POA: Diagnosis not present

## 2024-05-16 DIAGNOSIS — K2980 Duodenitis without bleeding: Secondary | ICD-10-CM

## 2024-05-16 DIAGNOSIS — K766 Portal hypertension: Secondary | ICD-10-CM

## 2024-05-16 DIAGNOSIS — K31811 Angiodysplasia of stomach and duodenum with bleeding: Secondary | ICD-10-CM

## 2024-05-16 DIAGNOSIS — D509 Iron deficiency anemia, unspecified: Secondary | ICD-10-CM

## 2024-05-16 DIAGNOSIS — D500 Iron deficiency anemia secondary to blood loss (chronic): Secondary | ICD-10-CM | POA: Diagnosis not present

## 2024-05-16 DIAGNOSIS — K3189 Other diseases of stomach and duodenum: Secondary | ICD-10-CM | POA: Diagnosis not present

## 2024-05-16 DIAGNOSIS — D62 Acute posthemorrhagic anemia: Secondary | ICD-10-CM

## 2024-05-16 HISTORY — PX: ESOPHAGOGASTRODUODENOSCOPY: SHX5428

## 2024-05-16 HISTORY — PX: GI RADIOFREQUENCY ABLATION: SHX6807

## 2024-05-16 SURGERY — EGD (ESOPHAGOGASTRODUODENOSCOPY)
Anesthesia: Monitor Anesthesia Care

## 2024-05-16 MED ORDER — PROPOFOL 500 MG/50ML IV EMUL
INTRAVENOUS | Status: DC | PRN
  Administered 2024-05-16: 30 mg via INTRAVENOUS
  Administered 2024-05-16: 100 ug/kg/min via INTRAVENOUS

## 2024-05-16 MED ORDER — ACETYLCYSTEINE 20 % IN SOLN
RESPIRATORY_TRACT | Status: AC
  Filled 2024-05-16: qty 4

## 2024-05-16 MED ORDER — ALUM & MAG HYDROXIDE-SIMETH 200-200-20 MG/5ML PO SUSP
30.0000 mL | Freq: Once | ORAL | Status: AC
  Administered 2024-05-16: 30 mL via ORAL
  Filled 2024-05-16: qty 30

## 2024-05-16 MED ORDER — FENTANYL CITRATE (PF) 100 MCG/2ML IJ SOLN
INTRAMUSCULAR | Status: DC | PRN
  Administered 2024-05-16: 25 ug via INTRAVENOUS

## 2024-05-16 MED ORDER — PROPOFOL 500 MG/50ML IV EMUL
INTRAVENOUS | Status: AC
  Filled 2024-05-16: qty 50

## 2024-05-16 MED ORDER — FENTANYL CITRATE (PF) 100 MCG/2ML IJ SOLN
INTRAMUSCULAR | Status: AC
  Filled 2024-05-16: qty 2

## 2024-05-16 MED ORDER — PHENYLEPHRINE 80 MCG/ML (10ML) SYRINGE FOR IV PUSH (FOR BLOOD PRESSURE SUPPORT)
PREFILLED_SYRINGE | INTRAVENOUS | Status: DC | PRN
  Administered 2024-05-16: 80 ug via INTRAVENOUS
  Administered 2024-05-16: 160 ug via INTRAVENOUS
  Administered 2024-05-16 (×2): 80 ug via INTRAVENOUS

## 2024-05-16 MED ORDER — SODIUM CHLORIDE 0.9 % IV SOLN: INTRAVENOUS | Status: DC | PRN

## 2024-05-16 MED ORDER — PANTOPRAZOLE SODIUM 40 MG PO TBEC: 40.0000 mg | DELAYED_RELEASE_TABLET | Freq: Two times a day (BID) | ORAL | 3 refills | Status: DC

## 2024-05-16 MED ORDER — HYOSCYAMINE SULFATE 0.125 MG SL SUBL
0.2500 mg | SUBLINGUAL_TABLET | Freq: Once | SUBLINGUAL | Status: AC
  Administered 2024-05-16: 0.25 mg via SUBLINGUAL
  Filled 2024-05-16: qty 2

## 2024-05-16 MED ORDER — IPRATROPIUM-ALBUTEROL 0.5-2.5 (3) MG/3ML IN SOLN
RESPIRATORY_TRACT | Status: AC
  Filled 2024-05-16: qty 3

## 2024-05-16 MED ORDER — SUCRALFATE 1 G PO TABS: 1.0000 g | ORAL_TABLET | Freq: Three times a day (TID) | ORAL | 3 refills | Status: DC

## 2024-05-16 MED ORDER — ONDANSETRON HCL 4 MG/2ML IJ SOLN
INTRAMUSCULAR | Status: DC | PRN
  Administered 2024-05-16: 4 mg via INTRAVENOUS

## 2024-05-16 MED ORDER — LIDOCAINE 2% (20 MG/ML) 5 ML SYRINGE
INTRAMUSCULAR | Status: DC | PRN
  Administered 2024-05-16: 60 mg via INTRAVENOUS
  Administered 2024-05-16: 20 mg via INTRAVENOUS

## 2024-05-16 MED ORDER — STERILE WATER FOR INJECTION IJ SOLN
RESPIRATORY_TRACT | Status: DC | PRN
  Administered 2024-05-16: 55 mL via OROMUCOSAL

## 2024-05-16 MED ORDER — IPRATROPIUM-ALBUTEROL 0.5-2.5 (3) MG/3ML IN SOLN
3.0000 mL | Freq: Once | RESPIRATORY_TRACT | Status: AC
  Administered 2024-05-16: 3 mL via RESPIRATORY_TRACT

## 2024-05-16 MED ORDER — SODIUM CHLORIDE 0.9 % IV SOLN: INTRAVENOUS | Status: DC

## 2024-05-16 NOTE — Telephone Encounter (Signed)
-----   Message from [facility] sent at 05/16/2024  8:52 AM EDT ----- Regarding: Followup Sierra Logan, Let's get this patient set up for Hematology evaluation. She has had IV iron administered in the past by PCP, but she is going to need ongoing surveillance and IV Iron infusions. It will be best to have her established. Please set this up as urgent. Repeat EGD with RFA in 6 weeks. Thanks. GM

## 2024-05-16 NOTE — Op Note (Signed)
 [facility] Patient Name: Sierra Logan Procedure Date: 05/16/2024 MRN: [id number] Attending MD: Aloha Finner , MD, [id number] Date of Birth: 1956-05-16 CSN: [id number] Age: 68 Admit Type: Outpatient Procedure:                Upper GI endoscopy Indications:              Iron deficiency anemia secondary to chronic blood                            loss, Esophageal varices, Watermelon stomach (GAVE                            syndrome) Providers:                Aloha Finner, MD, Ozell Pouch, Curtistine Bishop, Technician Referring MD:              Medicines:                Monitored Anesthesia Care Complications:            No immediate complications. Estimated Blood Loss:     Estimated blood loss was minimal. Procedure:                Pre-Anesthesia Assessment:                           - Prior to the procedure, a History and Physical                            was performed, and patient medications and                            allergies were reviewed. The patient's tolerance of                            previous anesthesia was also reviewed. The risks                            and benefits of the procedure and the sedation                            options and risks were discussed with the patient.                            All questions were answered, and informed consent                            was obtained. Prior Anticoagulants: The patient has                            taken no anticoagulant or antiplatelet agents. ASA                            Grade Assessment: III -  A patient with severe                            systemic disease. After reviewing the risks and                            benefits, the patient was deemed in satisfactory                            condition to undergo the procedure.                           After obtaining informed consent, the endoscope was                            passed  under direct vision. Throughout the                            procedure, the patient's blood pressure, pulse, and                            oxygen  saturations were monitored continuously. The                            [id number] ([id number]) Olympus endoscope was                            introduced through the mouth, and advanced to the                            second part of duodenum. The upper GI endoscopy was                            accomplished without difficulty. The patient                            tolerated the procedure. Scope In: Scope Out: Findings:      No gross lesions were noted in the proximal esophagus and in the mid       esophagus.      Grade I, grade II varices were found in the distal esophagus.      The Z-line was irregular and was found 35 cm from the incisors.      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 Cannot       perform ablations due to the EVs in this area.      Moderate portal hypertensive gastropathy was found in the entire       examined stomach. Some areas within the body of the stomach had oozing       noted while beginning the endoscopy today. Focal radiofrequency ablation       to areas of oozing PHG in the stomach was performed. With the endoscope       in place, the position and extent of the abnormal mucosa and appropriate       anatomic landmarks were noted. Gastric contents were suctioned. The  radiofrequency channel ablation catheter was introduced through the       endoscope working channel. The endoscope with the ablation catheter was       advanced to the areas of abnormal mucosa. The endoscope with the channel       ablation catheter was positioned under direct visualization so that the       catheter was placed in contact with the surface of the abnormal mucosa.       Energy was applied twice at 12 J/cm2. The ablation catheter was removed       through the endoscope working channel. The areas where  abnormal mucosa       had been ablated were examined. Whitish changes of ablated mucosa were       present. The total number of energy applications for all mucosal sites       treated was 28. The patient had no complications.      An angulation deformity was found in the distal gastric antrum. This is       likely from prior treatments that the region has become smaller than       normal.      Moderate, diffuse gastric antral vascular ectasia with bleeding was       present in the distal gastric antrum/prepylorus/pylorus channel. Focal       radiofrequency ablation of gastric antral vascular ectasia in the       stomach was performed. With the endoscope in place, the position and       extent of the abnormal mucosa and appropriate anatomic landmarks were       noted. The abnormal mucosa was irrigated with N-acetylcysteine        (Mucomyst ) 1% mixed with water . Gastric contents were suctioned. The       radiofrequency channel ablation catheter was introduced through the       endoscope working channel. The endoscope with the ablation catheter was       advanced to the areas of abnormal mucosa. The endoscope with the channel       ablation catheter was positioned under direct visualization so that the       catheter was placed in contact with the surface of the abnormal mucosa.       Energy was applied twice at 12 J/cm2. Ablation was repeated in a       likewise fashion to all visible abnormal mucosa. The channel ablation       catheter was then removed through the endoscope working channel, and the       ablation catheter was cleaned. The endoscope was left in place. The       ablation zone was cleaned of coagulative debris. The ablation catheter       was reinserted into the endoscope working channel. A second round of       ablation was then performed. Energy was applied twice at 12 J/cm2 to       retreat the areas of abnormal mucosa that had been treated with the       first series of  ablation. The ablation catheter was removed through the       endoscope working channel. The areas where abnormal mucosa had been       ablated were examined. Areas of abnormal mucosa appeared completely       ablated. The total number of energy applications for all mucosal sites       treated was 60.  The patient had no complications.      Patchy moderate inflammation characterized by congestion (edema),       erythema and nodularity was found in the duodenal bulb, in the first       portion of the duodenum and in the second portion of the duodenum. Impression:               - No gross lesions in the proximal esophagus and in                            the mid esophagus.                           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                           - Z-line irregular, 35 cm from the incisors.                           - Gastric antral vascular ectasia in Cardia -                            cannot treat due to EVs in the region of distal                            esophagus to GE Jxn.                           - Moderate Portal hypertensive gastropathy. Some                            areas of oozing noted. Treated those areas with                            radiofrequency ablation (cannot ablate all PHG).                           - Acquired angulation deformity in the distal                            gastric antrum.                           - Gastric antral vascular ectasia with bleeding in                            the distal antrum/prepylorus/pyloric channel.                            Treated with radiofrequency ablation.                           - Nodular and erythematous duodenitis. Moderate Sedation:  Not Applicable - Patient had care per Anesthesia. Recommendation:           - The patient will be observed post-procedure,                            until all discharge criteria are met.                           -  Discharge patient to home.                           - Patient has a contact number available for                            emergencies. The signs and symptoms of potential                            delayed complications were discussed with the                            patient. Return to normal activities tomorrow.                            Written discharge instructions were provided to the                            patient.                           - Advance diet as tolerated.                           - Continue PPI twice daily.                           - Carafate  1 g 4 times daily for 1 month and then                            down to 2 times daily.                           - Will recommend formal Hematology evaluation (this                            patient is going to need ongoing IV Iron infusions                            and monitoring to try and keep up with her anemia).                            Will have my team work on this.                           - Repeat EGD in 6-weeks for retreatment.                           -  Continue betablocker for variceal prophylaxis of                            bleeding (at some point may need to consider EVL).                           - Appreciate [facility] in evaluation of                            this patient and co-management. Pending a Triple                            Phase protocol CT per last notation in chart.                           - The findings and recommendations were discussed                            with the patient.                           - The findings and recommendations were discussed                            with the patient's family. Procedure Code(s):        --- Professional ---                           [phone], Esophagogastroduodenoscopy, flexible,                            transoral; with ablation of tumor(s), polyp(s), or                            other lesion(s) (includes pre- and  post-dilation                            and guide wire passage, when performed)                           43255, 59, Esophagogastroduodenoscopy, flexible,                            transoral; with control of bleeding, any method Diagnosis Code(s):        --- Professional ---                           I85.00, Esophageal varices without bleeding                           K22.89, Other specified disease of esophagus                           K31.811, Angiodysplasia of stomach and duodenum  with bleeding                           K76.6, Portal hypertension                           K31.89, Other diseases of stomach and duodenum                           K29.80, Duodenitis without bleeding                           D50.0, Iron deficiency anemia secondary to blood                            loss (chronic) CPT copyright 2022 American Medical Association. All rights reserved. The codes documented in this report are preliminary and upon coder review may  be revised to meet current compliance requirements. Aloha Finner, MD 05/16/2024 9:00:43 AM Number of Addenda: 0

## 2024-05-16 NOTE — Progress Notes (Signed)
 Dr merla made aware of low bp and o2 sat on room air.  Nebulizer treatment ordered prior to discharge.

## 2024-05-16 NOTE — Transfer of Care (Signed)
 Immediate Anesthesia Transfer of Care Note  Patient: Sierra Logan  Procedure(s) Performed: EGD (ESOPHAGOGASTRODUODENOSCOPY) RADIOFREQUENCY ABLATION, UPPER GI TRACT, ENDOSCOPIC  Patient Location: PACU and Endoscopy Unit  Anesthesia Type:MAC  Level of Consciousness: drowsy and patient cooperative  Airway & Oxygen  Therapy: Patient Spontanous Breathing and Patient connected to face mask oxygen   Post-op Assessment: Report given to RN and Post -op Vital signs reviewed and stable  Post vital signs: Reviewed and stable  Last Vitals:  Vitals Value Taken Time  BP 117/39 05/16/24 08:50  Temp    Pulse 69 05/16/24 08:50  Resp 10 05/16/24 08:50  SpO2 99 % 05/16/24 08:50  Vitals shown include unfiled device data.  Last Pain:  Vitals:   05/16/24 0715  TempSrc: Temporal  PainSc: 0-No pain         Complications: No notable events documented.

## 2024-05-16 NOTE — Anesthesia Postprocedure Evaluation (Signed)
 Anesthesia Post Note  Patient: Sierra Logan  Procedure(s) Performed: EGD (ESOPHAGOGASTRODUODENOSCOPY) RADIOFREQUENCY ABLATION, UPPER GI TRACT, ENDOSCOPIC     Patient location during evaluation: PACU Anesthesia Type: MAC Level of consciousness: awake and alert Pain management: pain level controlled Vital Signs Assessment: post-procedure vital signs reviewed and stable Respiratory status: spontaneous breathing, nonlabored ventilation and respiratory function stable Cardiovascular status: blood pressure returned to baseline and stable Postop Assessment: no apparent nausea or vomiting Anesthetic complications: no   No notable events documented.  Last Vitals:  Vitals:   05/16/24 0910 05/16/24 0920  BP: (!) 104/51   Pulse: 72 72  Resp: 15 15  Temp:    SpO2: 95% 90%    Last Pain:  Vitals:   05/16/24 0920  TempSrc:   PainSc: 0-No pain                 [name]

## 2024-05-16 NOTE — Discharge Instructions (Signed)

## 2024-05-16 NOTE — H&P (Signed)
 GASTROENTEROLOGY PROCEDURE H&P NOTE   Primary Care Physician: Rollene Almarie LABOR, MD  HPI: Sierra Logan is a 68 y.o. female who presents for EGD for followup GAVE and PB Cirrhosis and Evs.  Past Medical History:  Diagnosis Date   Cirrhosis (HCC)    Diabetes mellitus without complication (HCC)    Esophageal varices (HCC)    GAVE (gastric antral vascular ectasia)    Hyperlipemia    Hypertension    IDA (iron deficiency anemia)    Neuropathy    Spleen disease    Past Surgical History:  Procedure Laterality Date   BIOPSY  10/20/2020   Procedure: BIOPSY;  Surgeon: Wilhelmenia Aloha Raddle., MD;  Location: [facility];  Service: Gastroenterology;;   COLONOSCOPY WITH PROPOFOL  N/A 10/20/2020   Procedure: COLONOSCOPY WITH PROPOFOL ;  Surgeon: Wilhelmenia Aloha Raddle., MD;  Location: [facility];  Service: Gastroenterology;  Laterality: N/A;   ESOPHAGOGASTRODUODENOSCOPY N/A 11/09/2023   Procedure: EGD (ESOPHAGOGASTRODUODENOSCOPY);  Surgeon: Nandigam, Kavitha V, MD;  Location: [facility] END[facility]COPY;  Service: Gastroenterology;  Laterality: N/A;   ESOPHAGOGASTRODUODENOSCOPY N/A 12/22/2023   Procedure: EGD (ESOPHAGOGASTRODUODENOSCOPY);  Surgeon: Suzann Inocente HERO, MD;  Location: [facility] ENDOSCOPY;  Service: Gastroenterology;  Laterality: N/A;   ESOPHAGOGASTRODUODENOSCOPY N/A 02/14/2024   Procedure: EGD (ESOPHAGOGASTRODUODENOSCOPY);  Surgeon: Wilhelmenia Aloha Raddle., MD;  Location: [facility];  Service: Gastroenterology;  Laterality: N/A;   ESOPHAGOGASTRODUODENOSCOPY N/A 03/01/2024   Procedure: EGD (ESOPHAGOGASTRODUODENOSCOPY);  Surgeon: Wilhelmenia Aloha Raddle., MD;  Location: [facility];  Service: Gastroenterology;  Laterality: N/A;   ESOPHAGOGASTRODUODENOSCOPY (EGD) WITH PROPOFOL  N/A 10/20/2020   Procedure: ESOPHAGOGASTRODUODENOSCOPY (EGD) WITH PROPOFOL ;  Surgeon: Wilhelmenia Aloha Raddle., MD;  Location: [facility];  Service: Gastroenterology;  Laterality: N/A;   GI RADIOFREQUENCY ABLATION  02/14/2024    Procedure: RADIOFREQUENCY ABLATION, UPPER GI TRACT, ENDOSCOPIC;  Surgeon: Wilhelmenia, Aloha Raddle., MD;  Location: [facility];  Service: Gastroenterology;;   GI RADIOFREQUENCY ABLATION  03/01/2024   Procedure: RADIOFREQUENCY ABLATION, UPPER GI TRACT, ENDOSCOPIC;  Surgeon: Wilhelmenia Aloha Raddle., MD;  Location: [facility];  Service: Gastroenterology;;   HOT HEMOSTASIS N/A 11/09/2023   Procedure: EGD, WITH ARGON PLASMA COAGULATION;  Surgeon: Shila Gustav GAILS, MD;  Location: [facility];  Service: Gastroenterology;  Laterality: N/A;   HOT HEMOSTASIS N/A 12/22/2023   Procedure: EGD, WITH ARGON PLASMA COAGULATION;  Surgeon: Suzann Inocente HERO, MD;  Location: [facility] ENDOSCOPY;  Service: Gastroenterology;  Laterality: N/A;   INTRAOPERATIVE TRANSTHORACIC ECHOCARDIOGRAM  10/17/2020   Procedure: TRANSTHORACIC ECHOCARDIOGRAM;  Surgeon: Okey Vina GAILS, MD;  Location: [facility] ENDOSCOPY;  Service: Cardiovascular;;   IR PARACENTESIS  11/10/2023   IR THORACENTESIS ASP PLEURAL SPACE W/IMG GUIDE  11/21/2023   POLYPECTOMY  10/20/2020   Procedure: POLYPECTOMY;  Surgeon: Mansouraty, Aloha Raddle., MD;  Location: [facility];  Service: Gastroenterology;;   RADIOFREQUENCY ABLATION  10/20/2020   Procedure: RADIO FREQUENCY ABLATION;  Surgeon: Wilhelmenia Aloha Raddle., MD;  Location: [facility];  Service: Gastroenterology;;   RECONSTRUCTION OF NOSE     TRACHEOSTOMY     TRACHEOSTOMY CLOSURE     Current Facility-Administered Medications  Medication Dose Route Frequency Provider Last Rate Last Admin   0.9 %  sodium chloride  infusion   Intravenous Continuous Mansouraty, Aloha Raddle., MD 20 mL/hr at 05/16/24 0722 New Bag at 05/16/24 0722    Current Facility-Administered Medications:    0.9 %  sodium chloride  infusion, , Intravenous, Continuous, Mansouraty, Aloha Raddle., MD, Last Rate: 20 mL/hr at 05/16/24 0722, New Bag at 05/16/24 0722 No Known Allergies Family History  Problem Relation Age of  Onset   GER disease Mother     Heart disease Mother    Diabetes Father    Hypertension Sister    Hypertension Brother    Hypertension Brother    Hypertension Brother    Hypertension Brother    Prostate cancer Maternal Grandfather    Colon cancer Neg Hx    Esophageal cancer Neg Hx    Kidney disease Neg Hx    Liver disease Neg Hx    Pancreatic cancer Neg Hx    Rectal cancer Neg Hx    Stomach cancer Neg Hx    Inflammatory bowel disease Neg Hx    Social History   Socioeconomic History   Marital status: Married    Spouse name: Sierra Logan   Number of children: 0   Years of education: Not on file   Highest education level: Not on file  Occupational History   Occupation: RETIRED/part time courier  Tobacco Use   Smoking status: Some Days    Current packs/day: 0.50    Average packs/day: 0.5 packs/day for 56.8 years (28.4 ttl pk-yrs)    Types: Cigarettes    Start date: 07/20/1967   Smokeless tobacco: Never   Tobacco comments:    smokes 1 per day.  Vaping Use   Vaping status: Never Used  Substance and Sexual Activity   Alcohol use: Not Currently   Drug use: Never   Sexual activity: Not on file  Other Topics Concern   Not on file  Social History Narrative   Lives with husband/2025   Social Drivers of Health   Financial Resource Strain: Low Risk  (03/30/2024)   Overall Financial Resource Strain (CARDIA)    Difficulty of Paying Living Expenses: Not very hard  Food Insecurity: Low Risk  (05/01/2024)   Received from [facility]   Hunger Vital Sign    Within the past 12 months, you worried that your food would run out before you got money to buy more: Never true    Within the past 12 months, the food you bought just didn't last and you didn't have money to get more. : Never true  Transportation Needs: No Transportation Needs (05/01/2024)   Received from [facility]    In the past 12 months, has lack of reliable transportation kept you from medical appointments, meetings, work or from getting  things needed for daily living? : No  Physical Activity: Inactive (03/30/2024)   Exercise Vital Sign    Days of Exercise per Week: 0 days    Minutes of Exercise per Session: 0 min  Stress: No Stress Concern Present (03/30/2024)   [employer] of Occupational Health - Occupational Stress Questionnaire    Feeling of Stress: Not at all  Social Connections: Moderately Isolated (03/30/2024)   Social Connection and Isolation Panel    Frequency of Communication with Friends and Family: Twice a week    Frequency of Social Gatherings with Friends and Family: Once a week    Attends Religious Services: Never    Database Administrator or Organizations: No    Attends Banker Meetings: Never    Marital Status: Married  Catering Manager Violence: Not At Risk (03/30/2024)   Humiliation, Afraid, Rape, and Kick questionnaire    Fear of Current or Ex-Partner: No    Emotionally Abused: No    Physically Abused: No    Sexually Abused: No    Physical Exam: Today's Vitals   05/16/24 0715  BP: (!) 98/46  Pulse: 98  Resp: (!) 22  Temp: 97.7 F (36.5 C)  TempSrc: Temporal  SpO2: 98%  Weight: 81.6 kg  Height: 5' 2 (1.575 m)  PainSc: 0-No pain   Body mass index is 32.92 kg/m. GEN: NAD EYE: Sclerae anicteric ENT: MMM CV: Non-tachycardic GI: Soft, NT/ND NEURO:  Alert & Oriented x 3  Lab Results: No results for input(s): WBC, HGB, HCT, PLT in the last 72 hours. BMET No results for input(s): NA, K, CL, CO2, GLUCOSE, BUN, CREATININE, CALCIUM  in the last 72 hours. LFT No results for input(s): PROT, ALBUMIN , AST, ALT, ALKPHOS, BILITOT, BILIDIR, IBILI in the last 72 hours. PT/INR No results for input(s): LABPROT, INR in the last 72 hours.   Impression / Plan: This is a 68 y.o.female who presents for EGD for followup GAVE and PB Cirrhosis and Evs.  The risks and benefits of endoscopic evaluation/treatment were discussed with the patient  and/or family; these include but are not limited to the risk of perforation, infection, bleeding, missed lesions, lack of diagnosis, severe illness requiring hospitalization, as well as anesthesia and sedation related illnesses.  The patient's history has been reviewed, patient examined, no change in status, and deemed stable for procedure.  The patient and/or family is agreeable to proceed.    Aloha Finner, MD [facility] Advanced [facility]oscopy Office # [phone]

## 2024-05-16 NOTE — Anesthesia Procedure Notes (Signed)
 Procedure Name: MAC Date/Time: 05/16/2024 8:17 AM  Performed by: Laverda Burnard LABOR, CRNAPre-anesthesia Checklist: Patient identified, Emergency Drugs available, Suction available and Patient being monitored Patient Re-evaluated:Patient Re-evaluated prior to induction Oxygen  Delivery Method: Simple face mask Preoxygenation: Pre-oxygenation with 100% oxygen  Induction Type: IV induction Airway Equipment and Method: Bite block Placement Confirmation: positive ETCO2 and breath sounds checked- equal and bilateral Dental Injury: Teeth and Oropharynx as per pre-operative assessment

## 2024-05-16 NOTE — Telephone Encounter (Signed)
 EGD RFA set up for 07/02/24 at 1145 am at [facility] with GM   EGD RFA scheduled, pt instructed and medications reviewed.  Patient instructions mailed to home.  Patient to call with any questions or concerns.

## 2024-05-16 NOTE — Telephone Encounter (Signed)
 Referral made  Will set up EGD RFA in 6 weeks

## 2024-05-24 ENCOUNTER — Inpatient Hospital Stay (HOSPITAL_BASED_OUTPATIENT_CLINIC_OR_DEPARTMENT_OTHER): Admitting: Hematology and Oncology

## 2024-05-24 ENCOUNTER — Inpatient Hospital Stay: Attending: Hematology and Oncology

## 2024-05-24 ENCOUNTER — Other Ambulatory Visit: Payer: Self-pay | Admitting: Hematology and Oncology

## 2024-05-24 VITALS — BP 106/52 | HR 82 | Temp 97.5°F | Resp 16 | Wt 175.7 lb

## 2024-05-24 DIAGNOSIS — D500 Iron deficiency anemia secondary to blood loss (chronic): Secondary | ICD-10-CM | POA: Diagnosis not present

## 2024-05-24 DIAGNOSIS — D735 Infarction of spleen: Secondary | ICD-10-CM | POA: Diagnosis not present

## 2024-05-24 DIAGNOSIS — D509 Iron deficiency anemia, unspecified: Secondary | ICD-10-CM | POA: Diagnosis not present

## 2024-05-24 DIAGNOSIS — Z809 Family history of malignant neoplasm, unspecified: Secondary | ICD-10-CM | POA: Diagnosis not present

## 2024-05-24 DIAGNOSIS — F17210 Nicotine dependence, cigarettes, uncomplicated: Secondary | ICD-10-CM | POA: Diagnosis not present

## 2024-05-24 DIAGNOSIS — I10 Essential (primary) hypertension: Secondary | ICD-10-CM | POA: Diagnosis not present

## 2024-05-24 DIAGNOSIS — Z79899 Other long term (current) drug therapy: Secondary | ICD-10-CM | POA: Diagnosis not present

## 2024-05-24 DIAGNOSIS — E119 Type 2 diabetes mellitus without complications: Secondary | ICD-10-CM | POA: Diagnosis not present

## 2024-05-24 DIAGNOSIS — E785 Hyperlipidemia, unspecified: Secondary | ICD-10-CM | POA: Diagnosis not present

## 2024-05-24 LAB — CBC WITH DIFFERENTIAL (CANCER CENTER ONLY)
Abs Immature Granulocytes: 0.02 K/uL (ref 0.00–0.07)
Basophils Absolute: 0.1 K/uL (ref 0.0–0.1)
Basophils Relative: 1 %
Eosinophils Absolute: 0 K/uL (ref 0.0–0.5)
Eosinophils Relative: 0 %
HCT: 31.4 % — ABNORMAL LOW (ref 36.0–46.0)
Hemoglobin: 9.2 g/dL — ABNORMAL LOW (ref 12.0–15.0)
Immature Granulocytes: 0 %
Lymphocytes Relative: 19 %
Lymphs Abs: 1.2 K/uL (ref 0.7–4.0)
MCH: 25 pg — ABNORMAL LOW (ref 26.0–34.0)
MCHC: 29.3 g/dL — ABNORMAL LOW (ref 30.0–36.0)
MCV: 85.3 fL (ref 80.0–100.0)
Monocytes Absolute: 0.6 K/uL (ref 0.1–1.0)
Monocytes Relative: 9 %
Neutro Abs: 4.5 K/uL (ref 1.7–7.7)
Neutrophils Relative %: 71 %
Platelet Count: 210 K/uL (ref 150–400)
RBC: 3.68 MIL/uL — ABNORMAL LOW (ref 3.87–5.11)
RDW: 14.5 % (ref 11.5–15.5)
WBC Count: 6.4 K/uL (ref 4.0–10.5)
nRBC: 0 % (ref 0.0–0.2)

## 2024-05-24 LAB — RETIC PANEL
Immature Retic Fract: 30.2 % — ABNORMAL HIGH (ref 2.3–15.9)
RBC.: 3.71 MIL/uL — ABNORMAL LOW (ref 3.87–5.11)
Retic Count, Absolute: 117.2 K/uL (ref 19.0–186.0)
Retic Ct Pct: 3.2 % — ABNORMAL HIGH (ref 0.4–3.1)
Reticulocyte Hemoglobin: 24.8 pg — ABNORMAL LOW (ref >27.9)

## 2024-05-24 LAB — CMP (CANCER CENTER ONLY)
ALT: 12 U/L (ref 0–44)
AST: 24 U/L (ref 15–41)
Albumin: 2.5 g/dL — ABNORMAL LOW (ref 3.5–5.0)
Alkaline Phosphatase: 144 U/L — ABNORMAL HIGH (ref 38–126)
Anion gap: 3 — ABNORMAL LOW (ref 5–15)
BUN: 10 mg/dL (ref 8–23)
CO2: 35 mmol/L — ABNORMAL HIGH (ref 22–32)
Calcium: 8.4 mg/dL — ABNORMAL LOW (ref 8.9–10.3)
Chloride: 95 mmol/L — ABNORMAL LOW (ref 98–111)
Creatinine: 0.72 mg/dL (ref 0.44–1.00)
GFR, Estimated: >60 mL/min (ref >60)
Glucose, Bld: 158 mg/dL — ABNORMAL HIGH (ref 70–99)
Potassium: 3.7 mmol/L (ref 3.5–5.1)
Sodium: 133 mmol/L — ABNORMAL LOW (ref 135–145)
Total Bilirubin: 0.4 mg/dL (ref 0.0–1.2)
Total Protein: 7.5 g/dL (ref 6.5–8.1)

## 2024-05-24 LAB — FERRITIN: Ferritin: 41 ng/mL (ref 11–307)

## 2024-05-24 LAB — VITAMIN B12: Vitamin B-12: 1050 pg/mL — ABNORMAL HIGH (ref 180–914)

## 2024-05-24 LAB — FOLATE: Folate: 17.6 ng/mL (ref >5.9)

## 2024-05-24 LAB — IRON AND IRON BINDING CAPACITY (CC-WL,HP ONLY)
Iron: 182 ug/dL — ABNORMAL HIGH (ref 28–170)
Saturation Ratios: 54 % — ABNORMAL HIGH (ref 10.4–31.8)
TIBC: 336 ug/dL (ref 250–450)
UIBC: 154 ug/dL (ref 148–442)

## 2024-05-24 NOTE — Progress Notes (Signed)
 [facility] [phone]   [phone]  PROGRESS NOTE  Patient Care Team: Rollene Almarie LABOR, MD as PCP - [facility]  Hematological/Oncological History # Splenic Infarct  08/27/2020: patient presented to [facility] ED with abdominal pain. CT abdomen showed advanced cirrhosis of the liver. Splenomegaly. 4-5 cm hypoperfused area in the anterior superior corner of the spleen presumably relating to a splenic infarction  #Iron Deficiency Anemia of Unclear Etiology, Likely GI Bleed 08/27/2020: WBC 11.4, Hgb 8.1, MCV 75.6, Plt 196 08/28/2020: Iron 20, TIBC 466, Sat 4%, Ferritin 8 12/01/2020: WBC 8.3, Hgb 13.1, MCV 87.8, Plt 156. Ferritin 27.2 02/19/2021: Hgb 13.0, Iron 82, Iron sat 24%, ferritin 42  Interval History:  Sierra Logan 68 y.o. female with medical history significant for splenic infarct and iron deficiency anemia who presents for a follow up visit. The patient was last seen on 02/19/2021. In the interim since the last visit she has had no major changes in her health.  On exam today Sierra Logan reports she has been well overall in the interim since our last visit.  She reports that she is continuing to take her iron pills though does cause some occasional constipation and stomach upset.  She reports that she is not currently having any lightheadedness dizziness or shortness of breath.  She reports is not having overt signs of bleeding, bruising, or dark stools.  She reports she is currently now using oxygen  at night.  She reports that she has had no evidence of dark bowel movements and does enjoy eating red meat such as hamburger and steaks.  She has had no recent infections such as runny nose, sore throat, cough.  She denies any fevers, chills, sweats.  She notes nothing else out of the ordinary in the interim since our last visit.  A full 10 point ROS is otherwise negative.  MEDICAL HISTORY:  Past Medical History:   Diagnosis Date   Cirrhosis (HCC)    Diabetes mellitus without complication (HCC)    Esophageal varices (HCC)    GAVE (gastric antral vascular ectasia)    Hyperlipemia    Hypertension    IDA (iron deficiency anemia)    Neuropathy    Spleen disease     SURGICAL HISTORY: Past Surgical History:  Procedure Laterality Date   BIOPSY  10/20/2020   Procedure: BIOPSY;  Surgeon: Wilhelmenia Aloha Raddle., MD;  Location: [facility];  Service: Gastroenterology;;   COLONOSCOPY WITH PROPOFOL  N/A 10/20/2020   Procedure: COLONOSCOPY WITH PROPOFOL ;  Surgeon: Wilhelmenia Aloha Raddle., MD;  Location: [facility];  Service: Gastroenterology;  Laterality: N/A;   ESOPHAGOGASTRODUODENOSCOPY N/A 11/09/2023   Procedure: EGD (ESOPHAGOGASTRODUODENOSCOPY);  Surgeon: Nandigam, Kavitha V, MD;  Location: [facility];  Service: Gastroenterology;  Laterality: N/A;   ESOPHAGOGASTRODUODENOSCOPY N/A 12/22/2023   Procedure: EGD (ESOPHAGOGASTRODUODENOSCOPY);  Surgeon: Suzann Inocente HERO, MD;  Location: [facility];  Service: Gastroenterology;  Laterality: N/A;   ESOPHAGOGASTRODUODENOSCOPY N/A 02/14/2024   Procedure: EGD (ESOPHAGOGASTRODUODENOSCOPY);  Surgeon: Wilhelmenia Aloha Raddle., MD;  Location: [facility];  Service: Gastroenterology;  Laterality: N/A;   ESOPHAGOGASTRODUODENOSCOPY N/A 03/01/2024   Procedure: EGD (ESOPHAGOGASTRODUODENOSCOPY);  Surgeon: Wilhelmenia Aloha Raddle., MD;  Location: [facility];  Service: Gastroenterology;  Laterality: N/A;   ESOPHAGOGASTRODUODENOSCOPY N/A 05/16/2024   Procedure: EGD (ESOPHAGOGASTRODUODENOSCOPY);  Surgeon: Wilhelmenia Aloha Raddle., MD;  Location: [facility];  Service: Gastroenterology;  Laterality: N/A;   ESOPHAGOGASTRODUODENOSCOPY (EGD) WITH PROPOFOL  N/A 10/20/2020   Procedure: ESOPHAGOGASTRODUODENOSCOPY (EGD) WITH PROPOFOL ;  Surgeon: Wilhelmenia Aloha Raddle., MD;  Location: [facility];  Service: Gastroenterology;  Laterality: N/A;   GI RADIOFREQUENCY ABLATION  02/14/2024    Procedure: RADIOFREQUENCY ABLATION, UPPER GI TRACT, ENDOSCOPIC;  Surgeon: Wilhelmenia Aloha Raddle., MD;  Location: [facility];  Service: Gastroenterology;;   GI RADIOFREQUENCY ABLATION  03/01/2024   Procedure: RADIOFREQUENCY ABLATION, UPPER GI TRACT, ENDOSCOPIC;  Surgeon: Wilhelmenia Aloha Raddle., MD;  Location: [facility];  Service: Gastroenterology;;   GI RADIOFREQUENCY ABLATION N/A 05/16/2024   Procedure: RADIOFREQUENCY ABLATION, UPPER GI TRACT, ENDOSCOPIC;  Surgeon: Wilhelmenia Aloha Raddle., MD;  Location: [facility];  Service: Gastroenterology;  Laterality: N/A;   HOT HEMOSTASIS N/A 11/09/2023   Procedure: EGD, WITH ARGON PLASMA COAGULATION;  Surgeon: Shila Gustav GAILS, MD;  Location: [facility];  Service: Gastroenterology;  Laterality: N/A;   HOT HEMOSTASIS N/A 12/22/2023   Procedure: EGD, WITH ARGON PLASMA COAGULATION;  Surgeon: Suzann Inocente HERO, MD;  Location: [facility] ENDOSCOPY;  Service: Gastroenterology;  Laterality: N/A;   INTRAOPERATIVE TRANSTHORACIC ECHOCARDIOGRAM  10/17/2020   Procedure: TRANSTHORACIC ECHOCARDIOGRAM;  Surgeon: Okey Vina GAILS, MD;  Location: [facility] ENDOSCOPY;  Service: Cardiovascular;;   IR PARACENTESIS  11/10/2023   IR THORACENTESIS ASP PLEURAL SPACE W/IMG GUIDE  11/21/2023   POLYPECTOMY  10/20/2020   Procedure: POLYPECTOMY;  Surgeon: Mansouraty, Aloha Raddle., MD;  Location: [facility];  Service: Gastroenterology;;   RADIOFREQUENCY ABLATION  10/20/2020   Procedure: RADIO FREQUENCY ABLATION;  Surgeon: Wilhelmenia Aloha Raddle., MD;  Location: [facility];  Service: Gastroenterology;;   RECONSTRUCTION OF NOSE     TRACHEOSTOMY     TRACHEOSTOMY CLOSURE      SOCIAL HISTORY: Social History   Socioeconomic History   Marital status: Married    Spouse name: Deon Ivey   Number of children: 0   Years of education: Not on file   Highest education level: Not on file  Occupational History   Occupation: RETIRED/part time courier  Tobacco Use   Smoking status: Some Days     Current packs/day: 0.50    Average packs/day: 0.5 packs/day for 56.9 years (28.4 ttl pk-yrs)    Types: Cigarettes    Start date: 07/20/1967   Smokeless tobacco: Never   Tobacco comments:    smokes 1 per day.  Vaping Use   Vaping status: Never Used  Substance and Sexual Activity   Alcohol use: Not Currently   Drug use: Never   Sexual activity: Not on file  Other Topics Concern   Not on file  Social History Narrative   Lives with husband/2025   Social Drivers of Health   Financial Resource Strain: Low Risk  (03/30/2024)   Overall Financial Resource Strain (CARDIA)    Difficulty of Paying Living Expenses: Not very hard  Food Insecurity: Low Risk  (05/01/2024)   Received from [facility]   Hunger Vital Sign    Within the past 12 months, you worried that your food would run out before you got money to buy more: Never true    Within the past 12 months, the food you bought just didn't last and you didn't have money to get more. : Never true  Transportation Needs: No Transportation Needs (05/01/2024)   Received from [facility]    In the past 12 months, has lack of reliable transportation kept you from medical appointments, meetings, work or from getting things needed for daily living? : No  Physical Activity: Inactive (03/30/2024)   Exercise Vital Sign    Days of Exercise per Week: 0 days    Minutes of Exercise  per Session: 0 min  Stress: No Stress Concern Present (03/30/2024)   [employer] of Occupational Health - Occupational Stress Questionnaire    Feeling of Stress: Not at all  Social Connections: Moderately Isolated (03/30/2024)   Social Connection and Isolation Panel    Frequency of Communication with Friends and Family: Twice a week    Frequency of Social Gatherings with Friends and Family: Once a week    Attends Religious Services: Never    Database Administrator or Organizations: No    Attends Banker Meetings: Never    Marital Status:  Married  Catering Manager Violence: Not At Risk (03/30/2024)   Humiliation, Afraid, Rape, and Kick questionnaire    Fear of Current or Ex-Partner: No    Emotionally Abused: No    Physically Abused: No    Sexually Abused: No    FAMILY HISTORY: Family History  Problem Relation Age of Onset   GER disease Mother    Heart disease Mother    Diabetes Father    Hypertension Sister    Hypertension Brother    Hypertension Brother    Hypertension Brother    Hypertension Brother    Prostate cancer Maternal Grandfather    Colon cancer Neg Hx    Esophageal cancer Neg Hx    Kidney disease Neg Hx    Liver disease Neg Hx    Pancreatic cancer Neg Hx    Rectal cancer Neg Hx    Stomach cancer Neg Hx    Inflammatory bowel disease Neg Hx     ALLERGIES:  has no known allergies.  MEDICATIONS:  Current Outpatient Medications  Medication Sig Dispense Refill   acetaminophen  (TYLENOL ) 500 MG tablet Take 1 tablet (500 mg total) by mouth every 6 (six) hours as needed for mild pain (pain score 1-3) (or Fever >/= 101).     atorvastatin  (LIPITOR) 10 MG tablet Take 1 tablet (10 mg total) by mouth at bedtime. 90 tablet 3   BIOTIN PO Take 1 tablet by mouth daily with breakfast.     Calcium  Carb-Cholecalciferol (CALCIUM +D3 PO) Take 1 tablet by mouth daily after breakfast.     ferrous gluconate  (FERGON) 324 MG tablet Take 1 tablet (324 mg total) by mouth daily with breakfast. 90 tablet 1   furosemide  (LASIX ) 40 MG tablet Take 1 tablet (40 mg total) by mouth daily. 90 tablet 1   gabapentin  (NEURONTIN ) 400 MG capsule TAKE 1 CAPSULE EVERY MORNING AND 2 AT BEDTIME 270 capsule 1   midodrine  (PROAMATINE ) 5 MG tablet Take 3 tablets (15 mg total) by mouth 3 (three) times daily with meals. 270 tablet 3   Omega-3 Fatty Acids (FISH OIL PO) Take 1 capsule by mouth every evening.     pantoprazole  (PROTONIX ) 40 MG tablet Take 1 tablet (40 mg total) by mouth 2 (two) times daily before a meal. 180 tablet 3   propranolol   (INDERAL ) 10 MG tablet Take 1 tablet (10 mg total) by mouth 3 (three) times daily. 90 tablet 2   spironolactone  (ALDACTONE ) 50 MG tablet Take 1 tablet (50 mg total) by mouth daily. 30 tablet 6   sucralfate  (CARAFATE ) 1 g tablet Take 1 tablet (1 g total) by mouth 4 (four) times daily -  with meals and at bedtime. 120 tablet 3   ursodiol  (ACTIGALL ) 500 MG tablet Take 2 tablets by mouth 2 (two) times daily.     No current facility-administered medications for this visit.    REVIEW OF SYSTEMS:  Constitutional: ( - ) fevers, ( - )  chills , ( - ) night sweats Eyes: ( - ) blurriness of vision, ( - ) double vision, ( - ) watery eyes Ears, nose, mouth, throat, and face: ( - ) mucositis, ( - ) sore throat Respiratory: ( - ) cough, ( - ) dyspnea, ( - ) wheezes Cardiovascular: ( - ) palpitation, ( - ) chest discomfort, ( - ) lower extremity swelling Gastrointestinal:  ( - ) nausea, ( - ) heartburn, ( - ) change in bowel habits Skin: ( - ) abnormal skin rashes Lymphatics: ( - ) new lymphadenopathy, ( - ) easy bruising Neurological: ( - ) numbness, ( - ) tingling, ( - ) new weaknesses Behavioral/Psych: ( - ) mood change, ( - ) new changes  All other systems were reviewed with the patient and are negative.  PHYSICAL EXAMINATION: ECOG PERFORMANCE STATUS: 1 - Symptomatic but completely ambulatory  Vitals:   05/24/24 1028  BP: (!) 106/52  Pulse: 82  Resp: 16  Temp: (!) 97.5 F (36.4 C)  SpO2: 97%    Filed Weights   05/24/24 1028  Weight: 175 lb 11.2 oz (79.7 kg)     GENERAL: well appearing  alert, no distress and comfortable SKIN: skin color, texture, turgor are normal, no rashes or significant lesions EYES: conjunctiva are pink and non-injected, sclera clear LUNGS: clear to auscultation and percussion with normal breathing effort HEART: regular rate & rhythm and no murmurs and no lower extremity edema Musculoskeletal: no cyanosis of digits and no clubbing  PSYCH: alert & oriented x 3,  fluent speech NEURO: no focal motor/sensory deficits  LABORATORY DATA:  I have reviewed the data as listed    Latest Ref Rng & Units 05/24/2024   10:00 AM 04/30/2024    3:39 PM 03/28/2024   12:23 PM  CBC  WBC 4.0 - 10.5 K/uL 6.4  4.8  4.9   Hemoglobin 12.0 - 15.0 g/dL 9.2  9.0  89.5   Hematocrit 36.0 - 46.0 % 31.4  28.4  32.0   Platelets 150 - 400 K/uL 210  199.0  183.0        Latest Ref Rng & Units 05/24/2024   10:00 AM 04/30/2024    3:39 PM 03/28/2024   12:23 PM  CMP  Glucose 70 - 99 mg/dL 841  88  880   BUN 8 - 23 mg/dL 10  8  8    Creatinine 0.44 - 1.00 mg/dL 9.27  9.47  9.40   Sodium 135 - 145 mmol/L 133  135  136   Potassium 3.5 - 5.1 mmol/L 3.7  3.9  3.5   Chloride 98 - 111 mmol/L 95  95  96   CO2 22 - 32 mmol/L 35  35  34   Calcium  8.9 - 10.3 mg/dL 8.4  8.1  8.1   Total Protein 6.5 - 8.1 g/dL 7.5  6.9  6.8   Total Bilirubin 0.0 - 1.2 mg/dL 0.4  0.5  0.4   Alkaline Phos 38 - 126 U/L 144  125  139   AST 15 - 41 U/L 24  24  29    ALT 0 - 44 U/L 12  12  14      RADIOGRAPHIC STUDIES: No results found.  ASSESSMENT & PLAN Sierra Logan 68 y.o. female with medical history significant for splenic infarct and iron deficiency anemia who presents for a follow up visit.   After review the labs, the records,  schedule the patient the findings most consistent with a splenic infarct of unclear etiology and iron deficiency anemia.  The most likely etiology of the patient's iron deficiency anemia is GI bleeding.  She has established care with [facility] physicians who found she has primary biliary cholangitis, GAVE, and esophageal varices.   The patient has responded markedly well to PO iron therapy. Her Hgb has risen to normal levels and how her iron stores are improving. We will continue with PO therapy to improve her iron stores.    In regards to her splenic infarct the etiology is unclear but may be secondary to the splenomegaly due to cirrhosis.  We performed a hypercoagulable  work-up which was negative and therefore there is not any specific interventions required.  I would recommend continual symptom management.  Patient voiced understanding of this plan moving forward.  # Splenic Infarct  --hypercoagulable workup was negative, no evidence of a hypercoagulable disease --recommend best supportive care at this time  # Iron Deficiency Anemia of Unclear Etiology #Esophageal Varices in the Setting of Cirrhosis --most likely 2/2 to GI bleed, potentially oozing from the varices noted during prior GI workup --continue PO ferrous sulfate  325mg  daily with a source of vitamin C --patient currently connected with [facility] for management of her cirrhosis.  --last iron studies show Ferritin 42, Sat 24%. This is a solid improvement from prior -- Hgb 9.2, white blood cell 6.4, MCV 85.3, platelets 210 --RTC approximately 4 to 6 weeks after last dose of IV iron.  No orders of the defined types were placed in this encounter.    All questions were answered. The patient knows to call the clinic with any problems, questions or concerns.  A total of more than 45 minutes were spent on this encounter and over half of that time was spent on counseling and coordination of care as outlined above.   Norleen IVAR Kidney, MD Department of Hematology/Oncology [facility] Phone: [phone] Pager: [phone] Email: [email or web address]  05/27/2024 4:15 PM

## 2024-05-27 ENCOUNTER — Encounter: Payer: Self-pay | Admitting: Hematology and Oncology

## 2024-05-28 ENCOUNTER — Other Ambulatory Visit (HOSPITAL_COMMUNITY): Payer: Self-pay

## 2024-05-28 ENCOUNTER — Telehealth: Payer: Self-pay | Admitting: Pharmacy Technician

## 2024-05-28 NOTE — Telephone Encounter (Signed)
 Auth Submission: NO AUTH NEEDED Site of care: Site of care: [facility] Payer: [employer] ADVT Medication & CPT/J Code(s) submitted: Monoferric (Ferrci derisomaltose) [phone] Diagnosis Code: D50.9 Route of submission (phone, fax, portal):  Phone # Fax # Auth type: Buy/Bill PB Units/visits requested: X1 DOSE Reference number:  Approval from: 05/28/24 to 07/18/24

## 2024-05-29 LAB — METHYLMALONIC ACID, SERUM: Methylmalonic Acid, Quantitative: 204 nmol/L (ref 0–378)

## 2024-06-05 ENCOUNTER — Other Ambulatory Visit: Payer: Self-pay | Admitting: Gastroenterology

## 2024-06-10 DIAGNOSIS — R601 Generalized edema: Secondary | ICD-10-CM | POA: Diagnosis not present

## 2024-06-10 DIAGNOSIS — J90 Pleural effusion, not elsewhere classified: Secondary | ICD-10-CM | POA: Diagnosis not present

## 2024-06-19 ENCOUNTER — Encounter: Payer: Self-pay | Admitting: Gastroenterology

## 2024-06-19 NOTE — Progress Notes (Signed)
 Review of hepatology notes from [facility]. Patient having diuretics uptitrated which is very reasonable. She is not a current candidate for liver transplantation, but will have follow-up in 6 months. She is scheduled to have a triple phase liver protocol scan to rule out portal vein thrombosis, I agree with this being completed. There is recommendation to stop PPI. Greatly appreciate hepatology evaluation.  I agree with the hepatology recommendations other than the stoppage of the PPI.  She has had significant portal gastropathy as well as GAVE that continues to have bleeding and oozing and her endoscopic treatments to try to get this under control, with RFA and band ligation to the stomach causes ulceration and increased risk for bleeding.  In this instance, I strongly recommend that she continue on her current PPI dosing as well as her Carafate  dosing.  We need to find out when she is going to undergo her CT scan that was ordered as well.  I will forward this to my team so they can reach out to her.   Aloha Finner, MD [facility] Office # [phone]

## 2024-06-19 NOTE — Progress Notes (Signed)
 Left message on machine to call back

## 2024-06-20 NOTE — Progress Notes (Signed)
 Left message on machine to call back

## 2024-06-21 NOTE — Progress Notes (Signed)
 Left message on machine to call back  I have been unable to reach pt by phone- letter mailed

## 2024-06-26 ENCOUNTER — Encounter (HOSPITAL_COMMUNITY): Payer: Self-pay | Admitting: Gastroenterology

## 2024-06-26 ENCOUNTER — Telehealth: Payer: Self-pay

## 2024-06-26 NOTE — Progress Notes (Signed)
 Attempted to obtain medical history for pre op call via telephone, unable to reach at this time. HIPAA compliant voicemail message left requesting return call to pre surgical testing department.

## 2024-06-26 NOTE — Telephone Encounter (Signed)
 Procedure:Ablation upper GI Procedure date: 07/02/24 Procedure location: [facility] Arrival Time: 10:18 Spoke with the patient Y/N: N Any prep concerns? N  Has the patient obtained the prep from the pharmacy ? N Do you have a care partner and transportation: N Any additional concerns? N   I called patient on 3 different occasions and no answer. I left a detailed message and the office number in case patient has questions are concerns.

## 2024-06-27 NOTE — Telephone Encounter (Signed)
 The pt returned call and has been advised and instructed of appt

## 2024-06-27 NOTE — Telephone Encounter (Signed)
 Patient returning call Requesting a call back  Please advise  Thank you

## 2024-06-27 NOTE — Telephone Encounter (Signed)
 Thank you for letting us  know. Patty, At some point later this week please try to reach the patient if possible. GM

## 2024-06-27 NOTE — Telephone Encounter (Signed)
 Attempted to reach pt no answer and voice mail left. All information mailed and sent to My Chart

## 2024-06-30 ENCOUNTER — Other Ambulatory Visit: Payer: Self-pay | Admitting: Gastroenterology

## 2024-07-02 ENCOUNTER — Other Ambulatory Visit: Payer: Self-pay

## 2024-07-02 ENCOUNTER — Encounter (HOSPITAL_COMMUNITY): Payer: Self-pay | Admitting: Gastroenterology

## 2024-07-02 ENCOUNTER — Encounter (HOSPITAL_COMMUNITY): Admission: RE | Disposition: A | Payer: Self-pay | Source: Home / Self Care | Attending: Gastroenterology

## 2024-07-02 ENCOUNTER — Ambulatory Visit (HOSPITAL_COMMUNITY)
Admission: RE | Admit: 2024-07-02 | Discharge: 2024-07-02 | Disposition: A | Attending: Gastroenterology | Admitting: Gastroenterology

## 2024-07-02 ENCOUNTER — Ambulatory Visit (HOSPITAL_COMMUNITY): Admitting: Anesthesiology

## 2024-07-02 ENCOUNTER — Telehealth: Payer: Self-pay

## 2024-07-02 DIAGNOSIS — K2980 Duodenitis without bleeding: Secondary | ICD-10-CM | POA: Insufficient documentation

## 2024-07-02 DIAGNOSIS — Q2730 Arteriovenous malformation, site unspecified: Secondary | ICD-10-CM | POA: Insufficient documentation

## 2024-07-02 DIAGNOSIS — K31819 Angiodysplasia of stomach and duodenum without bleeding: Secondary | ICD-10-CM

## 2024-07-02 DIAGNOSIS — I10 Essential (primary) hypertension: Secondary | ICD-10-CM | POA: Insufficient documentation

## 2024-07-02 DIAGNOSIS — D509 Iron deficiency anemia, unspecified: Secondary | ICD-10-CM

## 2024-07-02 DIAGNOSIS — K766 Portal hypertension: Secondary | ICD-10-CM | POA: Insufficient documentation

## 2024-07-02 DIAGNOSIS — I8500 Esophageal varices without bleeding: Secondary | ICD-10-CM

## 2024-07-02 DIAGNOSIS — D500 Iron deficiency anemia secondary to blood loss (chronic): Secondary | ICD-10-CM | POA: Diagnosis not present

## 2024-07-02 DIAGNOSIS — I8510 Secondary esophageal varices without bleeding: Secondary | ICD-10-CM | POA: Diagnosis not present

## 2024-07-02 DIAGNOSIS — J9611 Chronic respiratory failure with hypoxia: Secondary | ICD-10-CM | POA: Diagnosis not present

## 2024-07-02 DIAGNOSIS — K3189 Other diseases of stomach and duodenum: Secondary | ICD-10-CM | POA: Insufficient documentation

## 2024-07-02 DIAGNOSIS — K743 Primary biliary cirrhosis: Secondary | ICD-10-CM

## 2024-07-02 DIAGNOSIS — F17210 Nicotine dependence, cigarettes, uncomplicated: Secondary | ICD-10-CM | POA: Insufficient documentation

## 2024-07-02 DIAGNOSIS — K219 Gastro-esophageal reflux disease without esophagitis: Secondary | ICD-10-CM | POA: Insufficient documentation

## 2024-07-02 HISTORY — PX: GI RADIOFREQUENCY ABLATION: SHX6807

## 2024-07-02 HISTORY — PX: ESOPHAGOGASTRODUODENOSCOPY: SHX5428

## 2024-07-02 LAB — GLUCOSE, CAPILLARY
Glucose-Capillary: 76 mg/dL (ref 70–99)
Glucose-Capillary: 68 mg/dL — ABNORMAL LOW (ref 70–99)

## 2024-07-02 SURGERY — EGD (ESOPHAGOGASTRODUODENOSCOPY)
Anesthesia: Monitor Anesthesia Care

## 2024-07-02 MED ORDER — SODIUM CHLORIDE 0.9 % IV SOLN: INTRAVENOUS | Status: DC

## 2024-07-02 MED ORDER — PROPOFOL 500 MG/50ML IV EMUL
INTRAVENOUS | Status: DC | PRN
  Administered 2024-07-02: 11:00:00 125 ug/kg/min via INTRAVENOUS
  Administered 2024-07-02: 11:00:00 60 mg via INTRAVENOUS

## 2024-07-02 MED ORDER — SUCRALFATE 1 G PO TABS: 1.0000 g | ORAL_TABLET | Freq: Three times a day (TID) | ORAL | 4 refills | Status: AC

## 2024-07-02 MED ORDER — LACTATED RINGERS IV SOLN: INTRAVENOUS | Status: DC | PRN

## 2024-07-02 MED ORDER — ACETYLCYSTEINE 20 % IN SOLN
RESPIRATORY_TRACT | Status: DC | PRN
  Administered 2024-07-02: 11:00:00 3 mL via ORAL

## 2024-07-02 MED ORDER — PANTOPRAZOLE SODIUM 40 MG PO TBEC: 40.0000 mg | DELAYED_RELEASE_TABLET | Freq: Two times a day (BID) | ORAL | 4 refills | Status: AC

## 2024-07-02 MED ORDER — ALUM & MAG HYDROXIDE-SIMETH 200-200-20 MG/5ML PO SUSP
30.0000 mL | Freq: Once | ORAL | Status: AC
  Administered 2024-07-02: 12:00:00 30 mL via ORAL
  Filled 2024-07-02: qty 30

## 2024-07-02 MED ORDER — ACETYLCYSTEINE 20 % IN SOLN
RESPIRATORY_TRACT | Status: AC
  Filled 2024-07-02: qty 4

## 2024-07-02 MED ORDER — ALBUMIN HUMAN 5 % IV SOLN
12.5000 g | Freq: Once | INTRAVENOUS | Status: AC
  Administered 2024-07-02: 12:00:00 12.5 g via INTRAVENOUS

## 2024-07-02 MED ORDER — ALBUMIN HUMAN 5 % IV SOLN
INTRAVENOUS | Status: AC
  Filled 2024-07-02: qty 250

## 2024-07-02 MED ORDER — HYOSCYAMINE SULFATE 0.125 MG SL SUBL
0.2500 mg | SUBLINGUAL_TABLET | Freq: Once | SUBLINGUAL | Status: AC
  Administered 2024-07-02: 12:00:00 0.25 mg via SUBLINGUAL
  Filled 2024-07-02: qty 2

## 2024-07-02 MED ORDER — PHENYLEPHRINE 80 MCG/ML (10ML) SYRINGE FOR IV PUSH (FOR BLOOD PRESSURE SUPPORT)
PREFILLED_SYRINGE | INTRAVENOUS | Status: DC | PRN
  Administered 2024-07-02: 11:00:00 80 ug via INTRAVENOUS
  Administered 2024-07-02: 12:00:00 120 ug via INTRAVENOUS
  Administered 2024-07-02 (×2): 80 ug via INTRAVENOUS

## 2024-07-02 MED ORDER — PROPOFOL 500 MG/50ML IV EMUL
INTRAVENOUS | Status: AC
  Filled 2024-07-02: qty 50

## 2024-07-02 MED ORDER — SODIUM CHLORIDE 0.9 % IV BOLUS: INTRAVENOUS | Status: AC | PRN

## 2024-07-02 NOTE — Transfer of Care (Signed)
 Immediate Anesthesia Transfer of Care Note  Patient: Sierra Logan  Procedure(s) Performed: EGD (ESOPHAGOGASTRODUODENOSCOPY) RADIOFREQUENCY ABLATION, UPPER GI TRACT, ENDOSCOPIC  Patient Location: Endoscopy Unit  Anesthesia Type:MAC  Level of Consciousness: drowsy and patient cooperative  Airway & Oxygen  Therapy: Patient Spontanous Breathing and Patient connected to face mask oxygen   Post-op Assessment: Report given to RN and Post -op Vital signs reviewed and stable  Post vital signs: Reviewed and stable  Last Vitals:  Vitals Value Taken Time  BP 112/33 07/02/24 11:45  Temp    Pulse 71 07/02/24 11:47  Resp 15 07/02/24 11:47  SpO2 100 % 07/02/24 11:47  Vitals shown include unfiled device data.  Last Pain:  Vitals:   07/02/24 1035  TempSrc: Temporal  PainSc: 0-No pain         Complications: No notable events documented.

## 2024-07-02 NOTE — Anesthesia Postprocedure Evaluation (Signed)
 Anesthesia Post Note  Patient: Sierra Logan  Procedure(s) Performed: EGD (ESOPHAGOGASTRODUODENOSCOPY) RADIOFREQUENCY ABLATION, UPPER GI TRACT, ENDOSCOPIC     Patient location during evaluation: Endoscopy Anesthesia Type: MAC Level of consciousness: oriented, awake and alert and awake Pain management: pain level controlled Vital Signs Assessment: post-procedure vital signs reviewed and stable Respiratory status: spontaneous breathing, nonlabored ventilation, respiratory function stable and patient connected to nasal cannula oxygen  Cardiovascular status: blood pressure returned to baseline and stable Postop Assessment: no headache, no backache and no apparent nausea or vomiting Anesthetic complications: no   No notable events documented.  Last Vitals:  Vitals:   07/02/24 1246 07/02/24 1250  BP: (!) 89/38 (!) 101/40  Pulse: 71 69  Resp: 15 16  Temp:    SpO2: (!) 89% 95%    Last Pain:  Vitals:   07/02/24 1220  TempSrc:   PainSc: 8                  [name]

## 2024-07-02 NOTE — Anesthesia Preprocedure Evaluation (Signed)
 Anesthesia Evaluation  Patient identified by MRN, date of birth, ID band Patient awake    Reviewed: Allergy & Precautions, NPO status , Patient's Chart, lab work & pertinent test results, reviewed documented beta blocker date and time   Airway Mallampati: III  TM Distance: >3 FB Neck ROM: Full    Dental  (+) Teeth Intact, Dental Advisory Given   Pulmonary Current Smoker   Pulmonary exam normal breath sounds clear to auscultation       Cardiovascular hypertension, Pt. on home beta blockers Normal cardiovascular exam Rhythm:Regular Rate:Normal     Neuro/Psych  Neuromuscular disease    GI/Hepatic ,GERD  Medicated,,(+) Cirrhosis   Esophageal Varices    Iron deficiency anemia, unspecified iron deficiency anemia type GAVE (gastric antral vascular ectasia) Varices of esophagus determined by endoscopy     Endo/Other  negative endocrine ROSdiabetes    Renal/GU negative Renal ROS     Musculoskeletal negative musculoskeletal ROS (+)    Abdominal   Peds  Hematology negative hematology ROS (+)   Anesthesia Other Findings Day of surgery medications reviewed with the patient.  Reproductive/Obstetrics                              Anesthesia Physical Anesthesia Plan  ASA: 4  Anesthesia Plan: MAC   Post-op Pain Management: Minimal or no pain anticipated   Induction: Intravenous  PONV Risk Score and Plan: 1 and TIVA and Treatment may vary due to age or medical condition  Airway Management Planned: Simple Face Mask and Natural Airway  Additional Equipment:   Intra-op Plan:   Post-operative Plan:   Informed Consent: I have reviewed the patients History and Physical, chart, labs and discussed the procedure including the risks, benefits and alternatives for the proposed anesthesia with the patient or authorized representative who has indicated his/her understanding and acceptance.      Dental advisory given  Plan Discussed with: CRNA  Anesthesia Plan Comments:          Anesthesia Quick Evaluation

## 2024-07-02 NOTE — Telephone Encounter (Signed)
-----   Message from Aloha Finner, MD sent at 07/02/2024 12:06 PM EST ----- Regarding: Followup Sierra Logan, This patient needs repeat upper endoscopy in 6 to 10 weeks. Further treatment for GAVE. Patient needs liver triple phase protocol abdomen for HCC screening to be performed. She can have that done next available. There have been plan for this to be done at [facility] but she never had that done.  Thanks. GM

## 2024-07-02 NOTE — Discharge Instructions (Signed)

## 2024-07-02 NOTE — Op Note (Signed)
 [facility] Patient Name: Sierra Logan Procedure Date: 07/02/2024 MRN: [id number] Attending MD: Aloha Finner , MD, [id number] Date of Birth: Oct 22, 1955 CSN: [id number] Age: 68 Admit Type: Outpatient Procedure:                Upper GI endoscopy Indications:              Iron deficiency anemia secondary to chronic blood                            loss, Arteriovenous malformation in the stomach,                            Esophageal varices, Watermelon stomach (GAVE                            syndrome) Providers:                Aloha Finner, MD, Randall Lines, RN, Lorrayne Kitty, Technician Referring MD:              Medicines:                Monitored Anesthesia Care Complications:            No immediate complications. Estimated Blood Loss:     Estimated blood loss was minimal. Procedure:                Pre-Anesthesia Assessment:                           - Prior to the procedure, a History and Physical                            was performed, and patient medications and                            allergies were reviewed. The patient's tolerance of                            previous anesthesia was also reviewed. The risks                            and benefits of the procedure and the sedation                            options and risks were discussed with the patient.                            All questions were answered, and informed consent                            was obtained. Prior Anticoagulants: The patient has                            taken no anticoagulant or  antiplatelet agents. ASA                            Grade Assessment: III - A patient with severe                            systemic disease. After reviewing the risks and                            benefits, the patient was deemed in satisfactory                            condition to undergo the procedure.                           After obtaining informed  consent, the endoscope was                            passed under direct vision. Throughout the                            procedure, the patient's blood pressure, pulse, and                            oxygen  saturations were monitored continuously. The                            GIF-H190 ([id number]) Olympus endoscope was introduced                            through the mouth, and advanced to the second part                            of duodenum. The upper GI endoscopy was                            accomplished without difficulty. The patient                            tolerated the procedure. Scope In: Scope Out: Findings:      No gross lesions were noted in the mid esophagus and in the distal       esophagus.      Grade I, grade II, grade III varices were found in the distal esophagus       and at the gastroesophageal junction. No evidence of high risk stigmata       at this time (she remains on beta-blockade).      The Z-line was irregular.      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      Mild, moderate portal hypertensive gastropathy was found in the entire       examined stomach. This time no evidence of active oozing from the PHG.      And angulation deformity was found in the gastric antrum (this has       developed over time as result of  her GAVE treatment).      Moderate, diffuse gastric antral vascular ectasia was present in the       gastric antrum, in the prepyloric region of the stomach and in the       pylorus. This appears somewhat improved from prior with no evidence of       active bleeding/oozing as she normally has. Focal radiofrequency       ablation of gastric antral vascular ectasia in the stomach was       performed. With the endoscope in place, the position and extent of the       abnormal mucosa and appropriate anatomic landmarks were noted. The       abnormal mucosa was irrigated with N-acetylcysteine  (Mucomyst ) 1% mixed        with water . Gastric contents were suctioned. The radiofrequency channel       ablation catheter was introduced through the endoscope working channel.       The endoscope with the ablation catheter was advanced to the areas of       abnormal mucosa. The endoscope with the channel ablation catheter was       positioned under direct visualization so that the catheter was placed in       contact with the surface of the abnormal mucosa. Energy was applied       twice at 12 J/cm2. Ablation was repeated in a likewise fashion to all       remaining visible abnormal mucosa. The channel ablation catheter was       then removed through the endoscope working channel, and the ablation       catheter was cleaned. The endoscope was left in place. The ablation zone       was cleaned of coagulative debris. The ablation catheter was reinserted       into the endoscope working channel. A second round of ablation was then       performed. Energy was applied twice at 12 J/cm2 to retreat the areas of       abnormal mucosa that had been treated with the first series of ablation.       The ablation catheter was removed through the endoscope working channel.       The areas where abnormal mucosa had been ablated were examined. Areas of       abnormal mucosa appeared completely ablated. The total number of energy       applications for all mucosal sites treated was 56. The endoscope was       then removed.      There is no endoscopic evidence of varices in the entire examined       stomach.      Patchy moderate inflammation characterized by erythema, friability and       granularity was found in the duodenal bulb, in the first portion of the       duodenum and in the second portion of the duodenum. Impression:               - No gross lesions in the mid esophagus and in the                            distal esophagus.                           - Grade I, grade II  and grade III esophageal                             varices found distally (no high risk stigmata                            today).                           - Z-line irregular.                           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                           - Portal hypertensive gastropathy (not bleeding                            today).                           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                           - Moderate gastric antral vascular ectasia                            (somewhat improved from prior). Treated with TTS                            radiofrequency ablation.                           - Duodenitis. Moderate Sedation:      Not Applicable - Patient had care per Anesthesia. Recommendation:           - The patient will be observed post-procedure,                            until all discharge criteria are met.                           - Discharge patient to home.                           - Patient has a contact number available for                            emergencies. The signs and symptoms of potential                            delayed complications were discussed with the                            patient.  Return to normal activities tomorrow.                            Written discharge instructions were provided to the                            patient.                           - Full liquid diet.                           - Continue twice daily PPI.                           - Continue Carafate  4 times daily x 1 month then                            twice daily.                           - Repeat EGD in 2 months for further treatment of                            GAVE. If she continues to have issues, we will need                            to consider cryoablation.                           - Liver CT triple phase protocol for [facility] screening                            and to evaluate if there is significant evidence of                             portal vein issues, that may be reason for patient                            to continue to have issues of GAVE.                           - Observe patient's clinical course.                           - Continue NSBB.                           - The findings and recommendations were discussed                            with the patient.                           - The findings and recommendations were discussed  with the patient's family. Procedure Code(s):        --- Professional ---                           [phone], Esophagogastroduodenoscopy, flexible,                            transoral; with control of bleeding, any method Diagnosis Code(s):        --- Professional ---                           K22.89, Other specified disease of esophagus                           I85.00, Esophageal varices without bleeding                           K31.819, Angiodysplasia of stomach and duodenum                            without bleeding                           K76.6, Portal hypertension                           K31.89, Other diseases of stomach and duodenum                           K29.80, Duodenitis without bleeding                           D50.0, Iron deficiency anemia secondary to blood                            loss (chronic) CPT copyright 2022 American Medical Association. All rights reserved. The codes documented in this report are preliminary and upon coder review may  be revised to meet current compliance requirements. Aloha Finner, MD 07/02/2024 11:56:03 AM Number of Addenda: 0

## 2024-07-02 NOTE — Progress Notes (Signed)
 Anasthesia is ok to discharge with pressure 101/40 after albumin  infusion.  Pt instructed to use home o2 as needed to keep oxygen  sats above 90%.

## 2024-07-02 NOTE — H&P (Signed)
 GASTROENTEROLOGY PROCEDURE H&P NOTE   Primary Care Physician: Rollene Almarie LABOR, MD  HPI: Sierra Logan is a 68 y.o. female who presents for EGD for followup GAVE and treatment.  Past Medical History:  Diagnosis Date   Cirrhosis (HCC)    Diabetes mellitus without complication (HCC)    Esophageal varices (HCC)    GAVE (gastric antral vascular ectasia)    Hyperlipemia    Hypertension    IDA (iron deficiency anemia)    Neuropathy    Spleen disease    Past Surgical History:  Procedure Laterality Date   BIOPSY  10/20/2020   Procedure: BIOPSY;  Surgeon: Wilhelmenia Aloha Raddle., MD;  Location: [facility];  Service: Gastroenterology;;   COLONOSCOPY WITH PROPOFOL  N/A 10/20/2020   Procedure: COLONOSCOPY WITH PROPOFOL ;  Surgeon: Wilhelmenia Aloha Raddle., MD;  Location: [facility];  Service: Gastroenterology;  Laterality: N/A;   ESOPHAGOGASTRODUODENOSCOPY N/A 11/09/2023   Procedure: EGD (ESOPHAGOGASTRODUODENOSCOPY);  Surgeon: Nandigam, Kavitha V, MD;  Location: [facility] ENDOSCOPY;  Service: Gastroenterology;  Laterality: N/A;   ESOPHAGOGASTRODUODENOSCOPY N/A 12/22/2023   Procedure: EGD (ESOPHAGOGASTRODUODENOSCOPY);  Surgeon: Suzann Inocente HERO, MD;  Location: [facility] ENDOSCOPY;  Service: Gastroenterology;  Laterality: N/A;   ESOPHAGOGASTRODUODENOSCOPY N/A 02/14/2024   Procedure: EGD (ESOPHAGOGASTRODUODENOSCOPY);  Surgeon: Wilhelmenia Aloha Raddle., MD;  Location: [facility];  Service: Gastroenterology;  Laterality: N/A;   ESOPHAGOGASTRODUODENOSCOPY N/A 03/01/2024   Procedure: EGD (ESOPHAGOGASTRODUODENOSCOPY);  Surgeon: Wilhelmenia Aloha Raddle., MD;  Location: [facility];  Service: Gastroenterology;  Laterality: N/A;   ESOPHAGOGASTRODUODENOSCOPY N/A 05/16/2024   Procedure: EGD (ESOPHAGOGASTRODUODENOSCOPY);  Surgeon: Wilhelmenia Aloha Raddle., MD;  Location: [facility];  Service: Gastroenterology;  Laterality: N/A;   ESOPHAGOGASTRODUODENOSCOPY (EGD) WITH PROPOFOL  N/A 10/20/2020   Procedure:  ESOPHAGOGASTRODUODENOSCOPY (EGD) WITH PROPOFOL ;  Surgeon: Wilhelmenia Aloha Raddle., MD;  Location: [facility];  Service: Gastroenterology;  Laterality: N/A;   GI RADIOFREQUENCY ABLATION  02/14/2024   Procedure: RADIOFREQUENCY ABLATION, UPPER GI TRACT, ENDOSCOPIC;  Surgeon: Wilhelmenia Aloha Raddle., MD;  Location: [facility];  Service: Gastroenterology;;   GI RADIOFREQUENCY ABLATION  03/01/2024   Procedure: RADIOFREQUENCY ABLATION, UPPER GI TRACT, ENDOSCOPIC;  Surgeon: Wilhelmenia Aloha Raddle., MD;  Location: [facility];  Service: Gastroenterology;;   GI RADIOFREQUENCY ABLATION N/A 05/16/2024   Procedure: RADIOFREQUENCY ABLATION, UPPER GI TRACT, ENDOSCOPIC;  Surgeon: Wilhelmenia Aloha Raddle., MD;  Location: [facility];  Service: Gastroenterology;  Laterality: N/A;   HOT HEMOSTASIS N/A 11/09/2023   Procedure: EGD, WITH ARGON PLASMA COAGULATION;  Surgeon: Shila Gustav GAILS, MD;  Location: [facility];  Service: Gastroenterology;  Laterality: N/A;   HOT HEMOSTASIS N/A 12/22/2023   Procedure: EGD, WITH ARGON PLASMA COAGULATION;  Surgeon: Suzann Inocente HERO, MD;  Location: [facility] ENDOSCOPY;  Service: Gastroenterology;  Laterality: N/A;   INTRAOPERATIVE TRANSTHORACIC ECHOCARDIOGRAM  10/17/2020   Procedure: TRANSTHORACIC ECHOCARDIOGRAM;  Surgeon: Okey Vina GAILS, MD;  Location: [facility] END[facility]COPY;  Service: Cardiovascular;;   IR PARACENTESIS  11/10/2023   IR THORACENTESIS ASP PLEURAL SPACE W/IMG GUIDE  11/21/2023   POLYPECTOMY  10/20/2020   Procedure: POLYPECTOMY;  Surgeon: Wilhelmenia Aloha Raddle., MD;  Location: [facility];  Service: Gastroenterology;;   RADIOFREQUENCY ABLATION  10/20/2020   Procedure: RADIO FREQUENCY ABLATION;  Surgeon: Wilhelmenia Aloha Raddle., MD;  Location: [facility];  Service: Gastroenterology;;   RECONSTRUCTION OF NOSE     TRACHEOSTOMY     TRACHEOSTOMY CLOSURE     Current Facility-Administered Medications  Medication Dose Route Frequency Provider Last Rate Last Admin   0.9 %  sodium  chloride infusion   Intravenous Continuous Mansouraty, Aloha  Mickey., MD       sodium chloride  0.9 % bolus    Continuous PRN Corinne Garnette BRAVO, MD 900 mL/hr at 07/02/24 1041 200 mL at 07/02/24 1041   Current Medications[1] Allergies[2] Family History  Problem Relation Age of Onset   GER disease Mother    Heart disease Mother    Diabetes Father    Hypertension Sister    Hypertension Brother    Hypertension Brother    Hypertension Brother    Hypertension Brother    Prostate cancer Maternal Grandfather    Colon cancer Neg Hx    Esophageal cancer Neg Hx    Kidney disease Neg Hx    Liver disease Neg Hx    Pancreatic cancer Neg Hx    Rectal cancer Neg Hx    Stomach cancer Neg Hx    Inflammatory bowel disease Neg Hx    Social History   Socioeconomic History   Marital status: Married    Spouse name: [name]   Number of children: 0   Years of education: Not on file   Highest education level: Not on file  Occupational History   Occupation: RETIRED/part time courier  Tobacco Use   Smoking status: Some Days    Current packs/day: 0.50    Average packs/day: 0.5 packs/day for 57.0 years (28.5 ttl pk-yrs)    Types: Cigarettes    Start date: 07/20/1967   Smokeless tobacco: Never   Tobacco comments:    smokes 1 per day.  Vaping Use   Vaping status: Never Used  Substance and Sexual Activity   Alcohol use: Not Currently   Drug use: Never   Sexual activity: Not on file  Other Topics Concern   Not on file  Social History Narrative   Lives with husband/2025   Social Drivers of Health   Tobacco Use: High Risk (07/02/2024)   Patient History    Smoking Tobacco Use: Some Days    Smokeless Tobacco Use: Never    Passive Exposure: Not on file  Financial Resource Strain: Low Risk (03/30/2024)   Overall Financial Resource Strain (CARDIA)    Difficulty of Paying Living Expenses: Not very hard  Food Insecurity: Low Risk (05/01/2024)   Received from [facility]   Epic    Within the past 12  months, you worried that your food would run out before you got money to buy more: Never true    Within the past 12 months, the food you bought just didn't last and you didn't have money to get more. : Never true  Transportation Needs: No Transportation Needs (05/01/2024)   Received from [facility]    In the past 12 months, has lack of reliable transportation kept you from medical appointments, meetings, work or from getting things needed for daily living? : No  Physical Activity: Inactive (03/30/2024)   Exercise Vital Sign    Days of Exercise per Week: 0 days    Minutes of Exercise per Session: 0 min  Stress: No Stress Concern Present (03/30/2024)   [employer] of Occupational Health - Occupational Stress Questionnaire    Feeling of Stress: Not at all  Social Connections: Moderately Isolated (03/30/2024)   Social Connection and Isolation Panel    Frequency of Communication with Friends and Family: Twice a week    Frequency of Social Gatherings with Friends and Family: Once a week    Attends Religious Services: Never    Database Administrator or Organizations: No  Attends Banker Meetings: Never    Marital Status: Married  Catering Manager Violence: Not At Risk (03/30/2024)   Epic    Fear of Current or Ex-Partner: No    Emotionally Abused: No    Physically Abused: No    Sexually Abused: No  Depression (PHQ2-9): Low Risk (03/30/2024)   Depression (PHQ2-9)    PHQ-2 Score: 2  Alcohol Screen: Low Risk (03/30/2024)   Alcohol Screen    Last Alcohol Screening Score (AUDIT): 0  Housing: Medium Risk (05/01/2024)   Received from [facility]   Epic    What is your living situation today?: I have a steady place to live    Think about the place you live. Do you have problems with any of the following? Choose all that apply:: Pests such as bugs, ants, or mice  Utilities: Low Risk (05/01/2024)   Received from [facility]   Utilities    In the past 12  months has the electric, gas, oil, or water  company threatened to shut off services in your home? : No  Health Literacy: Adequate Health Literacy (03/30/2024)   B1300 Health Literacy    Frequency of need for help with medical instructions: Never    Physical Exam: Today's Vitals   07/02/24 1035  BP: (!) 100/33  Pulse: 73  Resp: 19  Temp: 97.6 F (36.4 C)  TempSrc: Temporal  SpO2: 95%  Weight: 85.7 kg  Height: 5' 2 (1.575 m)  PainSc: 0-No pain   Body mass index is 34.57 kg/m. GEN: NAD EYE: Sclerae anicteric ENT: MMM CV: Non-tachycardic GI: 4/10 pain preprocedure NEURO:  Alert & Oriented x 3  Lab Results: No results for input(s): WBC, HGB, HCT, PLT in the last 72 hours. BMET No results for input(s): NA, K, CL, CO2, GLUCOSE, BUN, CREATININE, CALCIUM  in the last 72 hours. LFT No results for input(s): PROT, ALBUMIN , AST, ALT, ALKPHOS, BILITOT, BILIDIR, IBILI in the last 72 hours. PT/INR No results for input(s): LABPROT, INR in the last 72 hours.   Impression / Plan: This is a 68 y.o.female who presents for EGD for followup GAVE and treatment.  The risks and benefits of endoscopic evaluation/treatment were discussed with the patient and/or family; these include but are not limited to the risk of perforation, infection, bleeding, missed lesions, lack of diagnosis, severe illness requiring hospitalization, as well as anesthesia and sedation related illnesses.  The patient's history has been reviewed, patient examined, no change in status, and deemed stable for procedure.  The patient and/or family was provided an opportunity to ask questions and all were answered.  The patient and/or family is agreeable to proceed.    Aloha Finner, MD [facility] Office # [phone]     [1]  Current Facility-Administered Medications:    0.9 %  sodium chloride  infusion, , Intravenous, Continuous, Mansouraty,  Aloha Raddle., MD   sodium chloride  0.9 % bolus, , , Continuous PRN, Corinne Garnette BRAVO, MD, Last Rate: 900 mL/hr at 07/02/24 1041, 200 mL at 07/02/24 1041 [2] No Known Allergies

## 2024-07-03 ENCOUNTER — Other Ambulatory Visit: Payer: Self-pay

## 2024-07-03 DIAGNOSIS — K31819 Angiodysplasia of stomach and duodenum without bleeding: Secondary | ICD-10-CM

## 2024-07-03 NOTE — Telephone Encounter (Signed)
 CT Triple phase.

## 2024-07-03 NOTE — Telephone Encounter (Signed)
 Left message on machine to call back

## 2024-07-03 NOTE — Telephone Encounter (Signed)
 Dr Wilhelmenia do you want CT or MRI?

## 2024-07-03 NOTE — Telephone Encounter (Signed)
 EGD has been set up for 08/20/24 at 1230 pm at [facility] with [facility]

## 2024-07-03 NOTE — Telephone Encounter (Signed)
CT order has been entered and sent to the schedulers

## 2024-07-10 ENCOUNTER — Inpatient Hospital Stay: Attending: Hematology and Oncology

## 2024-07-10 ENCOUNTER — Other Ambulatory Visit: Payer: Self-pay | Admitting: Hematology and Oncology

## 2024-07-10 DIAGNOSIS — D735 Infarction of spleen: Secondary | ICD-10-CM | POA: Diagnosis present

## 2024-07-10 DIAGNOSIS — F17210 Nicotine dependence, cigarettes, uncomplicated: Secondary | ICD-10-CM | POA: Insufficient documentation

## 2024-07-10 DIAGNOSIS — Z809 Family history of malignant neoplasm, unspecified: Secondary | ICD-10-CM | POA: Insufficient documentation

## 2024-07-10 DIAGNOSIS — D500 Iron deficiency anemia secondary to blood loss (chronic): Secondary | ICD-10-CM

## 2024-07-10 DIAGNOSIS — D509 Iron deficiency anemia, unspecified: Secondary | ICD-10-CM | POA: Insufficient documentation

## 2024-07-10 LAB — CBC WITH DIFFERENTIAL (CANCER CENTER ONLY)
Abs Immature Granulocytes: 0.02 K/uL (ref 0.00–0.07)
Basophils Absolute: 0.1 K/uL (ref 0.0–0.1)
Basophils Relative: 1 %
Eosinophils Absolute: 0 K/uL (ref 0.0–0.5)
Eosinophils Relative: 0 %
HCT: 28.7 % — ABNORMAL LOW (ref 36.0–46.0)
Hemoglobin: 8.5 g/dL — ABNORMAL LOW (ref 12.0–15.0)
Immature Granulocytes: 0 %
Lymphocytes Relative: 18 %
Lymphs Abs: 1.3 K/uL (ref 0.7–4.0)
MCH: 24.6 pg — ABNORMAL LOW (ref 26.0–34.0)
MCHC: 29.6 g/dL — ABNORMAL LOW (ref 30.0–36.0)
MCV: 82.9 fL (ref 80.0–100.0)
Monocytes Absolute: 0.7 K/uL (ref 0.1–1.0)
Monocytes Relative: 10 %
Neutro Abs: 5.2 K/uL (ref 1.7–7.7)
Neutrophils Relative %: 71 %
Platelet Count: 201 K/uL (ref 150–400)
RBC: 3.46 MIL/uL — ABNORMAL LOW (ref 3.87–5.11)
RDW: 16.2 % — ABNORMAL HIGH (ref 11.5–15.5)
WBC Count: 7.3 K/uL (ref 4.0–10.5)
nRBC: 0 % (ref 0.0–0.2)

## 2024-07-10 LAB — CMP (CANCER CENTER ONLY)
ALT: 14 U/L (ref 0–44)
AST: 32 U/L (ref 15–41)
Albumin: 2.9 g/dL — ABNORMAL LOW (ref 3.5–5.0)
Alkaline Phosphatase: 166 U/L — ABNORMAL HIGH (ref 38–126)
Anion gap: 5 (ref 5–15)
BUN: 8 mg/dL (ref 8–23)
CO2: 33 mmol/L — ABNORMAL HIGH (ref 22–32)
Calcium: 8.5 mg/dL — ABNORMAL LOW (ref 8.9–10.3)
Chloride: 96 mmol/L — ABNORMAL LOW (ref 98–111)
Creatinine: 0.61 mg/dL (ref 0.44–1.00)
GFR, Estimated: >60 mL/min
Glucose, Bld: 119 mg/dL — ABNORMAL HIGH (ref 70–99)
Potassium: 3.8 mmol/L (ref 3.5–5.1)
Sodium: 134 mmol/L — ABNORMAL LOW (ref 135–145)
Total Bilirubin: 0.6 mg/dL (ref 0.0–1.2)
Total Protein: 7.5 g/dL (ref 6.5–8.1)

## 2024-07-10 LAB — IRON AND IRON BINDING CAPACITY (CC-WL,HP ONLY)
Iron: 256 ug/dL — ABNORMAL HIGH (ref 28–170)
Saturation Ratios: 70 % — ABNORMAL HIGH (ref 10.4–31.8)
TIBC: 365 ug/dL (ref 250–450)
UIBC: 109 ug/dL

## 2024-07-10 LAB — FERRITIN: Ferritin: 48 ng/mL (ref 11–307)

## 2024-07-16 ENCOUNTER — Other Ambulatory Visit: Payer: Self-pay | Admitting: Gastroenterology

## 2024-07-16 NOTE — Progress Notes (Unsigned)
 " [facility] [phone]   [phone]  PROGRESS NOTE  Patient Care Team: Rollene Almarie LABOR, MD as PCP - [facility], [facility]  Hematological/Oncological History # Splenic Infarct  08/27/2020: patient presented to [facility] ED with abdominal pain. CT abdomen showed advanced cirrhosis of the liver. Splenomegaly. 4-5 cm hypoperfused area in the anterior superior corner of the spleen presumably relating to a splenic infarction  #Iron Deficiency Anemia of Unclear Etiology, Likely GI Bleed 08/27/2020: WBC 11.4, Hgb 8.1, MCV 75.6, Plt 196 08/28/2020: Iron 20, TIBC 466, Sat 4%, Ferritin 8 12/01/2020: WBC 8.3, Hgb 13.1, MCV 87.8, Plt 156. Ferritin 27.2 02/19/2021: Hgb 13.0, Iron 82, Iron sat 24%, ferritin 42  Interval History:  EVONE ARSENEAU 68 y.o. female with medical history significant for splenic infarct and iron deficiency anemia who presents for a follow up visit. The patient was last seen on 02/19/2021. In the interim since the last visit she has had no major changes in her health.  On exam today Ms.Kloeppel reports she has been well overall in the interim since our last visit.  She reports that she is continuing to take her iron pills though does cause some occasional constipation and stomach upset.  She reports that she is not currently having any lightheadedness dizziness or shortness of breath.  She reports is not having overt signs of bleeding, bruising, or dark stools.  She reports she is currently now using oxygen  at night.  She reports that she has had no evidence of dark bowel movements and does enjoy eating red meat such as hamburger and steaks.  She has had no recent infections such as runny nose, sore throat, cough.  She denies any fevers, chills, sweats.  She notes nothing else out of the ordinary in the interim since our last visit.  A full 10 point ROS is otherwise negative.  MEDICAL HISTORY:  Past Medical History:   Diagnosis Date   Cirrhosis (HCC)    Diabetes mellitus without complication (HCC)    Esophageal varices (HCC)    GAVE (gastric antral vascular ectasia)    Hyperlipemia    Hypertension    IDA (iron deficiency anemia)    Neuropathy    Spleen disease     SURGICAL HISTORY: Past Surgical History:  Procedure Laterality Date   BIOPSY  10/20/2020   Procedure: BIOPSY;  Surgeon: Wilhelmenia Aloha Raddle., MD;  Location: [facility];  Service: Gastroenterology;;   COLONOSCOPY WITH PROPOFOL  N/A 10/20/2020   Procedure: COLONOSCOPY WITH PROPOFOL ;  Surgeon: Wilhelmenia Aloha Raddle., MD;  Location: [facility];  Service: Gastroenterology;  Laterality: N/A;   ESOPHAGOGASTRODUODENOSCOPY N/A 11/09/2023   Procedure: EGD (ESOPHAGOGASTRODUODENOSCOPY);  Surgeon: Nandigam, Kavitha V, MD;  Location: [facility] END[facility]COPY;  Service: Gastroenterology;  Laterality: N/A;   ESOPHAGOGASTRODUODENOSCOPY N/A 12/22/2023   Procedure: EGD (ESOPHAGOGASTRODUODENOSCOPY);  Surgeon: Suzann Inocente HERO, MD;  Location: [facility] ENDOSCOPY;  Service: Gastroenterology;  Laterality: N/A;   ESOPHAGOGASTRODUODENOSCOPY N/A 02/14/2024   Procedure: EGD (ESOPHAGOGASTRODUODENOSCOPY);  Surgeon: Wilhelmenia Aloha Raddle., MD;  Location: [facility];  Service: Gastroenterology;  Laterality: N/A;   ESOPHAGOGASTRODUODENOSCOPY N/A 03/01/2024   Procedure: EGD (ESOPHAGOGASTRODUODENOSCOPY);  Surgeon: Wilhelmenia Aloha Raddle., MD;  Location: [facility];  Service: Gastroenterology;  Laterality: N/A;   ESOPHAGOGASTRODUODENOSCOPY N/A 05/16/2024   Procedure: EGD (ESOPHAGOGASTRODUODENOSCOPY);  Surgeon: Wilhelmenia Aloha Raddle., MD;  Location: [facility];  Service: Gastroenterology;  Laterality: N/A;   ESOPHAGOGASTRODUODENOSCOPY N/A 07/02/2024   Procedure: EGD (ESOPHAGOGASTRODUODENOSCOPY);  Surgeon: Wilhelmenia Aloha Raddle., MD;  Location: [facility];  Service: Gastroenterology;  Laterality: N/A;   ESOPHAGOGASTRODUODENOSCOPY (EGD) WITH PROPOFOL  N/A 10/20/2020   Procedure:  ESOPHAGOGASTRODUODENOSCOPY (EGD) WITH PROPOFOL ;  Surgeon: Wilhelmenia Aloha Raddle., MD;  Location: [facility];  Service: Gastroenterology;  Laterality: N/A;   GI RADIOFREQUENCY ABLATION  02/14/2024   Procedure: RADIOFREQUENCY ABLATION, UPPER GI TRACT, ENDOSCOPIC;  Surgeon: Wilhelmenia Aloha Raddle., MD;  Location: [facility];  Service: Gastroenterology;;   GI RADIOFREQUENCY ABLATION  03/01/2024   Procedure: RADIOFREQUENCY ABLATION, UPPER GI TRACT, ENDOSCOPIC;  Surgeon: Wilhelmenia Aloha Raddle., MD;  Location: [facility];  Service: Gastroenterology;;   GI RADIOFREQUENCY ABLATION N/A 05/16/2024   Procedure: RADIOFREQUENCY ABLATION, UPPER GI TRACT, ENDOSCOPIC;  Surgeon: Wilhelmenia Aloha Raddle., MD;  Location: [facility];  Service: Gastroenterology;  Laterality: N/A;   GI RADIOFREQUENCY ABLATION N/A 07/02/2024   Procedure: RADIOFREQUENCY ABLATION, UPPER GI TRACT, ENDOSCOPIC;  Surgeon: Wilhelmenia Aloha Raddle., MD;  Location: [facility];  Service: Gastroenterology;  Laterality: N/A;   HOT HEMOSTASIS N/A 11/09/2023   Procedure: EGD, WITH ARGON PLASMA COAGULATION;  Surgeon: Shila Gustav GAILS, MD;  Location: [facility];  Service: Gastroenterology;  Laterality: N/A;   HOT HEMOSTASIS N/A 12/22/2023   Procedure: EGD, WITH ARGON PLASMA COAGULATION;  Surgeon: Suzann Inocente HERO, MD;  Location: [facility] ENDOSCOPY;  Service: Gastroenterology;  Laterality: N/A;   INTRAOPERATIVE TRANSTHORACIC ECHOCARDIOGRAM  10/17/2020   Procedure: TRANSTHORACIC ECHOCARDIOGRAM;  Surgeon: Okey Vina GAILS, MD;  Location: [facility] ENDOSCOPY;  Service: Cardiovascular;;   IR PARACENTESIS  11/10/2023   IR THORACENTESIS ASP PLEURAL SPACE W/IMG GUIDE  11/21/2023   POLYPECTOMY  10/20/2020   Procedure: POLYPECTOMY;  Surgeon: Mansouraty, Aloha Raddle., MD;  Location: [facility];  Service: Gastroenterology;;   RADIOFREQUENCY ABLATION  10/20/2020   Procedure: RADIO FREQUENCY ABLATION;  Surgeon: Wilhelmenia Aloha Raddle., MD;  Location: [facility];   Service: Gastroenterology;;   RECONSTRUCTION OF NOSE     TRACHEOSTOMY     TRACHEOSTOMY CLOSURE      SOCIAL HISTORY: Social History   Socioeconomic History   Marital status: Married    Spouse name: [name]   Number of children: 0   Years of education: Not on file   Highest education level: Not on file  Occupational History   Occupation: RETIRED/part time courier  Tobacco Use   Smoking status: Some Days    Current packs/day: 0.50    Average packs/day: 0.5 packs/day for 57.0 years (28.5 ttl pk-yrs)    Types: Cigarettes    Start date: 07/20/1967   Smokeless tobacco: Never   Tobacco comments:    smokes 1 per day.  Vaping Use   Vaping status: Never Used  Substance and Sexual Activity   Alcohol use: Not Currently   Drug use: Never   Sexual activity: Not on file  Other Topics Concern   Not on file  Social History Narrative   Lives with husband/2025   Social Drivers of Health   Tobacco Use: High Risk (07/02/2024)   Patient History    Smoking Tobacco Use: Some Days    Smokeless Tobacco Use: Never    Passive Exposure: Not on file  Financial Resource Strain: Low Risk (03/30/2024)   Overall Financial Resource Strain (CARDIA)    Difficulty of Paying Living Expenses: Not very hard  Food Insecurity: Low Risk (05/01/2024)   Received from [facility]   Epic    Within the past 12 months, you worried that your food would run out before you got money to buy more: Never true    Within the past 12 months, the food  you bought just didn't last and you didn't have money to get more. : Never true  Transportation Needs: No Transportation Needs (05/01/2024)   Received from [facility]    In the past 12 months, has lack of reliable transportation kept you from medical appointments, meetings, work or from getting things needed for daily living? : No  Physical Activity: Inactive (03/30/2024)   Exercise Vital Sign    Days of Exercise per Week: 0 days    Minutes of Exercise per  Session: 0 min  Stress: No Stress Concern Present (03/30/2024)   [employer] of Occupational Health - Occupational Stress Questionnaire    Feeling of Stress: Not at all  Social Connections: Moderately Isolated (03/30/2024)   Social Connection and Isolation Panel    Frequency of Communication with Friends and Family: Twice a week    Frequency of Social Gatherings with Friends and Family: Once a week    Attends Religious Services: Never    Database Administrator or Organizations: No    Attends Banker Meetings: Never    Marital Status: Married  Catering Manager Violence: Not At Risk (03/30/2024)   Epic    Fear of Current or Ex-Partner: No    Emotionally Abused: No    Physically Abused: No    Sexually Abused: No  Depression (PHQ2-9): Low Risk (03/30/2024)   Depression (PHQ2-9)    PHQ-2 Score: 2  Alcohol Screen: Low Risk (03/30/2024)   Alcohol Screen    Last Alcohol Screening Score (AUDIT): 0  Housing: Medium Risk (05/01/2024)   Received from [facility]   Epic    What is your living situation today?: I have a steady place to live    Think about the place you live. Do you have problems with any of the following? Choose all that apply:: Pests such as bugs, ants, or mice  Utilities: Low Risk (05/01/2024)   Received from [facility]   Utilities    In the past 12 months has the electric, gas, oil, or water  company threatened to shut off services in your home? : No  Health Literacy: Adequate Health Literacy (03/30/2024)   B1300 Health Literacy    Frequency of need for help with medical instructions: Never    FAMILY HISTORY: Family History  Problem Relation Age of Onset   GER disease Mother    Heart disease Mother    Diabetes Father    Hypertension Sister    Hypertension Brother    Hypertension Brother    Hypertension Brother    Hypertension Brother    Prostate cancer Maternal Grandfather    Colon cancer Neg Hx    Esophageal cancer Neg Hx    Kidney disease  Neg Hx    Liver disease Neg Hx    Pancreatic cancer Neg Hx    Rectal cancer Neg Hx    Stomach cancer Neg Hx    Inflammatory bowel disease Neg Hx     ALLERGIES:  has no known allergies.  MEDICATIONS:  Current Outpatient Medications  Medication Sig Dispense Refill   acetaminophen  (TYLENOL ) 500 MG tablet Take 1 tablet (500 mg total) by mouth every 6 (six) hours as needed for mild pain (pain score 1-3) (or Fever >/= 101).     atorvastatin  (LIPITOR) 10 MG tablet Take 1 tablet (10 mg total) by mouth at bedtime. 90 tablet 3   BIOTIN PO Take 1 tablet by mouth daily with breakfast.     Calcium  Carb-Cholecalciferol (CALCIUM +D3  PO) Take 1 tablet by mouth daily after breakfast.     ferrous gluconate  (FERGON) 324 MG tablet Take 1 tablet by mouth once daily with breakfast 90 tablet 0   furosemide  (LASIX ) 40 MG tablet Take 1 tablet (40 mg total) by mouth daily. 90 tablet 1   gabapentin  (NEURONTIN ) 400 MG capsule TAKE 1 CAPSULE EVERY MORNING AND 2 AT BEDTIME 270 capsule 1   midodrine  (PROAMATINE ) 5 MG tablet Take 3 tablets (15 mg total) by mouth 3 (three) times daily with meals. 270 tablet 3   Omega-3 Fatty Acids (FISH OIL PO) Take 1 capsule by mouth every evening.     OXYGEN  Inhale 2 L into the lungs at bedtime. And prn     pantoprazole  (PROTONIX ) 40 MG tablet Take 1 tablet (40 mg total) by mouth 2 (two) times daily before a meal. 180 tablet 4   propranolol  (INDERAL ) 10 MG tablet Take 1 tablet (10 mg total) by mouth 3 (three) times daily. 90 tablet 2   spironolactone  (ALDACTONE ) 50 MG tablet Take 1 tablet (50 mg total) by mouth daily. 30 tablet 6   sucralfate  (CARAFATE ) 1 g tablet Take 1 tablet (1 g total) by mouth 4 (four) times daily -  with meals and at bedtime. 120 tablet 4   ursodiol  (ACTIGALL ) 500 MG tablet TAKE 2 TABLETS BY MOUTH IN THE MORNING AND 2 AT BEDTIME 120 tablet 0   No current facility-administered medications for this visit.    REVIEW OF SYSTEMS:   Constitutional: ( - ) fevers, (  - )  chills , ( - ) night sweats Eyes: ( - ) blurriness of vision, ( - ) double vision, ( - ) watery eyes Ears, nose, mouth, throat, and face: ( - ) mucositis, ( - ) sore throat Respiratory: ( - ) cough, ( - ) dyspnea, ( - ) wheezes Cardiovascular: ( - ) palpitation, ( - ) chest discomfort, ( - ) lower extremity swelling Gastrointestinal:  ( - ) nausea, ( - ) heartburn, ( - ) change in bowel habits Skin: ( - ) abnormal skin rashes Lymphatics: ( - ) new lymphadenopathy, ( - ) easy bruising Neurological: ( - ) numbness, ( - ) tingling, ( - ) new weaknesses Behavioral/Psych: ( - ) mood change, ( - ) new changes  All other systems were reviewed with the patient and are negative.  PHYSICAL EXAMINATION: ECOG PERFORMANCE STATUS: 1 - Symptomatic but completely ambulatory  There were no vitals filed for this visit.   There were no vitals filed for this visit.    GENERAL: well appearing  alert, no distress and comfortable SKIN: skin color, texture, turgor are normal, no rashes or significant lesions EYES: conjunctiva are pink and non-injected, sclera clear LUNGS: clear to auscultation and percussion with normal breathing effort HEART: regular rate & rhythm and no murmurs and no lower extremity edema Musculoskeletal: no cyanosis of digits and no clubbing  PSYCH: alert & oriented x 3, fluent speech NEURO: no focal motor/sensory deficits  LABORATORY DATA:  I have reviewed the data as listed    Latest Ref Rng & Units 07/10/2024   11:42 AM 05/24/2024   10:00 AM 04/30/2024    3:39 PM  CBC  WBC 4.0 - 10.5 K/uL 7.3  6.4  4.8   Hemoglobin 12.0 - 15.0 g/dL 8.5  9.2  9.0   Hematocrit 36.0 - 46.0 % 28.7  31.4  28.4   Platelets 150 - 400 K/uL 201  210  199.0        Latest Ref Rng & Units 07/10/2024   11:42 AM 05/24/2024   10:00 AM 04/30/2024    3:39 PM  CMP  Glucose 70 - 99 mg/dL 880  841  88   BUN 8 - 23 mg/dL 8  10  8    Creatinine 0.44 - 1.00 mg/dL 9.38  9.27  9.47   Sodium 135 - 145  mmol/L 134  133  135   Potassium 3.5 - 5.1 mmol/L 3.8  3.7  3.9   Chloride 98 - 111 mmol/L 96  95  95   CO2 22 - 32 mmol/L 33  35  35   Calcium  8.9 - 10.3 mg/dL 8.5  8.4  8.1   Total Protein 6.5 - 8.1 g/dL 7.5  7.5  6.9   Total Bilirubin 0.0 - 1.2 mg/dL 0.6  0.4  0.5   Alkaline Phos 38 - 126 U/L 166  144  125   AST 15 - 41 U/L 32  24  24   ALT 0 - 44 U/L 14  12  12      RADIOGRAPHIC STUDIES: No results found.  ASSESSMENT & PLAN ALANEA WOOLRIDGE 68 y.o. female with medical history significant for splenic infarct and iron deficiency anemia who presents for a follow up visit.   After review the labs, the records, schedule the patient the findings most consistent with a splenic infarct of unclear etiology and iron deficiency anemia.  The most likely etiology of the patient's iron deficiency anemia is GI bleeding.  She has established care with [facility] physicians who found she has primary biliary cholangitis, GAVE, and esophageal varices.   The patient has responded markedly well to PO iron therapy. Her Hgb has risen to normal levels and how her iron stores are improving. We will continue with PO therapy to improve her iron stores.    In regards to her splenic infarct the etiology is unclear but may be secondary to the splenomegaly due to cirrhosis.  We performed a hypercoagulable work-up which was negative and therefore there is not any specific interventions required.  I would recommend continual symptom management.  Patient voiced understanding of this plan moving forward.  # Splenic Infarct  --hypercoagulable workup was negative, no evidence of a hypercoagulable disease --recommend best supportive care at this time  # Iron Deficiency Anemia of Unclear Etiology #Esophageal Varices in the Setting of Cirrhosis --most likely 2/2 to GI bleed, potentially oozing from the varices noted during prior GI workup --continue PO ferrous sulfate  325mg  daily with a source of vitamin C --patient  currently connected with [facility] for management of her cirrhosis.  --last received IV feraheme  510 mg x 2 doses from 01/19/2024-01/26/2024 --Labs from 07/10/2024 showed worsening anemia with Hgb 8.5, MCV 82.9. Iron panel shows ferritin 48, TIBC 365, saturation 70%. --  No orders of the defined types were placed in this encounter.    All questions were answered. The patient knows to call the clinic with any problems, questions or concerns.   I have spent a total of {CHL ONC TIME VISIT - DTPQU:[id number]} minutes of face-to-face and non-face-to-face time, preparing to see the patient, obtaining and/or reviewing separately obtained history, performing a medically appropriate examination, counseling and educating the patient, ordering medications/tests/procedures, referring and communicating with other health care professionals, documenting clinical information in the electronic health record, independently interpreting results and communicating results to the patient, and care coordination.   Johnston Police PA-C Dept  of Hematology and Oncology [facility] Phone: [phone]   07/16/2024 11:39 PM "

## 2024-07-17 ENCOUNTER — Inpatient Hospital Stay (HOSPITAL_BASED_OUTPATIENT_CLINIC_OR_DEPARTMENT_OTHER): Admitting: Physician Assistant

## 2024-07-17 ENCOUNTER — Ambulatory Visit (HOSPITAL_COMMUNITY)
Admission: RE | Admit: 2024-07-17 | Discharge: 2024-07-17 | Disposition: A | Discharge status: Home / Self Care | Source: Ambulatory Visit | Attending: Gastroenterology | Admitting: Gastroenterology

## 2024-07-17 VITALS — BP 97/48 | HR 80 | Temp 97.5°F | Resp 18 | Wt 181.7 lb

## 2024-07-17 DIAGNOSIS — D500 Iron deficiency anemia secondary to blood loss (chronic): Secondary | ICD-10-CM | POA: Diagnosis not present

## 2024-07-17 DIAGNOSIS — K31819 Angiodysplasia of stomach and duodenum without bleeding: Secondary | ICD-10-CM | POA: Insufficient documentation

## 2024-07-17 DIAGNOSIS — D539 Nutritional anemia, unspecified: Secondary | ICD-10-CM | POA: Diagnosis not present

## 2024-07-17 DIAGNOSIS — D735 Infarction of spleen: Secondary | ICD-10-CM | POA: Diagnosis not present

## 2024-07-17 MED ORDER — IOHEXOL 300 MG/ML  SOLN
100.0000 mL | Freq: Once | INTRAMUSCULAR | Status: AC | PRN
  Administered 2024-07-17: 100 mL via INTRAVENOUS

## 2024-07-18 ENCOUNTER — Telehealth: Payer: Self-pay | Admitting: Hematology and Oncology

## 2024-07-20 ENCOUNTER — Encounter (HOSPITAL_COMMUNITY): Payer: Self-pay | Admitting: Hematology and Oncology

## 2024-07-20 ENCOUNTER — Inpatient Hospital Stay: Attending: Hematology and Oncology

## 2024-07-20 DIAGNOSIS — D539 Nutritional anemia, unspecified: Secondary | ICD-10-CM

## 2024-07-20 LAB — CBC WITH DIFFERENTIAL (CANCER CENTER ONLY)
Abs Immature Granulocytes: 0.01 K/uL (ref 0.00–0.07)
Basophils Absolute: 0.1 K/uL (ref 0.0–0.1)
Basophils Relative: 1 %
Eosinophils Absolute: 0 K/uL (ref 0.0–0.5)
Eosinophils Relative: 1 %
HCT: 27.4 % — ABNORMAL LOW (ref 36.0–46.0)
Hemoglobin: 8 g/dL — ABNORMAL LOW (ref 12.0–15.0)
Immature Granulocytes: 0 %
Lymphocytes Relative: 18 %
Lymphs Abs: 1.1 K/uL (ref 0.7–4.0)
MCH: 24.3 pg — ABNORMAL LOW (ref 26.0–34.0)
MCHC: 29.2 g/dL — ABNORMAL LOW (ref 30.0–36.0)
MCV: 83.3 fL (ref 80.0–100.0)
Monocytes Absolute: 0.8 K/uL (ref 0.1–1.0)
Monocytes Relative: 13 %
Neutro Abs: 4 K/uL (ref 1.7–7.7)
Neutrophils Relative %: 67 %
Platelet Count: 204 K/uL (ref 150–400)
RBC: 3.29 MIL/uL — ABNORMAL LOW (ref 3.87–5.11)
RDW: 17.5 % — ABNORMAL HIGH (ref 11.5–15.5)
WBC Count: 5.9 K/uL (ref 4.0–10.5)
nRBC: 0 % (ref 0.0–0.2)

## 2024-07-20 LAB — RETIC PANEL
Immature Retic Fract: 27.3 % — ABNORMAL HIGH (ref 2.3–15.9)
RBC.: 3.24 MIL/uL — ABNORMAL LOW (ref 3.87–5.11)
Retic Count, Absolute: 157.8 K/uL (ref 19.0–186.0)
Retic Ct Pct: 4.9 % — ABNORMAL HIGH (ref 0.4–3.1)
Reticulocyte Hemoglobin: 21.4 pg — ABNORMAL LOW

## 2024-07-20 LAB — SAMPLE TO BLOOD BANK

## 2024-07-20 LAB — LACTATE DEHYDROGENASE: LDH: 240 U/L — ABNORMAL HIGH (ref 105–235)

## 2024-07-23 LAB — KAPPA/LAMBDA LIGHT CHAINS
Kappa free light chain: 80.7 mg/L — ABNORMAL HIGH (ref 3.3–19.4)
Kappa, lambda light chain ratio: 0.94 (ref 0.26–1.65)
Lambda free light chains: 85.8 mg/L — ABNORMAL HIGH (ref 5.7–26.3)

## 2024-07-23 LAB — COPPER, SERUM: Copper: 175 ug/dL — ABNORMAL HIGH (ref 80–158)

## 2024-07-24 ENCOUNTER — Encounter: Payer: Self-pay | Admitting: Internal Medicine

## 2024-07-24 ENCOUNTER — Ambulatory Visit: Admitting: Internal Medicine

## 2024-07-24 VITALS — BP 98/60 | HR 82 | Temp 98.9°F | Ht 62.0 in | Wt 185.4 lb

## 2024-07-24 DIAGNOSIS — K743 Primary biliary cirrhosis: Secondary | ICD-10-CM | POA: Diagnosis not present

## 2024-07-24 DIAGNOSIS — I714 Abdominal aortic aneurysm, without rupture, unspecified: Secondary | ICD-10-CM | POA: Diagnosis not present

## 2024-07-24 DIAGNOSIS — K766 Portal hypertension: Secondary | ICD-10-CM | POA: Diagnosis not present

## 2024-07-24 DIAGNOSIS — D62 Acute posthemorrhagic anemia: Secondary | ICD-10-CM | POA: Diagnosis not present

## 2024-07-24 DIAGNOSIS — K3189 Other diseases of stomach and duodenum: Secondary | ICD-10-CM | POA: Diagnosis not present

## 2024-07-24 DIAGNOSIS — I8510 Secondary esophageal varices without bleeding: Secondary | ICD-10-CM | POA: Diagnosis not present

## 2024-07-24 DIAGNOSIS — Z720 Tobacco use: Secondary | ICD-10-CM

## 2024-07-24 DIAGNOSIS — J9611 Chronic respiratory failure with hypoxia: Secondary | ICD-10-CM | POA: Diagnosis not present

## 2024-07-24 DIAGNOSIS — J439 Emphysema, unspecified: Secondary | ICD-10-CM

## 2024-07-24 LAB — MULTIPLE MYELOMA PANEL, SERUM
Albumin SerPl Elph-Mcnc: 2.2 g/dL — ABNORMAL LOW (ref 2.9–4.4)
Albumin/Glob SerPl: 0.5 — ABNORMAL LOW (ref 0.7–1.7)
Alpha 1: 0.4 g/dL (ref 0.0–0.4)
Alpha2 Glob SerPl Elph-Mcnc: 0.8 g/dL (ref 0.4–1.0)
B-Globulin SerPl Elph-Mcnc: 1 g/dL (ref 0.7–1.3)
Gamma Glob SerPl Elph-Mcnc: 2.7 g/dL — ABNORMAL HIGH (ref 0.4–1.8)
Globulin, Total: 4.9 g/dL — ABNORMAL HIGH (ref 2.2–3.9)
IgA: 602 mg/dL — ABNORMAL HIGH (ref 87–352)
IgG (Immunoglobin G), Serum: 2432 mg/dL — ABNORMAL HIGH (ref 586–1602)
IgM (Immunoglobulin M), Srm: 488 mg/dL — ABNORMAL HIGH (ref 26–217)
Total Protein ELP: 7.1 g/dL (ref 6.0–8.5)

## 2024-07-24 NOTE — Progress Notes (Signed)
" ° °  Subjective:   Patient ID: Sierra Logan, female    DOB: Feb 04, 1956, 69 y.o.   MRN: [id number]  Discussed the use of AI scribe software for clinical note transcription with the patient, who gave verbal consent to proceed.  History of Present Illness Sierra Logan is a 69 year old female with emphysema who presents with fatigue and gastrointestinal symptoms.  She has been experiencing persistent fatigue with no significant changes noted. Her recent hematology visit showed slightly lower blood levels, and she continues to take iron supplements.  She uses oxygen  at night but not during the day. She recently had a CT scan of her lungs, and she was told that it looked about the same as before.  She reports gastrointestinal issues, including more frequent diarrhea, though she experienced constipation on the day of the visit. She also reports increased diffuse abdominal pain affecting the entire abdomen. A recent CT scan of her abdomen was performed, but results are pending.  She continues to smoke cigarettes occasionally. No changes in stool color, blood in stool, chest pain, or heart racing.  Review of Systems  Constitutional:  Positive for fatigue.  HENT: Negative.    Eyes: Negative.   Respiratory:  Negative for cough, chest tightness and shortness of breath.   Cardiovascular:  Negative for chest pain, palpitations and leg swelling.  Gastrointestinal:  Negative for abdominal distention, abdominal pain, constipation, diarrhea, nausea and vomiting.  Musculoskeletal: Negative.   Skin: Negative.   Neurological: Negative.   Psychiatric/Behavioral: Negative.      Objective:  Physical Exam Constitutional:      Appearance: She is well-developed.  HENT:     Head: Normocephalic and atraumatic.  Cardiovascular:     Rate and Rhythm: Normal rate and regular rhythm.  Pulmonary:     Effort: Pulmonary effort is normal. No respiratory distress.     Breath sounds: Normal breath sounds. No  wheezing or rales.  Abdominal:     General: Bowel sounds are normal. There is distension.     Palpations: Abdomen is soft.     Tenderness: There is no abdominal tenderness.  Musculoskeletal:     Cervical back: Normal range of motion.  Skin:    General: Skin is warm and dry.  Neurological:     Mental Status: She is alert and oriented to person, place, and time.     Coordination: Coordination normal.     Vitals:   07/24/24 1421  BP: 98/60  Pulse: 82  Temp: 98.9 F (37.2 C)  TempSrc: Oral  SpO2: 94%  Weight: 185 lb 6.4 oz (84.1 kg)  Height: 5' 2 (1.575 m)     "

## 2024-07-24 NOTE — Assessment & Plan Note (Signed)
 Noted on recent lung cancer screening CT scan. With chronic oxygen  therapy.

## 2024-07-24 NOTE — Patient Instructions (Signed)
 We will check on the scan of the stomach to see why it is taking so long for them to read.

## 2024-07-27 ENCOUNTER — Encounter: Payer: Self-pay | Admitting: Internal Medicine

## 2024-07-27 ENCOUNTER — Ambulatory Visit: Payer: Self-pay | Admitting: Gastroenterology

## 2024-07-27 ENCOUNTER — Other Ambulatory Visit: Payer: Self-pay

## 2024-07-27 DIAGNOSIS — I719 Aortic aneurysm of unspecified site, without rupture: Secondary | ICD-10-CM | POA: Insufficient documentation

## 2024-07-27 DIAGNOSIS — K743 Primary biliary cirrhosis: Secondary | ICD-10-CM

## 2024-07-27 DIAGNOSIS — D509 Iron deficiency anemia, unspecified: Secondary | ICD-10-CM

## 2024-07-27 DIAGNOSIS — K31819 Angiodysplasia of stomach and duodenum without bleeding: Secondary | ICD-10-CM

## 2024-07-27 NOTE — Assessment & Plan Note (Signed)
 Uses oxygen  at night time and will continue.

## 2024-07-27 NOTE — Assessment & Plan Note (Signed)
 No encephalopathy, stable ascites on exam. Continue to follow with GI.

## 2024-07-27 NOTE — Assessment & Plan Note (Signed)
 Encouraged to quit smoking. Up to date on lung cancer screening.

## 2024-07-27 NOTE — Assessment & Plan Note (Signed)
 Seeing GI and having EGD upcoming.

## 2024-07-27 NOTE — Assessment & Plan Note (Signed)
 With cirrhosis and seeing GI for treatment still.

## 2024-07-27 NOTE — Assessment & Plan Note (Signed)
 US  due Dec 2028

## 2024-07-27 NOTE — Assessment & Plan Note (Signed)
 Hemoglobin is slightly low, and she reports fatigue. She continues iron supplementation and is awaiting an abdominal CT review. Continue iron supplementation and follow up with the GI doctor for abdominal CT results.

## 2024-07-27 NOTE — Assessment & Plan Note (Signed)
 Seeing GI and getting EGD soon.

## 2024-07-30 ENCOUNTER — Other Ambulatory Visit: Payer: Self-pay | Admitting: Physician Assistant

## 2024-07-30 DIAGNOSIS — D539 Nutritional anemia, unspecified: Secondary | ICD-10-CM

## 2024-07-31 ENCOUNTER — Inpatient Hospital Stay

## 2024-08-02 NOTE — Telephone Encounter (Signed)
 Patient returning call Requesting a call back  Please advise  Thank you

## 2024-08-03 ENCOUNTER — Inpatient Hospital Stay

## 2024-08-03 ENCOUNTER — Telehealth: Payer: Self-pay

## 2024-08-03 DIAGNOSIS — D539 Nutritional anemia, unspecified: Secondary | ICD-10-CM

## 2024-08-03 LAB — CBC WITH DIFFERENTIAL (CANCER CENTER ONLY)
Abs Immature Granulocytes: 0.02 K/uL (ref 0.00–0.07)
Basophils Absolute: 0 K/uL (ref 0.0–0.1)
Basophils Relative: 1 %
Eosinophils Absolute: 0 K/uL (ref 0.0–0.5)
Eosinophils Relative: 0 %
HCT: 27.3 % — ABNORMAL LOW (ref 36.0–46.0)
Hemoglobin: 8 g/dL — ABNORMAL LOW (ref 12.0–15.0)
Immature Granulocytes: 0 %
Lymphocytes Relative: 16 %
Lymphs Abs: 0.9 K/uL (ref 0.7–4.0)
MCH: 23.7 pg — ABNORMAL LOW (ref 26.0–34.0)
MCHC: 29.3 g/dL — ABNORMAL LOW (ref 30.0–36.0)
MCV: 81 fL (ref 80.0–100.0)
Monocytes Absolute: 0.8 K/uL (ref 0.1–1.0)
Monocytes Relative: 13 %
Neutro Abs: 4.2 K/uL (ref 1.7–7.7)
Neutrophils Relative %: 70 %
Platelet Count: 223 K/uL (ref 150–400)
RBC: 3.37 MIL/uL — ABNORMAL LOW (ref 3.87–5.11)
RDW: 15.9 % — ABNORMAL HIGH (ref 11.5–15.5)
WBC Count: 6 K/uL (ref 4.0–10.5)
nRBC: 0 % (ref 0.0–0.2)

## 2024-08-03 LAB — IRON AND IRON BINDING CAPACITY (CC-WL,HP ONLY)
Iron: 79 ug/dL (ref 28–170)
Saturation Ratios: 25 % (ref 10.4–31.8)
TIBC: 318 ug/dL (ref 250–450)
UIBC: 239 ug/dL

## 2024-08-03 LAB — RETIC PANEL
Immature Retic Fract: 31.2 % — ABNORMAL HIGH (ref 2.3–15.9)
RBC.: 3.33 MIL/uL — ABNORMAL LOW (ref 3.87–5.11)
Retic Count, Absolute: 114.6 K/uL (ref 19.0–186.0)
Retic Ct Pct: 3.4 % — ABNORMAL HIGH (ref 0.4–3.1)
Reticulocyte Hemoglobin: 20.9 pg — ABNORMAL LOW

## 2024-08-03 LAB — SAMPLE TO BLOOD BANK

## 2024-08-03 LAB — FERRITIN: Ferritin: 23 ng/mL (ref 11–307)

## 2024-08-03 NOTE — Telephone Encounter (Signed)
 patient is lab only with a bb hold, hemoglobin is 8.0.   no need for blood IT  LM for pt with results and no need for blood

## 2024-08-07 ENCOUNTER — Ambulatory Visit: Payer: Self-pay | Admitting: Pharmacy Technician

## 2024-08-07 ENCOUNTER — Other Ambulatory Visit: Payer: Self-pay | Admitting: Physician Assistant

## 2024-08-07 ENCOUNTER — Telehealth: Payer: Self-pay | Admitting: Pharmacy Technician

## 2024-08-07 NOTE — Progress Notes (Signed)
 Johnston, Patient will be scheduled asap. Thanks.

## 2024-08-07 NOTE — Telephone Encounter (Signed)
-----   Message from Johnston [place] Police sent at 08/07/2024 12:13 PM EST ----- Ronal- Please notify patient that her iron labs show deficiency so we will arrange for IV iron to try to improve her anemia.   Kim-Can you try to get this patient in at [facility].   Thanks, Johnston

## 2024-08-07 NOTE — Telephone Encounter (Signed)
-----   Message from Johnston [place] Police sent at 08/07/2024 12:13 PM EST ----- Sierra Logan- Please notify patient that her iron labs show deficiency so we will arrange for IV iron to try to improve her anemia.   Sierra Logan-Can you try to get this patient in at [facility].   Thanks, Johnston

## 2024-08-07 NOTE — Telephone Encounter (Signed)
 TC to pt and left message requesting she return call to this clinic [phone].  Purpose of call is to forward information from Johnston Police, PA-C  Please notify patient that her iron labs show deficiency so we will arrange for IV iron to try to improve her anemia.

## 2024-08-07 NOTE — Telephone Encounter (Signed)
 ERROR

## 2024-08-08 ENCOUNTER — Telehealth: Payer: Self-pay | Admitting: Pharmacy Technician

## 2024-08-08 ENCOUNTER — Encounter: Payer: Self-pay | Admitting: Physician Assistant

## 2024-08-08 NOTE — Telephone Encounter (Addendum)
 Auth Submission: APPROVED Site of care: Site of care: [facility] Payer: DEVOTED Medication & CPT/J Code(s) submitted: Feraheme  (ferumoxytol ) [id number] Diagnosis Code: D50.9 Route of submission (phone, fax, portal):  Phone # Fax # Auth type: Buy/Bill PB Units/visits requested: 2 DOSES Reference number: [id number] Approval from: 08/08/24 to 11/15/24

## 2024-08-12 ENCOUNTER — Encounter: Payer: Self-pay | Admitting: Physician Assistant

## 2024-08-17 ENCOUNTER — Telehealth: Payer: Self-pay

## 2024-08-17 NOTE — Telephone Encounter (Signed)
 Inbound call from patient stating she would like to speak to nurse in regards to some questions she's having about upcoming procedure on 08/20/24 at [facility]  Requesting a call back  Please advise  Thank you

## 2024-08-17 NOTE — Telephone Encounter (Signed)
 Procedure:EGD Procedure date: 08/20/24 Procedure location: [facility] Arrival Time: 10:56 Spoke with the patient Y/N: N Any prep concerns? N Has the patient obtained the prep from the pharmacy ? N Do you have a care partner and transportation: N Any additional concerns? N  I called the patient and was unsuccessful. I left a detailed message of the procedure and the office number in case the patient has question and concerns

## 2024-08-17 NOTE — Telephone Encounter (Signed)
 All information mailed to the pt and sent to My Chart- she was also given info over the phone in Dec. 2025

## 2024-08-17 NOTE — Telephone Encounter (Signed)
 Spoke with the pt and discussed all questions for 2/2 procedure appt.  The pt has been advised of the information and verbalized understanding.

## 2024-08-20 ENCOUNTER — Ambulatory Visit (HOSPITAL_COMMUNITY): Admission: RE | Admit: 2024-08-20 | Admitting: Gastroenterology

## 2024-08-20 ENCOUNTER — Encounter (HOSPITAL_COMMUNITY): Admission: RE | Payer: Self-pay | Source: Home / Self Care

## 2024-08-21 ENCOUNTER — Other Ambulatory Visit: Payer: Self-pay | Admitting: Gastroenterology

## 2024-08-22 ENCOUNTER — Telehealth: Payer: Self-pay | Admitting: Gastroenterology

## 2024-08-22 NOTE — Telephone Encounter (Signed)
 Patient would like reschedule hospital colonoscopy from 2/2. Please advise, Thank you.

## 2024-08-22 NOTE — Telephone Encounter (Signed)
 Reschedule case # [id number]

## 2024-08-22 NOTE — Telephone Encounter (Signed)
 EGD has been rescheduled to 09/13/24 at noon at [facility]   The pt has been advised and new instructions sent via my Chart

## 2024-09-12 ENCOUNTER — Inpatient Hospital Stay

## 2024-09-12 ENCOUNTER — Inpatient Hospital Stay: Admitting: Hematology and Oncology

## 2024-09-13 ENCOUNTER — Ambulatory Visit (HOSPITAL_COMMUNITY): Admit: 2024-09-13 | Admitting: Gastroenterology

## 2024-09-13 ENCOUNTER — Encounter (HOSPITAL_COMMUNITY): Payer: Self-pay

## 2025-04-02 ENCOUNTER — Ambulatory Visit
